# Patient Record
Sex: Female | Born: 1940 | Race: White | Hispanic: No | Smoking: Never smoker
Health system: Southern US, Community
[De-identification: ages and names within clinical notes are randomized; demographics above are authoritative.]

## PROBLEM LIST (undated history)

## (undated) DIAGNOSIS — M6289 Other specified disorders of muscle: Secondary | ICD-10-CM

## (undated) DIAGNOSIS — Z831 Family history of other infectious and parasitic diseases: Secondary | ICD-10-CM

## (undated) DIAGNOSIS — N183 Chronic kidney disease, stage 3 (moderate): Secondary | ICD-10-CM

## (undated) DIAGNOSIS — C50919 Malignant neoplasm of unspecified site of unspecified female breast: Secondary | ICD-10-CM

## (undated) DIAGNOSIS — F419 Anxiety disorder, unspecified: Secondary | ICD-10-CM

## (undated) DIAGNOSIS — R809 Proteinuria, unspecified: Secondary | ICD-10-CM

## (undated) DIAGNOSIS — I779 Disorder of arteries and arterioles, unspecified: Secondary | ICD-10-CM

## (undated) DIAGNOSIS — Z8611 Personal history of tuberculosis: Secondary | ICD-10-CM

## (undated) DIAGNOSIS — W108XXA Fall (on) (from) other stairs and steps, initial encounter: Secondary | ICD-10-CM

## (undated) DIAGNOSIS — R002 Palpitations: Secondary | ICD-10-CM

## (undated) DIAGNOSIS — M549 Dorsalgia, unspecified: Secondary | ICD-10-CM

## (undated) DIAGNOSIS — E119 Type 2 diabetes mellitus without complications: Secondary | ICD-10-CM

## (undated) DIAGNOSIS — F32A Depression, unspecified: Secondary | ICD-10-CM

## (undated) DIAGNOSIS — J984 Other disorders of lung: Secondary | ICD-10-CM

## (undated) DIAGNOSIS — R252 Cramp and spasm: Secondary | ICD-10-CM

## (undated) DIAGNOSIS — Z8619 Personal history of other infectious and parasitic diseases: Secondary | ICD-10-CM

## (undated) DIAGNOSIS — J40 Bronchitis, not specified as acute or chronic: Secondary | ICD-10-CM

## (undated) DIAGNOSIS — S82899A Other fracture of unspecified lower leg, initial encounter for closed fracture: Secondary | ICD-10-CM

## (undated) DIAGNOSIS — Z90710 Acquired absence of both cervix and uterus: Secondary | ICD-10-CM

## (undated) DIAGNOSIS — F329 Major depressive disorder, single episode, unspecified: Secondary | ICD-10-CM

## (undated) DIAGNOSIS — E785 Hyperlipidemia, unspecified: Secondary | ICD-10-CM

## (undated) DIAGNOSIS — N200 Calculus of kidney: Secondary | ICD-10-CM

## (undated) DIAGNOSIS — M25561 Pain in right knee: Secondary | ICD-10-CM

## (undated) DIAGNOSIS — I639 Cerebral infarction, unspecified: Secondary | ICD-10-CM

## (undated) DIAGNOSIS — N289 Disorder of kidney and ureter, unspecified: Secondary | ICD-10-CM

## (undated) DIAGNOSIS — Z836 Family history of other diseases of the respiratory system: Secondary | ICD-10-CM

## (undated) DIAGNOSIS — C189 Malignant neoplasm of colon, unspecified: Secondary | ICD-10-CM

## (undated) DIAGNOSIS — R0609 Other forms of dyspnea: Secondary | ICD-10-CM

## (undated) DIAGNOSIS — Z923 Personal history of irradiation: Secondary | ICD-10-CM

## (undated) DIAGNOSIS — I10 Essential (primary) hypertension: Secondary | ICD-10-CM

## (undated) DIAGNOSIS — R0600 Dyspnea, unspecified: Secondary | ICD-10-CM

## (undated) HISTORY — DX: Family history of other diseases of the respiratory system: Z83.6

## (undated) HISTORY — DX: Other fracture of unspecified lower leg, initial encounter for closed fracture: S82.899A

## (undated) HISTORY — DX: Other forms of dyspnea: R06.09

## (undated) HISTORY — DX: Acquired absence of both cervix and uterus: Z90.710

## (undated) HISTORY — DX: Disorder of kidney and ureter, unspecified: N28.9

## (undated) HISTORY — DX: Fall (on) (from) other stairs and steps, initial encounter: W10.8XXA

## (undated) HISTORY — PX: BREAST LUMPECTOMY: SHX2

## (undated) HISTORY — DX: Dyspnea, unspecified: R06.00

## (undated) HISTORY — DX: Chronic kidney disease, stage 3 (moderate): N18.3

## (undated) HISTORY — PX: TONSILLECTOMY AND ADENOIDECTOMY: SUR1326

## (undated) HISTORY — DX: Type 2 diabetes mellitus without complications: E11.9

## (undated) HISTORY — DX: Major depressive disorder, single episode, unspecified: F32.9

## (undated) HISTORY — DX: Proteinuria, unspecified: R80.9

## (undated) HISTORY — DX: Other specified disorders of muscle: M62.89

## (undated) HISTORY — DX: Malignant neoplasm of colon, unspecified: C18.9

## (undated) HISTORY — DX: Palpitations: R00.2

## (undated) HISTORY — DX: Hyperlipidemia, unspecified: E78.5

## (undated) HISTORY — DX: Personal history of tuberculosis: Z86.11

## (undated) HISTORY — DX: Cerebral infarction, unspecified: I63.9

## (undated) HISTORY — DX: Malignant neoplasm of unspecified site of unspecified female breast: C50.919

## (undated) HISTORY — DX: Other disorders of lung: J98.4

## (undated) HISTORY — DX: Essential (primary) hypertension: I10

## (undated) HISTORY — DX: Cramp and spasm: R25.2

## (undated) HISTORY — DX: Dorsalgia, unspecified: M54.9

## (undated) HISTORY — DX: Personal history of other infectious and parasitic diseases: Z86.19

## (undated) HISTORY — PX: HYSTEROTOMY: SHX1776

## (undated) HISTORY — DX: Depression, unspecified: F32.A

## (undated) HISTORY — DX: Anxiety disorder, unspecified: F41.9

## (undated) HISTORY — DX: Calculus of kidney: N20.0

## (undated) HISTORY — DX: Pain in right knee: M25.561

## (undated) HISTORY — DX: Family history of other infectious and parasitic diseases: Z83.1

## (undated) HISTORY — DX: Bronchitis, not specified as acute or chronic: J40

## (undated) NOTE — *Deleted (*Deleted)
PMR Admission Coordinator Pre-Admission Assessment  Patient: [name] is an 47 y.o., female MRN: [id number] DOB: 08-29-1940 Height:   Weight: 88.7 kg  Insurance Information HMO: ***    PPO: ***     PCP: ***     IPA: ***     80/20: ***     OTHER: *** PRIMARY: U[employer] Medicare      Policy#: [id number]      Subscriber: pt CM Name: ***      Phone#: [phone]   Fax#: [phone] Pre-Cert#: ***      Employer:  Benefits:  Phone #: [phone]     Name:  Eff. Date: ***     Deduct: ***      Out of Pocket Max: ***      Life Max: n/a CIR: ***      SNF: *** Outpatient: ***     Co-Pay: *** Home Health: ***      Co-Pay: *** DME: ***     Co-Pay: *** Providers:  SECONDARY       Policy#:      Phone#:   Development worker, community:       Phone#:   The "Data Collection Information Summary" for patients in Inpatient Rehabilitation Facilities with attached "Privacy Act [place] Records" was provided and verbally reviewed with: Patient  Emergency Contact Information Contact Information    Name Relation Home Work Mobile   [name] Son [phone]        Current Medical History  Patient Admitting Diagnosis: encephalopathy   History of Present Illness: Pt is a 53 y/o female with PMH significant for DM II, HTN, CKD stage IV, CVA, depression/anxiety who was admitted to [facility] on 10/1 with acute encephalopathy, SIRS, AKI on CKD IV, and cholelithiasis.  With was started on broad-spectrum antibiotics while workup for infection underway.  On presentation, creatinine was 5.3 (baseline 1.97), CT head negative, CT chest revealed 4cm ascending aortic aneurysm and 2.4 cm L thyroid nodule.  RUQ ultrasound showed cholelithiasis with pericholecystic fluid, but no evidence on cholecystitis.  Nephrology consulted renal management, and cardiology consulted for PVCs versus non-sustained VT, felt related to her metabolic derangements.  Therapy evaluations were completed and pt was recommended for CIR.      Patient's medical record from [facility] has been reviewed by the rehabilitation admission coordinator and physician.  Past Medical History  Past Medical History:  Diagnosis Date  . Anxiety   . Back ache   . Breast cancer ([place])    right  . Bronchitis   . Cancer of colon ([place])   . Carotid arterial disease ([place])    a. 02/2018 Carotid U/S: 1-39% bilat ICA stenosis.  . Chronic Dyspnea on exertion    a. 08/2017 MV: EF 62%, mid ant/apical ant defect. No ischemia-->low risk; b. 02/2018 Echo: EF 60-65%, no rwma, Gr1 DD. Mild AI. Asc Ao 22mm. Triv MR/TR/PR. Nl RV fxn.  . CKD (chronic kidney disease), stage IV ([place]) 2017  . Depression   . DM (diabetes mellitus) ([place])   . Family history of primary TB   . Fracture of ankle, closed 2014   left  . History of chicken pox   . History of hysterectomy 04/1980  . History of primary TB   . Hyperlipidemia   . Hypertension   . Knee pain, right   . Lung abnormality    age 32 patient had pna spot on lungs   . Microalbuminuria   . Muscle cramps    both  legs  . Muscular degeneration    of right eye  . Nephrolithiasis   . Palpitations    a. 2007 s/p catheter ablation for tachycardia, though pt is not aware of details.  . Personal history of radiation therapy   . Stroke [facility])    a. 02/2018 non hemorrhagic 30mm R paramedian pontine infarct. Chronic microvascular ischemia.    Family History   family history includes Asthma (age of onset: 16) in her brother; Cancer in her father; Kidney Stones in her brother; Peripheral Artery Disease (age of onset: 57) in her mother.  Prior Rehab/Hospitalizations Has the patient had prior rehab or hospitalizations prior to admission? No  Has the patient had major surgery during 100 days prior to admission? No   Current Medications  Current Facility-Administered Medications:  .  0.9 %  sodium chloride infusion (Manually program via Guardrails IV Fluids), , Intravenous, Once, Chotiner, Yevonne Aline, MD .   acetaminophen (TYLENOL) tablet 650 mg, 650 mg, Oral, Q6H PRN, 650 mg at 04/30/20 1304 **OR** acetaminophen (TYLENOL) suppository 650 mg, 650 mg, Rectal, Q6H PRN, Zada Finders R, MD .  acetaminophen (TYLENOL) tablet 650 mg, 650 mg, Oral, Once, Chotiner, Yevonne Aline, MD .  ARIPiprazole (ABILIFY) tablet 5 mg, 5 mg, Oral, Daily, Zada Finders R, MD, 5 mg at 04/30/20 0930 .  atorvastatin (LIPITOR) tablet 80 mg, 80 mg, Oral, q1800, Lenore Cordia, MD, 80 mg at 04/29/20 1657 .  calcium carbonate (OS-CAL - dosed in mg of elemental calcium) tablet 1,000 mg of elemental calcium, 1,000 mg of elemental calcium, Oral, QHS, Elmarie Shiley, MD, 1,000 mg of elemental calcium at 04/29/20 2201 .  clopidogrel (PLAVIX) tablet 75 mg, 75 mg, Oral, Daily, Zada Finders R, MD, 75 mg at 04/30/20 0930 .  heparin injection 5,000 Units, 5,000 Units, Subcutaneous, Q8H, Lenore Cordia, MD, 5,000 Units at 04/30/20 0501 .  insulin aspart (novoLOG) injection 0-9 Units, 0-9 Units, Subcutaneous, TID WC, Lenore Cordia, MD, 2 Units at 04/30/20 1231 .  LORazepam (ATIVAN) tablet 0.5 mg, 0.5 mg, Oral, Q6H PRN, Zada Finders R, MD, 0.5 mg at 04/30/20 1132 .  mirtazapine (REMERON) tablet 15 mg, 15 mg, Oral, QHS, Zada Finders R, MD, 15 mg at 04/29/20 2201 .  ondansetron (ZOFRAN) tablet 4 mg, 4 mg, Oral, Q6H PRN **OR** ondansetron (ZOFRAN) injection 4 mg, 4 mg, Intravenous, Q6H PRN, Posey Pronto, Vishal R, MD .  sodium bicarbonate tablet 1,300 mg, 1,300 mg, Oral, TID, Justin Mend, MD, 1,300 mg at 04/30/20 0929 .  venlafaxine XR (EFFEXOR-XR) 24 hr capsule 150 mg, 150 mg, Oral, Daily, Alekh, Kshitiz, MD, 150 mg at 04/30/20 1133  Patients Current Diet:  Diet Order            Diet renal/carb modified with fluid restriction Diet-HS Snack? Nothing; Fluid restriction: 1200 mL Fluid; Room service appropriate? Yes; Fluid consistency: Thin  Diet effective now                 Precautions / Restrictions Precautions Precautions: Fall Precaution  Comments: monitor O2 and pulses Restrictions Weight Bearing Restrictions: No   Has the patient had 2 or more falls or a fall with injury in the past year? Yes  Prior Activity Level Limited Community (1-2x/wk): was still driving, doing her own grocery shopping, using RW for mobility  Prior Functional Level Self Care: Did the patient need help bathing, dressing, using the toilet or eating? Independent  Indoor Mobility: Did the patient need assistance with walking from room  to room (with or without device)? Independent  Stairs: Did the patient need assistance with internal or external stairs (with or without device)? Independent  Functional Cognition: Did the patient need help planning regular tasks such as shopping or remembering to take medications? Independent  Home Assistive Devices / Equipment Home Equipment: Cane - single point, Grab bars - tub/shower, Hand held shower head, Shower seat, Grab bars - toilet  Prior Device Use: Indicate devices/aids used by the patient prior to current illness, exacerbation or injury? Walker  Current Functional Level Cognition  Overall Cognitive Status: Within Functional Limits for tasks assessed Orientation Level: Oriented to person, Oriented to place, Oriented to time, Disoriented to situation General Comments: pt aware she is confused but continues to demonstrate poor safety awareness and awareness of deficits    Extremity Assessment (includes Sensation/Coordination)  Upper Extremity Assessment: Overall WFL for tasks assessed  Lower Extremity Assessment: Generalized weakness    ADLs  Overall ADL's : Needs assistance/impaired Eating/Feeding: Independent, Sitting Grooming: Set up, Sitting Upper Body Bathing: Set up, Sitting Lower Body Bathing: Min guard, Sit to/from stand Upper Body Dressing : Set up, Sitting Lower Body Dressing: Min guard, Sit to/from stand Toilet Transfer: Minimal assistance, Ambulation Toilet Transfer Details (indicate  cue type and reason): No AD Toileting- Clothing Manipulation and Hygiene: Maximal assistance Toileting - Clothing Manipulation Details (indicate cue type and reason): due to incontience, Min guard A sit<>stand    Mobility  Overal bed mobility: Needs Assistance Bed Mobility: Supine to Sit, Sit to Supine Rolling: Mod assist Supine to sit: Min assist, HOB elevated Sit to supine: Min assist, HOB elevated General bed mobility comments: used bed rail to support OOB    Transfers  Overall transfer level: Needs assistance Equipment used: Rolling walker (2 wheeled) Transfers: Sit to/from Stand Sit to Stand: Min assist, Mod assist Stand pivot transfers: Min assist General transfer comment: pt performed sit<>stand x 3 trials; 2 trials min A and 1 trial mod A    Ambulation / Gait / Stairs / Wheelchair Mobility  Ambulation/Gait Ambulation/Gait assistance: Counsellor (Feet): 25 Feet Assistive device: Rolling walker (2 wheeled) Gait Pattern/deviations: Step-through pattern, Decreased stride length General Gait Details: Pt was able to walk with min guard on RW for three trips of 12' Gait velocity: reduced Gait velocity interpretation: <1.31 ft/sec, indicative of household ambulator    Posture / Balance Balance Overall balance assessment: Needs assistance Sitting-balance support: Feet supported Sitting balance-Leahy Scale: Good Standing balance support: Bilateral upper extremity supported Standing balance-Leahy Scale: Fair Standing balance comment: increased weight bearing through B UEs on RW during all standing activities    Special needs/care consideration Diabetic management yes   Previous Home Environment (from acute therapy documentation) Living Arrangements: Alone Available Help at Discharge: Family, Available PRN/intermittently Bathroom Shower/Tub: Tub/shower unit, Architectural technologist: Standard Additional Comments: no family are present to help determine   Discharge Living Setting Plans for Discharge Living Setting: Alone Type of Home at Discharge: Apartment Discharge Home Layout: One level Discharge Home Access: Stairs to enter Entrance Stairs-Rails: Right, Left (per pt, son installed rails) Technical brewer of Steps: 1+2+1 Discharge Bathroom Shower/Tub: Tub/shower unit Discharge Bathroom Toilet: Standard Discharge Bathroom Accessibility: Yes How Accessible: Accessible via walker Does the patient have any problems obtaining your medications?: No  Social/Family/Support Systems Anticipated Caregiver: Dr. Phylliss Bob is contact (son) Anticipated Caregiver's Contact Information: [phone] Ability/Limitations of Caregiver: n/a Caregiver Availability: Intermittent Discharge Plan Discussed with Primary Caregiver: Yes Is Caregiver In Agreement with Plan?:  Yes Does Caregiver/Family have Issues with Lodging/Transportation while Pt is in Rehab?: No  Goals Patient/Family Goal for Rehab: PT/OT supervision to mod I Expected length of stay: 10-12 days Additional Information: potential for ALF down the road, family looking into getting more help at home.  Pt/Family Agrees to Admission and willing to participate: Yes Program Orientation Provided & Reviewed with Pt/Caregiver Including Roles  & Responsibilities: Yes  Barriers to Discharge: Insurance for SNF coverage, Decreased caregiver support  Decrease burden of Care through IP rehab admission: n/a  Possible need for SNF placement upon discharge: Not anticipated  Patient Condition: I have reviewed medical records from [facility], spoken with CM, and patient and son. I met with patient at the bedside and discussed via phone for inpatient rehabilitation assessment.  Patient will benefit from ongoing PT and OT, can actively participate in 3 hours of therapy a day 5 days of the week, and can make measurable gains during the admission.  Patient will also benefit from the coordinated  team approach during an Inpatient Acute Rehabilitation admission.  The patient will receive intensive therapy as well as Rehabilitation physician, nursing, social worker, and care management interventions.  Due to safety, skin/wound care, disease management, medication administration, pain management and patient education the patient requires 24 hour a day rehabilitation nursing.  The patient is currently *** with mobility and basic ADLs.  Discharge setting and therapy post discharge at home with home health is anticipated.  Patient has agreed to participate in the Acute Inpatient Rehabilitation Program and will admit {Time; today/tomorrow:10263}.  Preadmission Screen Completed By: Shann Medal, PT, DPT with updates by Michel Santee, 04/30/2020 2:13 PM ______________________________________________________________________   Discussed status with Dr. Marland Kitchen on *** at *** and received approval for admission today.  Admission Coordinator: Shann Medal, PT, DPT; Michel Santee, PT, time Marland KitchenSudie Grumbling ***   Assessment/Plan: Diagnosis: 1. Does the need for close, 24 hr/day Medical supervision in concert with the patient's rehab needs make it unreasonable for this patient to be served in a less intensive setting? {yes_no_potentially:3041433} 2. Co-Morbidities requiring supervision/potential complications: *** 3. Due to {due RE:257123, does the patient require 24 hr/day rehab nursing? {yes_no_potentially:3041433} 4. Does the patient require coordinated care of a physician, rehab nurse, PT, OT, and SLP to address physical and functional deficits in the context of the above medical diagnosis(es)? {yes_no_potentially:3041433} Addressing deficits in the following areas: {deficits:3041436} 5. Can the patient actively participate in an intensive therapy program of at least 3 hrs of therapy 5 days a week? {yes_no_potentially:3041433} 6. The potential for patient to make measurable gains while on inpatient rehab  is {potential:3041437} 7. Anticipated functional outcomes upon discharge from inpatient rehab: {functional outcomes:304600100} PT, {functional outcomes:304600100} OT, {functional outcomes:304600100} SLP 8. Estimated rehab length of stay to reach the above functional goals is: *** 9. Anticipated discharge destination: {anticipated dc setting:21604} 10. Overall Rehab/Functional Prognosis: {potential:3041437}   MD Signature: ***

## (undated) NOTE — *Deleted (*Deleted)
Transition of Care [facility]) - Progression Note    Patient Details  Name: [name] MRN: [id number] Date of Birth: [birth year]  Transition of Care [facility]) CM/SW [place], [place] Phone Number: [phone] 05/04/2020, 11:27 AM  Clinical Narrative:     CSW provided patient with her current 4 bed offers. CSW highlighted the bed offers on the medicare.gov rating list for patient.Patient informed CSW that she wanted to discuss the offers with her son. CSW informed her that the weekday CSW would follow up for her choice tomorrow.  CSW uploaded the 30 day note to [place].   Expected Discharge Plan: Skilled Nursing Facility Barriers to Discharge: Continued Medical Work up  Expected Discharge Plan and Services Expected Discharge Plan: [place]                                               Social Determinants of Health (SDOH) Interventions    Readmission Risk Interventions No flowsheet data found.

## (undated) NOTE — *Deleted (*Deleted)
Physical Medicine and Rehabilitation Admission H&P    Chief Complaint  Patient presents with  . Altered Mental Status  : HPI: [name] is a 59 year old right-handed female with history of anxiety, diabetes mellitus with peripheral neuropathy, CKD stage III with baseline creatinine 2.0-2.4, history of breast cancer with lumpectomy, hyperlipidemia hypertension.  Patient well-known to rehab services 03/28/2018 to 04/12/2018 for right paramedian pontine infarction and discharged to home ambulating rolling walker and supervision.  Per chart review patient lives alone and was driving.  Son and daughter-in-law in the area with good support.  Presented 04/25/2020 with altered mental status and generalized weakness with intermittent nausea.  Cranial CT scan negative for acute changes.  CT of the chest no evidence of pneumonia.  There was fusiform dilatation of the ascending aorta maximal dimension 4 cm recommending annual imaging with CTA.  Ultrasound of the abdomen showed cholelithiasis and a mild amount of pericholecystic fluid without evidence of acute cholecystitis.  Incidental findings of 4.0 cm x 3.4 cm x 3.8 complex hypoechoic and anechoic right renal mass that had been noted on prior scans with recommendations of yearly imaging.  Admission chemistries SARS coronavirus negative, creatinine 5.52 BUN 93 glucose 126 AST 65 ALT 49, WBC 16,500, hemoglobin 9.5, blood cultures no growth to date, lactic acid 1.2, urine culture no growth.  Nephrology services consulted for AKI on CKD felt to be related to volume depletion as well as ATN.  Placed on gentle IV fluids and creatinine much improved 2.58 and renal services have signed off.  Maintained on subcutaneous heparin for DVT prophylaxis.  She continues on Plavix for history of CVA.  Tolerating a regular consistency diet.  Therapy evaluations completed and patient was admitted for a comprehensive rehab program.  Review of Systems  Constitutional: Negative  for chills and fever.  HENT: Negative for hearing loss.   Eyes: Negative for blurred vision and double vision.  Respiratory: Negative for cough.        Chronic dyspnea on exertion  Cardiovascular: Positive for leg swelling. Negative for chest pain and palpitations.  Gastrointestinal: Positive for nausea and vomiting. Negative for abdominal pain and blood in stool.  Genitourinary: Negative for dysuria, flank pain and hematuria.  Musculoskeletal: Positive for back pain and myalgias.  Skin: Negative for rash.  Psychiatric/Behavioral: Positive for depression. The patient has insomnia.        Anxiety  All other systems reviewed and are negative.  Past Medical History:  Diagnosis Date  . Anxiety   . Back ache   . Breast cancer ([place])    right  . Bronchitis   . Cancer of colon ([place])   . Carotid arterial disease ([place])    a. 02/2018 Carotid U/S: 1-39% bilat ICA stenosis.  . Chronic Dyspnea on exertion    a. 08/2017 MV: EF 62%, mid ant/apical ant defect. No ischemia-->low risk; b. 02/2018 Echo: EF 60-65%, no rwma, Gr1 DD. Mild AI. Asc Ao 18mm. Triv MR/TR/PR. Nl RV fxn.  . CKD (chronic kidney disease), stage IV ([place]) 2017  . Depression   . DM (diabetes mellitus) ([place])   . Family history of primary TB   . Fracture of ankle, closed 2014   left  . History of chicken pox   . History of hysterectomy 04/1980  . History of primary TB   . Hyperlipidemia   . Hypertension   . Knee pain, right   . Lung abnormality    age 35 patient had pna spot on lungs   .  Microalbuminuria   . Muscle cramps    both legs  . Muscular degeneration    of right eye  . Nephrolithiasis   . Palpitations    a. 2007 s/p catheter ablation for tachycardia, though pt is not aware of details.  . Personal history of radiation therapy   . Stroke [facility])    a. 02/2018 non hemorrhagic 78mm R paramedian pontine infarct. Chronic microvascular ischemia.   Past Surgical History:  Procedure Laterality Date  . ABLATION  2007    fast heart rate  . ANKLE FRACTURE SURGERY Left 2014  . BREAST LUMPECTOMY Right   . CATARACT EXTRACTION Right 2017  . colonscopy  2015  . HYSTEROTOMY     45 years ago  . TONSILLECTOMY AND ADENOIDECTOMY     age 13   Family History  Problem Relation Age of Onset  . Peripheral Artery Disease Mother 74       HX OF POOR CIRCULATION, SMOKING, LEG AMPUTATION  . Cancer Father        cancer of spine  . Asthma Brother 58  . Kidney Stones Brother   . Breast cancer Neg Hx   . Colon cancer Neg Hx   . Colon polyps Neg Hx    Social History:  reports that she has never smoked. She has never used smokeless tobacco. She reports that she does not drink alcohol and does not use drugs. Allergies: No Known Allergies Medications Prior to Admission  Medication Sig Dispense Refill  . acetaminophen (TYLENOL) 325 MG tablet Take 2 tablets (650 mg total) by mouth every 4 (four) hours as needed for mild pain (or temp > 37.5 C (99.5 F)).    Marland Kitchen ARIPiprazole (ABILIFY) 5 MG tablet Take 1 tablet (5 mg total) by mouth daily. 30 tablet 3  . atorvastatin (LIPITOR) 80 MG tablet Take 1 tablet (80 mg total) by mouth daily at 6 PM. 30 tablet 0  . BD PEN NEEDLE NANO 2ND GEN 32G X 4 MM MISC 1 each by Other route See admin instructions. Use with [place] daily.    . clopidogrel (PLAVIX) 75 MG tablet Take 1 tablet (75 mg total) by mouth daily. 30 tablet 0  . docusate sodium (COLACE) 100 MG capsule Take 100 mg by mouth 2 (two) times daily.    . insulin lispro protamine-lispro (HUMALOG 75/25 MIX) (75-25) 100 UNIT/ML SUSP injection Inject 44 Units into the skin 2 (two) times daily with a meal.     . lisinopril (PRINIVIL,ZESTRIL) 10 MG tablet Take 10 mg by mouth daily.    Marland Kitchen LORazepam (ATIVAN) 0.5 MG tablet Take 1 tablet (0.5 mg total) by mouth 4 (four) times daily. 60 tablet 0  . mirtazapine (REMERON) 15 MG tablet Take 1 tablet (15 mg total) by mouth at bedtime. 30 tablet 1  . Multiple Vitamin (MULTIVITAMIN) capsule Take 1 capsule by  mouth daily.    . multivitamin-lutein (OCUVITE-LUTEIN) CAPS capsule Take 2 capsules by mouth daily.    Marland Kitchen omega-3 acid ethyl esters (LOVAZA) 1 g capsule Take 1 capsule (1 g total) by mouth 2 (two) times daily. (Patient taking differently: Take 2 g by mouth 2 (two) times daily. ) 60 capsule 0  . ONETOUCH VERIO test strip 1 each by Other route See admin instructions. Use 1 strip to test blood glucose twice daily.    . sennosides-docusate sodium (SENOKOT-S) 8.6-50 MG tablet Take 1 tablet by mouth daily.    Marland Kitchen venlafaxine XR (EFFEXOR-XR) 150 MG 24 hr capsule Take  1 capsule by mouth daily.  3  . furosemide (LASIX) 20 MG tablet Take 20 mg by mouth every morning.       Drug Regimen Review Drug regimen was reviewed and remains appropriate with no significant issues identified  Home: Home Living Family/patient expects to be discharged to:: Skilled nursing facility Living Arrangements: Alone Available Help at Discharge: Family, Available PRN/intermittently Bathroom Shower/Tub: Tub/shower unit, Architectural technologist: Standard Home Equipment: [employer] - single point, Grab bars - tub/shower, Hand held shower head, Shower seat, Grab bars - toilet Additional Comments: no family are present to help determine   Functional History: Prior Function Level of Independence: Independent with assistive device(s) Gait / Transfers Assistance Needed: Independent in house, used [facility] outside of house ADL's / [place] Needed: Independent/Mod I Comments: driving, doing her own basic ADLs and some IADLs, 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  Functional Status:  Mobility: Bed Mobility Overal bed mobility: Needs Assistance Bed Mobility: Supine to Sit, Sit to Supine Rolling: Mod assist Supine to sit: Min assist, HOB elevated Sit to supine: Min assist, HOB elevated General bed mobility comments: used bed rail to support OOB Transfers Overall transfer level: Needs assistance Equipment used: Rolling  walker (2 wheeled) Transfers: Sit to/from Stand Sit to Stand: Min assist, Mod assist Stand pivot transfers: Min assist General transfer comment: pt performed sit<>stand x 3 trials; 2 trials min A and 1 trial mod A Ambulation/Gait Ambulation/Gait assistance: Min guard Gait Distance (Feet): 25 Feet Assistive device: Rolling walker (2 wheeled) Gait Pattern/deviations: Step-through pattern, Decreased stride length General Gait Details: Pt was able to walk with min guard on RW for three trips of 12' Gait velocity: reduced Gait velocity interpretation: <1.31 ft/sec, indicative of household ambulator    ADL: ADL Overall ADL's : Needs assistance/impaired Eating/Feeding: Independent, Sitting Grooming: Set up, Sitting Upper Body Bathing: Set up, Sitting Lower Body Bathing: Min guard, Sit to/from stand Upper Body Dressing : Set up, Sitting Lower Body Dressing: Min guard, Sit to/from stand Toilet Transfer: Minimal assistance, Ambulation Toilet Transfer Details (indicate cue type and reason): No AD Toileting- Clothing Manipulation and Hygiene: Maximal assistance Toileting - Clothing Manipulation Details (indicate cue type and reason): due to incontience, Min guard A sit<>stand  Cognition: Cognition Overall Cognitive Status: Within Functional Limits for tasks assessed Orientation Level: Oriented to person, Oriented to place, Oriented to time, Disoriented to situation Cognition Arousal/Alertness: Awake/alert Behavior During Therapy: WFL for tasks assessed/performed Overall Cognitive Status: Within Functional Limits for tasks assessed General Comments: pt aware she is confused but continues to demonstrate poor safety awareness and awareness of deficits  Physical Exam: Blood pressure 136/83, pulse 93, temperature 98.2 F (36.8 C), temperature source Oral, resp. rate 18, weight 99.3 kg, SpO2 95 %. Physical Exam Neurological:     Comments: Patient is alert in no acute distress.  Makes eye  contact with examiner.  Follows simple commands.  She provides her name age and date of birth.  She was able provide the name of the hospital.  She is a limited medical historian.  She cannot recall full events of her latest rehab admission after CVA.     Results for orders placed or performed during the hospital encounter of 04/25/20 (from the past 48 hour(s))  Glucose, capillary     Status: Abnormal   Collection Time: 04/29/20 11:15 AM  Result Value Ref Range   Glucose-Capillary 224 (H) 70 - 99 mg/dL    Comment: Glucose reference range applies only to  samples taken after fasting for at least 8 hours.  Glucose, capillary     Status: Abnormal   Collection Time: 04/29/20  4:38 PM  Result Value Ref Range   Glucose-Capillary 145 (H) 70 - 99 mg/dL    Comment: Glucose reference range applies only to samples taken after fasting for at least 8 hours.  Glucose, capillary     Status: Abnormal   Collection Time: 04/29/20  9:55 PM  Result Value Ref Range   Glucose-Capillary 150 (H) 70 - 99 mg/dL    Comment: Glucose reference range applies only to samples taken after fasting for at least 8 hours.   Comment 1 Notify RN   CBC with Differential/Platelet     Status: Abnormal   Collection Time: 04/30/20  3:38 AM  Result Value Ref Range   WBC 8.7 4.0 - 10.5 K/uL   RBC 2.51 (L) 3.87 - 5.11 MIL/uL   Hemoglobin 8.2 (L) 12.0 - 15.0 g/dL   HCT 24.8 (L) 36 - 46 %   MCV 98.8 80.0 - 100.0 fL   MCH 32.7 26.0 - 34.0 pg   MCHC 33.1 30.0 - 36.0 g/dL   RDW 14.0 11.5 - 15.5 %   Platelets 346 150 - 400 K/uL   nRBC 0.0 0.0 - 0.2 %   Neutrophils Relative % 64 %   Neutro Abs 5.8 1.7 - 7.7 K/uL   Lymphocytes Relative 15 %   Lymphs Abs 1.3 0.7 - 4.0 K/uL   Monocytes Relative 12 %   Monocytes Absolute 1.0 0 - 1 K/uL   Eosinophils Relative 3 %   Eosinophils Absolute 0.2 0 - 0 K/uL   Basophils Relative 1 %   Basophils Absolute 0.0 0 - 0 K/uL   Immature Granulocytes 5 %   Abs Immature Granulocytes 0.40 (H) 0.00 -  0.07 K/uL    Comment: Performed at [facility], [address], [place], [place] [id number]  Basic metabolic panel     Status: Abnormal   Collection Time: 04/30/20  3:38 AM  Result Value Ref Range   Sodium 143 135 - 145 mmol/L   Potassium 4.4 3.5 - 5.1 mmol/L    Comment: HEMOLYSIS AT THIS LEVEL MAY AFFECT RESULT   Chloride 109 98 - 111 mmol/L   CO2 20 (L) 22 - 32 mmol/L   Glucose, Bld 149 (H) 70 - 99 mg/dL    Comment: Glucose reference range applies only to samples taken after fasting for at least 8 hours.   BUN 62 (H) 8 - 23 mg/dL   Creatinine, Ser 2.83 (H) 0.44 - 1.00 mg/dL   Calcium 8.7 (L) 8.9 - 10.3 mg/dL   GFR calc non Af Amer 15 (L) >60 mL/min   Anion gap 14 5 - 15    Comment: Performed at [facility] [address] [place], [place] [address]  Magnesium     Status: None   Collection Time: 04/30/20  3:38 AM  Result Value Ref Range   Magnesium 1.7 1.7 - 2.4 mg/dL    Comment: Performed at [facility] [address] [place], [place] [address]  Glucose, capillary     Status: Abnormal   Collection Time: 04/30/20  8:04 AM  Result Value Ref Range   Glucose-Capillary 163 (H) 70 - 99 mg/dL    Comment: Glucose reference range applies only to samples taken after fasting for at least 8 hours.  Glucose, capillary     Status: Abnormal  Collection Time: 04/30/20 11:20 AM  Result Value Ref Range   Glucose-Capillary 184 (H) 70 - 99 mg/dL    Comment: Glucose reference range applies only to samples taken after fasting for at least 8 hours.  Glucose, capillary     Status: Abnormal   Collection Time: 04/30/20  4:48 PM  Result Value Ref Range   Glucose-Capillary 125 (H) 70 - 99 mg/dL    Comment: Glucose reference range applies only to samples taken after fasting for at least 8 hours.  Glucose, capillary     Status: Abnormal   Collection Time: 04/30/20  9:24 PM  Result Value Ref Range   Glucose-Capillary 128 (H) 70 - 99 mg/dL    Comment: Glucose reference range  applies only to samples taken after fasting for at least 8 hours.  CBC with Differential/Platelet     Status: Abnormal   Collection Time: 05/01/20  4:45 AM  Result Value Ref Range   WBC 8.5 4.0 - 10.5 K/uL   RBC 2.46 (L) 3.87 - 5.11 MIL/uL   Hemoglobin 7.8 (L) 12.0 - 15.0 g/dL   HCT 24.6 (L) 36 - 46 %   MCV 100.0 80.0 - 100.0 fL   MCH 31.7 26.0 - 34.0 pg   MCHC 31.7 30.0 - 36.0 g/dL   RDW 13.8 11.5 - 15.5 %   Platelets 334 150 - 400 K/uL   nRBC 0.0 0.0 - 0.2 %   Neutrophils Relative % 72 %   Neutro Abs 6.1 1.7 - 7.7 K/uL   Lymphocytes Relative 12 %   Lymphs Abs 1.0 0.7 - 4.0 K/uL   Monocytes Relative 9 %   Monocytes Absolute 0.8 0 - 1 K/uL   Eosinophils Relative 2 %   Eosinophils Absolute 0.2 0 - 0 K/uL   Basophils Relative 0 %   Basophils Absolute 0.0 0 - 0 K/uL   Immature Granulocytes 5 %   Abs Immature Granulocytes 0.41 (H) 0.00 - 0.07 K/uL    Comment: Performed at [facility], [address], [place], [place] [id number]  Basic metabolic panel     Status: Abnormal   Collection Time: 05/01/20  4:45 AM  Result Value Ref Range   Sodium 141 135 - 145 mmol/L   Potassium 3.8 3.5 - 5.1 mmol/L   Chloride 107 98 - 111 mmol/L   CO2 23 22 - 32 mmol/L   Glucose, Bld 130 (H) 70 - 99 mg/dL    Comment: Glucose reference range applies only to samples taken after fasting for at least 8 hours.   BUN 49 (H) 8 - 23 mg/dL   Creatinine, Ser 2.58 (H) 0.44 - 1.00 mg/dL   Calcium 8.7 (L) 8.9 - 10.3 mg/dL   GFR calc non Af Amer 17 (L) >60 mL/min   Anion gap 11 5 - 15    Comment: Performed at [facility] [address], [place], [place] [address]  Magnesium     Status: None   Collection Time: 05/01/20  4:45 AM  Result Value Ref Range   Magnesium 1.8 1.7 - 2.4 mg/dL    Comment: Performed at [facility] [address] [place],  [address]   No results found.     Medical Problem List and Plan: 1.  Altered mental status with decreased functional mobility  secondary to acute metabolic encephalopathy  -patient may *** shower  -ELOS/Goals: *** 2.  Antithrombotics: -DVT/anticoagulation: Subcutaneous heparin  -antiplatelet therapy: Plavix 75 mg daily 3.  Pain Management: Tylenol as needed 4. Mood: Effexor 150 mg daily, Remeron 15 mg nightly, Ativan as needed  -antipsychotic agents: Abilify 5 mg daily 5. Neuropsych: This patient is capable of making decisions on her own behalf. 6. Skin/Wound Care: Routine skin checks 7. Fluids/Electrolytes/Nutrition: Routine in and outs with follow-up chemistries 8.  AKI on CKD.  Baseline creatinine 2.0-2.4.  Renal function improving.  Continue sodium bicarbonate 1300 mg 3 times daily.  Follow-up renal services as needed 9.  Diabetes mellitus with peripheral neuropathy.  Hemoglobin A1c 7.8.  SSI.  Patient on Humalog 75/25 45 units twice daily prior to admission.  Resume as needed 10.  Hyperlipidemia.  Lipitor 11.  Chronic anemia.  Follow-up CBC 12.  Hypertension.  Currently on no antihypertensive medications.  Patient on lisinopril 10 mg daily prior to admission Lasix 20 mg daily. ***  Lavon Paganini Ellory Khurana, PA-C 05/01/2020

---

## 1980-04-25 DIAGNOSIS — Z90710 Acquired absence of both cervix and uterus: Secondary | ICD-10-CM

## 1980-04-25 HISTORY — DX: Acquired absence of both cervix and uterus: Z90.710

## 2005-07-26 HISTORY — PX: ABLATION: SHX5711

## 2012-07-26 DIAGNOSIS — S82899A Other fracture of unspecified lower leg, initial encounter for closed fracture: Secondary | ICD-10-CM

## 2012-07-26 HISTORY — PX: ANKLE FRACTURE SURGERY: SHX122

## 2012-07-26 HISTORY — DX: Other fracture of unspecified lower leg, initial encounter for closed fracture: S82.899A

## 2013-07-26 HISTORY — PX: OTHER SURGICAL HISTORY: SHX169

## 2014-08-19 DIAGNOSIS — F411 Generalized anxiety disorder: Secondary | ICD-10-CM | POA: Diagnosis not present

## 2014-08-19 DIAGNOSIS — Z23 Encounter for immunization: Secondary | ICD-10-CM | POA: Diagnosis not present

## 2014-08-19 DIAGNOSIS — E785 Hyperlipidemia, unspecified: Secondary | ICD-10-CM | POA: Diagnosis not present

## 2014-08-19 DIAGNOSIS — I10 Essential (primary) hypertension: Secondary | ICD-10-CM | POA: Diagnosis not present

## 2014-09-13 DIAGNOSIS — Z23 Encounter for immunization: Secondary | ICD-10-CM | POA: Diagnosis not present

## 2014-10-30 DIAGNOSIS — E785 Hyperlipidemia, unspecified: Secondary | ICD-10-CM | POA: Diagnosis not present

## 2014-10-30 DIAGNOSIS — Z23 Encounter for immunization: Secondary | ICD-10-CM | POA: Diagnosis not present

## 2014-10-30 DIAGNOSIS — F411 Generalized anxiety disorder: Secondary | ICD-10-CM | POA: Diagnosis not present

## 2014-10-30 DIAGNOSIS — I10 Essential (primary) hypertension: Secondary | ICD-10-CM | POA: Diagnosis not present

## 2014-11-06 DIAGNOSIS — N189 Chronic kidney disease, unspecified: Secondary | ICD-10-CM | POA: Diagnosis not present

## 2014-11-06 DIAGNOSIS — I10 Essential (primary) hypertension: Secondary | ICD-10-CM | POA: Diagnosis not present

## 2014-11-06 DIAGNOSIS — E118 Type 2 diabetes mellitus with unspecified complications: Secondary | ICD-10-CM | POA: Diagnosis not present

## 2014-11-06 DIAGNOSIS — F411 Generalized anxiety disorder: Secondary | ICD-10-CM | POA: Diagnosis not present

## 2014-11-06 DIAGNOSIS — E785 Hyperlipidemia, unspecified: Secondary | ICD-10-CM | POA: Diagnosis not present

## 2014-11-12 DIAGNOSIS — E041 Nontoxic single thyroid nodule: Secondary | ICD-10-CM | POA: Diagnosis not present

## 2014-11-12 DIAGNOSIS — E118 Type 2 diabetes mellitus with unspecified complications: Secondary | ICD-10-CM | POA: Diagnosis not present

## 2014-11-12 DIAGNOSIS — F411 Generalized anxiety disorder: Secondary | ICD-10-CM | POA: Diagnosis not present

## 2014-11-12 DIAGNOSIS — E785 Hyperlipidemia, unspecified: Secondary | ICD-10-CM | POA: Diagnosis not present

## 2014-11-20 DIAGNOSIS — E042 Nontoxic multinodular goiter: Secondary | ICD-10-CM | POA: Diagnosis not present

## 2014-11-20 DIAGNOSIS — E041 Nontoxic single thyroid nodule: Secondary | ICD-10-CM | POA: Diagnosis not present

## 2014-12-15 DIAGNOSIS — Z23 Encounter for immunization: Secondary | ICD-10-CM | POA: Diagnosis not present

## 2014-12-27 DIAGNOSIS — I10 Essential (primary) hypertension: Secondary | ICD-10-CM | POA: Diagnosis not present

## 2014-12-27 DIAGNOSIS — E785 Hyperlipidemia, unspecified: Secondary | ICD-10-CM | POA: Diagnosis not present

## 2014-12-27 DIAGNOSIS — F411 Generalized anxiety disorder: Secondary | ICD-10-CM | POA: Diagnosis not present

## 2014-12-27 DIAGNOSIS — I151 Hypertension secondary to other renal disorders: Secondary | ICD-10-CM | POA: Diagnosis not present

## 2014-12-27 DIAGNOSIS — N183 Chronic kidney disease, stage 3 (moderate): Secondary | ICD-10-CM | POA: Diagnosis not present

## 2014-12-27 DIAGNOSIS — E118 Type 2 diabetes mellitus with unspecified complications: Secondary | ICD-10-CM | POA: Diagnosis not present

## 2014-12-27 DIAGNOSIS — N189 Chronic kidney disease, unspecified: Secondary | ICD-10-CM | POA: Diagnosis not present

## 2015-01-08 DIAGNOSIS — E785 Hyperlipidemia, unspecified: Secondary | ICD-10-CM | POA: Diagnosis not present

## 2015-01-08 DIAGNOSIS — N189 Chronic kidney disease, unspecified: Secondary | ICD-10-CM | POA: Diagnosis not present

## 2015-01-08 DIAGNOSIS — I10 Essential (primary) hypertension: Secondary | ICD-10-CM | POA: Diagnosis not present

## 2015-01-08 DIAGNOSIS — E118 Type 2 diabetes mellitus with unspecified complications: Secondary | ICD-10-CM | POA: Diagnosis not present

## 2015-02-17 DIAGNOSIS — E1129 Type 2 diabetes mellitus with other diabetic kidney complication: Secondary | ICD-10-CM | POA: Diagnosis not present

## 2015-02-17 DIAGNOSIS — R801 Persistent proteinuria, unspecified: Secondary | ICD-10-CM | POA: Diagnosis not present

## 2015-02-17 DIAGNOSIS — N182 Chronic kidney disease, stage 2 (mild): Secondary | ICD-10-CM | POA: Diagnosis not present

## 2015-02-17 DIAGNOSIS — E878 Other disorders of electrolyte and fluid balance, not elsewhere classified: Secondary | ICD-10-CM | POA: Diagnosis not present

## 2015-02-24 DIAGNOSIS — N183 Chronic kidney disease, stage 3 (moderate): Secondary | ICD-10-CM | POA: Diagnosis not present

## 2015-02-24 DIAGNOSIS — I151 Hypertension secondary to other renal disorders: Secondary | ICD-10-CM | POA: Diagnosis not present

## 2015-02-24 DIAGNOSIS — F411 Generalized anxiety disorder: Secondary | ICD-10-CM | POA: Diagnosis not present

## 2015-02-24 DIAGNOSIS — E785 Hyperlipidemia, unspecified: Secondary | ICD-10-CM | POA: Diagnosis not present

## 2015-02-24 DIAGNOSIS — I10 Essential (primary) hypertension: Secondary | ICD-10-CM | POA: Diagnosis not present

## 2015-02-24 DIAGNOSIS — E118 Type 2 diabetes mellitus with unspecified complications: Secondary | ICD-10-CM | POA: Diagnosis not present

## 2015-03-03 DIAGNOSIS — N281 Cyst of kidney, acquired: Secondary | ICD-10-CM | POA: Diagnosis not present

## 2015-03-03 DIAGNOSIS — N183 Chronic kidney disease, stage 3 (moderate): Secondary | ICD-10-CM | POA: Diagnosis not present

## 2015-03-06 DIAGNOSIS — N189 Chronic kidney disease, unspecified: Secondary | ICD-10-CM | POA: Diagnosis not present

## 2015-03-06 DIAGNOSIS — F419 Anxiety disorder, unspecified: Secondary | ICD-10-CM | POA: Diagnosis not present

## 2015-03-06 DIAGNOSIS — I471 Supraventricular tachycardia: Secondary | ICD-10-CM | POA: Diagnosis not present

## 2015-03-06 DIAGNOSIS — I10 Essential (primary) hypertension: Secondary | ICD-10-CM | POA: Diagnosis not present

## 2015-03-06 DIAGNOSIS — E1129 Type 2 diabetes mellitus with other diabetic kidney complication: Secondary | ICD-10-CM | POA: Diagnosis not present

## 2015-03-06 DIAGNOSIS — E785 Hyperlipidemia, unspecified: Secondary | ICD-10-CM | POA: Diagnosis not present

## 2015-03-06 DIAGNOSIS — G4700 Insomnia, unspecified: Secondary | ICD-10-CM | POA: Diagnosis not present

## 2015-03-06 DIAGNOSIS — Z853 Personal history of malignant neoplasm of breast: Secondary | ICD-10-CM | POA: Diagnosis not present

## 2015-04-14 DIAGNOSIS — N183 Chronic kidney disease, stage 3 (moderate): Secondary | ICD-10-CM | POA: Diagnosis not present

## 2015-04-14 DIAGNOSIS — N281 Cyst of kidney, acquired: Secondary | ICD-10-CM | POA: Diagnosis not present

## 2015-04-14 DIAGNOSIS — I151 Hypertension secondary to other renal disorders: Secondary | ICD-10-CM | POA: Diagnosis not present

## 2015-04-14 DIAGNOSIS — F411 Generalized anxiety disorder: Secondary | ICD-10-CM | POA: Diagnosis not present

## 2015-04-14 DIAGNOSIS — E118 Type 2 diabetes mellitus with unspecified complications: Secondary | ICD-10-CM | POA: Diagnosis not present

## 2015-04-14 DIAGNOSIS — I10 Essential (primary) hypertension: Secondary | ICD-10-CM | POA: Diagnosis not present

## 2015-04-24 DIAGNOSIS — H3531 Nonexudative age-related macular degeneration: Secondary | ICD-10-CM | POA: Diagnosis not present

## 2015-04-24 DIAGNOSIS — H2511 Age-related nuclear cataract, right eye: Secondary | ICD-10-CM | POA: Diagnosis not present

## 2015-04-24 DIAGNOSIS — Z961 Presence of intraocular lens: Secondary | ICD-10-CM | POA: Diagnosis not present

## 2015-04-24 DIAGNOSIS — E119 Type 2 diabetes mellitus without complications: Secondary | ICD-10-CM | POA: Diagnosis not present

## 2015-06-03 DIAGNOSIS — Z23 Encounter for immunization: Secondary | ICD-10-CM | POA: Diagnosis not present

## 2015-06-05 DIAGNOSIS — Z961 Presence of intraocular lens: Secondary | ICD-10-CM | POA: Diagnosis not present

## 2015-06-05 DIAGNOSIS — E119 Type 2 diabetes mellitus without complications: Secondary | ICD-10-CM | POA: Diagnosis not present

## 2015-06-05 DIAGNOSIS — H2511 Age-related nuclear cataract, right eye: Secondary | ICD-10-CM | POA: Diagnosis not present

## 2015-06-05 DIAGNOSIS — H353111 Nonexudative age-related macular degeneration, right eye, early dry stage: Secondary | ICD-10-CM | POA: Diagnosis not present

## 2015-07-22 DIAGNOSIS — H2511 Age-related nuclear cataract, right eye: Secondary | ICD-10-CM | POA: Diagnosis not present

## 2015-07-27 DIAGNOSIS — N184 Chronic kidney disease, stage 4 (severe): Secondary | ICD-10-CM

## 2015-07-27 HISTORY — PX: CATARACT EXTRACTION: SUR2

## 2015-07-27 HISTORY — DX: Chronic kidney disease, stage 4 (severe): N18.4

## 2015-07-29 DIAGNOSIS — Z01818 Encounter for other preprocedural examination: Secondary | ICD-10-CM | POA: Diagnosis not present

## 2015-07-29 DIAGNOSIS — H25019 Cortical age-related cataract, unspecified eye: Secondary | ICD-10-CM | POA: Diagnosis not present

## 2015-07-29 DIAGNOSIS — H269 Unspecified cataract: Secondary | ICD-10-CM | POA: Diagnosis not present

## 2015-07-29 DIAGNOSIS — I10 Essential (primary) hypertension: Secondary | ICD-10-CM | POA: Diagnosis not present

## 2015-07-29 DIAGNOSIS — E1129 Type 2 diabetes mellitus with other diabetic kidney complication: Secondary | ICD-10-CM | POA: Diagnosis not present

## 2015-08-20 DIAGNOSIS — N289 Disorder of kidney and ureter, unspecified: Secondary | ICD-10-CM | POA: Diagnosis not present

## 2015-08-20 DIAGNOSIS — H2511 Age-related nuclear cataract, right eye: Secondary | ICD-10-CM | POA: Diagnosis not present

## 2015-08-20 DIAGNOSIS — E785 Hyperlipidemia, unspecified: Secondary | ICD-10-CM | POA: Diagnosis not present

## 2015-08-20 DIAGNOSIS — Z79899 Other long term (current) drug therapy: Secondary | ICD-10-CM | POA: Diagnosis not present

## 2015-08-20 DIAGNOSIS — H269 Unspecified cataract: Secondary | ICD-10-CM | POA: Diagnosis not present

## 2015-08-20 DIAGNOSIS — Z87442 Personal history of urinary calculi: Secondary | ICD-10-CM | POA: Diagnosis not present

## 2015-08-20 DIAGNOSIS — Z7984 Long term (current) use of oral hypoglycemic drugs: Secondary | ICD-10-CM | POA: Diagnosis not present

## 2015-08-20 DIAGNOSIS — Z90710 Acquired absence of both cervix and uterus: Secondary | ICD-10-CM | POA: Diagnosis not present

## 2015-08-20 DIAGNOSIS — E119 Type 2 diabetes mellitus without complications: Secondary | ICD-10-CM | POA: Diagnosis not present

## 2015-08-20 DIAGNOSIS — E7800 Pure hypercholesterolemia, unspecified: Secondary | ICD-10-CM | POA: Diagnosis not present

## 2015-08-20 DIAGNOSIS — F329 Major depressive disorder, single episode, unspecified: Secondary | ICD-10-CM | POA: Diagnosis not present

## 2015-08-20 DIAGNOSIS — Z7982 Long term (current) use of aspirin: Secondary | ICD-10-CM | POA: Diagnosis not present

## 2015-08-21 DIAGNOSIS — E119 Type 2 diabetes mellitus without complications: Secondary | ICD-10-CM | POA: Diagnosis not present

## 2015-08-21 DIAGNOSIS — Z961 Presence of intraocular lens: Secondary | ICD-10-CM | POA: Diagnosis not present

## 2015-08-27 DIAGNOSIS — E119 Type 2 diabetes mellitus without complications: Secondary | ICD-10-CM | POA: Diagnosis not present

## 2015-08-27 DIAGNOSIS — Z961 Presence of intraocular lens: Secondary | ICD-10-CM | POA: Diagnosis not present

## 2015-09-15 DIAGNOSIS — Z961 Presence of intraocular lens: Secondary | ICD-10-CM | POA: Diagnosis not present

## 2016-01-16 DIAGNOSIS — C189 Malignant neoplasm of colon, unspecified: Secondary | ICD-10-CM | POA: Insufficient documentation

## 2016-01-16 DIAGNOSIS — C50919 Malignant neoplasm of unspecified site of unspecified female breast: Secondary | ICD-10-CM | POA: Diagnosis not present

## 2016-01-16 DIAGNOSIS — Z6828 Body mass index (BMI) 28.0-28.9, adult: Secondary | ICD-10-CM | POA: Diagnosis not present

## 2016-01-16 DIAGNOSIS — Z0000 Encounter for general adult medical examination without abnormal findings: Secondary | ICD-10-CM | POA: Insufficient documentation

## 2016-01-16 DIAGNOSIS — I10 Essential (primary) hypertension: Secondary | ICD-10-CM | POA: Diagnosis not present

## 2016-01-16 DIAGNOSIS — E784 Other hyperlipidemia: Secondary | ICD-10-CM | POA: Diagnosis not present

## 2016-01-16 DIAGNOSIS — N200 Calculus of kidney: Secondary | ICD-10-CM | POA: Diagnosis not present

## 2016-01-16 DIAGNOSIS — Z1231 Encounter for screening mammogram for malignant neoplasm of breast: Secondary | ICD-10-CM | POA: Diagnosis not present

## 2016-01-16 DIAGNOSIS — E119 Type 2 diabetes mellitus without complications: Secondary | ICD-10-CM | POA: Diagnosis not present

## 2016-01-16 DIAGNOSIS — Z1389 Encounter for screening for other disorder: Secondary | ICD-10-CM | POA: Diagnosis not present

## 2016-01-16 DIAGNOSIS — H3530 Unspecified macular degeneration: Secondary | ICD-10-CM | POA: Insufficient documentation

## 2016-01-19 ENCOUNTER — Other Ambulatory Visit: Payer: Self-pay | Admitting: Internal Medicine

## 2016-01-19 DIAGNOSIS — Z1231 Encounter for screening mammogram for malignant neoplasm of breast: Secondary | ICD-10-CM

## 2016-02-20 DIAGNOSIS — E119 Type 2 diabetes mellitus without complications: Secondary | ICD-10-CM | POA: Diagnosis not present

## 2016-02-20 DIAGNOSIS — E784 Other hyperlipidemia: Secondary | ICD-10-CM | POA: Diagnosis not present

## 2016-02-20 DIAGNOSIS — I10 Essential (primary) hypertension: Secondary | ICD-10-CM | POA: Diagnosis not present

## 2016-03-08 DIAGNOSIS — Z961 Presence of intraocular lens: Secondary | ICD-10-CM | POA: Diagnosis not present

## 2016-03-08 DIAGNOSIS — Z0100 Encounter for examination of eyes and vision without abnormal findings: Secondary | ICD-10-CM | POA: Diagnosis not present

## 2016-03-08 DIAGNOSIS — H353112 Nonexudative age-related macular degeneration, right eye, intermediate dry stage: Secondary | ICD-10-CM | POA: Diagnosis not present

## 2016-03-08 DIAGNOSIS — E119 Type 2 diabetes mellitus without complications: Secondary | ICD-10-CM | POA: Diagnosis not present

## 2016-03-17 DIAGNOSIS — R059 Cough, unspecified: Secondary | ICD-10-CM | POA: Insufficient documentation

## 2016-03-17 DIAGNOSIS — R05 Cough: Secondary | ICD-10-CM | POA: Diagnosis not present

## 2016-04-08 DIAGNOSIS — Z780 Asymptomatic menopausal state: Secondary | ICD-10-CM | POA: Diagnosis not present

## 2016-04-12 DIAGNOSIS — N200 Calculus of kidney: Secondary | ICD-10-CM | POA: Diagnosis not present

## 2016-04-12 DIAGNOSIS — E784 Other hyperlipidemia: Secondary | ICD-10-CM | POA: Diagnosis not present

## 2016-05-06 DIAGNOSIS — Z23 Encounter for immunization: Secondary | ICD-10-CM | POA: Diagnosis not present

## 2016-05-21 DIAGNOSIS — F329 Major depressive disorder, single episode, unspecified: Secondary | ICD-10-CM | POA: Diagnosis not present

## 2016-05-21 DIAGNOSIS — E119 Type 2 diabetes mellitus without complications: Secondary | ICD-10-CM | POA: Diagnosis not present

## 2016-05-21 DIAGNOSIS — I10 Essential (primary) hypertension: Secondary | ICD-10-CM | POA: Diagnosis not present

## 2016-05-21 DIAGNOSIS — Z6830 Body mass index (BMI) 30.0-30.9, adult: Secondary | ICD-10-CM | POA: Diagnosis not present

## 2016-06-04 DIAGNOSIS — I10 Essential (primary) hypertension: Secondary | ICD-10-CM | POA: Diagnosis not present

## 2016-06-04 DIAGNOSIS — Z6829 Body mass index (BMI) 29.0-29.9, adult: Secondary | ICD-10-CM | POA: Diagnosis not present

## 2016-06-11 DIAGNOSIS — F329 Major depressive disorder, single episode, unspecified: Secondary | ICD-10-CM | POA: Diagnosis not present

## 2016-06-24 DIAGNOSIS — F329 Major depressive disorder, single episode, unspecified: Secondary | ICD-10-CM | POA: Diagnosis not present

## 2016-07-07 DIAGNOSIS — F329 Major depressive disorder, single episode, unspecified: Secondary | ICD-10-CM | POA: Diagnosis not present

## 2016-08-25 DIAGNOSIS — E784 Other hyperlipidemia: Secondary | ICD-10-CM | POA: Diagnosis not present

## 2016-08-25 DIAGNOSIS — E119 Type 2 diabetes mellitus without complications: Secondary | ICD-10-CM | POA: Diagnosis not present

## 2016-08-25 DIAGNOSIS — I10 Essential (primary) hypertension: Secondary | ICD-10-CM | POA: Diagnosis not present

## 2016-08-25 DIAGNOSIS — Z6830 Body mass index (BMI) 30.0-30.9, adult: Secondary | ICD-10-CM | POA: Diagnosis not present

## 2016-08-25 DIAGNOSIS — F329 Major depressive disorder, single episode, unspecified: Secondary | ICD-10-CM | POA: Diagnosis not present

## 2017-03-15 DIAGNOSIS — R809 Proteinuria, unspecified: Secondary | ICD-10-CM | POA: Insufficient documentation

## 2017-03-15 DIAGNOSIS — R252 Cramp and spasm: Secondary | ICD-10-CM | POA: Insufficient documentation

## 2017-03-25 ENCOUNTER — Encounter: Payer: Self-pay | Admitting: *Deleted

## 2017-04-01 ENCOUNTER — Other Ambulatory Visit: Payer: Self-pay | Admitting: Internal Medicine

## 2017-04-01 DIAGNOSIS — Z1231 Encounter for screening mammogram for malignant neoplasm of breast: Secondary | ICD-10-CM

## 2017-04-15 ENCOUNTER — Ambulatory Visit: Payer: Medicare Other | Admitting: Internal Medicine

## 2017-04-18 ENCOUNTER — Ambulatory Visit: Payer: Self-pay

## 2017-04-28 ENCOUNTER — Ambulatory Visit
Admission: RE | Admit: 2017-04-28 | Discharge: 2017-04-28 | Disposition: A | Discharge status: Home / Self Care | Payer: Medicare Other | Source: Ambulatory Visit | Attending: Internal Medicine | Admitting: Internal Medicine

## 2017-04-28 DIAGNOSIS — Z1231 Encounter for screening mammogram for malignant neoplasm of breast: Secondary | ICD-10-CM

## 2017-04-28 HISTORY — DX: Personal history of irradiation: Z92.3

## 2017-05-13 ENCOUNTER — Encounter: Payer: Self-pay | Admitting: Internal Medicine

## 2017-05-23 ENCOUNTER — Ambulatory Visit: Payer: Medicare Other | Admitting: Internal Medicine

## 2017-05-30 ENCOUNTER — Ambulatory Visit (INDEPENDENT_AMBULATORY_CARE_PROVIDER_SITE_OTHER): Payer: Medicare Other | Admitting: Internal Medicine

## 2017-05-30 ENCOUNTER — Encounter: Payer: Self-pay | Admitting: Internal Medicine

## 2017-05-30 VITALS — BP 128/92 | HR 104 | Ht 67.5 in | Wt 189.8 lb

## 2017-05-30 DIAGNOSIS — E782 Mixed hyperlipidemia: Secondary | ICD-10-CM | POA: Diagnosis not present

## 2017-05-30 DIAGNOSIS — R0609 Other forms of dyspnea: Secondary | ICD-10-CM

## 2017-05-30 DIAGNOSIS — I10 Essential (primary) hypertension: Secondary | ICD-10-CM

## 2017-05-30 DIAGNOSIS — E1159 Type 2 diabetes mellitus with other circulatory complications: Secondary | ICD-10-CM | POA: Insufficient documentation

## 2017-05-30 DIAGNOSIS — R000 Tachycardia, unspecified: Secondary | ICD-10-CM | POA: Insufficient documentation

## 2017-05-30 DIAGNOSIS — R0600 Dyspnea, unspecified: Secondary | ICD-10-CM

## 2017-05-30 DIAGNOSIS — I152 Hypertension secondary to endocrine disorders: Secondary | ICD-10-CM | POA: Insufficient documentation

## 2017-05-30 NOTE — Patient Instructions (Signed)
Medication Instructions:  Your physician recommends that you continue on your current medications as directed. Please refer to the Current Medication list given to you today.   Labwork: None   Testing/Procedures: Your physician has requested that you have an echocardiogram. Echocardiography is a painless test that uses sound waves to create images of your heart. It provides your doctor with information about the size and shape of your heart and how well your heart's chambers and valves are working. This procedure takes approximately one hour. There are no restrictions for this procedure.   Follow-Up: Your physician recommends that you schedule a follow-up appointment in: 3 months with Dr End.      Mediterranean Diet A Mediterranean diet refers to food and lifestyle choices that are based on the traditions of countries located on the [employer]. This way of eating has been shown to help prevent certain conditions and improve outcomes for people who have chronic diseases, like kidney disease and heart disease. What are tips for following this plan? Lifestyle  Cook and eat meals together with your family, when possible.  Drink enough fluid to keep your urine clear or pale yellow.  Be physically active every day. This includes: ? Aerobic exercise like running or swimming. ? Leisure activities like gardening, walking, or housework.  Get 7-8 hours of sleep each night.  If recommended by your health care provider, drink red wine in moderation. This means 1 glass a day for nonpregnant women and 2 glasses a day for men. A glass of wine equals 5 oz (150 mL). Reading food labels  Check the serving size of packaged foods. For foods such as rice and pasta, the serving size refers to the amount of cooked product, not dry.  Check the total fat in packaged foods. Avoid foods that have saturated fat or trans fats.  Check the ingredients list for added sugars, such as corn  syrup. Shopping  At the grocery store, buy most of your food from the areas near the walls of the store. This includes: ? Fresh fruits and vegetables (produce). ? Grains, beans, nuts, and seeds. Some of these may be available in unpackaged forms or large amounts (in bulk). ? Fresh seafood. ? Poultry and eggs. ? Low-fat dairy products.  Buy whole ingredients instead of prepackaged foods.  Buy fresh fruits and vegetables in-season from local farmers markets.  Buy frozen fruits and vegetables in resealable bags.  If you do not have access to quality fresh seafood, buy precooked frozen shrimp or canned fish, such as tuna, salmon, or sardines.  Buy small amounts of raw or cooked vegetables, salads, or olives from the deli or salad bar at your store.  Stock your pantry so you always have certain foods on hand, such as olive oil, canned tuna, canned tomatoes, rice, pasta, and beans. Cooking  Cook foods with extra-virgin olive oil instead of using butter or other vegetable oils.  Have meat as a side dish, and have vegetables or grains as your main dish. This means having meat in small portions or adding small amounts of meat to foods like pasta or stew.  Use beans or vegetables instead of meat in common dishes like chili or lasagna.  Experiment with different cooking methods. Try roasting or broiling vegetables instead of steaming or sauteing them.  Add frozen vegetables to soups, stews, pasta, or rice.  Add nuts or seeds for added healthy fat at each meal. You can add these to yogurt, salads, or vegetable dishes.  Marinate fish or vegetables using olive oil, lemon juice, garlic, and fresh herbs. Meal planning  Plan to eat 1 vegetarian meal one day each week. Try to work up to 2 vegetarian meals, if possible.  Eat seafood 2 or more times a week.  Have healthy snacks readily available, such as: ? Vegetable sticks with hummus. ? [place] yogurt. ? Fruit and nut trail mix.  Eat  balanced meals throughout the week. This includes: ? Fruit: 2-3 servings a day ? Vegetables: 4-5 servings a day ? Low-fat dairy: 2 servings a day ? Fish, poultry, or lean meat: 1 serving a day ? Beans and legumes: 2 or more servings a week ? Nuts and seeds: 1-2 servings a day ? Whole grains: 6-8 servings a day ? Extra-virgin olive oil: 3-4 servings a day  Limit red meat and sweets to only a few servings a month What are my food choices?  Mediterranean diet ? Recommended ? Grains: Whole-grain pasta. Brown rice. Bulgar wheat. Polenta. Couscous. Whole-wheat bread. [place]. ? Vegetables: Artichokes. Beets. Broccoli. Cabbage. Carrots. Eggplant. Green beans. Chard. Kale. Spinach. Onions. Leeks. Peas. Squash. Tomatoes. Peppers. Radishes. ? Fruits: Apples. Apricots. Avocado. Berries. Bananas. Cherries. Dates. Figs. Grapes. Lemons. Melon. Oranges. Peaches. Plums. Pomegranate. ? Meats and other protein foods: Beans. Almonds. Sunflower seeds. Pine nuts. Peanuts. [place]. Salmon. Scallops. Shrimp. [place]. Tilapia. Clams. Oysters. Eggs. ? Dairy: Low-fat milk. Cheese. Greek yogurt. ? Beverages: Water. Red wine. Herbal tea. ? Fats and oils: Extra virgin olive oil. Avocado oil. Grape seed oil. ? Sweets and desserts: [place] yogurt with honey. Baked apples. Poached pears. Trail mix. ? Seasoning and other foods: Basil. Cilantro. Coriander. Cumin. Mint. Parsley. Sage. Rosemary. Tarragon. Garlic. Oregano. Thyme. Pepper. Balsalmic vinegar. Tahini. Hummus. Tomato sauce. Olives. Mushrooms. ? Limit these ? Grains: Prepackaged pasta or rice dishes. Prepackaged cereal with added sugar. ? Vegetables: Deep fried potatoes (french fries). ? Fruits: Fruit canned in syrup. ? Meats and other protein foods: Beef. Pork. Lamb. Poultry with skin. Hot dogs. Bernie[place] Salines. ? Dairy: Ice cream. Sour cream. Whole milk. ? Beverages: Juice. Sugar-sweetened soft drinks. Beer. Liquor and spirits. ? Fats and oils: Butter. Canola oil.  Vegetable oil. Beef fat (tallow). Lard. ? Sweets and desserts: Cookies. Cakes. Pies. Candy. ? Seasoning and other foods: Mayonnaise. Premade sauces and marinades. ? The items listed may not be a complete list. Talk with your dietitian about what dietary choices are right for you. Summary  The Mediterranean diet includes both food and lifestyle choices.  Eat a variety of fresh fruits and vegetables, beans, nuts, seeds, and whole grains.  Limit the amount of red meat and sweets that you eat.  Talk with your health care provider about whether it is safe for you to drink red wine in moderation. This means 1 glass a day for nonpregnant women and 2 glasses a day for men. A glass of wine equals 5 oz (150 mL). This information is not intended to replace advice given to you by your health care provider. Make sure you discuss any questions you have with your health care provider. Document Released: 03/04/2016 Document Revised: 04/06/2016 Document Reviewed: 03/04/2016 Elsevier Interactive Patient Education  2018 [employer].    Any Other Special Instructions Will Be Listed Below (If Applicable).     If you need a refill on your cardiac medications before your next appointment, please call your pharmacy.

## 2017-05-30 NOTE — Progress Notes (Signed)
New Outpatient Visit Date: 05/30/2017  Referring Provider: Crist Infante, MD [address] [place],  [address]  Chief Complaint: Shortness of breath  HPI:  Ms. Mcnew is a 76 y.o. female who is being seen today for the evaluation of dyspnea on exertion at the request of Dr. Joylene Draft. She has a history of tachycardia status post ablation in 2007, hypertension, hyperlipidemia, type 2 diabetes mellitus, chronic kidney disease stage III,breast cancer status post right lumpectomy and radiation, and colon cancer. Mr. Treloar reports months to years of shortness of breath, predominantly when climbing stairs. She has attributed this to holding her breath while going up stairs and then needing to breathe more heavily when reaching the top. Recently, she has tried to breathe slowly while climbing the stairs and has noticed that she feels less dyspneic when she reaches top. She also notices shortness of breath when bending over and attributes this to progressive increase in her abdominal girth.  Mrs. Arkin denies chest pain, lightheadedness, orthopnea, PND, and edema. At times, she feels as though her heart rate is a little bit high when she awakens from a vivid dream. Otherwise, she denies palpitations. She tries to exercise, typically walking 1 mile in 20-25 minutes, 4-5 days a week. She has noted a nonproductive cough over the last 3 days, which she attributes to a cold.   Ms. Ishee previously followed with Dr. Nicanor Bake in [place] and reports having last seen him shortly before she moved to [facility] about 1.5 years go.  --------------------------------------------------------------------------------------------------  Cardiovascular History & Procedures: Cardiovascular Problems:  Dyspnea on exertion  Tachycardia status post ablation (2007)  Risk Factors:  Hypertension, hyperlipidemia, diabetes mellitus, obesity, and age greater than 36  Cath/PCI:  None  CV  Surgery:  None  EP Procedures and Devices:  Ablation (2017, [place]): Tachycardia ablation; patient does not recall further details.  Non-Invasive Evaluation(s):  None available  --------------------------------------------------------------------------------------------------  Past Medical History:  Diagnosis Date  . Anxiety   . Back ache   . Breast cancer ([place])    right  . Bronchitis   . Cancer of colon ([place])   . Depression   . DM (diabetes mellitus) ([place])   . Dyspnea on exertion   . Fall on steps    age 13 feel down iron stairs couldnot move leg for 6 mths  . Family history of primary TB   . Fracture of ankle, closed 2014   left  . History of chicken pox   . History of primary TB   . Hyperlipidemia   . Hypertension   . Kidney disease, chronic, stage III (moderate, EGFR 30-59 ml/min) ([place]) 2017  . Kidney disorder    reduction function  . Kidney stones   . Knee pain, right   . Lung abnormality    age 28 patient had pna spot on lungs   . Microalbuminuria   . Muscle cramps    both legs  . Muscular degeneration    of right eye  . Nephrolithiasis   . Palpitations   . Personal history of radiation therapy     Past Surgical History:  Procedure Laterality Date  . ABLATION  2007   fast heart rate  . ANKLE FRACTURE SURGERY Left 2014  . BREAST LUMPECTOMY Right   . CATARACT EXTRACTION Right 2017  . colonscopy  2015  . HYSTEROTOMY     45 years ago  . TONSILLECTOMY AND ADENOIDECTOMY     age 45    Current Meds  Medication  Sig  . amLODipine (NORVASC) 5 MG tablet Take 5 mg by mouth daily.  . ARIPiprazole (ABILIFY) 5 MG tablet Take 5 mg daily by mouth.  Marland Kitchen aspirin EC 81 MG tablet Take 81 mg by mouth daily.  Vanessa Kick Ethyl (VASCEPA) 1 g CAPS Take 1 g by mouth 2 (two) times daily.  Marland Kitchen linagliptin (TRADJENTA) 5 MG TABS tablet Take 5 mg by mouth daily.  Marland Kitchen lisinopril (PRINIVIL,ZESTRIL) 10 MG tablet Take 10 mg by mouth daily.  Marland Kitchen LORazepam (ATIVAN) 0.5 MG tablet  Take 0.5 mg by mouth 4 (four) times daily.  . metFORMIN (GLUCOPHAGE) 500 MG tablet Take 500 mg by mouth 2 (two) times daily with a meal.  . mirtazapine (REMERON) 15 MG tablet Take 15 mg by mouth at bedtime.  . multivitamin-lutein (OCUVITE-LUTEIN) CAPS capsule Take 2 capsules by mouth daily.  Marland Kitchen omega-3 acid ethyl esters (LOVAZA) 1 g capsule Take 1 g by mouth 2 (two) times daily.  . pravastatin (PRAVACHOL) 20 MG tablet Take 20 mg by mouth daily.  Marland Kitchen venlafaxine XR (EFFEXOR-XR) 150 MG 24 hr capsule Take 150 mg by mouth every other day.  . venlafaxine XR (EFFEXOR-XR) 75 MG 24 hr capsule Take 75 mg by mouth every other day.    Allergies: Patient has no known allergies.  Social History   Socioeconomic History  . Marital status: Divorced    Spouse name: Not on file  . Number of children: 2  . Years of education: Not on file  . Highest education level: Not on file  Social Needs  . Financial resource strain: Not on file  . Food insecurity - worry: Not on file  . Food insecurity - inability: Not on file  . Transportation needs - medical: Not on file  . Transportation needs - non-medical: Not on file  Occupational History  . Not on file  Tobacco Use  . Smoking status: Never Smoker  . Smokeless tobacco: Never Used  Substance and Sexual Activity  . Alcohol use: No  . Drug use: No  . Sexual activity: Not on file  Other Topics Concern  . Not on file  Social History Narrative  . Not on file    Family History  Problem Relation Age of Onset  . Peripheral Artery Disease Mother 1       HX OF POOR CIRCULATION, SMOKING, LEG AMPUTATION  . Cancer Father   . Asthma Brother 44  . Kidney Stones Brother   . Breast cancer Neg Hx     Review of Systems: A 12-system review of systems was performed and was negative except as noted in the HPI.  --------------------------------------------------------------------------------------------------  Physical Exam: BP (!) 128/92   Pulse (!) 104   Ht  5' 7.5" (1.715 m)   Wt 189 lb 12.8 oz (86.1 kg)   BMI 29.29 kg/m   General:  Overweight woman, seated comfortably in the exam room. HEENT: No conjunctival pallor or scleral icterus. Moist mucous membranes. OP clear. Neck: Supple without lymphadenopathy, thyromegaly, JVD, or HJR. No carotid bruit. Lungs: Normal work of breathing. Clear to auscultation bilaterally without wheezes or crackles. Heart: Tachycardic but regular without murmurs, rubs, or gallops. Non-displaced PMI. Abd: Bowel sounds present. Soft, NT/ND without hepatosplenomegaly Ext: No lower extremity edema. Radial, PT, and DP pulses are 2+ bilaterally Skin: Warm and dry without rash. Neuro: CNIII-XII intact. Strength and fine-touch sensation intact in upper and lower extremities bilaterally. Psych: Normal mood and affect.  EKG:  Sinus tachycardia (heart rate 104 bpm)  with inferior Q waves.  No results found for: WBC, HGB, HCT, MCV, PLT  No results found for: NA, K, CL, CO2, BUN, CREATININE, GLUCOSE, ALT  No results found for: CHOL, HDL, LDLCALC, LDLDIRECT, TRIG, CHOLHDL  Outside labs (03/15/17): CMP: Sodium 137, potassium 5.1, chloride 105, CO2 22, he went 32, creatinine 1.6, glucose 251, calcium 9.8, AST 46, ALT 55  CBC: WBC 6.0, HGB 12.8, HCT 39.5, platelet 205  Lipid panel: Total cholesterol 241, triglycerides 730, HDL 33, LDL 62  TSH 1.99  --------------------------------------------------------------------------------------------------  ASSESSMENT AND PLAN: Dyspnea on exertion and sinus tachycardia This could be due to a number factors. With changes in her breathing, Ms. Alper notes that her exertional dyspnea has actually improved. She appears euvolemic on exam today. However, she is tachycardic with EKG also demonstrating inferior Q waves. We have agreed to obtain a transthoracic echocardiogram. If this does not show a regional wall motion abnormality or significant reduced LVEF, we will then proceed with  pharmacologic myocardial perfusion stress test. If the echo suggests underlying cardiomyopathy, we would instead consider proceeding directly to catheterization. In the meantime, we will not make any medication changes. We will request records from her cardiologist (Dr. Nicanor Bake in [place], [place]) regarding prior cardiovascular testing and ablation.  Hyperlipidemia Lipid panel obtained this summer by Dr. Joylene Draft is notable for significant hypertriglyceridemia and slightly low HDL. LDL is well controlled. I have advised Ms. Raffo to continue with omega-3 treatment and dietary modifications. I have provided her with information about the Mediterranean diet. It may be worthwhile for Dr. Joylene Draft to repeat a lipid panel in a few months. If her triglycerides remain significantly elevated (>500), addition of a fibrate will need to be considered.  Hypertension Diastolic pressure is slightly elevated today. I will defer making any medication changes at this time  Follow-up: Return to clinic in 3 months.  Nelva Bush, MD 05/30/2017 1:18 PM

## 2017-05-31 DIAGNOSIS — Z794 Long term (current) use of insulin: Secondary | ICD-10-CM | POA: Insufficient documentation

## 2017-06-06 NOTE — Progress Notes (Signed)
Mailed release to pt to return to Medical Records when she comes for echo 06/14/17 Dr Nettie Elm [phone]

## 2017-06-07 ENCOUNTER — Other Ambulatory Visit (HOSPITAL_COMMUNITY): Payer: Medicare Other

## 2017-06-07 NOTE — Addendum Note (Signed)
Addended by: Sandrea Hammond D on: 06/07/2017 08:48 AM   Modules accepted: Orders

## 2017-06-13 DIAGNOSIS — H6590 Unspecified nonsuppurative otitis media, unspecified ear: Secondary | ICD-10-CM | POA: Insufficient documentation

## 2017-06-13 DIAGNOSIS — J069 Acute upper respiratory infection, unspecified: Secondary | ICD-10-CM | POA: Insufficient documentation

## 2017-06-13 DIAGNOSIS — H6120 Impacted cerumen, unspecified ear: Secondary | ICD-10-CM | POA: Insufficient documentation

## 2017-06-14 ENCOUNTER — Ambulatory Visit (HOSPITAL_COMMUNITY): Payer: Medicare Other | Attending: Cardiology

## 2017-06-14 ENCOUNTER — Other Ambulatory Visit: Payer: Self-pay

## 2017-06-14 DIAGNOSIS — Z85038 Personal history of other malignant neoplasm of large intestine: Secondary | ICD-10-CM | POA: Insufficient documentation

## 2017-06-14 DIAGNOSIS — R000 Tachycardia, unspecified: Secondary | ICD-10-CM | POA: Insufficient documentation

## 2017-06-14 DIAGNOSIS — I358 Other nonrheumatic aortic valve disorders: Secondary | ICD-10-CM | POA: Insufficient documentation

## 2017-06-14 DIAGNOSIS — Z923 Personal history of irradiation: Secondary | ICD-10-CM | POA: Diagnosis not present

## 2017-06-14 DIAGNOSIS — E1122 Type 2 diabetes mellitus with diabetic chronic kidney disease: Secondary | ICD-10-CM | POA: Insufficient documentation

## 2017-06-14 DIAGNOSIS — Z853 Personal history of malignant neoplasm of breast: Secondary | ICD-10-CM | POA: Insufficient documentation

## 2017-06-14 DIAGNOSIS — E785 Hyperlipidemia, unspecified: Secondary | ICD-10-CM | POA: Diagnosis not present

## 2017-06-14 DIAGNOSIS — R0609 Other forms of dyspnea: Secondary | ICD-10-CM

## 2017-06-14 DIAGNOSIS — I129 Hypertensive chronic kidney disease with stage 1 through stage 4 chronic kidney disease, or unspecified chronic kidney disease: Secondary | ICD-10-CM | POA: Diagnosis not present

## 2017-06-14 DIAGNOSIS — R0600 Dyspnea, unspecified: Secondary | ICD-10-CM

## 2017-06-14 DIAGNOSIS — N189 Chronic kidney disease, unspecified: Secondary | ICD-10-CM | POA: Insufficient documentation

## 2017-06-15 ENCOUNTER — Telehealth: Payer: Self-pay | Admitting: *Deleted

## 2017-06-15 DIAGNOSIS — R0609 Other forms of dyspnea: Principal | ICD-10-CM

## 2017-06-15 DIAGNOSIS — R0600 Dyspnea, unspecified: Secondary | ICD-10-CM

## 2017-06-15 NOTE — Telephone Encounter (Signed)
Notes recorded by Katrine Coho, RN on 06/15/2017 at 9:07 AM EST Discussed results with patient, she verbalized understanding, agreed with plan. ------  Notes recorded by Nelva Bush, MD on 06/14/2017 at 8:44 PM EST Please let Ms. Dipiero know that her echo does not show any significant abnormalities. Mild thickening of the aortic valve and diastolic dysfunction are noted but do not account for her symptoms. I suggest that we proceed with pharmacologic myocardial perfusion stress test for evaluation of her shortness of breath.   Instructions reviewed with patient.  Your physician has requested that you have a lexiscan myoview. For further information please visit [email or web address].   How to Prepare for Your Myoview Test:        A. Nothing to eat or drink 2 hours prior to arrival time, except you may drink water       B. No Caffeine/Decaffeinated products or chocolate 12 hours prior to arrival.       C. No Cologne or Lotion       D. Wear comfortable walking shoes       E. Total time is 3 to 4 hours; you may want to bring reading material for the waiting               time. If someone comes with you, they will need to remain in the lobby due to                  limited space in the testing area.        F. Please report to [address] for your test.        G. Medication Instructions:   After You Arrive:  Once you arrive in the Nuclear Cardiology lab, an IV will be started, then the Technologist will inject a small amount of radioactive tracer. There will be a 1 hour waiting period after this injection. A series of pictures will be taken of your heart following this waiting period. You will be prepped for the stress portion of the test. During the stress portion of your test a small amount of radioactive tracer will be injected in your IV. After the stress portion, there is a short rest period during which time your heart and blood pressure will be monitored. After  the short test period the Technologist will begin your second set of pictures. Your doctor will inform you of your test results within 7 days.  In preparation for your appointment, medication, and supplies will be purchased. Appointment availability is limited, so if you need to cancel or reschedule please call the Nuclear Department at [phone], 24 hours in advance to avoid a cancellation fee of $50.00

## 2017-06-15 NOTE — Telephone Encounter (Signed)
Copied from Dr Darnelle Bos 05/30/17 office note:   We will request records from her cardiologist (Dr. Nicanor Bake in [place], [place]) regarding prior cardiovascular testing and ablation.  06/15/17--pt has signed a medical record release, it will be faxed to Dr Nicanor Bake.

## 2017-06-27 ENCOUNTER — Telehealth: Payer: Self-pay | Admitting: Internal Medicine

## 2017-06-27 NOTE — Telephone Encounter (Signed)
Release of information Faxed to Dr.Mary Marengo Office at [phone]

## 2017-07-20 ENCOUNTER — Telehealth (HOSPITAL_COMMUNITY): Payer: Self-pay | Admitting: *Deleted

## 2017-07-20 NOTE — Telephone Encounter (Signed)
Left message on voicemail in reference to upcoming appointment scheduled for 07/25/17. Phone number given for a call back so details instructions can be given. Kirstie Peri

## 2017-07-25 ENCOUNTER — Encounter (HOSPITAL_COMMUNITY): Payer: Medicare Other

## 2017-07-28 ENCOUNTER — Encounter: Payer: Self-pay | Admitting: Internal Medicine

## 2017-08-04 ENCOUNTER — Telehealth (HOSPITAL_COMMUNITY): Payer: Self-pay | Admitting: *Deleted

## 2017-08-04 NOTE — Telephone Encounter (Signed)
Patient given detailed instructions per Myocardial Perfusion Study Information Sheet for the test on 08/08/17 Patient notified to arrive 15 minutes early and that it is imperative to arrive on time for appointment to keep from having the test rescheduled.  If you need to cancel or reschedule your appointment, please call the office within 24 hours of your appointment. . Patient verbalized understanding. Tennis Mckinnon Jacqueline    

## 2017-08-08 ENCOUNTER — Encounter (HOSPITAL_COMMUNITY): Payer: Medicare Other

## 2017-08-11 ENCOUNTER — Telehealth: Payer: Self-pay | Admitting: *Deleted

## 2017-08-11 NOTE — Telephone Encounter (Signed)
-----   Message from [place] sent at 08/08/2017 11:57 AM EST ----- Evern Core,  I faxed a release over on this patient to Dr.[facility]. Release received back stating they have no records on this patient.   Thanks, Maudie Mercury

## 2017-08-30 ENCOUNTER — Telehealth (HOSPITAL_COMMUNITY): Payer: Self-pay | Admitting: *Deleted

## 2017-08-30 NOTE — Telephone Encounter (Signed)
Left message on voicemail in reference to upcoming appointment scheduled for 09/02/17. Phone number given for a call back so details instructions can be given. [name]

## 2017-09-01 ENCOUNTER — Encounter: Payer: Self-pay | Admitting: Internal Medicine

## 2017-09-01 ENCOUNTER — Ambulatory Visit: Payer: Medicare Other | Admitting: Internal Medicine

## 2017-09-01 ENCOUNTER — Encounter (INDEPENDENT_AMBULATORY_CARE_PROVIDER_SITE_OTHER): Payer: Self-pay

## 2017-09-01 VITALS — BP 118/80 | HR 103 | Ht 67.5 in | Wt 190.0 lb

## 2017-09-01 DIAGNOSIS — I10 Essential (primary) hypertension: Secondary | ICD-10-CM | POA: Diagnosis not present

## 2017-09-01 DIAGNOSIS — R0609 Other forms of dyspnea: Secondary | ICD-10-CM | POA: Diagnosis not present

## 2017-09-01 DIAGNOSIS — R000 Tachycardia, unspecified: Secondary | ICD-10-CM

## 2017-09-01 DIAGNOSIS — I495 Sick sinus syndrome: Secondary | ICD-10-CM | POA: Insufficient documentation

## 2017-09-01 DIAGNOSIS — E782 Mixed hyperlipidemia: Secondary | ICD-10-CM | POA: Diagnosis not present

## 2017-09-01 DIAGNOSIS — R0600 Dyspnea, unspecified: Secondary | ICD-10-CM

## 2017-09-01 NOTE — Progress Notes (Signed)
Follow-up Outpatient Visit Date: 09/01/2017  Primary Care Provider: Crist Infante, [place] [address]  Chief Complaint: Dyspnea on exertion  HPI:  [name] is a 77 y.o. year-old female with history of tachycardia status post ablation in 2007, hypertension, hyperlipidemia, type 2 diabetes mellitus, chronic kidney disease stage III,breast cancer status post right lumpectomy and radiation, and colon cancer, who presents for follow-up of shortness of breath.  I met [name] in 05/2017, at which time she reported months to years of exertional dyspnea, predominantly when climbing stairs.  Subsequent echocardiogram showed vigorous LV contraction with grade 1 diastolic dysfunction as well as mild aortic valve thickening without stenosis.  A pharmacologic myocardial perfusion stress test was recommended, but this has yet to be performed.  Today, [name] reports that she has been feeling relatively well.  Her breathing has improved since she began taking regular breaths when walking up stairs.  However, she notes that her son recently told her that she seemed out of breath when walking and suggested that she was out of shape.  [name] has not had any chest pain, palpitations, lightheadedness, orthopnea, PND, or edema.  She has questions about the rationale behind stress test scheduled for tomorrow, given that echo was largely unrevealing.  She also is concerned about walking on a treadmill, due to some leg discomfort as well as a prior fall on the treadmill.  [name] notes that some of her diabetes medications were recently changed, though she does not recall the names of the medications.  --------------------------------------------------------------------------------------------------  Cardiovascular History & Procedures: Cardiovascular Problems:  Dyspnea on exertion  Tachycardia status post ablation (2007)  Risk Factors:  Hypertension,  hyperlipidemia, diabetes mellitus, obesity, and age greater than 25  Cath/PCI:  None  CV Surgery:  None  EP Procedures and Devices:  Ablation (2007, [place]): Tachycardia ablation; patient does not recall further details.  Non-Invasive Evaluation(s):  TTE (06/14/17): Normal LV size.  LVEF 65-70% with normal wall motion.  Grade 1 diastolic dysfunction.  Trileaflet aortic valve with mild thickening.  No stenosis or regurgitation.  Trivial MR, TR, and PR.  Normal RV size and function.  Normal PA pressure.  Past medical and surgical history were reviewed and updated in EPIC.  Current Meds  Medication Sig  . amLODipine (NORVASC) 5 MG tablet Take 5 mg by mouth daily.  . ARIPiprazole (ABILIFY) 5 MG tablet Take 5 mg daily by mouth.  Marland Kitchen aspirin EC 81 MG tablet Take 81 mg by mouth daily.  Vanessa Kick Ethyl (VASCEPA) 1 g CAPS Take 1 g by mouth 2 (two) times daily.  Marland Kitchen linagliptin (TRADJENTA) 5 MG TABS tablet Take 5 mg by mouth daily.  Marland Kitchen lisinopril (PRINIVIL,ZESTRIL) 10 MG tablet Take 10 mg by mouth daily.  Marland Kitchen LORazepam (ATIVAN) 0.5 MG tablet Take 0.5 mg by mouth 4 (four) times daily.  . metFORMIN (GLUCOPHAGE) 500 MG tablet Take 500 mg by mouth 2 (two) times daily with a meal.  . mirtazapine (REMERON) 15 MG tablet Take 15 mg by mouth at bedtime.  . multivitamin-lutein (OCUVITE-LUTEIN) CAPS capsule Take 2 capsules by mouth daily.  Marland Kitchen omega-3 acid ethyl esters (LOVAZA) 1 g capsule Take 1 g by mouth 2 (two) times daily.  . pravastatin (PRAVACHOL) 20 MG tablet Take 20 mg by mouth daily.  Marland Kitchen venlafaxine XR (EFFEXOR-XR) 150 MG 24 hr capsule Take 150 mg by mouth every other day.  . venlafaxine XR (EFFEXOR-XR) 75 MG 24 hr capsule Take 75  mg by mouth every other day.    Allergies: Patient has no known allergies.  Social History   Socioeconomic History  . Marital status: Divorced    Spouse name: Not on file  . Number of children: 2  . Years of education: Not on file  . Highest education  level: Not on file  Social Needs  . Financial resource strain: Not on file  . Food insecurity - worry: Not on file  . Food insecurity - inability: Not on file  . Transportation needs - medical: Not on file  . Transportation needs - non-medical: Not on file  Occupational History  . Not on file  Tobacco Use  . Smoking status: Never Smoker  . Smokeless tobacco: Never Used  Substance and Sexual Activity  . Alcohol use: No  . Drug use: No  . Sexual activity: Not on file  Other Topics Concern  . Not on file  Social History Narrative  . Not on file    Family History  Problem Relation Age of Onset  . Peripheral Artery Disease Mother 34       HX OF POOR CIRCULATION, SMOKING, LEG AMPUTATION  . Cancer Father   . Asthma Brother 66  . Kidney Stones Brother   . Breast cancer Neg Hx     Review of Systems: Review of Systems  Constitutional: Negative.   HENT: Negative.   Eyes: Negative.   Respiratory: Positive for shortness of breath.   Cardiovascular: Negative.   Gastrointestinal: Negative.   Genitourinary: Negative.   Musculoskeletal: Positive for back pain and joint pain.  Skin: Negative.   Neurological: Negative.   Endo/Heme/Allergies: Negative.   Psychiatric/Behavioral: Positive for depression. The patient is nervous/anxious.    --------------------------------------------------------------------------------------------------  Physical Exam: BP 118/80   Pulse (!) 103   Ht 5' 7.5" (1.715 m)   Wt 190 lb (86.2 kg)   SpO2 95%   BMI 29.32 kg/m    General: Overweight woman, seated comfortably in the exam room. HEENT: No conjunctival pallor or scleral icterus. Moist mucous membranes.  OP clear. Neck: Supple without lymphadenopathy, thyromegaly, JVD, or HJR. No carotid bruit. Lungs: Normal work of breathing. Clear to auscultation bilaterally without wheezes or crackles. Heart: Tachycardic but regular without murmurs or rubs.  Nondisplaced PMI. Abd: Bowel sounds present.  Soft, NT/ND without hepatosplenomegaly Ext: No lower extremity edema. Radial, PT, and DP pulses are 2+ bilaterally. Skin: Warm and dry without rash.  EKG: Sinus tachycardia versus atrial tachycardia with left axis deviation.  No change from prior tracing on 06/07/17, at which time heart rate was 104 bpm.  --------------------------------------------------------------------------------------------------  ASSESSMENT AND PLAN: Dyspnea on exertion Symptoms are stable to slightly improved since her last visit.  Echocardiogram was notable for grade 1 diastolic dysfunction but otherwise no significant findings to explain her dyspnea.  Given multiple cardiac risk factors, including hypertension, hyperlipidemia, diabetes mellitus, and age, I have again recommended that we proceed with a pharmacologic myocardial perfusion stress test.  [name] is in agreement with this.  Tachycardia Heart rate today is again noted to be slightly elevated at 103 bpm.  Heart rate and rhythm is almost identical to tracing from 06/07/17.  Though P wave morphology is suggestive of sinus rhythm, atrial tachycardia is also consideration.  Unfortunately, records from her prior ablation in 2007 were not available.  We will assess her heart rate response on tomorrow stress test.  If it does not fluctuate appropriately, we will need to consider referring her to EP to  investigate potential atrial tachycardia that may also be contributing to her exertional dyspnea.  Hypertension Diastolic BP is borderline elevated. We will defer making any changes at this time.  Hyperlipidemia Continue pravastatin per Dr. Joylene Draft. If there is evidence of CAD on the stress test, we may need to consider switching to a high-intensity statin in the future.  Follow-up: Return to clinic in 3 months.  Nelva Bush, MD 09/01/2017 1:08 PM

## 2017-09-01 NOTE — Patient Instructions (Addendum)
Medication Instructions:   Bring a list of your medications tomorrow at your Stress Test  -- If you need a refill on your cardiac medications before your next appointment, please call your pharmacy. --  Labwork: None ordered  Testing/Procedures: None ordered  Follow-Up: Your physician wants you to follow-up in: 3 months with Dr. Saunders Revel.     Thank you for choosing [facility]!!    Any Other Special Instructions Will Be Listed Below (If Applicable).

## 2017-09-02 ENCOUNTER — Ambulatory Visit (HOSPITAL_COMMUNITY): Payer: Medicare Other | Attending: Cardiovascular Disease

## 2017-09-02 DIAGNOSIS — R0609 Other forms of dyspnea: Secondary | ICD-10-CM | POA: Diagnosis not present

## 2017-09-02 DIAGNOSIS — R0600 Dyspnea, unspecified: Secondary | ICD-10-CM

## 2017-09-02 LAB — MYOCARDIAL PERFUSION IMAGING
LV dias vol: 49 mL (ref 46–106)
LV sys vol: 18 mL
Peak HR: 123 {beats}/min
RATE: 0.32
Rest HR: 96 {beats}/min
SDS: 8
SRS: 8
SSS: 16
TID: 0.81

## 2017-09-02 MED ORDER — TECHNETIUM TC 99M TETROFOSMIN IV KIT
10.1000 | PACK | Freq: Once | INTRAVENOUS | Status: AC | PRN
  Administered 2017-09-02: 10.1 via INTRAVENOUS
  Filled 2017-09-02: qty 11

## 2017-09-02 MED ORDER — REGADENOSON 0.4 MG/5ML IV SOLN
0.4000 mg | Freq: Once | INTRAVENOUS | Status: AC
  Administered 2017-09-02: 0.4 mg via INTRAVENOUS

## 2017-09-02 MED ORDER — TECHNETIUM TC 99M TETROFOSMIN IV KIT
30.7000 | PACK | Freq: Once | INTRAVENOUS | Status: AC | PRN
  Administered 2017-09-02: 30.7 via INTRAVENOUS
  Filled 2017-09-02: qty 31

## 2017-09-02 NOTE — Addendum Note (Signed)
Addended by: Mendel Ryder on: 09/02/2017 10:52 AM   Modules accepted: Orders

## 2017-11-28 ENCOUNTER — Ambulatory Visit: Payer: Medicare Other | Admitting: Internal Medicine

## 2018-03-24 ENCOUNTER — Inpatient Hospital Stay (HOSPITAL_COMMUNITY)
Admission: EM | Admit: 2018-03-24 | Discharge: 2018-03-28 | DRG: 065 | Disposition: A | Discharge status: Rehabilitation Facility | Payer: Medicare Other | Attending: Internal Medicine | Admitting: Internal Medicine

## 2018-03-24 ENCOUNTER — Observation Stay (HOSPITAL_BASED_OUTPATIENT_CLINIC_OR_DEPARTMENT_OTHER): Payer: Medicare Other

## 2018-03-24 ENCOUNTER — Encounter (HOSPITAL_COMMUNITY): Payer: Self-pay | Admitting: Emergency Medicine

## 2018-03-24 ENCOUNTER — Observation Stay (HOSPITAL_COMMUNITY): Payer: Medicare Other

## 2018-03-24 ENCOUNTER — Emergency Department (HOSPITAL_COMMUNITY): Payer: Medicare Other

## 2018-03-24 ENCOUNTER — Other Ambulatory Visit: Payer: Self-pay

## 2018-03-24 DIAGNOSIS — Z9841 Cataract extraction status, right eye: Secondary | ICD-10-CM

## 2018-03-24 DIAGNOSIS — N184 Chronic kidney disease, stage 4 (severe): Secondary | ICD-10-CM | POA: Diagnosis present

## 2018-03-24 DIAGNOSIS — Z923 Personal history of irradiation: Secondary | ICD-10-CM

## 2018-03-24 DIAGNOSIS — Z85038 Personal history of other malignant neoplasm of large intestine: Secondary | ICD-10-CM

## 2018-03-24 DIAGNOSIS — I639 Cerebral infarction, unspecified: Principal | ICD-10-CM | POA: Diagnosis present

## 2018-03-24 DIAGNOSIS — I351 Nonrheumatic aortic (valve) insufficiency: Secondary | ICD-10-CM

## 2018-03-24 DIAGNOSIS — F411 Generalized anxiety disorder: Secondary | ICD-10-CM

## 2018-03-24 DIAGNOSIS — Z9842 Cataract extraction status, left eye: Secondary | ICD-10-CM

## 2018-03-24 DIAGNOSIS — Z7982 Long term (current) use of aspirin: Secondary | ICD-10-CM

## 2018-03-24 DIAGNOSIS — G459 Transient cerebral ischemic attack, unspecified: Secondary | ICD-10-CM | POA: Diagnosis not present

## 2018-03-24 DIAGNOSIS — I5032 Chronic diastolic (congestive) heart failure: Secondary | ICD-10-CM | POA: Diagnosis present

## 2018-03-24 DIAGNOSIS — R29703 NIHSS score 3: Secondary | ICD-10-CM | POA: Diagnosis present

## 2018-03-24 DIAGNOSIS — Z6831 Body mass index (BMI) 31.0-31.9, adult: Secondary | ICD-10-CM

## 2018-03-24 DIAGNOSIS — R471 Dysarthria and anarthria: Secondary | ICD-10-CM | POA: Diagnosis present

## 2018-03-24 DIAGNOSIS — F419 Anxiety disorder, unspecified: Secondary | ICD-10-CM | POA: Diagnosis present

## 2018-03-24 DIAGNOSIS — F329 Major depressive disorder, single episode, unspecified: Secondary | ICD-10-CM | POA: Diagnosis present

## 2018-03-24 DIAGNOSIS — G8194 Hemiplegia, unspecified affecting left nondominant side: Secondary | ICD-10-CM | POA: Diagnosis present

## 2018-03-24 DIAGNOSIS — Z87442 Personal history of urinary calculi: Secondary | ICD-10-CM

## 2018-03-24 DIAGNOSIS — Z79899 Other long term (current) drug therapy: Secondary | ICD-10-CM

## 2018-03-24 DIAGNOSIS — R29810 Facial weakness: Secondary | ICD-10-CM | POA: Diagnosis present

## 2018-03-24 DIAGNOSIS — Z853 Personal history of malignant neoplasm of breast: Secondary | ICD-10-CM

## 2018-03-24 DIAGNOSIS — E119 Type 2 diabetes mellitus without complications: Secondary | ICD-10-CM

## 2018-03-24 DIAGNOSIS — Z794 Long term (current) use of insulin: Secondary | ICD-10-CM

## 2018-03-24 DIAGNOSIS — I130 Hypertensive heart and chronic kidney disease with heart failure and stage 1 through stage 4 chronic kidney disease, or unspecified chronic kidney disease: Secondary | ICD-10-CM | POA: Diagnosis present

## 2018-03-24 DIAGNOSIS — N183 Chronic kidney disease, stage 3 unspecified: Secondary | ICD-10-CM

## 2018-03-24 DIAGNOSIS — E1122 Type 2 diabetes mellitus with diabetic chronic kidney disease: Secondary | ICD-10-CM | POA: Diagnosis present

## 2018-03-24 DIAGNOSIS — E669 Obesity, unspecified: Secondary | ICD-10-CM | POA: Diagnosis present

## 2018-03-24 DIAGNOSIS — E1151 Type 2 diabetes mellitus with diabetic peripheral angiopathy without gangrene: Secondary | ICD-10-CM | POA: Diagnosis present

## 2018-03-24 DIAGNOSIS — E785 Hyperlipidemia, unspecified: Secondary | ICD-10-CM | POA: Diagnosis present

## 2018-03-24 LAB — I-STAT CHEM 8, ED
BUN: 32 mg/dL — ABNORMAL HIGH (ref 8–23)
Calcium, Ion: 1.33 mmol/L (ref 1.15–1.40)
Chloride: 104 mmol/L (ref 98–111)
Creatinine, Ser: 2 mg/dL — ABNORMAL HIGH (ref 0.44–1.00)
Glucose, Bld: 227 mg/dL — ABNORMAL HIGH (ref 70–99)
HCT: 32 % — ABNORMAL LOW (ref 36.0–46.0)
Hemoglobin: 10.9 g/dL — ABNORMAL LOW (ref 12.0–15.0)
Potassium: 4.8 mmol/L (ref 3.5–5.1)
Sodium: 137 mmol/L (ref 135–145)
TCO2: 24 mmol/L (ref 22–32)

## 2018-03-24 LAB — COMPREHENSIVE METABOLIC PANEL
ALT: 42 U/L (ref 0–44)
AST: 43 U/L — ABNORMAL HIGH (ref 15–41)
Albumin: 3.4 g/dL — ABNORMAL LOW (ref 3.5–5.0)
Alkaline Phosphatase: 84 U/L (ref 38–126)
Anion gap: 8 (ref 5–15)
BUN: 30 mg/dL — ABNORMAL HIGH (ref 8–23)
CO2: 24 mmol/L (ref 22–32)
Calcium: 9.9 mg/dL (ref 8.9–10.3)
Chloride: 105 mmol/L (ref 98–111)
Creatinine, Ser: 1.95 mg/dL — ABNORMAL HIGH (ref 0.44–1.00)
GFR calc Af Amer: 27 mL/min — ABNORMAL LOW (ref >60)
GFR calc non Af Amer: 24 mL/min — ABNORMAL LOW (ref >60)
Glucose, Bld: 232 mg/dL — ABNORMAL HIGH (ref 70–99)
Potassium: 4.8 mmol/L (ref 3.5–5.1)
Sodium: 137 mmol/L (ref 135–145)
Total Bilirubin: 0.5 mg/dL (ref 0.3–1.2)
Total Protein: 6.1 g/dL — ABNORMAL LOW (ref 6.5–8.1)

## 2018-03-24 LAB — CBC
HCT: 32.6 % — ABNORMAL LOW (ref 36.0–46.0)
Hemoglobin: 10.9 g/dL — ABNORMAL LOW (ref 12.0–15.0)
MCH: 32.2 pg (ref 26.0–34.0)
MCHC: 33.4 g/dL (ref 30.0–36.0)
MCV: 96.2 fL (ref 78.0–100.0)
Platelets: 197 10*3/uL (ref 150–400)
RBC: 3.39 MIL/uL — ABNORMAL LOW (ref 3.87–5.11)
RDW: 13 % (ref 11.5–15.5)
WBC: 5.5 10*3/uL (ref 4.0–10.5)

## 2018-03-24 LAB — DIFFERENTIAL
Abs Immature Granulocytes: 0 10*3/uL (ref 0.0–0.1)
Basophils Absolute: 0 10*3/uL (ref 0.0–0.1)
Basophils Relative: 0 %
Eosinophils Absolute: 0.2 10*3/uL (ref 0.0–0.7)
Eosinophils Relative: 3 %
Immature Granulocytes: 0 %
Lymphocytes Relative: 23 %
Lymphs Abs: 1.3 10*3/uL (ref 0.7–4.0)
Monocytes Absolute: 0.5 10*3/uL (ref 0.1–1.0)
Monocytes Relative: 8 %
Neutro Abs: 3.6 10*3/uL (ref 1.7–7.7)
Neutrophils Relative %: 66 %

## 2018-03-24 LAB — APTT: aPTT: 27 seconds (ref 24–36)

## 2018-03-24 LAB — GLUCOSE, CAPILLARY
Glucose-Capillary: 162 mg/dL — ABNORMAL HIGH (ref 70–99)
Glucose-Capillary: 175 mg/dL — ABNORMAL HIGH (ref 70–99)
Glucose-Capillary: 172 mg/dL — ABNORMAL HIGH (ref 70–99)

## 2018-03-24 LAB — TROPONIN I
Troponin I: <0.03 ng/mL (ref <0.03)
Troponin I: <0.03 ng/mL (ref <0.03)

## 2018-03-24 LAB — PROTIME-INR
INR: 1.02
Prothrombin Time: 13.3 seconds (ref 11.4–15.2)

## 2018-03-24 LAB — I-STAT TROPONIN, ED: Troponin i, poc: 0 ng/mL (ref 0.00–0.08)

## 2018-03-24 LAB — ETHANOL: Alcohol, Ethyl (B): <10 mg/dL (ref <10)

## 2018-03-24 LAB — CBG MONITORING, ED: Glucose-Capillary: 214 mg/dL — ABNORMAL HIGH (ref 70–99)

## 2018-03-24 LAB — ECHOCARDIOGRAM COMPLETE
Height: 67 in
Weight: 3195.79 oz

## 2018-03-24 LAB — HEMOGLOBIN A1C
Hgb A1c MFr Bld: 8.2 % — ABNORMAL HIGH (ref 4.8–5.6)
Mean Plasma Glucose: 188.64 mg/dL

## 2018-03-24 MED ORDER — ACETAMINOPHEN 325 MG PO TABS
650.0000 mg | ORAL_TABLET | ORAL | Status: DC | PRN
  Administered 2018-03-27: 650 mg via ORAL
  Filled 2018-03-24: qty 2

## 2018-03-24 MED ORDER — SODIUM CHLORIDE 0.9 % IV SOLN
INTRAVENOUS | Status: DC
  Administered 2018-03-24 – 2018-03-25 (×3): via INTRAVENOUS

## 2018-03-24 MED ORDER — INSULIN ASPART 100 UNIT/ML ~~LOC~~ SOLN
0.0000 [IU] | SUBCUTANEOUS | Status: DC
  Administered 2018-03-24 (×2): 2 [IU] via SUBCUTANEOUS

## 2018-03-24 MED ORDER — ASPIRIN 325 MG PO TABS
325.0000 mg | ORAL_TABLET | Freq: Every day | ORAL | Status: DC
  Administered 2018-03-24 – 2018-03-28 (×5): 325 mg via ORAL
  Filled 2018-03-24 (×5): qty 1

## 2018-03-24 MED ORDER — CLOPIDOGREL BISULFATE 75 MG PO TABS
75.0000 mg | ORAL_TABLET | Freq: Every day | ORAL | Status: DC
  Administered 2018-03-25 – 2018-03-28 (×4): 75 mg via ORAL
  Filled 2018-03-24 (×4): qty 1

## 2018-03-24 MED ORDER — ARIPIPRAZOLE 10 MG PO TABS
5.0000 mg | ORAL_TABLET | Freq: Every day | ORAL | Status: DC
  Administered 2018-03-24 – 2018-03-28 (×5): 5 mg via ORAL
  Filled 2018-03-24 (×5): qty 1

## 2018-03-24 MED ORDER — VENLAFAXINE HCL ER 75 MG PO CP24
75.0000 mg | ORAL_CAPSULE | ORAL | Status: DC
  Administered 2018-03-26 – 2018-03-28 (×2): 75 mg via ORAL
  Filled 2018-03-24 (×3): qty 1

## 2018-03-24 MED ORDER — MIRTAZAPINE 15 MG PO TABS
15.0000 mg | ORAL_TABLET | Freq: Every day | ORAL | Status: DC
  Administered 2018-03-24 – 2018-03-27 (×4): 15 mg via ORAL
  Filled 2018-03-24 (×4): qty 1

## 2018-03-24 MED ORDER — ACETAMINOPHEN 160 MG/5ML PO SOLN: 650.0000 mg | ORAL | Status: DC | PRN

## 2018-03-24 MED ORDER — ASPIRIN EC 325 MG PO TBEC
325.0000 mg | DELAYED_RELEASE_TABLET | Freq: Once | ORAL | Status: DC
  Filled 2018-03-24: qty 1

## 2018-03-24 MED ORDER — CLOPIDOGREL BISULFATE 75 MG PO TABS
300.0000 mg | ORAL_TABLET | Freq: Once | ORAL | Status: AC
  Administered 2018-03-24: 300 mg via ORAL
  Filled 2018-03-24: qty 4

## 2018-03-24 MED ORDER — ENOXAPARIN SODIUM 40 MG/0.4ML ~~LOC~~ SOLN
40.0000 mg | SUBCUTANEOUS | Status: DC
  Administered 2018-03-24 – 2018-03-25 (×2): 40 mg via SUBCUTANEOUS
  Filled 2018-03-24 (×2): qty 0.4

## 2018-03-24 MED ORDER — LORAZEPAM 0.5 MG PO TABS
0.5000 mg | ORAL_TABLET | Freq: Four times a day (QID) | ORAL | Status: DC
  Administered 2018-03-24 – 2018-03-28 (×17): 0.5 mg via ORAL
  Filled 2018-03-24 (×18): qty 1

## 2018-03-24 MED ORDER — SENNOSIDES-DOCUSATE SODIUM 8.6-50 MG PO TABS
1.0000 | ORAL_TABLET | Freq: Every evening | ORAL | Status: DC | PRN
  Administered 2018-03-27: 1 via ORAL
  Filled 2018-03-24 (×2): qty 1

## 2018-03-24 MED ORDER — ATORVASTATIN CALCIUM 40 MG PO TABS
40.0000 mg | ORAL_TABLET | Freq: Every day | ORAL | Status: DC
  Administered 2018-03-24 – 2018-03-25 (×2): 40 mg via ORAL
  Filled 2018-03-24 (×2): qty 1

## 2018-03-24 MED ORDER — INSULIN ASPART PROT & ASPART (70-30 MIX) 100 UNIT/ML ~~LOC~~ SUSP
10.0000 [IU] | Freq: Two times a day (BID) | SUBCUTANEOUS | Status: DC
  Administered 2018-03-24 – 2018-03-28 (×8): 10 [IU] via SUBCUTANEOUS
  Filled 2018-03-24: qty 10

## 2018-03-24 MED ORDER — ACETAMINOPHEN 650 MG RE SUPP: 650.0000 mg | RECTAL | Status: DC | PRN

## 2018-03-24 MED ORDER — STROKE: EARLY STAGES OF RECOVERY BOOK
Freq: Once | Status: AC
  Administered 2018-03-24: 18:00:00

## 2018-03-24 MED ORDER — SODIUM CHLORIDE 0.9 % IV SOLN: Freq: Once | INTRAVENOUS | Status: DC

## 2018-03-24 NOTE — Progress Notes (Signed)
2D Echocardiogram has been performed.  [name] 03/24/2018, 2:40 PM

## 2018-03-24 NOTE — ED Triage Notes (Signed)
Pt to ED via [facility] from home-- CODE STROKE called prior to arrival.  LSN= [id number] Woke up at 0230 with unsteady gait per pt.  0730 woke up with speech slurred, left sided weakness-  Met at bridge by stroke team.

## 2018-03-24 NOTE — ED Notes (Signed)
Pt returned from MRI-- report called to 3W

## 2018-03-24 NOTE — H&P (Addendum)
[facility] History and Physical  [name] NWG:[id number] DOB: 1941/05/04 DOA: 03/24/2018  Referring physician: *  PCP: Crist Infante, MD   Chief Complaint:  Left-sided weakness  HPI:  77 year old female with a history of tachycardia status post ablation in 2007, hypertension, dyslipidemia, type 2 diabetes insulin-dependent, chronic kidney disease stage III, history of breast cancer, history of colon cancer, who presents to the ED this morning with left-sided weakness, left facial droop,  a code stroke was activated. Patient last felt  normal at bedtime last night. She woke up at 2:30 in the morning feeling unsteady on her feet. At first she thought that it was related to her lower back pain. Then she started experiencing left-sided weakness and left facial droop. She presented to the ED via EMS.EMS vital signs were 140/84, CBG 217, 100 HR.Initial NIHSS 3 due to left facial droop, left arm drift, and dysarthria. Initial CT was negative. Patient thought not to be a TPA candidate. She denied any chest pain shortness of breath palpitations , irregular heartbeat, history of recent DVT or PE.  Seen by neurology in the ED. CT Head: no acute intracranial abnormality. BP: 140/84 per EMS BG: 217 per EMS, Creatinine: 2.00 NIHSS:3 Patient now admitted for observation for stroke workup and rule out   Review of Systems: negative for the following  Constitutional: Denies fever, chills, diaphoresis, appetite change and fatigue.  HEENT: Denies photophobia, eye pain, redness, hearing loss, ear pain, congestion, sore throat, rhinorrhea, sneezing, mouth sores, trouble swallowing, neck pain, neck stiffness and tinnitus.  Respiratory: Denies SOB, DOE, cough, chest tightness, and wheezing.  Cardiovascular: Denies chest pain, palpitations and leg swelling.  Gastrointestinal: Denies nausea, vomiting, abdominal pain, diarrhea, constipation, blood in stool and abdominal distention.  Genitourinary: Denies  dysuria, urgency, frequency, hematuria, flank pain and difficulty urinating.  Musculoskeletal: Denies myalgias, back pain, joint swelling, arthralgias and gait problem.  Skin: Denies pallor, rash and wound.  Neurological:  Left-sided weakness, facial droop Hematological: Denies adenopathy. Easy bruising, personal or family bleeding history  Psychiatric/Behavioral: Denies suicidal ideation, mood changes, confusion, nervousness, sleep disturbance and agitation       Past Medical History:  Diagnosis Date  . Anxiety   . Back ache   . Breast cancer ([place])    right  . Bronchitis   . Cancer of colon ([place])   . Depression   . DM (diabetes mellitus) ([place])   . Dyspnea on exertion   . Fall on steps    age 71 feel down iron stairs couldnot move leg for 6 mths  . Family history of primary TB   . Fracture of ankle, closed 2014   left  . History of chicken pox   . History of primary TB   . Hyperlipidemia   . Hypertension   . Kidney disease, chronic, stage III (moderate, EGFR 30-59 ml/min) ([place]) 2017  . Kidney disorder    reduction function  . Kidney stones   . Knee pain, right   . Lung abnormality    age 69 patient had pna spot on lungs   . Microalbuminuria   . Muscle cramps    both legs  . Muscular degeneration    of right eye  . Nephrolithiasis   . Palpitations   . Personal history of radiation therapy      Past Surgical History:  Procedure Laterality Date  . ABLATION  2007   fast heart rate  . ANKLE FRACTURE SURGERY Left 2014  . BREAST LUMPECTOMY Right   .  CATARACT EXTRACTION Right 2017  . colonscopy  2015  . HYSTEROTOMY     45 years ago  . TONSILLECTOMY AND ADENOIDECTOMY     age 69      Social History:  reports that she has never smoked. She has never used smokeless tobacco. She reports that she does not drink alcohol or use drugs.    No Known Allergies  Family History  Problem Relation Age of Onset  . Peripheral Artery Disease Mother 91       HX OF POOR  CIRCULATION, SMOKING, LEG AMPUTATION  . Cancer Father   . Asthma Brother 76  . Kidney Stones Brother   . Breast cancer Neg Hx        ,   Prior to Admission medications   Medication Sig Start Date End Date Taking? Authorizing Provider  amLODipine (NORVASC) 5 MG tablet Take 5 mg by mouth daily.   Yes [provider]  ARIPiprazole (ABILIFY) 5 MG tablet Take 5 mg daily by mouth. 03/30/17  Yes [provider]  aspirin EC 81 MG tablet Take 81 mg by mouth daily.   Yes [provider]  calcium carbonate (OSCAL) 1500 (600 Ca) MG TABS tablet Take 600 mg of elemental calcium by mouth 2 (two) times daily with a meal.   Yes [provider]  HUMALOG MIX 75/25 KWIKPEN (75-25) 100 UNIT/ML Kwikpen Take 38-44 Units by mouth See admin instructions. Take 44 units in the morning and 38 units in the evening 03/19/18  Yes [provider]  Icosapent Ethyl (VASCEPA) 1 g CAPS Take 1 g by mouth 2 (two) times daily.   Yes [provider]  linagliptin (TRADJENTA) 5 MG TABS tablet Take 5 mg by mouth daily.   Yes [provider]  lisinopril (PRINIVIL,ZESTRIL) 10 MG tablet Take 10 mg by mouth daily.   Yes [provider]  LORazepam (ATIVAN) 0.5 MG tablet Take 0.5 mg by mouth 4 (four) times daily.   Yes [provider]  metFORMIN (GLUCOPHAGE) 500 MG tablet Take 500 mg by mouth 2 (two) times daily with a meal.   Yes [provider]  mirtazapine (REMERON) 15 MG tablet Take 15 mg by mouth at bedtime.   Yes [provider]  multivitamin-lutein (OCUVITE-LUTEIN) CAPS capsule Take 2 capsules by mouth daily.   Yes [provider]  omega-3 acid ethyl esters (LOVAZA) 1 g capsule Take 1 g by mouth 2 (two) times daily.   Yes [provider]  pravastatin (PRAVACHOL) 20 MG tablet Take 20 mg by mouth at bedtime.    Yes [provider]  venlafaxine XR (EFFEXOR-XR) 75 MG 24 hr capsule Take 75 mg by mouth every other  day.   Yes [provider]     Physical Exam: Vitals:   03/24/18 0813 03/24/18 0818 03/24/18 0831  BP:   123/71  Pulse:   88  Resp:   16  Temp:   97.9 F (36.6 C)  TempSrc:   Oral  SpO2: 97%  100%  Weight:  90.6 kg   Height:  5' 7"  (1.702 m)         Vitals:   03/24/18 0813 03/24/18 0818 03/24/18 0831  BP:   123/71  Pulse:   88  Resp:   16  Temp:   97.9 F (36.6 C)  TempSrc:   Oral  SpO2: 97%  100%  Weight:  90.6 kg   Height:  5' 7"  (1.702 m)    Constitutional:  NAD, calm, comfortable Eyes: PERRL, lids and conjunctivae normal ENMT: Mucous membranes are moist. Posterior pharynx clear of any exudate or lesions.Normal dentition.  Neck: normal, supple, no masses, no thyromegaly Respiratory: clear to auscultation bilaterally, no wheezing, no crackles. Normal respiratory effort. No accessory muscle use.  Cardiovascular: Regular rate and rhythm, no murmurs / rubs / gallops. No extremity edema. 2+ pedal pulses. No carotid bruits.  Abdomen: no tenderness, no masses palpated. No hepatosplenomegaly. Bowel sounds positive.  Musculoskeletal: no clubbing / cyanosis. No joint deformity upper and lower extremities. Good ROM, no contractures. Normal muscle tone.  Skin: no rashes, lesions, ulcers. No induration Neurologic:  4  /5 strength left upper extremity, left facial droop noted, tongue in the midline, Psychiatric: Normal judgment and insight. Alert and oriented x 3. Normal mood.     Labs on Admission: I have personally reviewed following labs and imaging studies  CBC: Recent Labs  Lab 03/24/18 0801 03/24/18 0814  WBC 5.5  --   NEUTROABS 3.6  --   HGB 10.9* 10.9*  HCT 32.6* 32.0*  MCV 96.2  --   PLT 197  --     Basic Metabolic Panel: Recent Labs  Lab 03/24/18 0814  NA 137  K 4.8  CL 104  GLUCOSE 227*  BUN 32*  CREATININE 2.00*    GFR: Estimated Creatinine Clearance: 27.2 mL/min (A) (by C-G formula based on SCr of 2 mg/dL (H)).  Liver Function  Tests: No results for input(s): AST, ALT, ALKPHOS, BILITOT, PROT, ALBUMIN in the last 168 hours. No results for input(s): LIPASE, AMYLASE in the last 168 hours. No results for input(s): AMMONIA in the last 168 hours.  Coagulation Profile: Recent Labs  Lab 03/24/18 0801  INR 1.02   No results for input(s): DDIMER in the last 72 hours.  Cardiac Enzymes: No results for input(s): CKTOTAL, CKMB, CKMBINDEX, TROPONINI in the last 168 hours.  BNP (last 3 results) No results for input(s): PROBNP in the last 8760 hours.  HbA1C: No results for input(s): HGBA1C in the last 72 hours. No results found for: HGBA1C   CBG: Recent Labs  Lab 03/24/18 0801  GLUCAP 214*    Lipid Profile: No results for input(s): CHOL, HDL, LDLCALC, TRIG, CHOLHDL, LDLDIRECT in the last 72 hours.  Thyroid Function Tests: No results for input(s): TSH, T4TOTAL, FREET4, T3FREE, THYROIDAB in the last 72 hours.  Anemia Panel: No results for input(s): VITAMINB12, FOLATE, FERRITIN, TIBC, IRON, RETICCTPCT in the last 72 hours.  Urine analysis: No results found for: COLORURINE, APPEARANCEUR, LABSPEC, PHURINE, GLUCOSEU, HGBUR, BILIRUBINUR, KETONESUR, PROTEINUR, UROBILINOGEN, NITRITE, LEUKOCYTESUR  Sepsis Labs: '@LABRCNTIP'$ (procalcitonin:4,lacticidven:4) )No results found for this or any previous visit (from the past 240 hour(s)).       Radiological Exams on Admission: Ct Head Code Stroke Wo Contrast  Result Date: 03/24/2018 CLINICAL DATA:  Code stroke.  Left-sided weakness. EXAM: CT HEAD WITHOUT CONTRAST TECHNIQUE: Contiguous axial images were obtained from the base of the skull through the vertex without intravenous contrast. COMPARISON:  None. FINDINGS: Brain: There is no evidence of acute infarct, intracranial hemorrhage, mass, midline shift, or extra-axial fluid collection. The ventricles and sulci are within normal limits for age. Scattered cerebral white matter hypodensities are nonspecific but compatible  with mild chronic small vessel ischemic disease. Vascular: Calcified atherosclerosis at the skull base. No hyperdense vessel. Skull: No fracture or focal osseous lesion. Sinuses/Orbits: Visualized paranasal sinuses and mastoid air cells are clear. Bilateral cataract extraction is noted. Other: None. ASPECTS [facility] Stroke Program  Early CT Score) - Ganglionic level infarction (caudate, lentiform nuclei, internal capsule, insula, M1-M3 cortex): 7 - Supraganglionic infarction (M4-M6 cortex): 3 Total score (0-10 with 10 being normal): 10 IMPRESSION: 1. No evidence of acute intracranial abnormality. 2. ASPECTS is 10. 3. Mild chronic small vessel ischemic disease. These results were communicated to Dr. Lorraine Lax at 8:19 am on 03/24/2018 by text page via the [facility] messaging system. Electronically Signed   By: Logan Bores M.D.   On: 03/24/2018 08:20   Ct Head Code Stroke Wo Contrast  Result Date: 03/24/2018 CLINICAL DATA:  Code stroke.  Left-sided weakness. EXAM: CT HEAD WITHOUT CONTRAST TECHNIQUE: Contiguous axial images were obtained from the base of the skull through the vertex without intravenous contrast. COMPARISON:  None. FINDINGS: Brain: There is no evidence of acute infarct, intracranial hemorrhage, mass, midline shift, or extra-axial fluid collection. The ventricles and sulci are within normal limits for age. Scattered cerebral white matter hypodensities are nonspecific but compatible with mild chronic small vessel ischemic disease. Vascular: Calcified atherosclerosis at the skull base. No hyperdense vessel. Skull: No fracture or focal osseous lesion. Sinuses/Orbits: Visualized paranasal sinuses and mastoid air cells are clear. Bilateral cataract extraction is noted. Other: None. ASPECTS [facility] Stroke Program Early CT Score) - Ganglionic level infarction (caudate, lentiform nuclei, internal capsule, insula, M1-M3 cortex): 7 - Supraganglionic infarction (M4-M6 cortex): 3 Total score (0-10 with 10 being normal):  10 IMPRESSION: 1. No evidence of acute intracranial abnormality. 2. ASPECTS is 10. 3. Mild chronic small vessel ischemic disease. These results were communicated to Dr. Lorraine Lax at 8:19 am on 03/24/2018 by text page via the [facility] messaging system. Electronically Signed   By: Logan Bores M.D.   On: 03/24/2018 08:20      EKG: Independently reviewed.  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 MRA of the brain pending 2-D echo Aspirin 325 mg daily plus Plavix 75 mg daily Hemoglobin A1c, lipid panel PT/OT/H Telemetry monitoring Neurochecks Stroke swallow screen Speech therapy evaluation  Diabetes type 2 insulin-dependent She is on Humalog 75/25, tradjenta, Glucophage We'll start her on insulin 70/30, sliding scale insulin  Dyslipidemia Will start patient on Lipitor at 40 mg per day if she tolerates increased 80 mg a day  Chronic  kidney disease , stage III to 4, baseline creatinine unknown, presents with a creatinine of 2.0 Hold ACE inhibitor ,trend creatinine  Hypertension Hold antihypertensives Permissive  Hypertension in the setting of stroke workupnext  Anxiety Continue Abilify, Remeron     DVT prophylaxis:  Lovenox     consults called:neurology  Family Communication: Admission, patients condition and plan of care including tests being ordered have been discussed with the patient  who indicates understanding and agree with the plan and Code Status   Admission status: observation      Disposition plan: Further plan will depend as patient's clinical course evolves and further radiologic and laboratory data become available. Likely home when stable       Reyne Dumas MD [facility] Pager [phone]  If 7PM-7AM, please contact  night-coverage [email or web address] Password [email or web address]  03/24/2018, 9:23 AM

## 2018-03-24 NOTE — Progress Notes (Signed)
Occupational Therapy Evaluation Patient Details Name: [name] MRN: [id number] DOB: September 03, 1940 Today's Date: 03/24/2018    History of Present Illness 77 year old female with a history of tachycardia status post ablation in 2007, hypertension, dyslipidemia, type 2 diabetes insulin-dependent, chronic kidney disease stage III, history of breast cancer, history of colon cancer, who presents to the ED this morning with left-sided weakness, left facial droopNIHSS 3 due to left facial droop, left arm drift, and dysarthria. 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    Clinical Impression   PTA, pt lived alone and was independent with ADL and mobility, including IADL tasks and driving. Pt was responsible for her medications but daughter in law assists with finances. Pt demonstrates significant functional status change, requiring mod A with mobility and ADL and would benefit from CIR to facilitate return to PLOF. Pt very motivated to return to independence and spend time with her grandchildren. Will follow acutely to address established goals.     Follow Up Recommendations  CIR;Supervision/Assistance - 24 hour    Equipment Recommendations  3 in 1 bedside commode;Tub/shower bench    Recommendations for Other Services Rehab consult     Precautions / Restrictions Precautions Precautions: Fall      Mobility Bed Mobility Overal bed mobility: Needs Assistance Bed Mobility: Supine to Sit     Supine to sit: Mod assist     General bed mobility comments: vc to problem solve how to move toward L affected side. Assit to elevate trunk from sidelying.   Transfers Overall transfer level: Needs assistance   Transfers: Sit to/from Stand;Stand Pivot Transfers Sit to Stand: Mod assist Stand pivot transfers: Mod assist(appaent coordination deficits)       General transfer comment: posterior lean able to correct with vc adn tactile cues    Balance Overall balance  assessment: Needs assistance   Sitting balance-Leahy Scale: Fair Sitting balance - Comments: L bias initially     Standing balance-Leahy Scale: Poor                             ADL either performed or assessed with clinical judgement   ADL Overall ADL's : Needs assistance/impaired Eating/Feeding: Set up;Sitting   Grooming: Sitting   Upper Body Bathing: Minimal assistance   Lower Body Bathing: Moderate assistance;Sit to/from stand   Upper Body Dressing : Moderate assistance;Sitting   Lower Body Dressing: Moderate assistance;Sit to/from stand   Toilet Transfer: Moderate assistance;Stand-pivot   Toileting- Clothing Manipulation and Hygiene: Moderate assistance       Functional mobility during ADLs: Moderate assistance(A to lift and control descent)       Vision Baseline Vision/History: Wears glasses;Macular Degeneration Wears Glasses: Reading only Vision Assessment?: Yes Eye Alignment: Within Functional Limits Ocular Range of Motion: Within Functional Limits Alignment/Gaze Preference: Within Defined Limits Tracking/Visual Pursuits: Decreased smoothness of horizontal tracking;Decreased smoothness of vertical tracking Saccades: Additional head turns occurred during testing Convergence: Within functional limits Visual Fields: No apparent deficits Additional Comments: will further assess     Perception Perception Comments: will further assess   Praxis Praxis Praxis tested?: Within functional limits    Pertinent Vitals/Pain Pain Assessment: No/denies pain     Hand Dominance Right   Extremity/Trunk Assessment Upper Extremity Assessment Upper Extremity Assessment: LUE deficits/detail LUE Deficits / Details: isolated movement out of synergy pattern. able to touch hand to mouth; able to raise RUE to @ 60 FF due to proximal weakness; full  gross grasp/release with extensor lag; poor in-hand manipulation skills; attempting to use functionally; @ [place]  stage V LUE Coordination: decreased fine motor;decreased gross motor   Lower Extremity Assessment Lower Extremity Assessment: Defer to PT evaluation   Cervical / Trunk Assessment Cervical / Trunk Assessment: Other exceptions Cervical / Trunk Exceptions: R bias   Communication Communication Communication: Expressive difficulties   Cognition Arousal/Alertness: Awake/alert Behavior During Therapy: Anxious Overall Cognitive Status: Impaired/Different from baseline Area of Impairment: Attention;Memory;Awareness                   Current Attention Level: Selective Memory: Decreased short-term memory     Awareness: Emergent       General Comments       Exercises Exercises: Other exercises Other Exercises Other Exercises: BUE integrated activities - folding towels Other Exercises: screwing top/unscrewing top on lotion bottle   Shoulder Instructions      Home Living Family/patient expects to be discharged to:: Private residence Living Arrangements: Alone Available Help at Discharge: Family Type of Home: Other(Comment) Home Access: (town home)     Home Layout: One level     Bathroom Shower/Tub: Corporate investment banker: Standard Bathroom Accessibility: Yes How Accessible: Accessible via walker Home Equipment: None          Prior Functioning/Environment Level of Independence: Independent        Comments: drove; medication management; family does financial mangement        OT Problem List: Decreased strength;Decreased range of motion;Impaired balance (sitting and/or standing);Decreased coordination;Decreased cognition;Decreased safety awareness;Decreased knowledge of use of DME or AE;Obesity;Impaired UE functional use      OT Treatment/Interventions: Self-care/ADL training;Therapeutic exercise;Neuromuscular education;DME and/or AE instruction;Therapeutic activities;Cognitive remediation/compensation;Patient/family education;Balance  training    OT Goals(Current goals can be found in the care plan section) Acute Rehab OT Goals Patient Stated Goal: to return to being independent OT Goal Formulation: With patient/family Time For Goal Achievement: 04/07/18 Potential to Achieve Goals: Good  OT Frequency: Min 2X/week   Barriers to D/C:            Co-evaluation              AM-PAC PT "6 Clicks" Daily Activity     Outcome Measure Help from another person eating meals?: A Little Help from another person taking care of personal grooming?: A Little Help from another person toileting, which includes using toliet, bedpan, or urinal?: A Lot Help from another person bathing (including washing, rinsing, drying)?: A Lot Help from another person to put on and taking off regular upper body clothing?: A Lot Help from another person to put on and taking off regular lower body clothing?: A Lot 6 Click Score: 14   End of Session Equipment Utilized During Treatment: Gait belt Nurse Communication: Mobility status  Activity Tolerance: Patient tolerated treatment well Patient left: in chair;with call bell/phone within reach  OT Visit Diagnosis: Unsteadiness on feet (R26.81);Other abnormalities of gait and mobility (R26.89);Muscle weakness (generalized) (M62.81);Ataxia, unspecified (R27.0);Other symptoms and signs involving cognitive function                Time: 1510-1535 OT Time Calculation (min): 25 min Charges:  OT General Charges $OT Visit: 1 Visit OT Evaluation $OT Eval Moderate Complexity: 1 Mod OT Treatments $Self Care/Home Management : 8-22 mins  Maurie Boettcher, OT/L  OT Clinical Specialist [phone]   [facility] 03/24/2018, 5:13 PM

## 2018-03-24 NOTE — Progress Notes (Signed)
OT Treatment Note  Pt seen for additional session with PT to further assess mobility. Pt required +2 Mod A with hand held ambulation. Recommend CIR for rehab.     03/24/18 1718  OT Visit Information  Last OT Received On 03/24/18  Assistance Needed +1 (+2 for safety)  PT/OT/SLP Co-Evaluation/Treatment Yes (partial session)  Reason for Co-Treatment To address functional/ADL transfers;For patient/therapist safety  OT goals addressed during session ADL's and self-care  History of Present Illness 77 year old female with a history of tachycardia status post ablation in 2007, hypertension, dyslipidemia, type 2 diabetes insulin-dependent, chronic kidney disease stage III, history of breast cancer, history of colon cancer, who presents to the ED this morning with left-sided weakness, left facial droopNIHSS 3 due to left facial droop, left arm drift, and dysarthria. 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   Precautions  Precautions Fall  Pain Assessment  Pain Assessment No/denies pain  Cognition  Arousal/Alertness Awake/alert  Behavior During Therapy Anxious  Overall Cognitive Status Impaired/Different from baseline  Area of Impairment Attention;Memory;Awareness  Current Attention Level Selective  Memory Decreased short-term memory  Awareness Emergent  Bed Mobility  Overal bed mobility Needs Assistance  Bed Mobility Sit to Supine  Sit to supine Mod assist  Balance  Standing balance-Leahy Scale Poor  Transfers  Overall transfer level Needs assistance  Sit to Stand Mod assist  General Comments  General comments (skin integrity, edema, etc.) Pt seen an additional session with  PT to further assess ambulation. Pt able to ambulate with +2 Mod A with notable coordination deficits.   OT - End of Session  Equipment Utilized During Treatment Gait belt  Activity Tolerance Patient tolerated treatment well  Patient left in bed;with call bell/phone within reach;with bed  alarm set;with family/visitor present  Nurse Communication Mobility status  OT Assessment/Plan  OT Plan Discharge plan remains appropriate  OT Visit Diagnosis Unsteadiness on feet (R26.81);Other abnormalities of gait and mobility (R26.89);Muscle weakness (generalized) (M62.81);Ataxia, unspecified (R27.0);Other symptoms and signs involving cognitive function  OT Frequency (ACUTE ONLY) Min 2X/week  Recommendations for Other Services Rehab consult  Follow Up Recommendations CIR;Supervision/Assistance - 24 hour  OT Equipment 3 in 1 bedside commode;Tub/shower bench  AM-PAC OT "6 Clicks" Daily Activity Outcome Measure  Help from another person eating meals? 3  Help from another person taking care of personal grooming? 3  Help from another person toileting, which includes using toliet, bedpan, or urinal? 2  Help from another person bathing (including washing, rinsing, drying)? 2  Help from another person to put on and taking off regular upper body clothing? 2  Help from another person to put on and taking off regular lower body clothing? 2  6 Click Score 14  ADL G Code Conversion CK  OT Goal Progression  Progress towards OT goals Progressing toward goals  Acute Rehab OT Goals  Patient Stated Goal to return to being independent  OT Goal Formulation With patient/family  Time For Goal Achievement 04/07/18  Potential to Achieve Goals Good  ADL Goals  Pt Will Perform Grooming with modified independence;standing  Pt Will Perform Upper Body Bathing with modified independence;sitting  Pt Will Perform Lower Body Bathing with modified independence;sit to/from stand  Pt Will Perform Upper Body Dressing with modified independence;sitting  Pt Will Perform Lower Body Dressing with modified independence;sit to/from stand  Pt Will Transfer to Toilet with modified independence;ambulating;bedside commode  Pt Will Perform Toileting - Clothing Manipulation and hygiene with modified independence;sit to/from  stand  OT Time Calculation  OT Start Time (ACUTE ONLY) 1646  OT Stop Time (ACUTE ONLY) 1700  OT Time Calculation (min) 14 min  OT General Charges  $OT Visit 1 Visit  OT Treatments  $Self Care/Home Management  8-22 mins  Maurie Boettcher, OT/L  OT Clinical Specialist [phone]

## 2018-03-24 NOTE — ED Notes (Signed)
Pt passed swallow screen

## 2018-03-24 NOTE — ED Notes (Signed)
Pt CBG 214

## 2018-03-24 NOTE — Progress Notes (Signed)
*  PRELIMINARY RESULTS* Vascular Ultrasound Carotid Duplex (Doppler) has been completed.   Findings suggest 1-39% internal carotid artery stenosis bilaterally. Vertebral arteries are patent with antegrade flow.  03/24/2018 2:59 PM Maudry Mayhew, MHA, RVT, RDCS, RDMS

## 2018-03-24 NOTE — Consult Note (Addendum)
NEURO HOSPITALIST      Requesting Physician: Dr. Johnney Killian    Chief Complaint:  Left side weakness, left facial droop  History obtained from:  Patient   HPI:                                                                                                                                         [name] is an 77 y.o. female PMH of HTN, HLD, DM, and right breast cancer who presented to [facility] ED as a code stroke for left side weakness and left facial droop.   Patient states that she was in her usual state of health last night before bed 2030 and when she talked to her son at 58. She woke up at 0200 and noticed some unsteady gait. She returned to bed and at 0700 this morning she noticed the slurred speech, left facial droop and weakness. Denies any HA, SOB, CP, smoking, ETOH, vision changes. Patient wears glasses for " small words" at baseline. She does live alone and does not use a cane or walker to ambulate. Denies a fib or any prior stroke history.  ED course:  CT Head: no acute intracranial abnormality NIHSS 3,CTA not done as not clinically LVO and patient has impaired renal function. Stroke alert cancelled as not a tPA candidate and negative for LVO.  BP: 140/84 per EMS BG: 217 per EMS, Creatinine: 2.00  Date last known well: Date: 03/23/2018 Time last known well: Time: 20:30 tPA Given: No: contraindicated, outside of window Modified Rankin: Rankin Score=1  NIHSS:3 1a Level of Conscious:0 1b LOC Questions:0  1c LOC Commands: 0 2 Best Gaze: 0 3 Visual: 0 4 Facial Palsy: 1 5a Motor Arm - left: 1 5b Motor Arm - Right: 0 6a Motor Leg - Left: 0 6b Motor Leg - Right:0  7 Limb Ataxia: 0 8 Sensory:0  9 Best Language: 0 10 Dysarthria:1 11 Extinct. and Inattention:0 TOTAL: 3  Past Medical History:  Diagnosis Date  . Anxiety   . Back ache   . Breast cancer ([place])    right  . Bronchitis   . Cancer of colon ([place])   . Depression    . DM (diabetes mellitus) ([place])   . Dyspnea on exertion   . Fall on steps    age 39 feel down iron stairs couldnot move leg for 6 mths  . Family history of primary TB   . Fracture of ankle, closed 2014   left  . History of chicken pox   . History of primary TB   . Hyperlipidemia   . Hypertension   . Kidney disease,  chronic, stage III (moderate, EGFR 30-59 ml/min) ([place]) 2017  . Kidney disorder    reduction function  . Kidney stones   . Knee pain, right   . Lung abnormality    age 60 patient had pna spot on lungs   . Microalbuminuria   . Muscle cramps    both legs  . Muscular degeneration    of right eye  . Nephrolithiasis   . Palpitations   . Personal history of radiation therapy     Past Surgical History:  Procedure Laterality Date  . ABLATION  2007   fast heart rate  . ANKLE FRACTURE SURGERY Left 2014  . BREAST LUMPECTOMY Right   . CATARACT EXTRACTION Right 2017  . colonscopy  2015  . HYSTEROTOMY     45 years ago  . TONSILLECTOMY AND ADENOIDECTOMY     age 56    Family History  Problem Relation Age of Onset  . Peripheral Artery Disease Mother 35       HX OF POOR CIRCULATION, SMOKING, LEG AMPUTATION  . Cancer Father   . Asthma Brother 54  . Kidney Stones Brother   . Breast cancer Neg Hx     Social History:  reports that she has never smoked. She has never used smokeless tobacco. She reports that she does not drink alcohol or use drugs.  Allergies: No Known Allergies  Medications:                                                                                                                          No current facility-administered medications for this encounter.    Current Outpatient Medications  Medication Sig Dispense Refill  . amLODipine (NORVASC) 5 MG tablet Take 5 mg by mouth daily.    . ARIPiprazole (ABILIFY) 5 MG tablet Take 5 mg daily by mouth.  3  . aspirin EC 81 MG tablet Take 81 mg by mouth daily.    Vanessa Kick Ethyl (VASCEPA) 1 g CAPS Take 1 g  by mouth 2 (two) times daily.    Marland Kitchen linagliptin (TRADJENTA) 5 MG TABS tablet Take 5 mg by mouth daily.    Marland Kitchen lisinopril (PRINIVIL,ZESTRIL) 10 MG tablet Take 10 mg by mouth daily.    Marland Kitchen LORazepam (ATIVAN) 0.5 MG tablet Take 0.5 mg by mouth 4 (four) times daily.    . metFORMIN (GLUCOPHAGE) 500 MG tablet Take 500 mg by mouth 2 (two) times daily with a meal.    . mirtazapine (REMERON) 15 MG tablet Take 15 mg by mouth at bedtime.    . multivitamin-lutein (OCUVITE-LUTEIN) CAPS capsule Take 2 capsules by mouth daily.    Marland Kitchen omega-3 acid ethyl esters (LOVAZA) 1 g capsule Take 1 g by mouth 2 (two) times daily.    . pravastatin (PRAVACHOL) 20 MG tablet Take 20 mg by mouth daily.    Marland Kitchen venlafaxine XR (EFFEXOR-XR) 150 MG 24 hr capsule Take 150 mg by mouth every other day.    Marland Kitchen  venlafaxine XR (EFFEXOR-XR) 75 MG 24 hr capsule Take 75 mg by mouth every other day.       ROS:                                                                                                                                       History obtained from the patient  General ROS: negative for - chills, fatigue, fever, night sweats, weight gain or weight loss Psychological ROS: negative for - , hallucinations, memory difficulties, mood swings or  Ophthalmic ROS: negative for - blurry vision, double vision, eye pain or loss of vision Respiratory ROS: negative for - cough,  shortness of breath or wheezing Cardiovascular ROS: negative for - chest pain, dyspnea on exertion,  Musculoskeletal ROS: negative for - joint swelling or muscular weakness Neurological ROS: as noted in HPI   General Examination:                                                                                                      Height _0  (1.702 m), weight 90.6 kg, SpO2 97 %.  HEENT-  Normocephalic, no lesions, without obvious abnormality.  Normal external eye and conjunctiva. Cardiovascular- S1-S2 audible, pulses palpable throughout   Lungs-no rhonchi or wheezing  noted, no excessive working breathing.  Saturations within normal limits on RA Extremities- Warm, dry and intact Musculoskeletal-no joint tenderness, deformity or swelling Skin-warm and dry, no hyperpigmentation, vitiligo, or suspicious lesions, many skin moles present  Neurological Examination Mental Status: Alert, oriented,age/ month/ place/ situation.  Speech fluent without evidence of aphasia. mild dysarthria. Able to follow commands without difficulty. Cranial Nerves: II:  Visual fields grossly normal,  III,IV, VI: ptosis not present, extra-ocular motions intact bilaterally, pupils equal, round, reactive to light and accommodation V,VII: smile asymmetric, light left facial droop noted, facial light touch sensation normal bilaterally VIII: hearing normal bilaterally IX,X: uvula rises symmetrically XI: bilateral shoulder shrug XII: midline tongue extension Motor: Right : Upper extremity   5/5    Left:     Upper extremity   5/5  Lower extremity   5/5     Lower extremity   5/5 Tone and bulk:normal tone throughout; no atrophy noted Sensory:light touch intact throughout, bilaterally Deep Tendon Reflexes: 2+ and symmetric biceps and patella Plantars: Right: downgoing   Left: downgoing Cerebellar: normal finger-to-nose,  normal heel-to-shin test Gait: deferred   Lab Results: Basic Metabolic Panel: Recent Labs  Lab 03/24/18 0814  NA 137  K 4.8  CL 104  GLUCOSE 227*  BUN 32*  CREATININE 2.00*    CBC: Recent Labs  Lab 03/24/18 0814  HGB 10.9*  HCT 32.0*    Lipid Panel: No results for input(s): CHOL, TRIG, HDL, CHOLHDL, VLDL, LDLCALC in the last 168 hours.  CBG: No results for input(s): GLUCAP in the last 168 hours.  Imaging: Ct Head Code Stroke Wo Contrast  Result Date: 03/24/2018  IMPRESSION: 1. No evidence of acute intracranial abnormality. 2. ASPECTS is 10. 3. Mild chronic small vessel ischemic disease.    Laurey Morale, MSN, NP-C [facility] [phone]  03/24/2018, 8:21 AM   Attending physician note to follow with Assessment and plan .   Assessment: 77 y.o. female PMH of HTN, HLD, DM, and right breast cancer who presented to [facility] ED as a code stroke for left side weakness and left facial droop. Code stroke was canceled after CT Head and examination. CT: showed no acute intracranial abnormality. MRI  Pending. Further stroke work- up needed.    Impression: Stroke Risk Factors - diabetes mellitus, hyperlipidemia and hypertension Etiology : further sdtroke work- up needed   Recommend -- BP goal : Permissive HTN upto 220/110 mmHg  --MRI Brain  --MRA of the head w/o and neck without contrast   --Echocardiogram --ASA 325 mg and plavix 75 mg ( after 355m load) -- High intensity Statin for LDL > 70 -- HgbA1c, fasting lipid panel -- PT consult, OT consult, Speech consult --Telemetry monitoring --Frequent neuro checks --Stroke swallow screen    --please page stroke NP  Or  PA  Or MD from 8am -4 pm  as this patient from this time will be  followed by the stroke.   You can look them up on [email or web address]  Password [email or web address]    NEUROHOSPITALIST ADDENDUM Performed a face to face diagnostic evaluation.  I have reviewed the contents of history and physical exam as documented by PA/ARNP/Resident and agree with above documentation.  I have discussed and formulated the above plan as documented. Edits to the note have been made as needed.   Impression: Acute stroke, likely right side lacunar infarct. Outside window for tPA as LKN yesterday night. Clinically not LVO. MRI brain shows small pontine perforator infarct.  MRA does not show any large vessel occlusion. Key exam findings: Mild left hemiparesis and sensory symptoms Plan: Dual antiplatelets and to 3 weeks, after that Plavix.  PT OT.  Echo and carotid duplex. Detailed plan as above.     SKarena AddisonAroor MD [facility] [phone]  If 7pm to 7am,  please call on call as listed on AMION.

## 2018-03-24 NOTE — Code Documentation (Signed)
77 yo Female coming from home with complaints of left side facial droop, left sided weakness that started this morning. Pt went to bed last night at 2030 normal, but woke up at 0230 to go to the bathroom and noticed abnormal gait. Pt went back to bed and when she got up this morning, she noticed a left facial droop and left sided weakness. Called EMS and EMS activated a LVO + Code Stroke. BP 140/84, CBG 217, 100 HR. Stroke Team met patient upon arrival to the ED. Initial NIHSS 3 due to left facial droop, left arm drift, and dysarthria. Pt taken to CT. CT Head negative. NO tPA due to being outside of window. NO IR due to not being consistent with LVO. Handoff given to Santiago Glad, Therapist, sports.

## 2018-03-24 NOTE — Progress Notes (Signed)
SLP Cancellation Note  Patient Details Name: Mikaela Hilgeman MRN: [id number] DOB: 01-02-1941   Cancelled treatment:       Reason Eval/Treat Not Completed: Patient at procedure or test/unavailable, pt currently off the floor, will attempt for cognitive-linguistic evaluation as pt is available.     Auriah Hollings, Selinda Orion 03/24/2018, 2:39 PM

## 2018-03-24 NOTE — Evaluation (Signed)
Physical Therapy Evaluation Patient Details Name: [name] MRN: [id number] DOB: July 22, 1941 Today's Date: 03/24/2018   History of Present Illness  77 year old female with a history of tachycardia status post ablation in 2007, hypertension, dyslipidemia, type 2 diabetes insulin-dependent, chronic kidney disease stage III, history of breast cancer, history of colon cancer, who presents to the ED this morning with left-sided weakness, left facial droopNIHSS 3 due to left facial droop, left arm drift, and dysarthria. 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   Clinical Impression  Pt admitted with above diagnosis. Pt currently with functional limitations due to the deficits listed below (see PT Problem List).Prior to admission, patient living alone and independent with ADL's and mobility. Currently, patient presenting with decreased functional mobility secondary to poor balance, decreased coordination, and decreased right sided strength. On PT evaluation, patient ambulating 10 feet with two person moderate assistance. Highly recommending CIR to maximize functional independence. Suspect patient will progress well based on PLOF, motivation and family support. Pt will benefit from skilled PT to increase their independence and safety with mobility to allow discharge to the venue listed below.       Follow Up Recommendations CIR    Equipment Recommendations  Other (comment)(defer)    Recommendations for Other Services       Precautions / Restrictions Precautions Precautions: Fall Restrictions Weight Bearing Restrictions: No      Mobility  Bed Mobility Overal bed mobility: Needs Assistance Bed Mobility: Supine to Sit;Sit to Supine     Supine to sit: Mod assist Sit to supine: Mod assist   General bed mobility comments: Assist to elevate trunk from sidelying and subsequently assist to bring BLE's onto bed once returned  Transfers Overall transfer level: Needs  assistance   Transfers: Sit to/from Stand;Stand Pivot Transfers Sit to Stand: Mod assist Stand pivot transfers: Mod assist(appaent coordination deficits)       General transfer comment: posterior lean able to correct with vc   Ambulation/Gait Ambulation/Gait assistance: Mod assist;+2 physical assistance Gait Distance (Feet): 10 Feet Assistive device: 2 person hand held assist Gait Pattern/deviations: Step-through pattern;Decreased stride length;Decreased dorsiflexion - right;Decreased dorsiflexion - left;Shuffle;Narrow base of support;Leaning posteriorly Gait velocity: decreased Gait velocity interpretation: <1.31 ft/sec, indicative of household ambulator General Gait Details: Patient with posterior bias and difficulty with anterior weight shift. Cues for weight shifting and movement initiation  Stairs            Wheelchair Mobility    Modified Rankin (Stroke Patients Only) Modified Rankin (Stroke Patients Only) Pre-Morbid Rankin Score: No significant disability Modified Rankin: Moderately severe disability     Balance Overall balance assessment: Needs assistance   Sitting balance-Leahy Scale: Fair Sitting balance - Comments: L bias initially, and posterior lean with no foot support during muscle testing     Standing balance-Leahy Scale: Poor Standing balance comment: reliant on external support                             Pertinent Vitals/Pain Pain Assessment: No/denies pain    Home Living Family/patient expects to be discharged to:: Private residence Living Arrangements: Alone Available Help at Discharge: Family Type of Home: Other(Comment) Home Access: (town home)     Home Layout: One level Home Equipment: None      Prior Function Level of Independence: Independent         Comments: drove; medication management; 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 able to touch hand to mouth; able to raise RUE to @ 60 FF due to proximal weakness; full gross grasp/release with extensor lag; poor in-hand manipulation skills; attempting to use functionally; @ Brunstrom stage V LUE Coordination: decreased fine motor;decreased gross motor    Lower Extremity Assessment Lower Extremity Assessment: RLE deficits/detail;LLE deficits/detail RLE Deficits / Details: 5/5 LLE Deficits / Details: grossly 5/5 except hip flexion 4/5 LLE Coordination: WNL    Cervical / Trunk Assessment Cervical / Trunk Assessment: Other exceptions Cervical / Trunk Exceptions: R bias  Communication   Communication: Expressive difficulties  Cognition Arousal/Alertness: Awake/alert Behavior During Therapy: Anxious Overall Cognitive Status: Impaired/Different from baseline Area of Impairment: Attention;Memory;Awareness                   Current Attention Level: Selective Memory: Decreased short-term memory     Awareness: Emergent          General Comments      Exercises Other Exercises Other Exercises: BUE integrated activities - folding towels Other Exercises: screwing top/unscrewing top on lotion bottle   Assessment/Plan    PT Assessment Patient needs continued PT services  PT Problem List Decreased strength;Decreased activity tolerance;Decreased balance;Decreased mobility;Decreased coordination       PT Treatment Interventions DME instruction;Gait training;Functional mobility training;Therapeutic activities;Therapeutic exercise;Balance training;Patient/family education    PT Goals (Current goals can be found in the Care Plan section)  Acute Rehab PT Goals Patient Stated Goal: to return to being independent PT Goal Formulation: With patient/family Time For Goal Achievement: 04/07/18 Potential to Achieve Goals: Good     Frequency Min 4X/week   Barriers to discharge        Co-evaluation               AM-PAC PT "6 Clicks" Daily Activity  Outcome Measure Difficulty turning over in bed (including adjusting bedclothes, sheets and blankets)?: A Little Difficulty moving from lying on back to sitting on the side of the bed? : Unable Difficulty sitting down on and standing up from a chair with arms (e.g., wheelchair, bedside commode, etc,.)?: Unable Help needed moving to and from a bed to chair (including a wheelchair)?: A Lot Help needed walking in hospital room?: A Lot Help needed climbing 3-5 steps with a railing? : Total 6 Click Score: 10    End of Session Equipment Utilized During Treatment: Gait belt Activity Tolerance: Patient tolerated treatment well Patient left: in bed;with call bell/phone within reach;with bed alarm set;with nursing/sitter in room;with family/visitor present Nurse Communication: Mobility status PT Visit Diagnosis: Unsteadiness on feet (R26.81);Other abnormalities of gait and mobility (R26.89);Difficulty in walking, not elsewhere classified (R26.2);Other symptoms and signs involving the nervous system (R29.898)    Time: 4854-6270 PT Time Calculation (min) (ACUTE ONLY): 23 min   Charges:   PT Evaluation $PT Eval Moderate Complexity: 1 Mod PT Treatments $Gait Training: 8-22 mins       Ellamae Sia, PT, DPT Acute Rehabilitation Services  Pager: [phone]   Willy Eddy 03/24/2018, 5:24 PM

## 2018-03-24 NOTE — ED Provider Notes (Signed)
[facility] EMERGENCY DEPARTMENT Provider Note   CSN: [id number] Arrival date & time: 03/24/18  0759     History   Chief Complaint Chief Complaint  Patient presents with  . Code Stroke    HPI [name] is a 77 y.o. female.  HPI Patient reports that she awakened this morning at 730.  She noticed she had weakness on the left side of her arm and her leg.  She went into the bathroom and saw that her left mouth was drooping and called her son.  Patient arrives as code stroke.  On arrival she is alert and answering questions.  No respiratory distress.  Airway patent.  Stable for direct transport to imaging.  Patient went to bed well yesterday evening.  She has not recently been ill with fever, chills, vomiting, diarrhea.  She has not had any pain.  She has never had similar symptoms. Past Medical History:  Diagnosis Date  . Anxiety   . Back ache   . Breast cancer ([place])    right  . Bronchitis   . Cancer of colon ([place])   . Depression   . DM (diabetes mellitus) ([place])   . Dyspnea on exertion   . Fall on steps    age 78 feel down iron stairs couldnot move leg for 6 mths  . Family history of primary TB   . Fracture of ankle, closed 2014   left  . History of chicken pox   . History of primary TB   . Hyperlipidemia   . Hypertension   . Kidney disease, chronic, stage III (moderate, EGFR 30-59 ml/min) ([place]) 2017  . Kidney disorder    reduction function  . Kidney stones   . Knee pain, right   . Lung abnormality    age 61 patient had pna spot on lungs   . Microalbuminuria   . Muscle cramps    both legs  . Muscular degeneration    of right eye  . Nephrolithiasis   . Palpitations   . Personal history of radiation therapy     Patient Active Problem List   Diagnosis Date Noted  . TIA (transient ischemic attack) 03/24/2018  . Dyspnea on exertion 05/30/2017  . Sinus tachycardia 05/30/2017  . Mixed hyperlipidemia 05/30/2017  . Essential hypertension  05/30/2017    Past Surgical History:  Procedure Laterality Date  . ABLATION  2007   fast heart rate  . ANKLE FRACTURE SURGERY Left 2014  . BREAST LUMPECTOMY Right   . CATARACT EXTRACTION Right 2017  . colonscopy  2015  . HYSTEROTOMY     45 years ago  . TONSILLECTOMY AND ADENOIDECTOMY     age 32     OB History   None      Home Medications    Prior to Admission medications   Medication Sig Start Date End Date Taking? Authorizing Provider  amLODipine (NORVASC) 5 MG tablet Take 5 mg by mouth daily.   Yes [provider]  ARIPiprazole (ABILIFY) 5 MG tablet Take 5 mg daily by mouth. 03/30/17  Yes [provider]  aspirin EC 81 MG tablet Take 81 mg by mouth daily.   Yes [provider]  calcium carbonate (OSCAL) 1500 (600 Ca) MG TABS tablet Take 600 mg of elemental calcium by mouth 2 (two) times daily with a meal.   Yes [provider]  HUMALOG MIX 75/25 KWIKPEN (75-25) 100 UNIT/ML Kwikpen Take 38-44 Units by mouth See admin instructions. Take  44 units in the morning and 38 units in the evening 03/19/18  Yes [provider]  Icosapent Ethyl (VASCEPA) 1 g CAPS Take 1 g by mouth 2 (two) times daily.   Yes [provider]  linagliptin (TRADJENTA) 5 MG TABS tablet Take 5 mg by mouth daily.   Yes [provider]  lisinopril (PRINIVIL,ZESTRIL) 10 MG tablet Take 10 mg by mouth daily.   Yes [provider]  LORazepam (ATIVAN) 0.5 MG tablet Take 0.5 mg by mouth 4 (four) times daily.   Yes [provider]  metFORMIN (GLUCOPHAGE) 500 MG tablet Take 500 mg by mouth 2 (two) times daily with a meal.   Yes [provider]  mirtazapine (REMERON) 15 MG tablet Take 15 mg by mouth at bedtime.   Yes [provider]  multivitamin-lutein (OCUVITE-LUTEIN) CAPS capsule Take 2 capsules by mouth daily.   Yes [provider]  omega-3 acid ethyl esters (LOVAZA) 1 g capsule Take 1 g by mouth 2 (two) times  daily.   Yes [provider]  pravastatin (PRAVACHOL) 20 MG tablet Take 20 mg by mouth at bedtime.    Yes [provider]  venlafaxine XR (EFFEXOR-XR) 75 MG 24 hr capsule Take 75 mg by mouth every other day.   Yes [provider]    Family History Family History  Problem Relation Age of Onset  . Peripheral Artery Disease Mother 32       HX OF POOR CIRCULATION, SMOKING, LEG AMPUTATION  . Cancer Father   . Asthma Brother 87  . Kidney Stones Brother   . Breast cancer Neg Hx     Social History Social History   Tobacco Use  . Smoking status: Never Smoker  . Smokeless tobacco: Never Used  Substance Use Topics  . Alcohol use: No  . Drug use: No     Allergies   Patient has no known allergies.   Review of Systems Review of Systems 10 Systems reviewed and are negative for acute change except as noted in the HPI.  Physical Exam Updated Vital Signs BP (!) 148/81 (BP Location: Left Arm)   Pulse 80   Temp 97.8 F (36.6 C) (Oral)   Resp 18   Ht _0  (1.702 m)   Wt 90.6 kg   SpO2 100%   BMI 31.28 kg/m   Physical Exam  Constitutional: She appears well-developed and well-nourished. No distress.  HENT:  Head: Normocephalic and atraumatic.  Mouth/Throat: Oropharynx is clear and moist.  Eyes: EOM are normal.  Cardiovascular: Regular rhythm and intact distal pulses.  Pulmonary/Chest: Effort normal and breath sounds normal. No respiratory distress.  Abdominal: Soft. She exhibits no distension. There is no tenderness. There is no guarding.  Musculoskeletal: She exhibits no edema or deformity.  Neurological: She is alert.  Patient is alert.  She is oriented.  He is assisting and following commands.  This time labs being drawn on the left.  Positive grip on the right.  Left mouth droop with smile.  Skin: Skin is warm and dry.  Psychiatric: She has a normal mood and affect.   Repeat exam after CT 08: 44 Patient is alert and oriented x3. Patient  continues to have mild left mouth droop.  Speech is very mildly slurred.  Completely intelligible.  Content normal. No pronator drift.  Very subtle grip differential.  Right 5\5, left 4\5.  Patient does not identify sensory differential left to right to light touch. Lower extremity strength differential 5\5  on the right, 4\5 on the left.  Does not identify sensory differential to left to right.   ED Treatments / Results  Labs (all labs ordered are listed, but only abnormal results are displayed) Labs Reviewed  CBC - Abnormal; Notable for the following components:      Result Value   RBC 3.39 (*)    Hemoglobin 10.9 (*)    HCT 32.6 (*)    All other components within normal limits  COMPREHENSIVE METABOLIC PANEL - Abnormal; Notable for the following components:   Glucose, Bld 232 (*)    BUN 30 (*)    Creatinine, Ser 1.95 (*)    Total Protein 6.1 (*)    Albumin 3.4 (*)    AST 43 (*)    GFR calc non Af Amer 24 (*)    GFR calc Af Amer 27 (*)    All other components within normal limits  HEMOGLOBIN A1C - Abnormal; Notable for the following components:   Hgb A1c MFr Bld 8.2 (*)    All other components within normal limits  GLUCOSE, CAPILLARY - Abnormal; Notable for the following components:   Glucose-Capillary 162 (*)    All other components within normal limits  I-STAT CHEM 8, ED - Abnormal; Notable for the following components:   BUN 32 (*)    Creatinine, Ser 2.00 (*)    Glucose, Bld 227 (*)    Hemoglobin 10.9 (*)    HCT 32.0 (*)    All other components within normal limits  CBG MONITORING, ED - Abnormal; Notable for the following components:   Glucose-Capillary 214 (*)    All other components within normal limits  ETHANOL  PROTIME-INR  APTT  DIFFERENTIAL  TROPONIN I  RAPID URINE DRUG SCREEN, HOSP PERFORMED  URINALYSIS, ROUTINE W REFLEX MICROSCOPIC  URINALYSIS, ROUTINE W REFLEX MICROSCOPIC  TROPONIN I  TROPONIN I  RAPID URINE DRUG SCREEN, HOSP PERFORMED  I-STAT TROPONIN,  ED    EKG EKG Interpretation  Date/Time:  Friday March 24 2018 08:29:18 EDT Ventricular Rate:  87 PR Interval:    QRS Duration: 91 QT Interval:  368 QTC Calculation: 443 R Axis:   -28 Text Interpretation:  Sinus rhythm Borderline left axis deviation Borderline low voltage, extremity leads no STEMI, no acute ischemic appearance. no old comparison Confirmed by Charlesetta Shanks [phone]) on 03/24/2018 8:32:12 AM Also confirmed by Charlesetta Shanks [phone]), editor Hattie Perch [phone])  on 03/24/2018 11:18:05 AM   Radiology Mr Brain Wo Contrast  Result Date: 03/24/2018 CLINICAL DATA:  Acute onset of left-sided weakness and facial droop. Last known well yesterday evening. EXAM: MRI HEAD WITHOUT CONTRAST MRA HEAD WITHOUT CONTRAST TECHNIQUE: Multiplanar, multiecho pulse sequences of the brain and surrounding structures were obtained without intravenous contrast. Angiographic images of the head were obtained using MRA technique without contrast. COMPARISON:  None. FINDINGS: MRI HEAD FINDINGS Brain: The diffusion-weighted images demonstrate an acute nonhemorrhagic 12 mm linear right paramedian pontine infarct. No other acute infarct is present. Subtle T2 signal changes are associated with the acute/subacute infarct. Scattered subcortical T2 hyperintensities bilaterally are mildly advanced for age. Ventricles are of normal size. No other cortical lesions are present. No significant extra-axial fluid collection is present. The cerebellum is within normal limits. The internal auditory canals are normal. Vascular: Flow is present in the major intracranial arteries. The left vertebral artery is dominant. Skull and upper cervical spine: The skull base is within normal limits. Craniocervical junction is within normal limits. Is present at C4-5. Marrow signal  is normal. Sinuses/Orbits: The paranasal sinuses and mastoid air cells are clear. Bilateral lens replacements are present. Globes and orbits are within  normal limits. MRA HEAD FINDINGS Internal carotid arteries demonstrate normal flow signal the high cervical segments through the ICA termini bilaterally. The A1 and M1 segments are normal. The anterior communicating artery is patent. MCA bifurcations are within normal limits. ACA and MCA branch vessels are normal. The left vertebral artery is the dominant vessel. The right vertebral artery is hypoplastic with segmental narrowing through the V4 segment. The right vertebral artery essentially terminates at the PICA. The left PICA origin is visualized and normal. The basilar artery is normal. The right posterior cerebral artery originates from basilar tip. The left posterior cerebral artery is of fetal type. There is some attenuation of distal PCA branch vessels bilaterally IMPRESSION: 1. 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 2. Scattered subcortical T2 hyperintensities bilaterally are mildly advanced for age. This likely reflects the sequela of chronic microvascular ischemia. 3. MRA circle-of-Willis demonstrates mild distal small vessel disease without significant proximal stenosis, aneurysm, or branch vessel occlusion. 4. Degenerative changes of the cervical spine at C4-5. These results were called by telephone at the time of interpretation on 03/24/2018 at 11:37 am to Dr. Charlesetta Shanks , who verbally acknowledged these results. Electronically Signed   By: San Morelle M.D.   On: 03/24/2018 11:41   Mr Jodene Nam Head (cerebral Arteries)  Result Date: 03/24/2018 CLINICAL DATA:  Acute onset of left-sided weakness and facial droop. Last known well yesterday evening. EXAM: MRI HEAD WITHOUT CONTRAST MRA HEAD WITHOUT CONTRAST TECHNIQUE: Multiplanar, multiecho pulse sequences of the brain and surrounding structures were obtained without intravenous contrast. Angiographic images of the head were obtained using MRA technique without contrast. COMPARISON:  None. FINDINGS: MRI HEAD FINDINGS Brain:  The diffusion-weighted images demonstrate an acute nonhemorrhagic 12 mm linear right paramedian pontine infarct. No other acute infarct is present. Subtle T2 signal changes are associated with the acute/subacute infarct. Scattered subcortical T2 hyperintensities bilaterally are mildly advanced for age. Ventricles are of normal size. No other cortical lesions are present. No significant extra-axial fluid collection is present. The cerebellum is within normal limits. The internal auditory canals are normal. Vascular: Flow is present in the major intracranial arteries. The left vertebral artery is dominant. Skull and upper cervical spine: The skull base is within normal limits. Craniocervical junction is within normal limits. Is present at C4-5. Marrow signal is normal. Sinuses/Orbits: The paranasal sinuses and mastoid air cells are clear. Bilateral lens replacements are present. Globes and orbits are within normal limits. MRA HEAD FINDINGS Internal carotid arteries demonstrate normal flow signal the high cervical segments through the ICA termini bilaterally. The A1 and M1 segments are normal. The anterior communicating artery is patent. MCA bifurcations are within normal limits. ACA and MCA branch vessels are normal. The left vertebral artery is the dominant vessel. The right vertebral artery is hypoplastic with segmental narrowing through the V4 segment. The right vertebral artery essentially terminates at the PICA. The left PICA origin is visualized and normal. The basilar artery is normal. The right posterior cerebral artery originates from basilar tip. The left posterior cerebral artery is of fetal type. There is some attenuation of distal PCA branch vessels bilaterally IMPRESSION: 1. 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 2. Scattered subcortical T2 hyperintensities bilaterally are mildly advanced for age. This likely reflects the sequela of chronic microvascular ischemia. 3. MRA  circle-of-Willis demonstrates mild distal small  vessel disease without significant proximal stenosis, aneurysm, or branch vessel occlusion. 4. Degenerative changes of the cervical spine at C4-5. These results were called by telephone at the time of interpretation on 03/24/2018 at 11:37 am to Dr. Charlesetta Shanks , who verbally acknowledged these results. Electronically Signed   By: San Morelle M.D.   On: 03/24/2018 11:41   Ct Head Code Stroke Wo Contrast  Result Date: 03/24/2018 CLINICAL DATA:  Code stroke.  Left-sided weakness. EXAM: CT HEAD WITHOUT CONTRAST TECHNIQUE: Contiguous axial images were obtained from the base of the skull through the vertex without intravenous contrast. COMPARISON:  None. FINDINGS: Brain: There is no evidence of acute infarct, intracranial hemorrhage, mass, midline shift, or extra-axial fluid collection. The ventricles and sulci are within normal limits for age. Scattered cerebral white matter hypodensities are nonspecific but compatible with mild chronic small vessel ischemic disease. Vascular: Calcified atherosclerosis at the skull base. No hyperdense vessel. Skull: No fracture or focal osseous lesion. Sinuses/Orbits: Visualized paranasal sinuses and mastoid air cells are clear. Bilateral cataract extraction is noted. Other: None. ASPECTS [facility] Stroke Program Early CT Score) - Ganglionic level infarction (caudate, lentiform nuclei, internal capsule, insula, M1-M3 cortex): 7 - Supraganglionic infarction (M4-M6 cortex): 3 Total score (0-10 with 10 being normal): 10 IMPRESSION: 1. No evidence of acute intracranial abnormality. 2. ASPECTS is 10. 3. Mild chronic small vessel ischemic disease. These results were communicated to Dr. Lorraine Lax at 8:19 am on 03/24/2018 by text page via the [facility] messaging system. Electronically Signed   By: Logan Bores M.D.   On: 03/24/2018 08:20    Procedures Procedures (including critical care time) CRITICAL CARE Performed by: Charlesetta Shanks   Total critical care time: 30 minutes  Critical care time was exclusive of separately billable procedures and treating other patients.  Critical care was necessary to treat or prevent imminent or life-threatening deterioration.  Critical care was time spent personally by me on the following activities: development of treatment plan with patient and/or surrogate as well as nursing, discussions with consultants, evaluation of patient's response to treatment, examination of patient, obtaining history from patient or surrogate, ordering and performing treatments and interventions, ordering and review of laboratory studies, ordering and review of radiographic studies, pulse oximetry and re-evaluation of patient's condition. Medications Ordered in ED Medications  aspirin EC tablet 325 mg (has no administration in time range)  clopidogrel (PLAVIX) tablet 300 mg (has no administration in time range)  0.9 %  sodium chloride infusion ( Intravenous Not Given 03/24/18 1231)  atorvastatin (LIPITOR) tablet 40 mg (has no administration in time range)  ARIPiprazole (ABILIFY) tablet 5 mg (has no administration in time range)  LORazepam (ATIVAN) tablet 0.5 mg (0.5 mg Oral Given 03/24/18 1238)  mirtazapine (REMERON) tablet 15 mg (has no administration in time range)  venlafaxine XR (EFFEXOR-XR) 24 hr capsule 75 mg (has no administration in time range)   stroke: mapping our early stages of recovery book (has no administration in time range)  0.9 %  sodium chloride infusion ( Intravenous New Bag/Given 03/24/18 1249)  acetaminophen (TYLENOL) tablet 650 mg (has no administration in time range)    Or  acetaminophen (TYLENOL) solution 650 mg (has no administration in time range)    Or  acetaminophen (TYLENOL) suppository 650 mg (has no administration in time range)  senna-docusate (Senokot-S) tablet 1 tablet (has no administration in time range)  enoxaparin (LOVENOX) injection 40 mg (has no administration  in time range)  insulin aspart (novoLOG) injection 0-9 Units (  has no administration in time range)  insulin aspart protamine- aspart (NOVOLOG MIX 70/30) injection 10 Units (has no administration in time range)  aspirin tablet 325 mg (has no administration in time range)  clopidogrel (PLAVIX) tablet 75 mg (has no administration in time range)     Initial Impression / Assessment and Plan / ED Course  I have reviewed the triage vital signs and the nursing notes.  Pertinent labs & imaging results that were available during my care of the patient were reviewed by me and considered in my medical decision making (see chart for details).    Consult: Reviewed with Dr. Malen Gauze who is seen the patient as code stroke.  Patient is out of window for thrombolytics.  The patient does not have LVO criteria.  Administer aspirin and Plavix 300 mg load.  Proceed with MRI MRA and admission.  Final Clinical Impressions(s) / ED Diagnoses   Final diagnoses:  Acute ischemic stroke [facility])   Patient presents as outlined above.  He does have objective left facial droop and mild left-sided motor weakness.  Patient's airway and mental status are stable.  Vital signs are stable.  Aspirin and Plavix administered as per consultation with Dr. Malen Gauze.  Will be admitted for ongoing diagnostic evaluation of stroke including MRI\MRA. ED Discharge Orders    None       Charlesetta Shanks, MD 03/24/18 1550

## 2018-03-25 DIAGNOSIS — E1151 Type 2 diabetes mellitus with diabetic peripheral angiopathy without gangrene: Secondary | ICD-10-CM | POA: Diagnosis present

## 2018-03-25 DIAGNOSIS — E1122 Type 2 diabetes mellitus with diabetic chronic kidney disease: Secondary | ICD-10-CM | POA: Diagnosis present

## 2018-03-25 DIAGNOSIS — F419 Anxiety disorder, unspecified: Secondary | ICD-10-CM | POA: Diagnosis present

## 2018-03-25 DIAGNOSIS — E785 Hyperlipidemia, unspecified: Secondary | ICD-10-CM | POA: Diagnosis present

## 2018-03-25 DIAGNOSIS — R29810 Facial weakness: Secondary | ICD-10-CM | POA: Diagnosis present

## 2018-03-25 DIAGNOSIS — I5032 Chronic diastolic (congestive) heart failure: Secondary | ICD-10-CM | POA: Diagnosis present

## 2018-03-25 DIAGNOSIS — I6350 Cerebral infarction due to unspecified occlusion or stenosis of unspecified cerebral artery: Secondary | ICD-10-CM

## 2018-03-25 DIAGNOSIS — I639 Cerebral infarction, unspecified: Secondary | ICD-10-CM | POA: Diagnosis present

## 2018-03-25 DIAGNOSIS — N183 Chronic kidney disease, stage 3 (moderate): Secondary | ICD-10-CM | POA: Diagnosis not present

## 2018-03-25 DIAGNOSIS — E669 Obesity, unspecified: Secondary | ICD-10-CM | POA: Diagnosis present

## 2018-03-25 DIAGNOSIS — Z87442 Personal history of urinary calculi: Secondary | ICD-10-CM | POA: Diagnosis not present

## 2018-03-25 DIAGNOSIS — G459 Transient cerebral ischemic attack, unspecified: Secondary | ICD-10-CM | POA: Diagnosis not present

## 2018-03-25 DIAGNOSIS — E119 Type 2 diabetes mellitus without complications: Secondary | ICD-10-CM | POA: Diagnosis not present

## 2018-03-25 DIAGNOSIS — Z79899 Other long term (current) drug therapy: Secondary | ICD-10-CM | POA: Diagnosis not present

## 2018-03-25 DIAGNOSIS — Z7982 Long term (current) use of aspirin: Secondary | ICD-10-CM | POA: Diagnosis not present

## 2018-03-25 DIAGNOSIS — F329 Major depressive disorder, single episode, unspecified: Secondary | ICD-10-CM | POA: Diagnosis present

## 2018-03-25 DIAGNOSIS — Z794 Long term (current) use of insulin: Secondary | ICD-10-CM | POA: Diagnosis not present

## 2018-03-25 DIAGNOSIS — Z85038 Personal history of other malignant neoplasm of large intestine: Secondary | ICD-10-CM | POA: Diagnosis not present

## 2018-03-25 DIAGNOSIS — Z923 Personal history of irradiation: Secondary | ICD-10-CM | POA: Diagnosis not present

## 2018-03-25 DIAGNOSIS — F411 Generalized anxiety disorder: Secondary | ICD-10-CM | POA: Diagnosis not present

## 2018-03-25 DIAGNOSIS — Z9842 Cataract extraction status, left eye: Secondary | ICD-10-CM | POA: Diagnosis not present

## 2018-03-25 DIAGNOSIS — N184 Chronic kidney disease, stage 4 (severe): Secondary | ICD-10-CM | POA: Diagnosis present

## 2018-03-25 DIAGNOSIS — Z9841 Cataract extraction status, right eye: Secondary | ICD-10-CM | POA: Diagnosis not present

## 2018-03-25 DIAGNOSIS — Z6831 Body mass index (BMI) 31.0-31.9, adult: Secondary | ICD-10-CM | POA: Diagnosis not present

## 2018-03-25 DIAGNOSIS — I130 Hypertensive heart and chronic kidney disease with heart failure and stage 1 through stage 4 chronic kidney disease, or unspecified chronic kidney disease: Secondary | ICD-10-CM | POA: Diagnosis present

## 2018-03-25 DIAGNOSIS — G8194 Hemiplegia, unspecified affecting left nondominant side: Secondary | ICD-10-CM | POA: Diagnosis present

## 2018-03-25 DIAGNOSIS — R29703 NIHSS score 3: Secondary | ICD-10-CM | POA: Diagnosis present

## 2018-03-25 DIAGNOSIS — R471 Dysarthria and anarthria: Secondary | ICD-10-CM | POA: Diagnosis present

## 2018-03-25 LAB — CBC
HCT: 35.6 % — ABNORMAL LOW (ref 36.0–46.0)
Hemoglobin: 11.8 g/dL — ABNORMAL LOW (ref 12.0–15.0)
MCH: 32.2 pg (ref 26.0–34.0)
MCHC: 33.1 g/dL (ref 30.0–36.0)
MCV: 97 fL (ref 78.0–100.0)
Platelets: 191 10*3/uL (ref 150–400)
RBC: 3.67 MIL/uL — ABNORMAL LOW (ref 3.87–5.11)
RDW: 13.1 % (ref 11.5–15.5)
WBC: 5.8 10*3/uL (ref 4.0–10.5)

## 2018-03-25 LAB — GLUCOSE, CAPILLARY
Glucose-Capillary: 161 mg/dL — ABNORMAL HIGH (ref 70–99)
Glucose-Capillary: 170 mg/dL — ABNORMAL HIGH (ref 70–99)
Glucose-Capillary: 191 mg/dL — ABNORMAL HIGH (ref 70–99)
Glucose-Capillary: 192 mg/dL — ABNORMAL HIGH (ref 70–99)
Glucose-Capillary: 213 mg/dL — ABNORMAL HIGH (ref 70–99)

## 2018-03-25 LAB — LIPID PANEL
Cholesterol: 189 mg/dL (ref 0–200)
HDL: 25 mg/dL — ABNORMAL LOW (ref >40)
LDL Cholesterol: UNDETERMINED mg/dL (ref 0–99)
Total CHOL/HDL Ratio: 7.6 RATIO
Triglycerides: 578 mg/dL — ABNORMAL HIGH (ref <150)
VLDL: UNDETERMINED mg/dL (ref 0–40)

## 2018-03-25 LAB — HEMOGLOBIN A1C
Hgb A1c MFr Bld: 8.6 % — ABNORMAL HIGH (ref 4.8–5.6)
Mean Plasma Glucose: 200.12 mg/dL

## 2018-03-25 LAB — COMPREHENSIVE METABOLIC PANEL
ALT: 39 U/L (ref 0–44)
AST: 36 U/L (ref 15–41)
Albumin: 3.1 g/dL — ABNORMAL LOW (ref 3.5–5.0)
Alkaline Phosphatase: 72 U/L (ref 38–126)
Anion gap: 10 (ref 5–15)
BUN: 31 mg/dL — ABNORMAL HIGH (ref 8–23)
CO2: 23 mmol/L (ref 22–32)
Calcium: 9.8 mg/dL (ref 8.9–10.3)
Chloride: 107 mmol/L (ref 98–111)
Creatinine, Ser: 1.7 mg/dL — ABNORMAL HIGH (ref 0.44–1.00)
GFR calc Af Amer: 32 mL/min — ABNORMAL LOW (ref >60)
GFR calc non Af Amer: 28 mL/min — ABNORMAL LOW (ref >60)
Glucose, Bld: 137 mg/dL — ABNORMAL HIGH (ref 70–99)
Potassium: 4.7 mmol/L (ref 3.5–5.1)
Sodium: 140 mmol/L (ref 135–145)
Total Bilirubin: 0.8 mg/dL (ref 0.3–1.2)
Total Protein: 5.9 g/dL — ABNORMAL LOW (ref 6.5–8.1)

## 2018-03-25 LAB — TROPONIN I: Troponin I: <0.03 ng/mL

## 2018-03-25 MED ORDER — INSULIN ASPART 100 UNIT/ML ~~LOC~~ SOLN
0.0000 [IU] | Freq: Three times a day (TID) | SUBCUTANEOUS | Status: DC
  Administered 2018-03-25 (×3): 2 [IU] via SUBCUTANEOUS
  Administered 2018-03-26 (×2): 3 [IU] via SUBCUTANEOUS
  Administered 2018-03-26 – 2018-03-28 (×6): 2 [IU] via SUBCUTANEOUS

## 2018-03-25 MED ORDER — INSULIN ASPART 100 UNIT/ML ~~LOC~~ SOLN
4.0000 [IU] | Freq: Once | SUBCUTANEOUS | Status: AC
  Administered 2018-03-26: 4 [IU] via SUBCUTANEOUS

## 2018-03-25 MED ORDER — ATORVASTATIN CALCIUM 80 MG PO TABS
80.0000 mg | ORAL_TABLET | Freq: Every day | ORAL | Status: DC
  Administered 2018-03-26 – 2018-03-27 (×2): 80 mg via ORAL
  Filled 2018-03-25 (×2): qty 1

## 2018-03-25 NOTE — Progress Notes (Signed)
STROKE TEAM PROGRESS NOTE   HISTORY OF PRESENT ILLNESS (per record) [name] is an 77 y.o. female PMH of HTN, HLD, DM, and right breast cancer who presented to [facility] ED as a code stroke for left side weakness and left facial droop.   Patient states that she was in her usual state of health last night before bed 2030 and when she talked to her son at 64. She woke up at 0200 and noticed some unsteady gait. She returned to bed and at 0700 this morning she noticed the slurred speech, left facial droop and weakness. Denies any HA, SOB, CP, smoking, ETOH, vision changes. Patient wears glasses for " small words" at baseline. She does live alone and does not use a cane or walker to ambulate. Denies a fib or any prior stroke history.  ED course:  CT Head: no acute intracranial abnormality NIHSS 3,CTA not done as not clinically LVO and patient has impaired renal function. Stroke alert cancelled as not a tPA candidate and negative for LVO.  BP: 140/84 per EMS BG: 217 per EMS, Creatinine: 2.00  Date last known well: Date: 03/23/2018 Time last known well: Time: 20:30 tPA Given: No: contraindicated, outside of window Modified Rankin: Rankin Score=1  NIHSS:3   SUBJECTIVE (INTERVAL HISTORY) No family members present.  The patient was alert and oriented.  She asked Dr. Leonie Man to update her son, who is an orthopedic surgeon in [place], regarding her condition. Dr. Leonie Man spoke by phone to the patient's son and answered questions regarding the patient's condition and prognosis.    OBJECTIVE Vitals:   03/25/18 0436 03/25/18 0740 03/25/18 1121 03/25/18 1549  BP:  126/62 (!) 161/90 140/77  Pulse: 73  77 89  Resp: 18 16  14   Temp:  98.5 F (36.9 C) 98.4 F (36.9 C)   TempSrc:  Oral Oral   SpO2: 97%  99% 95%  Weight:      Height:        CBC:  Recent Labs  Lab 03/24/18 0801 03/24/18 0814 03/25/18 0247  WBC 5.5  --  5.8  NEUTROABS 3.6  --   --   HGB 10.9* 10.9* 11.8*  HCT 32.6* 32.0*  35.6*  MCV 96.2  --  97.0  PLT 197  --  295    Basic Metabolic Panel:  Recent Labs  Lab 03/24/18 0801 03/24/18 0814 03/25/18 0247  NA 137 137 140  K 4.8 4.8 4.7  CL 105 104 107  CO2 24  --  23  GLUCOSE 232* 227* 137*  BUN 30* 32* 31*  CREATININE 1.95* 2.00* 1.70*  CALCIUM 9.9  --  9.8    Lipid Panel:     Component Value Date/Time   CHOL 189 03/25/2018 0247   TRIG 578 (H) 03/25/2018 0247   HDL 25 (L) 03/25/2018 0247   CHOLHDL 7.6 03/25/2018 0247   VLDL UNABLE TO CALCULATE IF TRIGLYCERIDE OVER 400 mg/dL 03/25/2018 0247   LDLCALC UNABLE TO CALCULATE IF TRIGLYCERIDE OVER 400 mg/dL 03/25/2018 0247   HgbA1c:  Lab Results  Component Value Date   HGBA1C 8.6 (H) 03/25/2018   Urine Drug Screen: No results found for: LABOPIA, COCAINSCRNUR, LABBENZ, AMPHETMU, THCU, LABBARB  Alcohol Level     Component Value Date/Time   ETH <10 03/24/2018 0801    IMAGING  Mr Brain Wo Contrast 03/24/2018 IMPRESSION:  1. 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  2. Scattered subcortical T2 hyperintensities bilaterally are mildly advanced for age.This likely  reflects the sequela of chronic microvascular ischemia.  3. MRA circle-of-Willis demonstrates mild distal small vessel disease without significant proximal stenosis, aneurysm, or branch vessel occlusion.  4. Degenerative changes of the cervical spine at C4-5.    Mr Virgel Paling (cerebral Arteries) 03/24/2018 IMPRESSION:  1. 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  2. Scattered subcortical T2 hyperintensities bilaterally are mildly advanced for age. This likely reflects the sequela of chronic microvascular ischemia.  3. MRA circle-of-Willis demonstrates mild distal small vessel disease without significant proximal stenosis, aneurysm, or branch vessel occlusion.  4. Degenerative changes of the cervical spine at C4-5.    Ct Head Code Stroke Wo Contrast 03/24/2018 IMPRESSION:  1. No evidence of  acute intracranial abnormality.  2. ASPECTS is 10.      Transthoracic Echocardiogram 03/24/2018 Study Conclusions  - Left ventricle: The cavity size was normal. Systolic function was   normal. The estimated ejection fraction was in the range of 60%   to 65%. Wall motion was normal; there were no regional wall   motion abnormalities. Doppler parameters are consistent with   abnormal left ventricular relaxation (grade 1 diastolic   dysfunction). - Aortic valve: There was mild regurgitation. Regurgitation   pressure half-time: 534 ms. - Aorta: Ascending aortic AP diameter: 39 mm. - Ascending aorta: The ascending aorta was mildly dilated. - Mitral valve: There was trivial regurgitation. - Right ventricle: Systolic function was normal. - Atrial septum: No defect or patent foramen ovale was identified. - Tricuspid valve: There was trivial regurgitation. - Pulmonic valve: There was trivial regurgitation. Impressions: - No cardiac source of emboli was indentified.    Bilateral Carotid Dopplers 03/24/2018 Final Interpretation: Right Carotid: Velocities in the right ICA are consistent with a 1-39% stenosis. Left Carotid: Velocities in the left ICA are consistent with a 1-39% stenosis. Vertebrals: Bilateral vertebral arteries demonstrate antegrade flow. Subclavians: Normal flow hemodynamics were seen in bilateral subclavian       arteries.    PHYSICAL EXAM Blood pressure 140/77, pulse 89, temperature 98.4 F (36.9 C), temperature source Oral, resp. rate 14, height 5\' 7"  (1.702 m), weight 90.6 kg, SpO2 95 %. Pleasant elderly Caucasian lady not in distress. . Afebrile. Head is nontraumatic. Neck is supple without bruit.    Cardiac exam no murmur or gallop. Lungs are clear to auscultation. Distal pulses are well felt.  Neurological Exam :  Awake alert oriented x 3 normal speech and language.  Extraocular movements are full range without nystagmus.  Mild secondary dysmetria on  left gaze.  Mild left lower face asymmetry. Tongue midline.  Mild left upper and lower extremity drift. Mild diminished fine finger movements on left. Orbits right over left upper extremity. Mild left grip weak.. Left lower extremity 4/5 strength.  Normal sensation . Normal coordination.  Gait deferred.        ASSESSMENT/PLAN [name] is a 77 y.o. female with history of chronic kidney disease, hypertension, hyperlipidemia, diabetes mellitus, depression, status post cardiac ablation for tachycardia, breast cancer, and anxiety presenting with left-sided weakness, left facial droop, and an unsteady gait. She did not receive IV t-PA due to late presentation.  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   MRA head - not performed  Carotid Doppler - unremarkable  2D Echo - EF 60 - 65%.  No cardiac source of emboli identified.   LDL - unable to calculate secondary to high triglycerides (578)  HgbA1c - 8.6  VTE prophylaxis - Lovenox  Diet  - Carb modified with thin liquids  aspirin 81 mg daily prior to admission, now on aspirin 325 mg daily and clopidogrel 75 mg daily  Patient counseled to be compliant with her antithrombotic medications  Ongoing aggressive stroke risk factor management  Therapy recommendations:  CIR  Disposition:  Pending  Hypertension  Stable . Permissive hypertension (OK if < 220/120) but gradually normalize in 5-7 days . Long-term BP goal normotensive  Hyperlipidemia  Lipid lowering medication PTA:  Pravachol 20 mg daily  LDL unable to calculate, goal < 70  Current lipid lowering medication: Lipitor 40 mg daily  Continue statin at discharge  Diabetes  HgbA1c 8.6, goal < 7.0  Uncontrolled  Other Stroke Risk Factors  Advanced age  Obesity, Body mass  index is 31.28 kg/m., recommend weight loss, diet and exercise as appropriate    Other Active Problems  Chronic anemia  CKD     Hospital day # 0  I have personally examined this patient, reviewed notes, independently viewed imaging studies, participated in medical decision making and plan of care.ROS completed by me personally and pertinent positives fully documented  I have made any additions or clarifications directly to the above note.  She presented with slurred speech and left-sided weakness secondary to pontine lacunar infarct due to small vessel disease.  Recommend continue ongoing stroke work-up and aggressive risk factor modification.  Dual antiplatelet therapy for 3 weeks followed by aspirin alone.  Physical occupational therapy and rehab consults.  Long discussion with the patient as well as with her son Dr. Lynann Bologna over the phone.  Discussed with Dr.Ugah  Greater than 50% time during this 35-minute visit was spent on counseling and coordination of care about her lacunar infarct and answering questions. Antony Contras, MD Medical Director [place] Pager: [phone] 03/25/2018 5:32 PM   To contact Stroke Continuity provider, please refer to [email or web address] After hours, contact General Neurology

## 2018-03-25 NOTE — Progress Notes (Signed)
Patient having a good night. Was just in checking and she was cold.  Got her a couple of warmed blankets. Bed alarm on. Safety maintained.

## 2018-03-25 NOTE — Progress Notes (Signed)
PROGRESS NOTE  [name] JME:[id number] DOB: 07/01/1941 DOA: 03/24/2018 PCP: Crist Infante, MD  HPI/Recap of past 24 hours:  HPI: [name] is a 77 y.o. female with hx of DM, right breast cancer who presented to [facility] on 8/30 with left sided weakness and facial droop. CT of the brain showed no acute abnormality. 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 MRA of the brain pending 2-D echo Thanks for neurology consult and recommendation: Aspirin 325 mg daily plus Plavix 75 mg daily for 3 weeks thereafter Plavix 75 mg daily Inpatient rehab consult obtained  Diabetes type 2 insulin-dependent She is on Humalog 75/25, tradjenta, Glucophage We'll start her on insulin 70/30, sliding scale insulin Current hemoglobin A1c is 8.5.  Adjustment will need to be made on her basal insulin based on the sliding scale requirement for better and tighter diabetic control  Dyslipidemia  patient was started on Lipitor at 40 mg per day yesterday.  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, baseline creatinine unknown, presents with a creatinine of 2.0 Hold ACE inhibitor ,trend creatinine  Hypertension Hold antihypertensives Permissive  Hypertension in the setting of stroke workup  Anxiety Continue Abilify, Remeron  Code Status: FULL  Family Communication: SON AT BEDSIDE  Disposition Plan: Inpatient rehab   Consultants:  Physical medicine and rehab  Neurology  Procedures:  NONE  Antimicrobials: NONE  DVT prophylaxis: Lovenox   Objective: Vitals:   03/25/18 0740 03/25/18 1121  BP: 126/62 (!) 161/90  Pulse:  77  Resp: 16   Temp: 98.5 F (36.9 C) 98.4 F (36.9 C)  SpO2:  99%    Intake/Output Summary (Last 24 hours) at 03/25/2018 1501 Last data filed at 03/25/2018 1232 Gross per 24 hour  Intake 877.14 ml  Output -  Net 877.14 ml   Filed Weights   03/24/18 0818  Weight: 90.6 kg   Body mass index is 31.28 kg/m.  Exam:  Constitutional: NAD, calm, comfortable Eyes: PERRL, lids and conjunctivae normal ENMT: Mucous membranes are moist. Posterior pharynx clear of any exudate or lesions.Normal dentition.  Neck: normal, supple, no masses, no thyromegaly Respiratory: clear to auscultation bilaterally, no wheezing, no crackles. Normal respiratory effort. No accessory muscle use.  Cardiovascular: Regular rate and rhythm, no murmurs / rubs / gallops. No extremity edema. 2+ pedal pulses. No carotid bruits.  Abdomen: no tenderness, no masses palpated. No hepatosplenomegaly. Bowel sounds positive.  Musculoskeletal: no clubbing / cyanosis. No joint deformity upper and lower extremities. Good ROM, no contractures. Normal muscle tone.  Skin: no rashes, lesions, ulcers. No induration Neurologic:  4  /5 strength left upper extremity, 3/5 strength in the left lower extremity worse on plantarflexion left facial droop noted, tongue in the midline, speech is slightly slurred Psychiatric: Normal judgment and insight. Alert and oriented x 3. Normal mood.      Data Reviewed: CBC: Recent Labs  Lab 03/24/18 0801 03/24/18 0814 03/25/18 0247  WBC 5.5  --  5.8  NEUTROABS 3.6  --   --   HGB 10.9* 10.9* 11.8*  HCT 32.6* 32.0* 35.6*  MCV 96.2  --  97.0  PLT 197  --  725   Basic Metabolic Panel: Recent Labs  Lab 03/24/18 0801 03/24/18 0814 03/25/18 0247  NA 137 137 140  K 4.8 4.8 4.7  CL 105 104 107  CO2 24  --  23  GLUCOSE 232* 227* 137*  BUN 30* 32* 31*  CREATININE 1.95* 2.00* 1.70*  CALCIUM 9.9  --  9.8   GFR: Estimated  Creatinine Clearance: 32 mL/min (A) (by C-G formula based on SCr of 1.7 mg/dL (H)). Liver Function Tests: Recent Labs  Lab 03/24/18 0801 03/25/18 0247  AST 43* 36  ALT 42 39  ALKPHOS 84 72  BILITOT 0.5 0.8  PROT 6.1* 5.9*  ALBUMIN 3.4* 3.1*   No results for input(s): LIPASE, AMYLASE in the last 168 hours. No results for input(s): AMMONIA in the last 168 hours. Coagulation Profile: Recent Labs  Lab 03/24/18 0801  INR 1.02   Cardiac Enzymes: Recent Labs  Lab 03/24/18 1154 03/24/18 1935 03/25/18 0247  TROPONINI <0.03 <0.03 <0.03   BNP (last 3 results) No results for input(s): PROBNP in the last 8760 hours. HbA1C: Recent Labs    03/24/18 0801 03/25/18 0247  HGBA1C 8.2* 8.6*   CBG: Recent Labs  Lab 03/24/18 1326 03/24/18 1704 03/24/18 1950 03/25/18 0605 03/25/18 1133  GLUCAP 162* 175* 172* 161* 192*   Lipid Profile: Recent Labs    03/25/18 0247  CHOL 189  HDL 25*  LDLCALC UNABLE TO CALCULATE IF TRIGLYCERIDE OVER 400 mg/dL  TRIG 578*  CHOLHDL 7.6   Thyroid Function Tests: No results for input(s): TSH, T4TOTAL, FREET4, T3FREE, THYROIDAB in the last 72 hours. Anemia Panel: No results for input(s): VITAMINB12, FOLATE, FERRITIN, TIBC, IRON, RETICCTPCT in the last 72 hours. Urine analysis: No results found for: COLORURINE, APPEARANCEUR, LABSPEC, PHURINE, GLUCOSEU, HGBUR, BILIRUBINUR, KETONESUR, PROTEINUR, UROBILINOGEN, NITRITE, LEUKOCYTESUR Sepsis Labs: @LABRCNTIP (procalcitonin:4,lacticidven:4)  )No results found for this or any previous visit (from the past 240 hour(s)).    Studies: No results found.  Scheduled Meds: . ARIPiprazole  5 mg Oral Daily  . aspirin EC  325 mg Oral Once  . aspirin  325 mg Oral Daily  . atorvastatin  40 mg Oral q1800  . clopidogrel  75 mg Oral Daily  . enoxaparin (LOVENOX) injection  40 mg Subcutaneous Q24H  . insulin aspart  0-9 Units Subcutaneous TID WC  . insulin aspart protamine- aspart  10 Units Subcutaneous BID WC    . LORazepam  0.5 mg Oral QID  . mirtazapine  15 mg Oral QHS  . venlafaxine XR  75 mg Oral QODAY    Continuous Infusions: . sodium chloride    . sodium chloride 75 mL/hr at 03/25/18 0454     LOS: 0 days     Cristal Deer, MD [facility]  To reach me or the doctor on call, go to: [email or web address] Password [email or web address]  03/25/2018, 3:01 PM

## 2018-03-25 NOTE — Progress Notes (Signed)
Rehab Admissions Coordinator Note:  Per PT and OT recommendation, Patient was screened by Jhonnie Garner for appropriateness for an Inpatient Acute Rehab Consult.  At this time, we are recommending Inpatient Rehab consult. AC will contact MD regarding IP Rehab Consult Order.   Jhonnie Garner 03/25/2018, 11:36 AM  I can be reached at [phone].

## 2018-03-25 NOTE — Consult Note (Signed)
Physical Medicine and Rehabilitation Consult Reason for Consult:left sided weaknes Referring Physician: Leonie Man   HPI: [name] is a 77 y.o. female with hx of DM, right breast cancer who presented to [facility] on 8/30 with left sided weakness and facial droop. CT of the brain showed no acute abnormality. 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 Pt has demonstrated ongoing left sided motor deficits and PM&R was consulted to assess for potential rehab needs.    Review of Systems  Constitutional: Negative for fever.  HENT: Negative for hearing loss.   Eyes: Negative for blurred vision.  Respiratory: Positive for cough.   Cardiovascular: Negative for chest pain.  Gastrointestinal: Negative for nausea.  Genitourinary: Positive for urgency.  Musculoskeletal: Negative for myalgias.  Skin: Negative for rash.  Neurological: Positive for focal weakness.  Psychiatric/Behavioral: Negative for depression.   Past Medical History:  Diagnosis Date  . Anxiety   . Back ache   . Breast cancer ([place])    right  . Bronchitis   . Cancer of colon ([place])   . Depression   . DM (diabetes mellitus) ([place])   . Dyspnea on exertion   . Fall on steps    age 77 feel down iron stairs couldnot move leg for 6 mths  . Family history of primary TB   . Fracture of ankle, closed 2014   left  . History of chicken pox   . History of primary TB   . Hyperlipidemia   . Hypertension   . Kidney disease, chronic, stage III (moderate, EGFR 30-59 ml/min) ([place]) 2017  . Kidney disorder    reduction function  . Kidney stones   . Knee pain, right   . Lung abnormality    age 39 patient had pna spot on lungs   . Microalbuminuria   . Muscle cramps    both legs  . Muscular degeneration    of right eye  . Nephrolithiasis   . Palpitations   . Personal history of radiation  therapy    Past Surgical History:  Procedure Laterality Date  . ABLATION  2007   fast heart rate  . ANKLE FRACTURE SURGERY Left 2014  . BREAST LUMPECTOMY Right   . CATARACT EXTRACTION Right 2017  . colonscopy  2015  . HYSTEROTOMY     77 years ago  . TONSILLECTOMY AND ADENOIDECTOMY     age 39   Family History  Problem Relation Age of Onset  . Peripheral Artery Disease Mother 19       HX OF POOR CIRCULATION, SMOKING, LEG AMPUTATION  . Cancer Father   . Asthma Brother 15  . Kidney Stones Brother   . Breast cancer Neg Hx    Social History:  reports that she has never smoked. She has never used smokeless tobacco. She reports that she does not drink alcohol or use drugs. Allergies: No Known Allergies Medications Prior to Admission  Medication Sig Dispense Refill  . amLODipine (NORVASC) 5 MG tablet Take 5 mg by mouth daily.    . ARIPiprazole (ABILIFY) 5 MG tablet Take 5 mg daily by mouth.  3  . aspirin EC 81 MG tablet Take 81 mg by mouth daily.    . calcium carbonate (OSCAL) 1500 (600 Ca) MG TABS tablet Take 600 mg of elemental calcium by mouth 2 (two) times daily with a meal.    . HUMALOG  MIX 75/25 KWIKPEN (75-25) 100 UNIT/ML Kwikpen Take 38-44 Units by mouth See admin instructions. Take 44 units in the morning and 38 units in the evening  3  . Icosapent Ethyl (VASCEPA) 1 g CAPS Take 1 g by mouth 2 (two) times daily.    Marland Kitchen linagliptin (TRADJENTA) 5 MG TABS tablet Take 5 mg by mouth daily.    Marland Kitchen lisinopril (PRINIVIL,ZESTRIL) 10 MG tablet Take 10 mg by mouth daily.    Marland Kitchen LORazepam (ATIVAN) 0.5 MG tablet Take 0.5 mg by mouth 4 (four) times daily.    . metFORMIN (GLUCOPHAGE) 500 MG tablet Take 500 mg by mouth 2 (two) times daily with a meal.    . mirtazapine (REMERON) 15 MG tablet Take 15 mg by mouth at bedtime.    . multivitamin-lutein (OCUVITE-LUTEIN) CAPS capsule Take 2 capsules by mouth daily.    Marland Kitchen omega-3 acid ethyl esters (LOVAZA) 1 g capsule Take 1 g by mouth 2 (two) times daily.      . pravastatin (PRAVACHOL) 20 MG tablet Take 20 mg by mouth at bedtime.     Marland Kitchen venlafaxine XR (EFFEXOR-XR) 75 MG 24 hr capsule Take 75 mg by mouth every other day.      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 Functional Status:  Mobility: Bed Mobility Overal bed mobility: Needs Assistance Bed Mobility: Sit to Supine Supine to sit: Mod assist Sit to supine: Mod assist General bed mobility comments: Assist to elevate trunk from sidelying and subsequently assist to bring BLE's onto bed once returned Transfers Overall transfer level: Needs assistance Transfers: Sit to/from Stand, Stand Pivot Transfers Sit to Stand: Mod assist Stand pivot transfers: Mod assist(appaent coordination deficits) General transfer comment: posterior lean able to correct with vc  Ambulation/Gait Ambulation/Gait assistance: Mod assist, +2 physical assistance Gait Distance (Feet): 10 Feet Assistive device: 2 person hand held assist Gait Pattern/deviations: Step-through pattern, Decreased stride length, Decreased dorsiflexion - right, Decreased dorsiflexion - left, Shuffle, Narrow base of support, Leaning posteriorly General Gait Details: Patient with posterior bias and difficulty with anterior weight shift. Cues for weight shifting and movement initiation Gait velocity: decreased Gait velocity interpretation: <1.31 ft/sec, indicative of household ambulator    ADL: ADL Overall ADL's : Needs assistance/impaired Eating/Feeding: Set up, Sitting Grooming:  Sitting Upper Body Bathing: Minimal assistance Lower Body Bathing: Moderate assistance, Sit to/from stand Upper Body Dressing : Moderate assistance, Sitting Lower Body Dressing: Moderate assistance, Sit to/from stand Toilet Transfer: Moderate assistance, Stand-pivot Toileting- Clothing Manipulation and Hygiene: Moderate assistance Functional mobility during ADLs: Moderate assistance(A to lift and control descent)  Cognition: Cognition Overall Cognitive Status: Impaired/Different from baseline Orientation Level: Oriented X4 Cognition Arousal/Alertness: Awake/alert Behavior During Therapy: Anxious Overall Cognitive Status: Impaired/Different from baseline Area of Impairment: Attention, Memory, Awareness Current Attention Level: Selective Memory: Decreased short-term memory Awareness: Emergent  Blood pressure 140/77, pulse 89, temperature 98.4 F (36.9 C), temperature source Oral, resp. rate 14, height '5\' 7"'$  (1.702 m), weight 90.6 kg, SpO2 95 %. Physical Exam  Constitutional: She is oriented to person, place, and time. She appears well-developed.  obese  HENT:  Head: Normocephalic and atraumatic.  Eyes: Pupils are equal, round, and reactive to  light.  Neck: Normal range of motion.  Cardiovascular: Normal rate.  Respiratory: Effort normal.  GI: Soft.  Musculoskeletal: She exhibits no edema.  Neurological: She is alert and oriented to person, place, and time.  Left central 7, voice strong but slightly dysarthric. Cognitively diplays good insight and awareness. Functional memory, normal language. RUE and RLE 5/5. LUE 3+ to 4-/5. LLE 3 to 3+/5. Senses pain and light touch on all 4. No resting tone  Skin: Skin is warm.  Psychiatric: She has a normal mood and affect. Her behavior is normal.    Results for orders placed or performed during the hospital encounter of 03/24/18 (from the past 24 hour(s))  Troponin I     Status: None   Collection Time: 03/24/18  7:35 PM  Result Value Ref  Range   Troponin I <0.03 <0.03 ng/mL  Glucose, capillary     Status: Abnormal   Collection Time: 03/24/18  7:50 PM  Result Value Ref Range   Glucose-Capillary 172 (H) 70 - 99 mg/dL   Comment 1 Notify RN    Comment 2 Document in Chart   Troponin I     Status: None   Collection Time: 03/25/18  2:47 AM  Result Value Ref Range   Troponin I <0.03 <0.03 ng/mL  Hemoglobin A1c     Status: Abnormal   Collection Time: 03/25/18  2:47 AM  Result Value Ref Range   Hgb A1c MFr Bld 8.6 (H) 4.8 - 5.6 %   Mean Plasma Glucose 200.12 mg/dL  Lipid panel     Status: Abnormal   Collection Time: 03/25/18  2:47 AM  Result Value Ref Range   Cholesterol 189 0 - 200 mg/dL   Triglycerides 578 (H) <150 mg/dL   HDL 25 (L) >40 mg/dL   Total CHOL/HDL Ratio 7.6 RATIO   VLDL UNABLE TO CALCULATE IF TRIGLYCERIDE OVER 400 mg/dL 0 - 40 mg/dL   LDL Cholesterol UNABLE TO CALCULATE IF TRIGLYCERIDE OVER 400 mg/dL 0 - 99 mg/dL  CBC     Status: Abnormal   Collection Time: 03/25/18  2:47 AM  Result Value Ref Range   WBC 5.8 4.0 - 10.5 K/uL   RBC 3.67 (L) 3.87 - 5.11 MIL/uL   Hemoglobin 11.8 (L) 12.0 - 15.0 g/dL   HCT 35.6 (L) 36.0 - 46.0 %   MCV 97.0 78.0 - 100.0 fL   MCH 32.2 26.0 - 34.0 pg   MCHC 33.1 30.0 - 36.0 g/dL   RDW 13.1 11.5 - 15.5 %   Platelets 191 150 - 400 K/uL  Comprehensive metabolic panel     Status: Abnormal   Collection Time: 03/25/18  2:47 AM  Result Value Ref Range   Sodium 140 135 - 145 mmol/L   Potassium 4.7 3.5 - 5.1 mmol/L   Chloride 107 98 - 111 mmol/L   CO2 23 22 - 32 mmol/L   Glucose, Bld 137 (H) 70 - 99 mg/dL   BUN 31 (H) 8 - 23 mg/dL   Creatinine, Ser 1.70 (H) 0.44 - 1.00 mg/dL   Calcium 9.8 8.9 - 10.3 mg/dL   Total Protein 5.9 (L) 6.5 - 8.1 g/dL   Albumin 3.1 (L) 3.5 - 5.0 g/dL   AST 36 15 - 41 U/L   ALT 39 0 - 44 U/L   Alkaline Phosphatase 72 38 - 126 U/L   Total Bilirubin 0.8 0.3 - 1.2 mg/dL   GFR calc non Af Amer 28 (L) >60 mL/min   GFR calc  Af Amer 32 (L) >60 mL/min    Anion gap 10 5 - 15  Glucose, capillary     Status: Abnormal   Collection Time: 03/25/18  6:05 AM  Result Value Ref Range   Glucose-Capillary 161 (H) 70 - 99 mg/dL   Comment 1 Notify RN    Comment 2 Document in Chart   Glucose, capillary     Status: Abnormal   Collection Time: 03/25/18 11:33 AM  Result Value Ref Range   Glucose-Capillary 192 (H) 70 - 99 mg/dL   Comment 1 Notify RN    Comment 2 Document in Chart   Glucose, capillary     Status: Abnormal   Collection Time: 03/25/18  4:22 PM  Result Value Ref Range   Glucose-Capillary 170 (H) 70 - 99 mg/dL   Comment 1 Notify RN    Comment 2 Document in Chart    Mr Brain Wo Contrast  Result Date: 03/24/2018 CLINICAL DATA:  Acute onset of left-sided weakness and facial droop. Last known well yesterday evening. EXAM: MRI HEAD WITHOUT CONTRAST MRA HEAD WITHOUT CONTRAST TECHNIQUE: Multiplanar, multiecho pulse sequences of the brain and surrounding structures were obtained without intravenous contrast. Angiographic images of the head were obtained using MRA technique without contrast. COMPARISON:  None. FINDINGS: MRI HEAD FINDINGS Brain: The diffusion-weighted images demonstrate an acute nonhemorrhagic 12 mm linear right paramedian pontine infarct. No other acute infarct is present. Subtle T2 signal changes are associated with the acute/subacute infarct. Scattered subcortical T2 hyperintensities bilaterally are mildly advanced for age. Ventricles are of normal size. No other cortical lesions are present. No significant extra-axial fluid collection is present. The cerebellum is within normal limits. The internal auditory canals are normal. Vascular: Flow is present in the major intracranial arteries. The left vertebral artery is dominant. Skull and upper cervical spine: The skull base is within normal limits. Craniocervical junction is within normal limits. Is present at C4-5. Marrow signal is normal. Sinuses/Orbits: The paranasal sinuses and mastoid air  cells are clear. Bilateral lens replacements are present. Globes and orbits are within normal limits. MRA HEAD FINDINGS Internal carotid arteries demonstrate normal flow signal the high cervical segments through the ICA termini bilaterally. The A1 and M1 segments are normal. The anterior communicating artery is patent. MCA bifurcations are within normal limits. ACA and MCA branch vessels are normal. The left vertebral artery is the dominant vessel. The right vertebral artery is hypoplastic with segmental narrowing through the V4 segment. The right vertebral artery essentially terminates at the PICA. The left PICA origin is visualized and normal. The basilar artery is normal. The right posterior cerebral artery originates from basilar tip. The left posterior cerebral artery is of fetal type. There is some attenuation of distal PCA branch vessels bilaterally IMPRESSION: 1. 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 2. Scattered subcortical T2 hyperintensities bilaterally are mildly advanced for age. This likely reflects the sequela of chronic microvascular ischemia. 3. MRA circle-of-Willis demonstrates mild distal small vessel disease without significant proximal stenosis, aneurysm, or branch vessel occlusion. 4. Degenerative changes of the cervical spine at C4-5. These results were called by telephone at the time of interpretation on 03/24/2018 at 11:37 am to Dr. Charlesetta Shanks , who verbally acknowledged these results. Electronically Signed   By: San Morelle M.D.   On: 03/24/2018 11:41   Mr Jodene Nam Head (cerebral Arteries)  Result Date: 03/24/2018 CLINICAL DATA:  Acute onset of left-sided weakness and facial droop. Last known well yesterday evening. EXAM: MRI  HEAD WITHOUT CONTRAST MRA HEAD WITHOUT CONTRAST TECHNIQUE: Multiplanar, multiecho pulse sequences of the brain and surrounding structures were obtained without intravenous contrast. Angiographic images of the head were obtained using  MRA technique without contrast. COMPARISON:  None. FINDINGS: MRI HEAD FINDINGS Brain: The diffusion-weighted images demonstrate an acute nonhemorrhagic 12 mm linear right paramedian pontine infarct. No other acute infarct is present. Subtle T2 signal changes are associated with the acute/subacute infarct. Scattered subcortical T2 hyperintensities bilaterally are mildly advanced for age. Ventricles are of normal size. No other cortical lesions are present. No significant extra-axial fluid collection is present. The cerebellum is within normal limits. The internal auditory canals are normal. Vascular: Flow is present in the major intracranial arteries. The left vertebral artery is dominant. Skull and upper cervical spine: The skull base is within normal limits. Craniocervical junction is within normal limits. Is present at C4-5. Marrow signal is normal. Sinuses/Orbits: The paranasal sinuses and mastoid air cells are clear. Bilateral lens replacements are present. Globes and orbits are within normal limits. MRA HEAD FINDINGS Internal carotid arteries demonstrate normal flow signal the high cervical segments through the ICA termini bilaterally. The A1 and M1 segments are normal. The anterior communicating artery is patent. MCA bifurcations are within normal limits. ACA and MCA branch vessels are normal. The left vertebral artery is the dominant vessel. The right vertebral artery is hypoplastic with segmental narrowing through the V4 segment. The right vertebral artery essentially terminates at the PICA. The left PICA origin is visualized and normal. The basilar artery is normal. The right posterior cerebral artery originates from basilar tip. The left posterior cerebral artery is of fetal type. There is some attenuation of distal PCA branch vessels bilaterally IMPRESSION: 1. 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 2. Scattered subcortical T2 hyperintensities bilaterally are mildly advanced for age.  This likely reflects the sequela of chronic microvascular ischemia. 3. MRA circle-of-Willis demonstrates mild distal small vessel disease without significant proximal stenosis, aneurysm, or branch vessel occlusion. 4. Degenerative changes of the cervical spine at C4-5. These results were called by telephone at the time of interpretation on 03/24/2018 at 11:37 am to Dr. Charlesetta Shanks , who verbally acknowledged these results. Electronically Signed   By: San Morelle M.D.   On: 03/24/2018 11:41   Ct Head Code Stroke Wo Contrast  Result Date: 03/24/2018 CLINICAL DATA:  Code stroke.  Left-sided weakness. EXAM: CT HEAD WITHOUT CONTRAST TECHNIQUE: Contiguous axial images were obtained from the base of the skull through the vertex without intravenous contrast. COMPARISON:  None. FINDINGS: Brain: There is no evidence of acute infarct, intracranial hemorrhage, mass, midline shift, or extra-axial fluid collection. The ventricles and sulci are within normal limits for age. Scattered cerebral white matter hypodensities are nonspecific but compatible with mild chronic small vessel ischemic disease. Vascular: Calcified atherosclerosis at the skull base. No hyperdense vessel. Skull: No fracture or focal osseous lesion. Sinuses/Orbits: Visualized paranasal sinuses and mastoid air cells are clear. Bilateral cataract extraction is noted. Other: None. ASPECTS [facility] Stroke Program Early CT Score) - Ganglionic level infarction (caudate, lentiform nuclei, internal capsule, insula, M1-M3 cortex): 7 - Supraganglionic infarction (M4-M6 cortex): 3 Total score (0-10 with 10 being normal): 10 IMPRESSION: 1. No evidence of acute intracranial abnormality. 2. ASPECTS is 10. 3. Mild chronic small vessel ischemic disease. These results were communicated to Dr. Lorraine Lax at 8:19 am on 03/24/2018 by text page via the [facility] messaging system. Electronically Signed   By: Seymour Bars.D.  On: 03/24/2018 08:20     Assessment/Plan: Diagnosis: right pontine infarct 1. Does the need for close, 24 hr/day medical supervision in concert with the patient's rehab needs make it unreasonable for this patient to be served in a less intensive setting? Yes 2. Co-Morbidities requiring supervision/potential complications: HTN, post-stroke sequelae, neurogenic bowel and bladder 3. Due to bladder management, bowel management, safety, skin/wound care, disease management, medication administration and patient education, does the patient require 24 hr/day rehab nursing? Yes 4. Does the patient require coordinated care of a physician, rehab nurse, PT (1-2 hrs/day, 5 days/week), OT (1-2 hrs/day, 5 days/week) and SLP (1-2 hrs/day, 5 days/week) to address physical and functional deficits in the context of the above medical diagnosis(es)? Yes Addressing deficits in the following areas: balance, endurance, locomotion, strength, transferring, bowel/bladder control, bathing, dressing, feeding, grooming, toileting, speech, swallowing and psychosocial support 5. Can the patient actively participate in an intensive therapy program of at least 3 hrs of therapy per day at least 5 days per week? Yes 6. The potential for patient to make measurable gains while on inpatient rehab is excellent 7. Anticipated functional outcomes upon discharge from inpatient rehab are modified independent  with PT, modified independent with OT, modified independent with SLP. 8. Estimated rehab length of stay to reach the above functional goals is: 13-19 days 9. Anticipated D/C setting: Home 10. Anticipated post D/C treatments: [place] therapy 11. Overall Rehab/Functional Prognosis: excellent  RECOMMENDATIONS: This patient's condition is appropriate for continued rehabilitative care in the following setting: CIR Patient has agreed to participate in recommended program. Yes Note that insurance prior authorization may be required for reimbursement for recommended  care.  Comment: Rehab Admissions Coordinator to follow up.  Thanks,  Meredith Staggers, MD, Mellody Drown  I have personally performed a face to face diagnostic evaluation of this patient. Additionally, I have reviewed and concur with the physician assistant's documentation above.    Meredith Staggers, MD 03/25/2018

## 2018-03-26 DIAGNOSIS — I639 Cerebral infarction, unspecified: Secondary | ICD-10-CM

## 2018-03-26 LAB — CBC WITH DIFFERENTIAL/PLATELET
Abs Immature Granulocytes: 0 10*3/uL (ref 0.0–0.1)
Basophils Absolute: 0 10*3/uL (ref 0.0–0.1)
Basophils Relative: 0 %
Eosinophils Absolute: 0.1 10*3/uL (ref 0.0–0.7)
Eosinophils Relative: 3 %
HCT: 33.1 % — ABNORMAL LOW (ref 36.0–46.0)
Hemoglobin: 11 g/dL — ABNORMAL LOW (ref 12.0–15.0)
Immature Granulocytes: 0 %
Lymphocytes Relative: 18 %
Lymphs Abs: 1 10*3/uL (ref 0.7–4.0)
MCH: 31.6 pg (ref 26.0–34.0)
MCHC: 33.2 g/dL (ref 30.0–36.0)
MCV: 95.1 fL (ref 78.0–100.0)
Monocytes Absolute: 0.4 10*3/uL (ref 0.1–1.0)
Monocytes Relative: 7 %
Neutro Abs: 3.7 10*3/uL (ref 1.7–7.7)
Neutrophils Relative %: 72 %
Platelets: 187 10*3/uL (ref 150–400)
RBC: 3.48 MIL/uL — ABNORMAL LOW (ref 3.87–5.11)
RDW: 12.8 % (ref 11.5–15.5)
WBC: 5.2 10*3/uL (ref 4.0–10.5)

## 2018-03-26 LAB — GLUCOSE, CAPILLARY
Glucose-Capillary: 206 mg/dL — ABNORMAL HIGH (ref 70–99)
Glucose-Capillary: 223 mg/dL — ABNORMAL HIGH (ref 70–99)
Glucose-Capillary: 166 mg/dL — ABNORMAL HIGH (ref 70–99)
Glucose-Capillary: 124 mg/dL — ABNORMAL HIGH (ref 70–99)

## 2018-03-26 LAB — BASIC METABOLIC PANEL
Anion gap: 7 (ref 5–15)
BUN: 31 mg/dL — ABNORMAL HIGH (ref 8–23)
CO2: 23 mmol/L (ref 22–32)
Calcium: 9.9 mg/dL (ref 8.9–10.3)
Chloride: 110 mmol/L (ref 98–111)
Creatinine, Ser: 1.8 mg/dL — ABNORMAL HIGH (ref 0.44–1.00)
GFR calc Af Amer: 30 mL/min — ABNORMAL LOW (ref >60)
GFR calc non Af Amer: 26 mL/min — ABNORMAL LOW (ref >60)
Glucose, Bld: 228 mg/dL — ABNORMAL HIGH (ref 70–99)
Potassium: 4.4 mmol/L (ref 3.5–5.1)
Sodium: 140 mmol/L (ref 135–145)

## 2018-03-26 MED ORDER — BISACODYL 10 MG RE SUPP
10.0000 mg | Freq: Every day | RECTAL | Status: DC | PRN
  Filled 2018-03-26: qty 1

## 2018-03-26 MED ORDER — LORAZEPAM 0.5 MG PO TABS
0.5000 mg | ORAL_TABLET | Freq: Once | ORAL | Status: AC
  Administered 2018-03-26: 0.5 mg via ORAL

## 2018-03-26 NOTE — Progress Notes (Signed)
PROGRESS NOTE    [name]  GUR:[id number] DOB: Oct 08, 1940 DOA: 03/24/2018 PCP: Crist Infante, MD    Brief Narrative: 77 y.o.femalewith hx of DM, right breast cancer who [facility] with left sided weakness and facial droop. CT of the brain showed no acute abnormality. 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 Pt has demonstrated ongoing left sided motor deficits   Assessment & Plan:   Active Problems:   TIA (transient ischemic attack)  Acute brainstem infarct CT head negative MRIAcute non hemorrhagic 12 mm right paramedian pontine infarct. MRA of the brain MRA of the circle of Willis shows mild distal small vessel disease without significant proximal stenosis aneurysm or branch occlusion. 2-D echo ejection fraction 60 to 65% no cardiac source of emboli Thanks for neurology consult and recommendation: Aspirin 325 mg daily plus Plavix 75 mg daily for 3 weeks thereafter Plavix 75 mg daily Inpatient rehab consult noted insurance authorization pending.  Diabetes type 2 insulin-dependent She is on Humalog 75/25, tradjenta, Glucophage We'll start her on insulin 70/30, sliding scale insulin Current hemoglobin A1c is 8.5.  Adjustment will need to be made on her basal insulin based on the sliding scale requirement for better and tighter diabetic control  Dyslipidemia  patient was started on Lipitor at 40 mg per day yesterday.  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  She appears to be eating and drinking well.   Objective: Vitals:   03/26/18 0000 03/26/18 0325 03/26/18 0843 03/26/18 1148  BP: (!) 147/63 (!) 143/78 140/72 (!) 155/88  Pulse: 97 80 100 91  Resp:   20 20  Temp: 97.9 F (36.6 C) 97.7 F (36.5 C) 97.7 F (36.5 C) 97.9 F (36.6 C)  TempSrc: Axillary Axillary Oral Oral  SpO2: 96% 99% 100% 95%  Weight:      Height:        Intake/Output Summary (Last 24 hours) at 03/26/2018 1208 Last data filed at 03/26/2018 0844 Gross per 24 hour  Intake 720 ml  Output -  Net 720 ml   Filed Weights   03/24/18 0818  Weight: 90.6 kg    Examination:  General exam: Appears calm and comfortable  Respiratory system: Clear to auscultation. Respiratory effort normal. Cardiovascular system: S1 & S2 heard, RRR. No JVD, murmurs, rubs, gallops or clicks. No pedal edema. Gastrointestinal system: Abdomen is nondistended, soft and nontender. No organomegaly or masses felt. Normal bowel sounds heard. Central nervous system: Alert and oriented. lue weakness Extremities: Symmetric 5 x 5 power. Skin: No rashes, lesions or ulcers Psychiatry: Judgement and insight appear normal. Mood & affect appropriate.     Data Reviewed: I have personally reviewed following labs and imaging studies  CBC: Recent Labs  Lab 03/24/18 0801 03/24/18 0814 03/25/18 0247 03/26/18 0644  WBC 5.5  --  5.8 5.2  NEUTROABS 3.6  --   --  3.7  HGB 10.9* 10.9* 11.8* 11.0*  HCT 32.6* 32.0* 35.6* 33.1*  MCV 96.2  --  97.0 95.1  PLT 197  --  191 010   Basic Metabolic Panel: Recent Labs  Lab 03/24/18 0801 03/24/18 0814 03/25/18 0247 03/26/18 0644  NA 137 137 140 140  K 4.8 4.8 4.7 4.4  CL 105 104 107 110  CO2 24  --  23 23  GLUCOSE 232* 227* 137* 228*  BUN 30* 32* 31* 31*   CREATININE 1.95* 2.00* 1.70* 1.80*  CALCIUM 9.9  --  9.8 9.9   GFR: Estimated Creatinine Clearance: 30.2 mL/min (A) (by C-G formula based on SCr of 1.8 mg/dL (H)). Liver Function Tests: Recent Labs  Lab 03/24/18 0801 03/25/18 0247  AST 43* 36  ALT 42 39  ALKPHOS 84 72  BILITOT 0.5 0.8  PROT 6.1* 5.9*  ALBUMIN 3.4* 3.1*   No results for input(s): LIPASE, AMYLASE in the last 168 hours. No results for input(s): AMMONIA in the last 168 hours. Coagulation Profile: Recent Labs  Lab 03/24/18 0801  INR 1.02   Cardiac Enzymes: Recent Labs  Lab 03/24/18 1154 03/24/18 1935 03/25/18 0247  TROPONINI <0.03 <0.03 <0.03   BNP (last 3 results) No results for input(s): PROBNP in the last 8760 hours. HbA1C: Recent Labs    03/24/18 0801 03/25/18 0247  HGBA1C 8.2* 8.6*   CBG: Recent Labs  Lab 03/25/18 1622 03/25/18 2200 03/25/18 2338 03/26/18 0642 03/26/18 1142  GLUCAP 170* 213* 191* 206* 223*   Lipid Profile: Recent Labs    03/25/18 0247  CHOL 189  HDL 25*  LDLCALC UNABLE TO CALCULATE IF TRIGLYCERIDE OVER 400 mg/dL  TRIG 578*  CHOLHDL 7.6   Thyroid Function Tests: No results for input(s): TSH, T4TOTAL, FREET4, T3FREE, THYROIDAB in the last 72 hours. Anemia Panel: No results for input(s): VITAMINB12, FOLATE, FERRITIN, TIBC, IRON, RETICCTPCT in the last 72 hours. Sepsis Labs: No results for input(s): PROCALCITON, LATICACIDVEN in the last 168 hours.  No results found for this or any previous visit (from the past 240 hour(s)).       Radiology Studies: No results found.      Scheduled Meds: . ARIPiprazole  5 mg Oral Daily  . aspirin EC  325 mg Oral Once  . aspirin  325 mg Oral Daily  . atorvastatin  80 mg Oral q1800  . clopidogrel  75 mg Oral Daily  . enoxaparin (LOVENOX) injection  40 mg Subcutaneous Q24H  . insulin aspart  0-9 Units Subcutaneous TID WC  . insulin aspart protamine- aspart  10 Units Subcutaneous BID WC  . LORazepam  0.5 mg Oral QID   . mirtazapine  15 mg Oral QHS  . venlafaxine XR  75 mg Oral QODAY   Continuous Infusions: . sodium chloride    . sodium chloride 75 mL/hr at 03/25/18 2130     LOS: 1 day     Georgette Shell, MD   If 7PM-7AM, please contact night-coverage [email or web address] Password [email or web address] 03/26/2018, 12:08 PM

## 2018-03-26 NOTE — Evaluation (Signed)
Speech Language Pathology Evaluation Patient Details Name: [name] MRN: [id number] DOB: 1941/04/29 Today's Date: 03/26/2018 Time: 1340-1406 SLP Time Calculation (min) (ACUTE ONLY): 26 min  Problem List:  Patient Active Problem List   Diagnosis Date Noted  . Acute ischemic stroke ([place])   . TIA (transient ischemic attack) 03/24/2018  . Dyspnea on exertion 05/30/2017  . Sinus tachycardia 05/30/2017  . Mixed hyperlipidemia 05/30/2017  . Essential hypertension 05/30/2017   Past Medical History:  Past Medical History:  Diagnosis Date  . Anxiety   . Back ache   . Breast cancer ([place])    right  . Bronchitis   . Cancer of colon ([place])   . Depression   . DM (diabetes mellitus) ([place])   . Dyspnea on exertion   . Fall on steps    age 27 feel down iron stairs couldnot move leg for 6 mths  . Family history of primary TB   . Fracture of ankle, closed 2014   left  . History of chicken pox   . History of primary TB   . Hyperlipidemia   . Hypertension   . Kidney disease, chronic, stage III (moderate, EGFR 30-59 ml/min) ([place]) 2017  . Kidney disorder    reduction function  . Kidney stones   . Knee pain, right   . Lung abnormality    age 73 patient had pna spot on lungs   . Microalbuminuria   . Muscle cramps    both legs  . Muscular degeneration    of right eye  . Nephrolithiasis   . Palpitations   . Personal history of radiation therapy    Past Surgical History:  Past Surgical History:  Procedure Laterality Date  . ABLATION  2007   fast heart rate  . ANKLE FRACTURE SURGERY Left 2014  . BREAST LUMPECTOMY Right   . CATARACT EXTRACTION Right 2017  . colonscopy  2015  . HYSTEROTOMY     45 years ago  . TONSILLECTOMY AND ADENOIDECTOMY     age 60   HPI:  [name] is a 77 y.o. female with history of chronic kidney disease, hypertension, hyperlipidemia, diabetes mellitus, depression, status post cardiac ablation for tachycardia, breast cancer, and anxiety presenting  with left-sided weakness, left facial droop, and an unsteady gait. She did not receive IV t-PA due to late presentation. 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    Assessment / Plan / Recommendation Clinical Impression   Patient presents with mild cognitive communication impairment and mild dysarthria, with deficits seen today in higher level attention, working memory, recall, problem solving, organization and emergent awareness. She scored 22/30 on MOCA-Basic (26 and above considered normal), indicating mild impairment. In supplemental problem solving and organization tasks, mod cues required and slow processing noted. Pt with some awareness of errors, though occasional question cues required. Initially pt denied cognitive changes but after testing she agreed she is having trouble focusing, and feels she would have been able to solve simple money problems more easily, as she used to be a Scientist, water quality. Mild dysarthria due to left facial weakness with no significant impacts on intelligibility. I recommend skilled ST to address cognitive deficits in order to improve independence, safety awareness, communication and quality of life, as pt lived independently prior to CVA. Will follow acutely.    SLP Assessment  SLP Recommendation/Assessment: Patient needs continued Speech Lanaguage Pathology Services SLP Visit Diagnosis: Cognitive communication deficit (R41.841);Dysarthria and anarthria (R47.1)    Follow  Up Recommendations  Inpatient Rehab    Frequency and Duration min 2x/week  2 weeks      SLP Evaluation Cognition  Overall Cognitive Status: Impaired/Different from baseline Arousal/Alertness: Awake/alert Orientation Level: Oriented X4 Attention: Selective;Alternating Selective Attention: Appears intact Alternating Attention: Impaired Memory: Impaired Memory Impairment: Storage deficit;Decreased recall of new information(delayed recall 3/5) Awareness:  Impaired Awareness Impairment: Emergent impairment("that's hard for me" "I guess my thinking is different.") Problem Solving: Impaired Problem Solving Impairment: Verbal basic(simple time 3/3, money 1/3 extended time, slow processing) Executive Function: Reasoning Reasoning: Impaired Reasoning Impairment: Verbal basic(abstraction, category naming impaired ) Safety/Judgment: Impaired       Comprehension  Auditory Comprehension Overall Auditory Comprehension: Appears within functional limits for tasks assessed Yes/No Questions: Within Functional Limits Commands: Within Functional Limits Conversation: Simple Interfering Components: Attention;Processing speed;Working Field seismologist: Conservation officer, nature: Within [employer] Reading Comprehension Reading Status: Within funtional limits    Expression Expression Primary Mode of Expression: Verbal Verbal Expression Overall Verbal Expression: Appears within functional limits for tasks assessed Written Expression Dominant Hand: Right Written Expression: Within Functional Limits(sentence level)   Oral / Motor  Oral Motor/Sensory Function Overall Oral Motor/Sensory Function: Mild impairment Facial ROM: Reduced left;Suspected CN VII (facial) dysfunction Facial Symmetry: Abnormal symmetry left;Suspected CN VII (facial) dysfunction Facial Strength: Reduced left;Suspected CN VII (facial) dysfunction Lingual ROM: Within Functional Limits Lingual Symmetry: Within Functional Limits Lingual Strength: Within Functional Limits Velum: Within Functional Limits Mandible: Within Functional Limits Motor Speech Overall Motor Speech: Impaired Respiration: Within functional limits Phonation: Normal Resonance: Within functional limits Articulation: Impaired Level of Impairment: Sentence Intelligibility: Intelligible Motor Planning: Witnin functional limits Motor Speech  Errors: Not applicable   [place], [place], [place] Pager: [phone] Office: [phone]  Aliene Altes 03/26/2018, 2:14 PM

## 2018-03-26 NOTE — Progress Notes (Signed)
STROKE TEAM PROGRESS NOTE      SUBJECTIVE (INTERVAL HISTORY) No family members present.  The patient was alert and oriented.  .she feels she is strategies, in the left side today. She has been seen by rehabilitation team and felt to be a good patient for inpatient rehabilitation.   OBJECTIVE Vitals:   03/26/18 0000 03/26/18 0325 03/26/18 0843 03/26/18 1148  BP: (!) 147/63 (!) 143/78 140/72 (!) 155/88  Pulse: 97 80 100 91  Resp:   20 20  Temp: 97.9 F (36.6 C) 97.7 F (36.5 C) 97.7 F (36.5 C) 97.9 F (36.6 C)  TempSrc: Axillary Axillary Oral Oral  SpO2: 96% 99% 100% 95%  Weight:      Height:        CBC:  Recent Labs  Lab 03/24/18 0801  03/25/18 0247 03/26/18 0644  WBC 5.5  --  5.8 5.2  NEUTROABS 3.6  --   --  3.7  HGB 10.9*   < > 11.8* 11.0*  HCT 32.6*   < > 35.6* 33.1*  MCV 96.2  --  97.0 95.1  PLT 197  --  191 187   < > = values in this interval not displayed.    Basic Metabolic Panel:  Recent Labs  Lab 03/25/18 0247 03/26/18 0644  NA 140 140  K 4.7 4.4  CL 107 110  CO2 23 23  GLUCOSE 137* 228*  BUN 31* 31*  CREATININE 1.70* 1.80*  CALCIUM 9.8 9.9    Lipid Panel:     Component Value Date/Time   CHOL 189 03/25/2018 0247   TRIG 578 (H) 03/25/2018 0247   HDL 25 (L) 03/25/2018 0247   CHOLHDL 7.6 03/25/2018 0247   VLDL UNABLE TO CALCULATE IF TRIGLYCERIDE OVER 400 mg/dL 03/25/2018 0247   LDLCALC UNABLE TO CALCULATE IF TRIGLYCERIDE OVER 400 mg/dL 03/25/2018 0247   HgbA1c:  Lab Results  Component Value Date   HGBA1C 8.6 (H) 03/25/2018   Urine Drug Screen: No results found for: LABOPIA, COCAINSCRNUR, LABBENZ, AMPHETMU, THCU, LABBARB  Alcohol Level     Component Value Date/Time   ETH <10 03/24/2018 0801    IMAGING  Mr Brain Wo Contrast 03/24/2018 IMPRESSION:  1. 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  2. Scattered subcortical T2 hyperintensities bilaterally are mildly advanced for age.This likely reflects the sequela of  chronic microvascular ischemia.  3. MRA circle-of-Willis demonstrates mild distal small vessel disease without significant proximal stenosis, aneurysm, or branch vessel occlusion.  4. Degenerative changes of the cervical spine at C4-5.    Mr Virgel Paling (cerebral Arteries) 03/24/2018 IMPRESSION:  1. 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  2. Scattered subcortical T2 hyperintensities bilaterally are mildly advanced for age. This likely reflects the sequela of chronic microvascular ischemia.  3. MRA circle-of-Willis demonstrates mild distal small vessel disease without significant proximal stenosis, aneurysm, or branch vessel occlusion.  4. Degenerative changes of the cervical spine at C4-5.    Ct Head Code Stroke Wo Contrast 03/24/2018 IMPRESSION:  1. No evidence of acute intracranial abnormality.  2. ASPECTS is 10.      Transthoracic Echocardiogram 03/24/2018 Study Conclusions  - Left ventricle: The cavity size was normal. Systolic function was   normal. The estimated ejection fraction was in the range of 60%   to 65%. Wall motion was normal; there were no regional wall   motion abnormalities. Doppler parameters are consistent with   abnormal left ventricular relaxation (grade 1 diastolic  dysfunction). - Aortic valve: There was mild regurgitation. Regurgitation   pressure half-time: 534 ms. - Aorta: Ascending aortic AP diameter: 39 mm. - Ascending aorta: The ascending aorta was mildly dilated. - Mitral valve: There was trivial regurgitation. - Right ventricle: Systolic function was normal. - Atrial septum: No defect or patent foramen ovale was identified. - Tricuspid valve: There was trivial regurgitation. - Pulmonic valve: There was trivial regurgitation. Impressions: - No cardiac source of emboli was indentified.    Bilateral Carotid Dopplers 03/24/2018 Final Interpretation: Right Carotid: Velocities in the right ICA are consistent with a 1-39%  stenosis. Left Carotid: Velocities in the left ICA are consistent with a 1-39% stenosis. Vertebrals: Bilateral vertebral arteries demonstrate antegrade flow. Subclavians: Normal flow hemodynamics were seen in bilateral subclavian       arteries.    PHYSICAL EXAM Blood pressure (!) 155/88, pulse 91, temperature 97.9 F (36.6 C), temperature source Oral, resp. rate 20, height 5\' 7"  (1.702 m), weight 90.6 kg, SpO2 95 %. Pleasant elderly Caucasian lady not in distress. . Afebrile. Head is nontraumatic. Neck is supple without bruit.    Cardiac exam no murmur or gallop. Lungs are clear to auscultation. Distal pulses are well felt.  Neurological Exam :  Awake alert oriented x 3 normal speech and language.  Extraocular movements are full range without nystagmus.  Mild secondary dysmetria on left gaze.  Mild left lower face asymmetry. Tongue midline.  Mild left upper and lower extremity drift. Mild diminished fine finger movements on left. Orbits right over left upper extremity. Mild left grip weak.. Left lower extremity 4/5 strength.  Normal sensation . Normal coordination.  Gait deferred.        ASSESSMENT/PLAN [name] is a 77 y.o. female with history of chronic kidney disease, hypertension, hyperlipidemia, diabetes mellitus, depression, status post cardiac ablation for tachycardia, breast cancer, and anxiety presenting with left-sided weakness, left facial droop, and an unsteady gait. She did not receive IV t-PA due to late presentation.  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   MRA head - not performed  Carotid Doppler - unremarkable  2D Echo - EF 60 - 65%. No cardiac source of emboli identified.   LDL - unable to calculate secondary to high triglycerides (578)  HgbA1c -  8.6  VTE prophylaxis - Lovenox  Diet  - Carb modified with thin liquids  aspirin 81 mg daily prior to admission, now on aspirin 325 mg daily and clopidogrel 75 mg daily  Patient counseled to be compliant with her antithrombotic medications  Ongoing aggressive stroke risk factor management  Therapy recommendations:  CIR  Disposition:  Pending  Hypertension  Stable . Permissive hypertension (OK if < 220/120) but gradually normalize in 5-7 days . Long-term BP goal normotensive  Hyperlipidemia  Lipid lowering medication PTA:  Pravachol 20 mg daily  LDL unable to calculate, goal < 70  Current lipid lowering medication: Lipitor 40 mg daily  Continue statin at discharge  Diabetes  HgbA1c 8.6, goal < 7.0  Uncontrolled  Other Stroke Risk Factors  Advanced age  Obesity, Body mass index is 31.28 kg/m., recommend weight loss, diet and exercise as appropriate    Other Active Problems  Chronic anemia  CKD     Hospital day # 1     She presented with slurred  speech and left-sided weakness secondary to pontine lacunar infarct due to small vessel disease.  Recommend continue ongoing stroke work-up and aggressive risk factor modification.  Dual antiplatelet therapy for 3 weeks followed by plavix alone.  Physical occupational therapy and rehab consults.  Long discussion with the patient  Follow-up as an outpatient in the stroke clinic in 6 weeks. Stroke team will sign off. Kindly call for questions if any. Antony Contras, MD Medical Director [facility] Pager: [phone] 03/26/2018 12:35 PM   To contact Stroke Continuity provider, please refer to [email or web address] After hours, contact General Neurology

## 2018-03-27 DIAGNOSIS — F329 Major depressive disorder, single episode, unspecified: Secondary | ICD-10-CM

## 2018-03-27 LAB — BASIC METABOLIC PANEL
Anion gap: 7 (ref 5–15)
BUN: 28 mg/dL — ABNORMAL HIGH (ref 8–23)
CO2: 24 mmol/L (ref 22–32)
Calcium: 9.8 mg/dL (ref 8.9–10.3)
Chloride: 110 mmol/L (ref 98–111)
Creatinine, Ser: 1.79 mg/dL — ABNORMAL HIGH (ref 0.44–1.00)
GFR calc Af Amer: 30 mL/min — ABNORMAL LOW (ref >60)
GFR calc non Af Amer: 26 mL/min — ABNORMAL LOW (ref >60)
Glucose, Bld: 162 mg/dL — ABNORMAL HIGH (ref 70–99)
Potassium: 4.2 mmol/L (ref 3.5–5.1)
Sodium: 141 mmol/L (ref 135–145)

## 2018-03-27 LAB — GLUCOSE, CAPILLARY
Glucose-Capillary: 175 mg/dL — ABNORMAL HIGH (ref 70–99)
Glucose-Capillary: 172 mg/dL — ABNORMAL HIGH (ref 70–99)
Glucose-Capillary: 189 mg/dL — ABNORMAL HIGH (ref 70–99)
Glucose-Capillary: 201 mg/dL — ABNORMAL HIGH (ref 70–99)

## 2018-03-27 MED ORDER — HEPARIN SODIUM (PORCINE) 5000 UNIT/ML IJ SOLN
5000.0000 [IU] | Freq: Three times a day (TID) | INTRAMUSCULAR | Status: DC
  Administered 2018-03-27 – 2018-03-28 (×4): 5000 [IU] via SUBCUTANEOUS
  Filled 2018-03-27 (×4): qty 1

## 2018-03-27 NOTE — Progress Notes (Signed)
PROGRESS NOTE  Sher Shampine XBJ:[id number] DOB: August 12, 1940 DOA: 03/24/2018 PCP: Crist Infante, MD  HPI/Recap of past 24 hours: 77 y.o.femalewith hx of DM, right breast cancer who presented to [facility] on 8/30 with left sided weakness and facial droop. CT of the brain showed no acute abnormality. 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 Pt has demonstrated ongoing left sided motor deficits.  03/27/2018: Patient seen and examined her bedside.  Left arm weakness and difficulty word finding on physical exam.  She denies any palpitations, dizziness or headaches.  She has no new complaints.  PT recommended CIR.  Assessment/Plan: Active Problems:   TIA (transient ischemic attack)   Acute ischemic stroke (HCC)  Acute nonhemorrhagic right paramedian pontine infarct Twelve-lead EKG done on 03/25/2018 independently reviewed revealed sinus rhythm with rate of 87 2D echo done on 03/24/2018 no PFO identified Continue aspirin 325 and Plavix 75 daily for 3 weeks then Plavix 75 mg daily Follow-up with neurology clinic after discharge in 6 weeks Inpatient rehab consult-pending insurance authorization Continue PT Fall precautions  Hyperlipidemia LDL unable to calculate Goal LDL less than 70 Continue Lipitor 80 mg daily  Uncontrolled type 2 diabetes with hyperglycemia and acute ischemic stroke Last A1c 8.6 on 03/25/2018 Continue insulin Goal A1c less than 7.0  Obesity BMI 31 Weight loss with healthy dieting and regular exercise outpatient  CKD 3 Baseline creatinine 1.7 with GFR of 28 Avoid nephrotoxic agents/dehydration/hypotension Repeat BMP in the morning  Chronic diastolic CHF Last 2D echo done on 03/24/2018 revealed preserved EF with no wall motion abnormalities Daily weight Strict I's and O's  Chronic depression/anxiety Continue Effexor XR and  Remeron     Code Status: Full code  Family Communication: None at bedside  Disposition Plan: Possibly inpatient rehab in 1 to 2 days if insurance approves   Consultants:  Neurology  Procedures:  None  Antimicrobials:  None  DVT prophylaxis: Subcu heparin 3 times daily   Objective: Vitals:   03/27/18 0110 03/27/18 0345 03/27/18 0957 03/27/18 1246  BP: (!) 143/103 (!) 141/81 126/86 133/78  Pulse: 90 80 (!) 101 93  Resp:    16  Temp: (!) 97.5 F (36.4 C) 98 F (36.7 C) 98.4 F (36.9 C) 98.1 F (36.7 C)  TempSrc: Oral Oral Oral Oral  SpO2: 96% 99% 98% 97%  Weight:      Height:        Intake/Output Summary (Last 24 hours) at 03/27/2018 1315 Last data filed at 03/27/2018 1246 Gross per 24 hour  Intake 238 ml  Output -  Net 238 ml   Filed Weights   03/24/18 0818  Weight: 90.6 kg    Exam:  . General: 77 y.o. year-old female well developed well nourished in no acute distress.  Alert and oriented x3.  Mild difficulty with word finding. . Cardiovascular: Regular rate and rhythm with no rubs or gallops.  No thyromegaly or JVD noted.   Marland Kitchen Respiratory: Clear to auscultation with no wheezes or rales. Good inspiratory effort. . Abdomen: Soft nontender nondistended with normal bowel sounds x4 quadrants. . Musculoskeletal: No lower extremity edema. 2/4 pulses in all 4 extremities.  Left upper extremity weakness 3/5 strength. . Skin: No ulcerative lesions noted or rashes, . Psychiatry: Mood is appropriate for condition and setting   Data Reviewed: CBC: Recent Labs  Lab 03/24/18 0801 03/24/18 0814 03/25/18 0247 03/26/18 0644  WBC 5.5  --  5.8 5.2  NEUTROABS 3.6  --   --  3.7  HGB 10.9* 10.9* 11.8* 11.0*  HCT 32.6* 32.0* 35.6* 33.1*  MCV 96.2  --  97.0 95.1  PLT 197  --  191 361   Basic Metabolic Panel: Recent Labs  Lab 03/24/18 0801 03/24/18 0814 03/25/18 0247 03/26/18 0644 03/27/18 0238  NA 137 137 140 140 141  K 4.8 4.8 4.7 4.4 4.2  CL 105 104 107  110 110  CO2 24  --  23 23 24   GLUCOSE 232* 227* 137* 228* 162*  BUN 30* 32* 31* 31* 28*  CREATININE 1.95* 2.00* 1.70* 1.80* 1.79*  CALCIUM 9.9  --  9.8 9.9 9.8   GFR: Estimated Creatinine Clearance: 30.4 mL/min (A) (by C-G formula based on SCr of 1.79 mg/dL (H)). Liver Function Tests: Recent Labs  Lab 03/24/18 0801 03/25/18 0247  AST 43* 36  ALT 42 39  ALKPHOS 84 72  BILITOT 0.5 0.8  PROT 6.1* 5.9*  ALBUMIN 3.4* 3.1*   No results for input(s): LIPASE, AMYLASE in the last 168 hours. No results for input(s): AMMONIA in the last 168 hours. Coagulation Profile: Recent Labs  Lab 03/24/18 0801  INR 1.02   Cardiac Enzymes: Recent Labs  Lab 03/24/18 1154 03/24/18 1935 03/25/18 0247  TROPONINI <0.03 <0.03 <0.03   BNP (last 3 results) No results for input(s): PROBNP in the last 8760 hours. HbA1C: Recent Labs    03/25/18 0247  HGBA1C 8.6*   CBG: Recent Labs  Lab 03/26/18 1142 03/26/18 1635 03/26/18 2206 03/27/18 0623 03/27/18 1103  GLUCAP 223* 166* 124* 175* 172*   Lipid Profile: Recent Labs    03/25/18 0247  CHOL 189  HDL 25*  LDLCALC UNABLE TO CALCULATE IF TRIGLYCERIDE OVER 400 mg/dL  TRIG 578*  CHOLHDL 7.6   Thyroid Function Tests: No results for input(s): TSH, T4TOTAL, FREET4, T3FREE, THYROIDAB in the last 72 hours. Anemia Panel: No results for input(s): VITAMINB12, FOLATE, FERRITIN, TIBC, IRON, RETICCTPCT in the last 72 hours. Urine analysis: No results found for: COLORURINE, APPEARANCEUR, LABSPEC, PHURINE, GLUCOSEU, HGBUR, BILIRUBINUR, KETONESUR, PROTEINUR, UROBILINOGEN, NITRITE, LEUKOCYTESUR Sepsis Labs: @LABRCNTIP (procalcitonin:4,lacticidven:4)  )No results found for this or any previous visit (from the past 240 hour(s)).    Studies: No results found.  Scheduled Meds: . ARIPiprazole  5 mg Oral Daily  . aspirin EC  325 mg Oral Once  . aspirin  325 mg Oral Daily  . atorvastatin  80 mg Oral q1800  . clopidogrel  75 mg Oral Daily  .  insulin aspart  0-9 Units Subcutaneous TID WC  . insulin aspart protamine- aspart  10 Units Subcutaneous BID WC  . LORazepam  0.5 mg Oral QID  . mirtazapine  15 mg Oral QHS  . venlafaxine XR  75 mg Oral QODAY    Continuous Infusions:   LOS: 2 days     Kayleen Memos, MD [facility] Pager [phone]  If 7PM-7AM, please contact night-coverage [email or web address] Password [email or web address] 03/27/2018, 1:15 PM

## 2018-03-27 NOTE — Progress Notes (Signed)
Physical Therapy Treatment Patient Details Name: [name] MRN: [id number] DOB: 07-01-1941 Today's Date: 03/27/2018    History of Present Illness 77 year old female with a history of tachycardia status post ablation in 2007, hypertension, dyslipidemia, type 2 diabetes insulin-dependent, chronic kidney disease stage III, history of breast cancer, history of colon cancer, who presents to the ED this morning with left-sided weakness, left facial droopNIHSS 3 due to left facial droop, left arm drift, and dysarthria. 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     PT Comments    Patient very pleasant - motivated to work with PT today to progress safe mobility. Patient today with difficulty navigating objects on L side with poor attention to L side. Patient continues to be a good candidate for CIR level therapies.    Follow Up Recommendations  CIR     Equipment Recommendations  Other (comment)(defer)    Recommendations for Other Services       Precautions / Restrictions Precautions Precautions: Fall Precaution Comments: ? L inattention Restrictions Weight Bearing Restrictions: No    Mobility  Bed Mobility Overal bed mobility: Needs Assistance Bed Mobility: Supine to Sit     Supine to sit: Mod assist     General bed mobility comments: on BSC upon PT arrival  Transfers Overall transfer level: Needs assistance Equipment used: Rolling walker (2 wheeled) Transfers: Sit to/from Stand Sit to Stand: Min assist         General transfer comment: Min A to power up and for general safety/balance  Ambulation/Gait Ambulation/Gait assistance: Min assist;+2 safety/equipment Gait Distance (Feet): 75 Feet Assistive device: Rolling walker (2 wheeled) Gait Pattern/deviations: Step-to pattern;Step-through pattern;Decreased stride length;Drifts right/left;Staggering right Gait velocity: decreased   General Gait Details: heavy preference to drift R with gait -  seemingly unaware of L side with gait as well as difficulty navigating obstacles on L side   Stairs             Wheelchair Mobility    Modified Rankin (Stroke Patients Only) Modified Rankin (Stroke Patients Only) Pre-Morbid Rankin Score: No significant disability Modified Rankin: Moderately severe disability     Balance Overall balance assessment: Needs assistance Sitting-balance support: No upper extremity supported;Feet supported Sitting balance-Leahy Scale: Fair     Standing balance support: Bilateral upper extremity supported;During functional activity Standing balance-Leahy Scale: Poor Standing balance comment: reliant on external support                            Cognition Arousal/Alertness: Awake/alert Behavior During Therapy: WFL for tasks assessed/performed;Anxious Overall Cognitive Status: Impaired/Different from baseline Area of Impairment: Following commands;Safety/judgement;Problem solving                   Current Attention Level: Selective   Following Commands: Follows one step commands consistently;Follows one step commands with increased time Safety/Judgement: Decreased awareness of safety Awareness: Emergent Problem Solving: Slow processing;Difficulty sequencing;Requires verbal cues;Requires tactile cues        Exercises Other Exercises Other Exercises: squeeze ball Other Exercises: attentional tasks    General Comments        Pertinent Vitals/Pain Pain Assessment: No/denies pain Pain Score: 0-No pain Faces Pain Scale: Hurts a little bit Pain Location: back Pain Descriptors / Indicators: Discomfort Pain Intervention(s): Monitored during session    Home Living                      Prior Function  PT Goals (current goals can now be found in the care plan section) Acute Rehab PT Goals Patient Stated Goal: to return to being independent PT Goal Formulation: With patient/family Time For Goal  Achievement: 04/07/18 Potential to Achieve Goals: Good Progress towards PT goals: Progressing toward goals    Frequency    Min 4X/week      PT Plan Current plan remains appropriate    Co-evaluation              AM-PAC PT "6 Clicks" Daily Activity  Outcome Measure  Difficulty turning over in bed (including adjusting bedclothes, sheets and blankets)?: A Little Difficulty moving from lying on back to sitting on the side of the bed? : A Lot Difficulty sitting down on and standing up from a chair with arms (e.g., wheelchair, bedside commode, etc,.)?: Unable Help needed moving to and from a bed to chair (including a wheelchair)?: A Little Help needed walking in hospital room?: A Little Help needed climbing 3-5 steps with a railing? : A Lot 6 Click Score: 14    End of Session Equipment Utilized During Treatment: Gait belt Activity Tolerance: Patient tolerated treatment well Patient left: in chair;with call bell/phone within reach;with chair alarm set Nurse Communication: Mobility status PT Visit Diagnosis: Unsteadiness on feet (R26.81);Other abnormalities of gait and mobility (R26.89);Difficulty in walking, not elsewhere classified (R26.2);Other symptoms and signs involving the nervous system (R29.898)     Time: 1005-1019 PT Time Calculation (min) (ACUTE ONLY): 14 min  Charges:  $Gait Training: 8-22 mins                     Lanney Gins, PT, DPT 03/27/18 1:35 PM Pager: [phone]

## 2018-03-27 NOTE — PMR Pre-admission (Signed)
PMR Admission Coordinator Pre-Admission Assessment  Patient: [name] is an 77 y.o., female MRN: [id number] DOB: Sep 22, 1940 Height: _0  (170.2 cm) Weight: 90.6 kg              Insurance Information HMO:     PPO: Yes     PCP:      IPA:      80/20:      OTHER:  PRIMARY: U[employer] Medicare      Policy#: [id number]      Subscriber: Patient CM Name: [name]      Phone#: [phone]     Fax#: [phone] Approval received from [place] at [facility] for admit date 03/28/18. Clinical updates are due on day 7 (04/04/18) to [name].  Pre-Cert#: [id number]      Employer:  Benefits:  Phone #: NA     Name: [email or web address] Eff. Date: 07/26/17     Deduct: $0       Out of Pocket Max: $2,000 (met $90.00)      Life Max: NA CIR: 100%      SNF: $0/day for unlimited days, 100% Outpatient: 100% per necessity     Co-Pay:  Home Health: 100% per necessity      Co-Pay:  DME: 100%     Co-Pay:  Providers:  SECONDARY:       Policy#:       Subscriber:  CM Name:       Phone#:      Fax#:  Pre-Cert#:       Employer:  Benefits:  Phone #:      Name:  Eff. Date:      Deduct:      Out of Pocket Max:      Life Max:  CIR:       SNF:  Outpatient:     Co-Pay:  Home Health:       Co-Pay:  DME:      Co-Pay:   Medicaid Application Date:       Case Manager: Disability Application Date:       Case Worker:   Emergency Contact Information Contact Information    Name Relation Home Work Mobile   [place] Son [phone]       Current Medical History  Patient Admitting Diagnosis: right pontine infarct History of Present Illness: [name] is a 77 year old right-handed female with history of tachycardia status post ablation 2007, diabetes mellitus, right breast cancer status post lumpectomy as well as colon cancer, CKD stage III.  Per chart review patient lives alone was independent prior to admission and driving.  Family does provide Doctor, hospital.  One level town home.  Son is a Field seismologist.  Presented  03/24/2018 with left-sided weakness and facial droop as well as slurred speech.  Cranial CT scan negative for acute changes.  Patient did receive TPA.  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  No significant proximal stenosis aneurysm or branch vessel occlusion.  Echocardiogram with ejection fraction of 00% grade 1 diastolic dysfunction.  Carotid Dopplers with no ICA stenosis.  Neurology consulted presently on aspirin and Plavix for CVA prophylaxis.  Subcutaneous heparin for DVT prophylaxis.  Patient is tolerating a regular diet.  Physical and occupational therapy evaluations completed with recommendations of physical medicine rehab consult.  Patient was admitted for a comprehensive rehab program  Complete NIHSS TOTAL: 4    Past Medical History  Past Medical History:  Diagnosis Date  . Anxiety   .  Back ache   . Breast cancer ([place])    right  . Bronchitis   . Cancer of colon ([place])   . Depression   . DM (diabetes mellitus) ([place])   . Dyspnea on exertion   . Fall on steps    age 67 feel down iron stairs couldnot move leg for 6 mths  . Family history of primary TB   . Fracture of ankle, closed 2014   left  . History of chicken pox   . History of primary TB   . Hyperlipidemia   . Hypertension   . Kidney disease, chronic, stage III (moderate, EGFR 30-59 ml/min) ([place]) 2017  . Kidney disorder    reduction function  . Kidney stones   . Knee pain, right   . Lung abnormality    age 83 patient had pna spot on lungs   . Microalbuminuria   . Muscle cramps    both legs  . Muscular degeneration    of right eye  . Nephrolithiasis   . Palpitations   . Personal history of radiation therapy     Family History  family history includes Asthma (age of onset: 42) in her brother; Cancer in her father; Kidney Stones in her brother; Peripheral Artery Disease (age of onset: 2) in her mother.  Prior Rehab/Hospitalizations:  Has the patient had major  surgery during 100 days prior to admission? No  Current Medications   Current Facility-Administered Medications:  .  acetaminophen (TYLENOL) tablet 650 mg, 650 mg, Oral, Q4H PRN, 650 mg at 03/27/18 2145 **OR** acetaminophen (TYLENOL) solution 650 mg, 650 mg, Per Tube, Q4H PRN **OR** acetaminophen (TYLENOL) suppository 650 mg, 650 mg, Rectal, Q4H PRN, Abrol, Nayana, MD .  ARIPiprazole (ABILIFY) tablet 5 mg, 5 mg, Oral, Daily, Abrol, Nayana, MD, 5 mg at 03/28/18 0859 .  aspirin EC tablet 325 mg, 325 mg, Oral, Once, Pfeiffer, Marcy, MD .  aspirin tablet 325 mg, 325 mg, Oral, Daily, Abrol, Nayana, MD, 325 mg at 03/28/18 0858 .  atorvastatin (LIPITOR) tablet 80 mg, 80 mg, Oral, q1800, Cristal Deer, MD, 80 mg at 03/27/18 1728 .  bisacodyl (DULCOLAX) suppository 10 mg, 10 mg, Rectal, Daily PRN, Georgette Shell, MD .  clopidogrel (PLAVIX) tablet 75 mg, 75 mg, Oral, Daily, Abrol, Nayana, MD, 75 mg at 03/28/18 0858 .  heparin injection 5,000 Units, 5,000 Units, Subcutaneous, Q8H, Hall, Carole N, DO, 5,000 Units at 03/28/18 1354 .  insulin aspart (novoLOG) injection 0-9 Units, 0-9 Units, Subcutaneous, TID WC, Bodenheimer, Charles A, NP, 2 Units at 03/28/18 1219 .  insulin aspart protamine- aspart (NOVOLOG MIX 70/30) injection 10 Units, 10 Units, Subcutaneous, BID WC, Abrol, Nayana, MD, 10 Units at 03/28/18 0900 .  LORazepam (ATIVAN) tablet 0.5 mg, 0.5 mg, Oral, QID, Abrol, Nayana, MD, 0.5 mg at 03/28/18 1218 .  mirtazapine (REMERON) tablet 15 mg, 15 mg, Oral, QHS, Abrol, Nayana, MD, 15 mg at 03/27/18 2145 .  senna-docusate (Senokot-S) tablet 1 tablet, 1 tablet, Oral, QHS PRN, Reyne Dumas, MD, 1 tablet at 03/27/18 2146 .  venlafaxine XR (EFFEXOR-XR) 24 hr capsule 75 mg, 75 mg, Oral, QODAY, Abrol, Nayana, MD, 75 mg at 03/28/18 0859  Patients Current Diet:  Diet Order            Diet Carb Modified Fluid consistency: Thin; Room service appropriate? Yes  Diet effective now               Precautions / Restrictions Precautions Precautions: Fall Precaution Comments: ?  L inattention Restrictions Weight Bearing Restrictions: No   Has the patient had 2 or more falls or a fall with injury in the past year?No  Prior Activity Level Limited Community (1-2x/wk): approv 4x/week  Home Assistive Devices / Equipment Home Assistive Devices/Equipment: None Home Equipment: None  Prior Device Use: Indicate devices/aids used by the patient prior to current illness, exacerbation or injury? None of the above  Prior Functional Level Prior Function Level of Independence: Independent Comments: drove; medication management; family does financial mangement  Self Care: Did the patient need help bathing, dressing, using the toilet or eating?  Independent  Indoor Mobility: Did the patient need assistance with walking from room to room (with or without device)? Independent  Stairs: Did the patient need assistance with internal or external stairs (with or without device)? Independent  Functional Cognition: Did the patient need help planning regular tasks such as shopping or remembering to take medications? Independent  Current Functional Level Cognition  Arousal/Alertness: Awake/alert Overall Cognitive Status: Impaired/Different from baseline Current Attention Level: Selective Orientation Level: Oriented X4 Following Commands: Follows one step commands consistently, Follows one step commands with increased time Safety/Judgement: Decreased awareness of safety, Decreased awareness of deficits Attention: Selective, Alternating Selective Attention: Appears intact Alternating Attention: Impaired Memory: Impaired Memory Impairment: Storage deficit, Decreased recall of new information(delayed recall 3/5) Awareness: Impaired Awareness Impairment: Emergent impairment("that's hard for me" "I guess my thinking is different.") Problem Solving: Impaired Problem Solving Impairment: Verbal  basic(simple time 3/3, money 1/3 extended time, slow processing) Executive Function: Reasoning Reasoning: Impaired Reasoning Impairment: Verbal basic(abstraction, category naming impaired ) Safety/Judgment: Impaired    Extremity Assessment (includes Sensation/Coordination)  Upper Extremity Assessment: LUE deficits/detail LUE Deficits / Details: continues to dmeonstratea apparent LUE sensorimotor deficits; able to use LUE to turn on water and assist with ADL; requires cues to use at times; poor in hand manipulation skills and general weakness throught LUE LUE Sensation: decreased light touch LUE Coordination: decreased fine motor, decreased gross motor  Lower Extremity Assessment: Defer to PT evaluation RLE Deficits / Details: 5/5 LLE Deficits / Details: grossly 5/5 except hip flexion 4/5 LLE Coordination: WNL    ADLs  Overall ADL's : Needs assistance/impaired Eating/Feeding: Set up Grooming: Set up, Supervision/safety, Sitting Upper Body Bathing: Supervision/ safety, Set up, Sitting Lower Body Bathing: Moderate assistance, Sit to/from stand Upper Body Dressing : Minimal assistance, Sitting Lower Body Dressing: Moderate assistance, Sit to/from stand Toilet Transfer: Moderate assistance, RW, BSC Toileting- Clothing Manipulation and Hygiene: Moderate assistance Functional mobility during ADLs: Moderate assistance, Rolling walker, Cueing for safety, Cueing for sequencing General ADL Comments: Poor orientation in space during gfunctional tasks; R bias; appears to dmeonstrate L inatteniton; Head prefernece toward L; left water running and required cues to turn off; difficulty with problem solving how to turn to sit in recliner; difficulty with maintaining spatial awareness to allow therapist room on L    Mobility  Overal bed mobility: Needs Assistance Bed Mobility: Supine to Sit Supine to sit: Mod assist Sit to supine: Mod assist General bed mobility comments: seated EOB upon PT arrival     Transfers  Overall transfer level: Needs assistance Equipment used: Rolling walker (2 wheeled) Transfers: Sit to/from Stand Sit to Stand: Min assist Stand pivot transfers: Mod assist(appaent coordination deficits) General transfer comment: Min A to power up from bedside - verbal cueing to push from bed rather than pull at RW. unsteradiness requiring Min A upon initial standing    Ambulation / Gait / Stairs / Wheelchair  Mobility  Ambulation/Gait Ambulation/Gait assistance: Min assist Gait Distance (Feet): 75 Feet Assistive device: Rolling walker (2 wheeled) Gait Pattern/deviations: Step-through pattern, Decreased stride length, Staggering right, Drifts right/left, Trunk flexed General Gait Details: downward gaze preference with verbal cueing to improve posturing with limited carryover. Continues to drift to R side with patient unaware of this requiring cueing. verbal cueing to navigate obstacles Gait velocity: decreased Gait velocity interpretation: <1.31 ft/sec, indicative of household ambulator    Posture / Balance Dynamic Sitting Balance Sitting balance - Comments: L bias initially, and posterior lean with no foot support during muscle testing Balance Overall balance assessment: Needs assistance Sitting-balance support: No upper extremity supported, Feet supported Sitting balance-Leahy Scale: Fair Sitting balance - Comments: L bias initially, and posterior lean with no foot support during muscle testing Standing balance support: Bilateral upper extremity supported, During functional activity Standing balance-Leahy Scale: Poor Standing balance comment: reliant on external support    Special needs/care consideration BiPAP/CPAP: no  CPM: no Continuous Drip IV Dialysis: no        Days: no Life Vest: no Oxygen: no Special Bed: no Trach Size: no Wound Vac (area): no      Location: no Skin: no areas of concern                     Bowel mgmt:Last BM 03/24/18 (reports feeling  stopped up) Bladder mgmt: continent; BSC Diabetic mgmt yes     Previous Home Environment Living Arrangements: Alone  Lives With: Alone Available Help at Discharge: Family Type of Home: (townhome) Home Layout: One level Home Access: (town home) Bathroom Shower/Tub: Public librarian, Architectural technologist: Standard Bathroom Accessibility: Yes How Accessible: Accessible via walker Home Care Services: No  Discharge Living Setting Plans for Discharge Living Setting: Patient's home, Alone Type of Home at Discharge: House Discharge Home Layout: One level Discharge Home Access: Stairs to enter Entrance Stairs-Rails: None Entrance Stairs-Number of Steps: 3 Discharge Bathroom Shower/Tub: Tub/shower unit Discharge Bathroom Toilet: Standard Discharge Bathroom Accessibility: No Does the patient have any problems obtaining your medications?: No  Social/Family/Support Systems Patient Roles: Other (Comment)(is mother to son; grandmother) Sport and exercise psychologist Information: son Elta [place]) [phone] Anticipated Caregiver: family and friends Anticipated Caregiver's Contact Information: Anticipate pt to be Mod I at DC.  Ability/Limitations of Caregiver: friends could provide intermittant supervision as needed Caregiver Availability: Intermittent Discharge Plan Discussed with Primary Caregiver: No(in front of son (per son and pt, her friends live nearby)) Is Caregiver In Agreement with Plan?: Yes Does Caregiver/Family have Issues with Lodging/Transportation while Pt is in Rehab?: No   Goals/Additional Needs Patient/Family Goal for Rehab: PT/OT/SLP: Mod I Expected length of stay: 13-19 days Cultural Considerations: NA Dietary Needs: carb modified; thin liquids; calorie level  Equipment Needs: TBD Pt/Family Agrees to Admission and willing to participate: Yes Program Orientation Provided & Reviewed with Pt/Caregiver Including Roles  & Responsibilities: Yes(reviewed with pt and son Elta [place]))  Barriers to  Discharge: Home environment access/layout, Decreased caregiver support  Barriers to Discharge Comments: 3 steps to enter; no handrails; tub shower   Decrease burden of Care through IP rehab admission: NA   Possible need for SNF placement upon discharge:not anticipated; pt has Mod I goals with good prognosis for further progress.    Patient Condition: This patient's medical and functional status has changed since the consult dated: 03/25/18 in which the Rehabilitation Physician determined and documented that the patient's condition is appropriate for intensive rehabilitative care in an inpatient rehabilitation facility. See "History of  Present Illness" (above) for medical update. Functional changes are: progression from Mod A for sit to stand and Mod A x2 for 10 feet to Min A sit to stand transfers and Min A 75 feet for ambulation . Patient's medical and functional status update has been discussed with the Rehabilitation physician and patient remains appropriate for inpatient rehabilitation. Will admit to inpatient rehab today.  Preadmission Screen Completed By:  Jhonnie Garner, 03/28/2018 2:55 PM ______________________________________________________________________   Discussed status with Dr. Posey Pronto on 03/28/18 at 2:54PM and received telephone approval for admission today.  Admission Coordinator:  Jhonnie Garner, time 2:54PM/Date 03/28/18

## 2018-03-27 NOTE — Progress Notes (Signed)
  Speech Language Pathology Treatment: Cognitive-Linquistic  Patient Details Name: [name] MRN: [id number] DOB: Sep 26, 1940 Today's Date: 03/27/2018 Time: 7829-5621 SLP Time Calculation (min) (ACUTE ONLY): 18 min  Assessment / Plan / Recommendation Clinical Impression  Skilled treatment session focused on cognition goals. SLP received pt upright in recliner self-feeding regular lunch tray with thin liquids. Pt immediately stated that she is having difficulty chewing the food. Pt observed with regular lunch tray. Pt states that she has upper and lower dental partials (present at bedside) but she doesn't wear them as they feel loose. Upon observation, pt is managing the regular meat textures well with no overt s/s of dysphagia. After further conversation, pt states that she doesn't want diet changed etc its just feels alittle "different" when she chews. SLP further facilitated session by providing Min A for intellectual awareness of physical deficits and Max A for cognitive deficits. Pt provided timely response to all questions and tasks. She is able to recall recommend of CIR and states that she isn't "safe independently at home right now." Pt's speech was > 95% intelligible at the simple conversation level in noisy environment.     HPI HPI: [name] is a 77 y.o. female with history of chronic kidney disease, hypertension, hyperlipidemia, diabetes mellitus, depression, status post cardiac ablation for tachycardia, breast cancer, and anxiety presenting with left-sided weakness, left facial droop, and an unsteady gait. She did not receive IV t-PA due to late presentation. 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       SLP Plan  Continue with current plan of care       Recommendations  Diet recommendations: Regular;Thin liquid Liquids provided via: Cup                General recommendations: Rehab consult Oral Care Recommendations: Oral  care BID Follow up Recommendations: Inpatient Rehab SLP Visit Diagnosis: Cognitive communication deficit (R41.841);Dysarthria and anarthria (R47.1) Plan: Continue with current plan of care       [place] 03/27/2018, 12:31 PM

## 2018-03-27 NOTE — Progress Notes (Signed)
Occupational Therapy Treatment Patient Details Name: [name] MRN: [id number] DOB: [birth year] Today's Date: 03/27/2018    History of present illness 77 year old female with a history of tachycardia status post ablation in 2007, hypertension, dyslipidemia, type 2 diabetes insulin-dependent, chronic kidney disease stage III, history of breast cancer, history of colon cancer, who presents to the ED this morning with left-sided weakness, left facial droopNIHSS 3 due to left facial droop, left arm drift, and dysarthria. 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    OT comments  Pt demonstrates apparent sensorimotor deficits in addition to demonstrating L inattention during functional tasks. Difficulty with spatial orientation and motor planning during functional mobility, especially when attention in challenged. Note motor impersistance LLE during standing ADL. Continue to feel pt is an excellent CIR candidate. Pt is very motivate to return to being more independent. Will continue to follow acutely.   Follow Up Recommendations  CIR;Supervision/Assistance - 24 hour    Equipment Recommendations  3 in 1 bedside commode;Tub/shower bench    Recommendations for Other Services Rehab consult    Precautions / Restrictions Precautions Precautions: Fall Precaution Comments: ? L inattention       Mobility Bed Mobility Overal bed mobility: Needs Assistance Bed Mobility: Supine to Sit     Supine to sit: Mod assist     General bed mobility comments: facilitation behind shoulder and hips to roll; improved tranisition to sitting  Transfers Overall transfer level: Needs assistance Equipment used: Rolling walker (2 wheeled) Transfers: Sit to/from [facility] Sit to Stand: Mod assist;Min assist(tactile facilitation to maintain anterior weight shift )         General transfer comment: facilitation to maintain anterior weight shift during transitional  movements    Balance Overall balance assessment: Needs assistance   Sitting balance-Leahy Scale: Fair       Standing balance-Leahy Scale: Poor Standing balance comment: reliant on external support                           ADL either performed or assessed with clinical judgement   ADL Overall ADL's : Needs assistance/impaired Eating/Feeding: Set up   Grooming: Set up;Supervision/safety;Sitting   Upper Body Bathing: Supervision/ safety;Set up;Sitting   Lower Body Bathing: Moderate assistance;Sit to/from stand   Upper Body Dressing : Minimal assistance;Sitting   Lower Body Dressing: Moderate assistance;Sit to/from stand   Toilet Transfer: Moderate assistance;RW;BSC   Toileting- Clothing Manipulation and Hygiene: Moderate assistance       Functional mobility during ADLs: Moderate assistance;Rolling walker;Cueing for safety;Cueing for sequencing General ADL Comments: Poor orientation in space during gfunctional tasks; R bias; appears to dmeonstrate L inatteniton; Head prefernece toward L; left water running and required cues to turn off; difficulty with problem solving how to turn to sit in recliner; difficulty with maintaining spatial awareness to allow therapist room on L     Vision   Additional Comments: Pt wearing glasses for reading; will further assess; decreased visual attention to L   Perception     Praxis      Cognition Arousal/Alertness: Awake/alert Behavior During Therapy: Anxious Overall Cognitive Status: Impaired/Different from baseline Area of Impairment: Attention;Safety/judgement;Awareness;Problem solving                   Current Attention Level: Selective     Safety/Judgement: Decreased awareness of safety Awareness: Emergent Problem Solving: Slow processing;Difficulty sequencing;Requires verbal cues;Requires tactile cues  Exercises Other Exercises Other Exercises: squeeze ball Other Exercises: attentional  tasks   Shoulder Instructions       General Comments      Pertinent Vitals/ Pain       Pain Assessment: Faces Faces Pain Scale: Hurts a little bit Pain Location: back Pain Descriptors / Indicators: Discomfort Pain Intervention(s): Limited activity within patient's tolerance  Home Living                                          Prior Functioning/Environment              Frequency  Min 3X/week        Progress Toward Goals  OT Goals(current goals can now be found in the care plan section)  Progress towards OT goals: Progressing toward goals  Acute Rehab OT Goals Patient Stated Goal: to return to being independent OT Goal Formulation: With patient/family Time For Goal Achievement: 04/07/18 Potential to Achieve Goals: Good ADL Goals Pt Will Perform Grooming: with modified independence;standing Pt Will Perform Upper Body Bathing: with modified independence;sitting Pt Will Perform Lower Body Bathing: with modified independence;sit to/from stand Pt Will Perform Upper Body Dressing: with modified independence;sitting Pt Will Perform Lower Body Dressing: with modified independence;sit to/from stand Pt Will Transfer to Toilet: with modified independence;ambulating;bedside commode Pt Will Perform Toileting - Clothing Manipulation and hygiene: with modified independence;sit to/from stand  Plan Discharge plan remains appropriate;Frequency needs to be updated    Co-evaluation                 AM-PAC PT "6 Clicks" Daily Activity     Outcome Measure   Help from another person eating meals?: A Little Help from another person taking care of personal grooming?: A Little Help from another person toileting, which includes using toliet, bedpan, or urinal?: A Little Help from another person bathing (including washing, rinsing, drying)?: A Lot Help from another person to put on and taking off regular upper body clothing?: A Little Help from another person  to put on and taking off regular lower body clothing?: A Lot 6 Click Score: 16    End of Session Equipment Utilized During Treatment: Gait belt;Rolling walker  OT Visit Diagnosis: Unsteadiness on feet (R26.81);Other abnormalities of gait and mobility (R26.89);Muscle weakness (generalized) (M62.81);Ataxia, unspecified (R27.0);Other symptoms and signs involving cognitive function   Activity Tolerance Patient tolerated treatment well   Patient Left in chair;with call bell/phone within reach   Nurse Communication Mobility status        Time: 1607-3710 OT Time Calculation (min): 43 min  Charges: OT General Charges $OT Visit: 1 Visit OT Treatments $Self Care/Home Management : 38-52 mins  Maurie Boettcher, OT/L  OT Clinical Specialist [phone]    [facility] 03/27/2018, 10:29 AM

## 2018-03-27 NOTE — Progress Notes (Signed)
Inpatient Rehabilitation-Admissions Coordinator    Met with patient and her son at the bedside to discuss team's recommendation for inpatient rehabilitation. Shared booklets, expectations while in CIR, expected length of stay, and anticipated functional level at DC.   Pt would like to pursue CIR at this time. AC will begin insurance authorization process for possible admit to CIR.   Please call if questions.   Jhonnie Garner, OTR/L  Rehab Admissions Coordinator  [phone] 03/27/2018 5:36 PM

## 2018-03-28 ENCOUNTER — Inpatient Hospital Stay (HOSPITAL_COMMUNITY)
Admission: RE | Admit: 2018-03-28 | Discharge: 2018-04-12 | DRG: 057 | Disposition: A | Discharge status: Home Health | Payer: Medicare Other | Source: Intra-hospital | Attending: Physical Medicine & Rehabilitation | Admitting: Physical Medicine & Rehabilitation

## 2018-03-28 DIAGNOSIS — I739 Peripheral vascular disease, unspecified: Secondary | ICD-10-CM | POA: Diagnosis present

## 2018-03-28 DIAGNOSIS — I69328 Other speech and language deficits following cerebral infarction: Secondary | ICD-10-CM | POA: Diagnosis not present

## 2018-03-28 DIAGNOSIS — I69392 Facial weakness following cerebral infarction: Secondary | ICD-10-CM | POA: Diagnosis not present

## 2018-03-28 DIAGNOSIS — E1169 Type 2 diabetes mellitus with other specified complication: Secondary | ICD-10-CM

## 2018-03-28 DIAGNOSIS — Z853 Personal history of malignant neoplasm of breast: Secondary | ICD-10-CM | POA: Diagnosis not present

## 2018-03-28 DIAGNOSIS — F4321 Adjustment disorder with depressed mood: Secondary | ICD-10-CM | POA: Diagnosis not present

## 2018-03-28 DIAGNOSIS — E785 Hyperlipidemia, unspecified: Secondary | ICD-10-CM | POA: Diagnosis present

## 2018-03-28 DIAGNOSIS — E1151 Type 2 diabetes mellitus with diabetic peripheral angiopathy without gangrene: Secondary | ICD-10-CM | POA: Diagnosis present

## 2018-03-28 DIAGNOSIS — Z9841 Cataract extraction status, right eye: Secondary | ICD-10-CM | POA: Diagnosis not present

## 2018-03-28 DIAGNOSIS — Z85038 Personal history of other malignant neoplasm of large intestine: Secondary | ICD-10-CM | POA: Diagnosis not present

## 2018-03-28 DIAGNOSIS — N183 Chronic kidney disease, stage 3 unspecified: Secondary | ICD-10-CM

## 2018-03-28 DIAGNOSIS — Z8249 Family history of ischemic heart disease and other diseases of the circulatory system: Secondary | ICD-10-CM

## 2018-03-28 DIAGNOSIS — K5900 Constipation, unspecified: Secondary | ICD-10-CM | POA: Diagnosis present

## 2018-03-28 DIAGNOSIS — Z79899 Other long term (current) drug therapy: Secondary | ICD-10-CM | POA: Diagnosis not present

## 2018-03-28 DIAGNOSIS — R030 Elevated blood-pressure reading, without diagnosis of hypertension: Secondary | ICD-10-CM | POA: Diagnosis not present

## 2018-03-28 DIAGNOSIS — I639 Cerebral infarction, unspecified: Secondary | ICD-10-CM | POA: Diagnosis not present

## 2018-03-28 DIAGNOSIS — Z794 Long term (current) use of insulin: Secondary | ICD-10-CM

## 2018-03-28 DIAGNOSIS — I69354 Hemiplegia and hemiparesis following cerebral infarction affecting left non-dominant side: Principal | ICD-10-CM

## 2018-03-28 DIAGNOSIS — Z923 Personal history of irradiation: Secondary | ICD-10-CM | POA: Diagnosis not present

## 2018-03-28 DIAGNOSIS — F329 Major depressive disorder, single episode, unspecified: Secondary | ICD-10-CM | POA: Diagnosis present

## 2018-03-28 DIAGNOSIS — E1122 Type 2 diabetes mellitus with diabetic chronic kidney disease: Secondary | ICD-10-CM | POA: Diagnosis present

## 2018-03-28 DIAGNOSIS — R0989 Other specified symptoms and signs involving the circulatory and respiratory systems: Secondary | ICD-10-CM

## 2018-03-28 DIAGNOSIS — I63211 Cerebral infarction due to unspecified occlusion or stenosis of right vertebral arteries: Secondary | ICD-10-CM | POA: Diagnosis not present

## 2018-03-28 DIAGNOSIS — E669 Obesity, unspecified: Secondary | ICD-10-CM | POA: Diagnosis not present

## 2018-03-28 DIAGNOSIS — I129 Hypertensive chronic kidney disease with stage 1 through stage 4 chronic kidney disease, or unspecified chronic kidney disease: Secondary | ICD-10-CM | POA: Diagnosis present

## 2018-03-28 DIAGNOSIS — F411 Generalized anxiety disorder: Secondary | ICD-10-CM

## 2018-03-28 DIAGNOSIS — F419 Anxiety disorder, unspecified: Secondary | ICD-10-CM | POA: Diagnosis present

## 2018-03-28 DIAGNOSIS — Z7982 Long term (current) use of aspirin: Secondary | ICD-10-CM

## 2018-03-28 DIAGNOSIS — E119 Type 2 diabetes mellitus without complications: Secondary | ICD-10-CM

## 2018-03-28 LAB — CBC
HCT: 36.1 % (ref 36.0–46.0)
Hemoglobin: 11.7 g/dL — ABNORMAL LOW (ref 12.0–15.0)
MCH: 31.5 pg (ref 26.0–34.0)
MCHC: 32.4 g/dL (ref 30.0–36.0)
MCV: 97 fL (ref 78.0–100.0)
Platelets: 178 10*3/uL (ref 150–400)
RBC: 3.72 MIL/uL — ABNORMAL LOW (ref 3.87–5.11)
RDW: 13.1 % (ref 11.5–15.5)
WBC: 6 10*3/uL (ref 4.0–10.5)

## 2018-03-28 LAB — GLUCOSE, CAPILLARY
Glucose-Capillary: 193 mg/dL — ABNORMAL HIGH (ref 70–99)
Glucose-Capillary: 186 mg/dL — ABNORMAL HIGH (ref 70–99)
Glucose-Capillary: 213 mg/dL — ABNORMAL HIGH (ref 70–99)
Glucose-Capillary: 188 mg/dL — ABNORMAL HIGH (ref 70–99)
Glucose-Capillary: 223 mg/dL — ABNORMAL HIGH (ref 70–99)

## 2018-03-28 LAB — BASIC METABOLIC PANEL
Anion gap: 8 (ref 5–15)
BUN: 34 mg/dL — ABNORMAL HIGH (ref 8–23)
CO2: 25 mmol/L (ref 22–32)
Calcium: 10 mg/dL (ref 8.9–10.3)
Chloride: 107 mmol/L (ref 98–111)
Creatinine, Ser: 1.99 mg/dL — ABNORMAL HIGH (ref 0.44–1.00)
GFR calc Af Amer: 27 mL/min — ABNORMAL LOW (ref >60)
GFR calc non Af Amer: 23 mL/min — ABNORMAL LOW (ref >60)
Glucose, Bld: 186 mg/dL — ABNORMAL HIGH (ref 70–99)
Potassium: 4.4 mmol/L (ref 3.5–5.1)
Sodium: 140 mmol/L (ref 135–145)

## 2018-03-28 MED ORDER — INSULIN ASPART PROT & ASPART (70-30 MIX) 100 UNIT/ML ~~LOC~~ SUSP
10.0000 [IU] | Freq: Two times a day (BID) | SUBCUTANEOUS | Status: DC
  Administered 2018-03-28 – 2018-04-02 (×11): 10 [IU] via SUBCUTANEOUS
  Filled 2018-03-28: qty 10

## 2018-03-28 MED ORDER — ATORVASTATIN CALCIUM 80 MG PO TABS
80.0000 mg | ORAL_TABLET | Freq: Every day | ORAL | Status: DC
  Administered 2018-03-28 – 2018-04-11 (×15): 80 mg via ORAL
  Filled 2018-03-28 (×16): qty 1

## 2018-03-28 MED ORDER — INSULIN ASPART 100 UNIT/ML ~~LOC~~ SOLN
0.0000 [IU] | Freq: Three times a day (TID) | SUBCUTANEOUS | Status: DC
  Administered 2018-03-28 – 2018-03-29 (×2): 2 [IU] via SUBCUTANEOUS
  Administered 2018-03-29: 3 [IU] via SUBCUTANEOUS
  Administered 2018-03-29 – 2018-03-31 (×7): 2 [IU] via SUBCUTANEOUS
  Administered 2018-04-01: 3 [IU] via SUBCUTANEOUS
  Administered 2018-04-01: 2 [IU] via SUBCUTANEOUS
  Administered 2018-04-01 – 2018-04-02 (×2): 3 [IU] via SUBCUTANEOUS
  Administered 2018-04-02: 2 [IU] via SUBCUTANEOUS
  Administered 2018-04-02 – 2018-04-03 (×2): 3 [IU] via SUBCUTANEOUS
  Administered 2018-04-03 (×2): 2 [IU] via SUBCUTANEOUS
  Administered 2018-04-04: 3 [IU] via SUBCUTANEOUS
  Administered 2018-04-04 – 2018-04-05 (×3): 2 [IU] via SUBCUTANEOUS
  Administered 2018-04-05: 3 [IU] via SUBCUTANEOUS
  Administered 2018-04-05 – 2018-04-06 (×3): 2 [IU] via SUBCUTANEOUS
  Administered 2018-04-06: 3 [IU] via SUBCUTANEOUS
  Administered 2018-04-07: 2 [IU] via SUBCUTANEOUS
  Administered 2018-04-07: 1 [IU] via SUBCUTANEOUS
  Administered 2018-04-07 – 2018-04-08 (×3): 2 [IU] via SUBCUTANEOUS
  Administered 2018-04-08: 1 [IU] via SUBCUTANEOUS
  Administered 2018-04-09: 3 [IU] via SUBCUTANEOUS
  Administered 2018-04-09 – 2018-04-10 (×3): 1 [IU] via SUBCUTANEOUS
  Administered 2018-04-10 – 2018-04-12 (×6): 2 [IU] via SUBCUTANEOUS

## 2018-03-28 MED ORDER — ASPIRIN 325 MG PO TBEC: 325.0000 mg | DELAYED_RELEASE_TABLET | Freq: Once | ORAL | 0 refills | Status: DC

## 2018-03-28 MED ORDER — HEPARIN SODIUM (PORCINE) 5000 UNIT/ML IJ SOLN
5000.0000 [IU] | Freq: Three times a day (TID) | INTRAMUSCULAR | Status: DC
  Administered 2018-03-28 – 2018-04-11 (×41): 5000 [IU] via SUBCUTANEOUS
  Filled 2018-03-28 (×41): qty 1

## 2018-03-28 MED ORDER — HEPARIN SODIUM (PORCINE) 5000 UNIT/ML IJ SOLN: 5000.0000 [IU] | Freq: Three times a day (TID) | INTRAMUSCULAR | Status: DC

## 2018-03-28 MED ORDER — ACETAMINOPHEN 325 MG PO TABS
650.0000 mg | ORAL_TABLET | ORAL | Status: DC | PRN
  Administered 2018-03-31 – 2018-04-08 (×6): 650 mg via ORAL
  Filled 2018-03-28 (×6): qty 2

## 2018-03-28 MED ORDER — VENLAFAXINE HCL ER 75 MG PO CP24
75.0000 mg | ORAL_CAPSULE | ORAL | Status: DC
  Administered 2018-03-30 – 2018-04-11 (×7): 75 mg via ORAL
  Filled 2018-03-28 (×7): qty 1

## 2018-03-28 MED ORDER — ATORVASTATIN CALCIUM 80 MG PO TABS: 80.0000 mg | ORAL_TABLET | Freq: Every day | ORAL | 0 refills | Status: DC

## 2018-03-28 MED ORDER — ACETAMINOPHEN 160 MG/5ML PO SOLN: 650.0000 mg | ORAL | Status: DC | PRN

## 2018-03-28 MED ORDER — LORAZEPAM 0.5 MG PO TABS
0.5000 mg | ORAL_TABLET | Freq: Four times a day (QID) | ORAL | Status: DC
  Administered 2018-03-28 – 2018-04-12 (×58): 0.5 mg via ORAL
  Filled 2018-03-28 (×58): qty 1

## 2018-03-28 MED ORDER — ARIPIPRAZOLE 5 MG PO TABS
5.0000 mg | ORAL_TABLET | Freq: Every day | ORAL | Status: DC
  Administered 2018-03-29 – 2018-04-12 (×15): 5 mg via ORAL
  Filled 2018-03-28 (×15): qty 1

## 2018-03-28 MED ORDER — MIRTAZAPINE 15 MG PO TABS
15.0000 mg | ORAL_TABLET | Freq: Every day | ORAL | Status: DC
  Administered 2018-03-28 – 2018-04-11 (×15): 15 mg via ORAL
  Filled 2018-03-28 (×15): qty 1

## 2018-03-28 MED ORDER — BISACODYL 10 MG RE SUPP
10.0000 mg | Freq: Every day | RECTAL | Status: DC | PRN
  Administered 2018-04-04: 10 mg via RECTAL
  Filled 2018-03-28 (×2): qty 1

## 2018-03-28 MED ORDER — CLOPIDOGREL BISULFATE 75 MG PO TABS: 75.0000 mg | ORAL_TABLET | Freq: Every day | ORAL | 0 refills | Status: DC

## 2018-03-28 MED ORDER — SENNOSIDES-DOCUSATE SODIUM 8.6-50 MG PO TABS: 1.0000 | ORAL_TABLET | Freq: Every evening | ORAL | Status: DC | PRN

## 2018-03-28 MED ORDER — SORBITOL 70 % SOLN
30.0000 mL | Freq: Every day | Status: DC | PRN
  Administered 2018-03-29 – 2018-04-03 (×3): 30 mL via ORAL
  Filled 2018-03-28 (×3): qty 30

## 2018-03-28 MED ORDER — ACETAMINOPHEN 650 MG RE SUPP: 650.0000 mg | RECTAL | Status: DC | PRN

## 2018-03-28 MED ORDER — ONDANSETRON HCL 4 MG PO TABS: 4.0000 mg | ORAL_TABLET | Freq: Four times a day (QID) | ORAL | Status: DC | PRN

## 2018-03-28 MED ORDER — ONDANSETRON HCL 4 MG/2ML IJ SOLN: 4.0000 mg | Freq: Four times a day (QID) | INTRAMUSCULAR | Status: DC | PRN

## 2018-03-28 MED ORDER — CLOPIDOGREL BISULFATE 75 MG PO TABS
75.0000 mg | ORAL_TABLET | Freq: Every day | ORAL | Status: DC
  Administered 2018-03-29 – 2018-04-12 (×15): 75 mg via ORAL
  Filled 2018-03-28 (×15): qty 1

## 2018-03-28 MED ORDER — ASPIRIN 325 MG PO TABS
325.0000 mg | ORAL_TABLET | Freq: Every day | ORAL | Status: DC
  Administered 2018-03-29 – 2018-04-12 (×15): 325 mg via ORAL
  Filled 2018-03-28 (×15): qty 1

## 2018-03-28 NOTE — Progress Notes (Signed)
Patient admitted about 1635. Patient denied any pain. Vitals stable. Patient oriented to rehab unit and process. Patient educated on fall safety. All questions answered. Call bell and phone left within patient's reach.

## 2018-03-28 NOTE — Plan of Care (Signed)
  Problem: Consults Goal: RH STROKE PATIENT EDUCATION Description See Patient Education module for education specifics  Outcome: Progressing Goal: Diabetes Guidelines if Diabetic/Glucose > 140 Description If diabetic or lab glucose is > 140 mg/dl - Initiate Diabetes/Hyperglycemia Guidelines & Document Interventions  Outcome: Progressing   Problem: RH BOWEL ELIMINATION Goal: RH STG MANAGE BOWEL WITH ASSISTANCE Description STG Manage Bowel with mod I Assistance.  Outcome: Progressing Goal: RH STG MANAGE BOWEL W/MEDICATION W/ASSISTANCE Description STG Manage Bowel with Medication with mod I Assistance.  Outcome: Progressing   Problem: RH SKIN INTEGRITY Goal: RH STG SKIN FREE OF INFECTION/BREAKDOWN Description Maintain skin integrity with mod I assist   Outcome: Progressing   Problem: RH KNOWLEDGE DEFICIT Goal: RH STG INCREASE KNOWLEDGE OF DIABETES Description Patient able to state 2 S/S of hypoglycemia/hyperglycemia  Outcome: Progressing Goal: RH STG INCREASE KNOWLEGDE OF HYPERLIPIDEMIA Description Patient able to state 2 direct ways to manage high cholesterol.   Outcome: Progressing Goal: RH STG INCREASE KNOWLEDGE OF STROKE PROPHYLAXIS Outcome: Progressing   Problem: RH BLADDER ELIMINATION Goal: RH STG MANAGE BLADDER WITH ASSISTANCE Description STG Manage Bladder With mod I Assistance  Outcome: Not Progressing

## 2018-03-28 NOTE — Discharge Instructions (Signed)
Ischemic Stroke °An ischemic stroke is the sudden death of brain tissue. Blood carries oxygen to all areas of the body. This type of stroke happens when your blood does not flow to your brain like normal. Your brain cannot get the oxygen it needs. This is an emergency. It must be treated right away. °Symptoms of a stroke usually happen all of a sudden. You may notice them when you wake up. They can include: °· Weakness or loss of feeling in your face, arm, or leg. This often happens on one side of the body. °· Trouble walking. °· Trouble moving your arms or legs. °· Loss of balance or coordination. °· Feeling confused. °· Trouble talking or understanding what people are saying. °· Slurred speech. °· Trouble seeing. °· Seeing two of one object (double vision). °· Feeling dizzy. °· Feeling sick to your stomach (nauseous) and throwing up (vomiting). °· A very bad headache for no reason. ° °Get help as soon as any of these problems start. This is important. Some treatments work better if they are given right away. These include: °· Aspirin. °· Medicines to control blood pressure. °· A shot (injection) of medicine to break up the blood clot. °· Treatments given in the blood vessel (artery) to take out the clot or break it up. ° °Other treatments may include: °· Oxygen. °· Fluids given through an IV tube. °· Medicines to thin out your blood. °· Procedures to help your blood flow better. ° °What increases the risk? °Certain things may make you more likely to have a stroke. Some of these are things that you can change, such as: °· Being very overweight (obesity). °· Smoking. °· Taking birth control pills. °· Not being active. °· Drinking too much alcohol. °· Using drugs. ° °Other risk factors include: °· High blood pressure. °· High cholesterol. °· Diabetes. °· Heart disease. °· Being African American, Native American, Hispanic, or [place] Native. °· Being over age 60. °· Family history of stroke. °· Having had blood clots,  stroke, or warning stroke (transient ischemic attack, TIA) in the past. °· Sickle cell disease. °· Being a woman with a history of high blood pressure in pregnancy (preeclampsia). °· Migraine headache. °· Sleep apnea. °· Having an irregular heartbeat (atrial fibrillation). °· Long-term (chronic) diseases that cause soreness and swelling (inflammation). °· Disorders that affect how your blood clots. ° °Follow these instructions at home: °Medicines °· Take over-the-counter and prescription medicines only as told by your doctor. °· If you were told to take aspirin or another medicine to thin your blood, take it exactly as told by your doctor. °? Taking too much of the medicine can cause bleeding. °? If you do not take enough, it may not work as well. °· Know the side effects of your medicines. If you are taking a blood thinner, make sure you: °? Hold pressure over any cuts for longer than usual. °? Tell your dentist and other doctors that you take this medicine. °? Avoid activities that may cause damage or injury to your body. °Eating and drinking °· Follow instructions from your doctor about what you cannot eat or drink. °· Eat healthy foods. °· If you have trouble with swallowing, do these things to avoid choking: °? Take small bites when eating. °? Eat foods that are soft or pureed. °Safety °· Follow instructions from your health care team about physical activity. °· Use a walker or cane as told by your doctor. °· Keep your home safe so   you do not fall. This may include: ? Having experts look at your home to make sure it is safe. ? Putting grab bars in the bedroom and bathroom. ? Using raised toilets. ? Putting a seat in the shower. General instructions  Do not use any tobacco products. ? Examples of these are cigarettes, chewing tobacco, and e-cigarettes. ? If you need help quitting, ask your doctor.  Limit how much alcohol you drink. This means no more than 1 drink a day for nonpregnant women and 2  drinks a day for men. One drink equals 12 oz of beer, 5 oz of wine, or 1 oz of hard liquor.  If you need help to stop using drugs or alcohol, ask your doctor to refer you to a program or specialist.  Stay active. Exercise as told by your doctor.  Keep all follow-up visits as told by your doctor. This is important. Get help right away if:  You suddenly: ? Have weakness or loss of feeling in your face, arm, or leg. ? Feel confused. ? Have trouble talking or understanding what people are saying. ? Have trouble seeing. ? Have trouble walking. ? Have trouble moving your arms or legs. ? Feel dizzy. ? Lose your balance or coordination. ? Have a very bad headache and you do not know why.  You pass out (lose consciousness) or almost pass out.  You have jerky movements that you cannot control (seizure). These symptoms may be an emergency. Do not wait to see if the symptoms will go away. Get medical help right away. Call your local emergency services (911 in the [place] Do not drive yourself to the hospital. This information is not intended to replace advice given to you by your health care provider. Make sure you discuss any questions you have with your health care provider. Document Released: 07/01/2011 Document Revised: 12/23/2015 Document Reviewed: 10/08/2015 Elsevier Interactive Patient Education  2018 [employer].   Stroke Prevention Some health problems and behaviors may make it more likely for you to have a stroke. Below are ways to lessen your risk of having a stroke.  Be active for at least 30 minutes on most or all days.  Do not smoke. Try not to be around others who smoke.  Do not drink too much alcohol. ? Do not have more than 2 drinks a day if you are a man. ? Do not have more than 1 drink a day if you are a woman and are not pregnant.  Eat healthy foods, such as fruits and vegetables. If you were put on a specific diet, follow the diet as told.  Keep your cholesterol  levels under control through diet and medicines. Look for foods that are low in saturated fat, trans fat, cholesterol, and are high in fiber.  If you have diabetes, follow all diet plans and take your medicine as told.  Ask your doctor if you need treatment to lower your blood pressure. If you have high blood pressure (hypertension), follow all diet plans and take your medicine as told by your doctor.  If you are 62-55 years old, have your blood pressure checked every 3-5 years. If you are age 81 or older, have your blood pressure checked every year.  Keep a healthy weight. Eat foods that are low in calories, salt, saturated fat, trans fat, and cholesterol.  Do not take drugs.  Avoid birth control pills, if this applies. Talk to your doctor about the risks of taking birth control pills.  Talk to your doctor if you have sleep problems (sleep apnea).  Take all medicine as told by your doctor. ? You may be told to take aspirin or blood thinner medicine. Take this medicine as told by your doctor. ? Understand your medicine instructions.  Make sure any other conditions you have are being taken care of.  Get help right away if:  You suddenly lose feeling (you feel numb) or have weakness in your face, arm, or leg.  Your face or eyelid hangs down to one side.  You suddenly feel confused.  You have trouble talking (aphasia) or understanding what people are saying.  You suddenly have trouble seeing in one or both eyes.  You suddenly have trouble walking.  You are dizzy.  You lose your balance or your movements are clumsy (uncoordinated).  You suddenly have a very bad headache and you do not know the cause.  You have new chest pain.  Your heart feels like it is fluttering or skipping a beat (irregular heartbeat). Do not wait to see if the symptoms above go away. Get help right away. Call your local emergency services (911 in [place] Do not drive yourself to the hospital. This  information is not intended to replace advice given to you by your health care provider. Make sure you discuss any questions you have with your health care provider. Document Released: 01/11/2012 Document Revised: 12/18/2015 Document Reviewed: 01/12/2013 Elsevier Interactive Patient Education  [employer].

## 2018-03-28 NOTE — Progress Notes (Signed)
Inpatient Rehabilitation-Admissions Coordinator   [facility] has received insurance approval and medical approval for admit to CIR today. Pt, family, floor RN, CM, and SW updated on plan.   Please call if questions.   Jhonnie Garner, OTR/L  Rehab Admissions Coordinator  [phone] 03/28/2018 2:45 PM

## 2018-03-28 NOTE — Care Management Note (Signed)
Case Management Note  Patient Details  Name: [name] MRN: [id number] Date of Birth: 08-25-1940  Subjective/Objective:    Pt admitted with stroke. She is from home alone.           Action/Plan: Pt is discharging to CIR today. CM signing off.   Expected Discharge Date:  03/28/18               Expected Discharge Plan:  [place]  In-House Referral:     Discharge planning Services  CM Consult  Post Acute Care Choice:    Choice offered to:     DME Arranged:    DME Agency:     HH Arranged:    HH Agency:     Status of Service:  Completed, signed off  If discussed at [employer], dates discussed:    Additional Comments:  Pollie Friar, RN 03/28/2018, 2:59 PM

## 2018-03-28 NOTE — Discharge Summary (Signed)
Discharge Summary  [name] JQB:[id number] DOB: 03/29/1941  PCP: Crist Infante, MD  Admit date: 03/24/2018 Discharge date: 03/28/2018  Time spent: 25 minutes  Recommendations for Outpatient Follow-up:  1. Continue physical therapy 2. Take your medications as prescribed 3. Fall precautions  NEUROLOGY RECOMMENDATIONS: Recommend continue ongoing stroke work-up and aggressive risk factor modification.  Dual antiplatelet therapy for 3 weeks followed by plavix alone.  Physical occupational therapy and rehab consults.  Long discussion with the patient  Follow-up as an outpatient in the stroke clinic in 6 weeks.   Discharge Diagnoses:  Active Hospital Problems   Diagnosis Date Noted  . Acute ischemic stroke ([place])   . TIA (transient ischemic attack) 03/24/2018    Resolved Hospital Problems  No resolved problems to display.    Discharge Condition: Stable  Diet recommendation: Resume previous diet  Vitals:   03/28/18 0345 03/28/18 0812  BP: 124/63 (!) 155/82  Pulse: 81 (!) 101  Resp: 15 20  Temp: 98.1 F (36.7 C) 98 F (36.7 C)  SpO2: 97% 99%    History of present illness:   77 y.o.femalewith hx of DM, right breast cancer who presented to [facility] on 8/30 with left sided weakness and facial droop. CT of the brain showed no acute abnormality. 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 Pt has demonstrated ongoing left sided motor deficits and difficulty word finding.  03/28/18: Patient seen and examined at her bedside.  Left arm weakness and difficulty word finding persistence.  Continues to work with physical therapy with recommendation for inpatient rehab.    Hospital Course:  Active Problems:   TIA (transient ischemic attack)   Acute ischemic stroke ([place])  Acute nonhemorrhagic right paramedian pontine infarct Twelve-lead EKG done on  03/25/2018 independently reviewed revealed sinus rhythm with rate of 87 2D echo done on 03/24/2018 no PFO identified Continue aspirin 325 and Plavix 75 daily for 3 weeks then Plavix 75 mg daily Follow-up with neurology clinic after discharge in 6 weeks Inpatient rehab consult-pending insurance authorization Continue PT Fall precautions  Hyperlipidemia LDL unable to calculate Goal LDL less than 70 Continue high intensity statin Lipitor 80 mg daily  Uncontrolled type 2 diabetes with hyperglycemia and acute ischemic stroke Last A1c 8.6 on 03/25/2018 Continue insulin Goal A1c less than 7.0  Obesity BMI 31 Weight loss with healthy dieting and regular exercise outpatient  CKD 4 Baseline creatinine 1.7 with GFR of 28 Avoid nephrotoxic agents/dehydration/hypotension Avoid dehydration Gentle IV fluid hydration Monitor urine output Repeat BMP in the morning  Chronic diastolic CHF Last 2D echo done on 03/24/2018 revealed preserved EF with no wall motion abnormalities Daily weight Strict I's and O's  Chronic depression/anxiety Continue Effexor XR and Remeron    Procedures:  None  Consultations:  Neurology  Discharge Exam: BP (!) 155/82 (BP Location: Left Arm)   Pulse (!) 101   Temp 98 F (36.7 C) (Oral)   Resp 20   Ht 5\' 7"  (1.702 m)   Wt 90.6 kg   SpO2 99%   BMI 31.28 kg/m  . General: 77 y.o. year-old female well developed well nourished in no acute distress.  Alert with mild difficulty with word finding. . Cardiovascular: Regular rate and rhythm with no rubs or gallops.  No thyromegaly or JVD noted.   Marland Kitchen Respiratory: Clear to auscultation with no wheezes or rales. Good inspiratory effort. . Abdomen: Soft nontender nondistended with normal  bowel sounds x4 quadrants. . Musculoskeletal: No lower extremity edema. 2/4 pulses in all 4 extremities.  Left arm weakness 3 out of 5 strength. . Psychiatry: Mood is appropriate for condition and setting  Discharge  Instructions You were cared for by a hospitalist during your hospital stay. If you have any questions about your discharge medications or the care you received while you were in the hospital after you are discharged, you can call the unit and asked to speak with the hospitalist on call if the hospitalist that took care of you is not available. Once you are discharged, your primary care physician will handle any further medical issues. Please note that NO REFILLS for any discharge medications will be authorized once you are discharged, as it is imperative that you return to your primary care physician (or establish a relationship with a primary care physician if you do not have one) for your aftercare needs so that they can reassess your need for medications and monitor your lab values.   Allergies as of 03/28/2018   No Known Allergies     Medication List    STOP taking these medications   amLODipine 5 MG tablet Commonly known as:  NORVASC   metFORMIN 500 MG tablet Commonly known as:  GLUCOPHAGE   pravastatin 20 MG tablet Commonly known as:  PRAVACHOL   VASCEPA 1 g Caps Generic drug:  Icosapent Ethyl     TAKE these medications   ARIPiprazole 5 MG tablet Commonly known as:  ABILIFY Take 5 mg daily by mouth.   aspirin 325 MG EC tablet Take 1 tablet (325 mg total) by mouth once for 1 dose. What changed:    medication strength  how much to take  when to take this   atorvastatin 80 MG tablet Commonly known as:  LIPITOR Take 1 tablet (80 mg total) by mouth daily at 6 PM.   calcium carbonate 1500 (600 Ca) MG Tabs tablet Commonly known as:  OSCAL Take 600 mg of elemental calcium by mouth 2 (two) times daily with a meal.   clopidogrel 75 MG tablet Commonly known as:  PLAVIX Take 1 tablet (75 mg total) by mouth daily. Start taking on:  03/29/2018   HUMALOG MIX 75/25 KWIKPEN (75-25) 100 UNIT/ML Kwikpen Generic drug:  Insulin Lispro Prot & Lispro Take 38-44 Units by mouth See  admin instructions. Take 44 units in the morning and 38 units in the evening   linagliptin 5 MG Tabs tablet Commonly known as:  TRADJENTA Take 5 mg by mouth daily.   lisinopril 10 MG tablet Commonly known as:  PRINIVIL,ZESTRIL Take 10 mg by mouth daily.   LORazepam 0.5 MG tablet Commonly known as:  ATIVAN Take 0.5 mg by mouth 4 (four) times daily.   mirtazapine 15 MG tablet Commonly known as:  REMERON Take 15 mg by mouth at bedtime.   multivitamin-lutein Caps capsule Take 2 capsules by mouth daily.   omega-3 acid ethyl esters 1 g capsule Commonly known as:  LOVAZA Take 1 g by mouth 2 (two) times daily.   venlafaxine XR 75 MG 24 hr capsule Commonly known as:  EFFEXOR-XR Take 75 mg by mouth every other day.      No Known Allergies Follow-up Information    Crist Infante, MD. Call in 1 day(s).   Specialty:  Internal Medicine Contact information: [place] [address] [phone]        Nelva Bush, MD .   Specialty:  Cardiology Contact information:  [address] [place] [address] [phone]        Garvin Fila, MD. Call in 1 day(s).   Specialties:  Neurology, Radiology Why:  Please call for an appointment. Contact information: [address] [place] [address] [phone]            The results of significant diagnostics from this hospitalization (including imaging, microbiology, ancillary and laboratory) are listed below for reference.    Significant Diagnostic Studies: Mr Brain 4 Contrast  Result Date: 03/24/2018 CLINICAL DATA:  Acute onset of left-sided weakness and facial droop. Last known well yesterday evening. EXAM: MRI HEAD WITHOUT CONTRAST MRA HEAD WITHOUT CONTRAST TECHNIQUE: Multiplanar, multiecho pulse sequences of the brain and surrounding structures were obtained without intravenous contrast. Angiographic images of the head were obtained using MRA technique without contrast.  COMPARISON:  None. FINDINGS: MRI HEAD FINDINGS Brain: The diffusion-weighted images demonstrate an acute nonhemorrhagic 12 mm linear right paramedian pontine infarct. No other acute infarct is present. Subtle T2 signal changes are associated with the acute/subacute infarct. Scattered subcortical T2 hyperintensities bilaterally are mildly advanced for age. Ventricles are of normal size. No other cortical lesions are present. No significant extra-axial fluid collection is present. The cerebellum is within normal limits. The internal auditory canals are normal. Vascular: Flow is present in the major intracranial arteries. The left vertebral artery is dominant. Skull and upper cervical spine: The skull base is within normal limits. Craniocervical junction is within normal limits. Is present at C4-5. Marrow signal is normal. Sinuses/Orbits: The paranasal sinuses and mastoid air cells are clear. Bilateral lens replacements are present. Globes and orbits are within normal limits. MRA HEAD FINDINGS Internal carotid arteries demonstrate normal flow signal the high cervical segments through the ICA termini bilaterally. The A1 and M1 segments are normal. The anterior communicating artery is patent. MCA bifurcations are within normal limits. ACA and MCA branch vessels are normal. The left vertebral artery is the dominant vessel. The right vertebral artery is hypoplastic with segmental narrowing through the V4 segment. The right vertebral artery essentially terminates at the PICA. The left PICA origin is visualized and normal. The basilar artery is normal. The right posterior cerebral artery originates from basilar tip. The left posterior cerebral artery is of fetal type. There is some attenuation of distal PCA branch vessels bilaterally IMPRESSION: 1. 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 2. Scattered subcortical T2 hyperintensities bilaterally are mildly advanced for age. This likely reflects the  sequela of chronic microvascular ischemia. 3. MRA circle-of-Willis demonstrates mild distal small vessel disease without significant proximal stenosis, aneurysm, or branch vessel occlusion. 4. Degenerative changes of the cervical spine at C4-5. These results were called by telephone at the time of interpretation on 03/24/2018 at 11:37 am to Dr. Charlesetta Shanks , who verbally acknowledged these results. Electronically Signed   By: San Morelle M.D.   On: 03/24/2018 11:41   Mr Jodene Nam Head (cerebral Arteries)  Result Date: 03/24/2018 CLINICAL DATA:  Acute onset of left-sided weakness and facial droop. Last known well yesterday evening. EXAM: MRI HEAD WITHOUT CONTRAST MRA HEAD WITHOUT CONTRAST TECHNIQUE: Multiplanar, multiecho pulse sequences of the brain and surrounding structures were obtained without intravenous contrast. Angiographic images of the head were obtained using MRA technique without contrast. COMPARISON:  None. FINDINGS: MRI HEAD FINDINGS Brain: The diffusion-weighted images demonstrate an acute nonhemorrhagic 12 mm linear right paramedian pontine infarct. No other acute infarct is present. Subtle T2 signal changes are  associated with the acute/subacute infarct. Scattered subcortical T2 hyperintensities bilaterally are mildly advanced for age. Ventricles are of normal size. No other cortical lesions are present. No significant extra-axial fluid collection is present. The cerebellum is within normal limits. The internal auditory canals are normal. Vascular: Flow is present in the major intracranial arteries. The left vertebral artery is dominant. Skull and upper cervical spine: The skull base is within normal limits. Craniocervical junction is within normal limits. Is present at C4-5. Marrow signal is normal. Sinuses/Orbits: The paranasal sinuses and mastoid air cells are clear. Bilateral lens replacements are present. Globes and orbits are within normal limits. MRA HEAD FINDINGS Internal carotid  arteries demonstrate normal flow signal the high cervical segments through the ICA termini bilaterally. The A1 and M1 segments are normal. The anterior communicating artery is patent. MCA bifurcations are within normal limits. ACA and MCA branch vessels are normal. The left vertebral artery is the dominant vessel. The right vertebral artery is hypoplastic with segmental narrowing through the V4 segment. The right vertebral artery essentially terminates at the PICA. The left PICA origin is visualized and normal. The basilar artery is normal. The right posterior cerebral artery originates from basilar tip. The left posterior cerebral artery is of fetal type. There is some attenuation of distal PCA branch vessels bilaterally IMPRESSION: 1. 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 2. Scattered subcortical T2 hyperintensities bilaterally are mildly advanced for age. This likely reflects the sequela of chronic microvascular ischemia. 3. MRA circle-of-Willis demonstrates mild distal small vessel disease without significant proximal stenosis, aneurysm, or branch vessel occlusion. 4. Degenerative changes of the cervical spine at C4-5. These results were called by telephone at the time of interpretation on 03/24/2018 at 11:37 am to Dr. Charlesetta Shanks , who verbally acknowledged these results. Electronically Signed   By: San Morelle M.D.   On: 03/24/2018 11:41   Ct Head Code Stroke Wo Contrast  Result Date: 03/24/2018 CLINICAL DATA:  Code stroke.  Left-sided weakness. EXAM: CT HEAD WITHOUT CONTRAST TECHNIQUE: Contiguous axial images were obtained from the base of the skull through the vertex without intravenous contrast. COMPARISON:  None. FINDINGS: Brain: There is no evidence of acute infarct, intracranial hemorrhage, mass, midline shift, or extra-axial fluid collection. The ventricles and sulci are within normal limits for age. Scattered cerebral white matter hypodensities are nonspecific but  compatible with mild chronic small vessel ischemic disease. Vascular: Calcified atherosclerosis at the skull base. No hyperdense vessel. Skull: No fracture or focal osseous lesion. Sinuses/Orbits: Visualized paranasal sinuses and mastoid air cells are clear. Bilateral cataract extraction is noted. Other: None. ASPECTS [facility] Stroke Program Early CT Score) - Ganglionic level infarction (caudate, lentiform nuclei, internal capsule, insula, M1-M3 cortex): 7 - Supraganglionic infarction (M4-M6 cortex): 3 Total score (0-10 with 10 being normal): 10 IMPRESSION: 1. No evidence of acute intracranial abnormality. 2. ASPECTS is 10. 3. Mild chronic small vessel ischemic disease. These results were communicated to Dr. Lorraine Lax at 8:19 am on 03/24/2018 by text page via the [facility] messaging system. Electronically Signed   By: Logan Bores M.D.   On: 03/24/2018 08:20    Microbiology: No results found for this or any previous visit (from the past 240 hour(s)).   Labs: Basic Metabolic Panel: Recent Labs  Lab 03/24/18 0801 03/24/18 0814 03/25/18 0247 03/26/18 0644 03/27/18 0238 03/28/18 0540  NA 137 137 140 140 141 140  K 4.8 4.8 4.7 4.4 4.2 4.4  CL 105 104 107 110 110 107  CO2 24  --  23 23 24 25   GLUCOSE 232* 227* 137* 228* 162* 186*  BUN 30* 32* 31* 31* 28* 34*  CREATININE 1.95* 2.00* 1.70* 1.80* 1.79* 1.99*  CALCIUM 9.9  --  9.8 9.9 9.8 10.0   Liver Function Tests: Recent Labs  Lab 03/24/18 0801 03/25/18 0247  AST 43* 36  ALT 42 39  ALKPHOS 84 72  BILITOT 0.5 0.8  PROT 6.1* 5.9*  ALBUMIN 3.4* 3.1*   No results for input(s): LIPASE, AMYLASE in the last 168 hours. No results for input(s): AMMONIA in the last 168 hours. CBC: Recent Labs  Lab 03/24/18 0801 03/24/18 0814 03/25/18 0247 03/26/18 0644  WBC 5.5  --  5.8 5.2  NEUTROABS 3.6  --   --  3.7  HGB 10.9* 10.9* 11.8* 11.0*  HCT 32.6* 32.0* 35.6* 33.1*  MCV 96.2  --  97.0 95.1  PLT 197  --  191 187   Cardiac Enzymes: Recent Labs    Lab 03/24/18 1154 03/24/18 1935 03/25/18 0247  TROPONINI <0.03 <0.03 <0.03   BNP: BNP (last 3 results) No results for input(s): BNP in the last 8760 hours.  ProBNP (last 3 results) No results for input(s): PROBNP in the last 8760 hours.  CBG: Recent Labs  Lab 03/27/18 1103 03/27/18 1616 03/27/18 2205 03/28/18 0639 03/28/18 1100  GLUCAP 172* 189* 201* 193* 186*       Signed:  Kayleen Memos, MD [facility] 03/28/2018, 11:43 AM

## 2018-03-28 NOTE — H&P (Signed)
Physical Medicine and Rehabilitation Admission H&P    Chief Complaint  Patient presents with  . Code Stroke  : HPI: [name] is a 77 year old right-handed female with history of tachycardia status post ablation 2007, diabetes mellitus, right breast cancer status post lumpectomy as well as colon cancer, CKD stage III.  Per chart review patient lives alone was independent prior to admission and driving.  Family does provide Doctor, hospital.  One level town home.  Son is a Field seismologist.  Presented 03/24/2018 with left-sided weakness and facial droop as well as slurred speech.  Cranial CT scan negative for acute changes.  Patient did receive TPA.  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  No significant proximal stenosis aneurysm or branch vessel occlusion.  Echocardiogram with ejection fraction of 37% grade 1 diastolic dysfunction.  Carotid Dopplers with no ICA stenosis.  Neurology consulted presently on aspirin and Plavix for CVA prophylaxis.  Subcutaneous heparin for DVT prophylaxis.  Patient is tolerating a regular diet.  Physical and occupational therapy evaluations completed with recommendations of physical medicine rehab consult.  Patient was admitted for a comprehensive rehab program  Review of Systems  Constitutional: Negative for chills and fever.  HENT: Negative for hearing loss.   Eyes: Negative for blurred vision and double vision.  Respiratory: Negative for shortness of breath.   Cardiovascular: Positive for leg swelling. Negative for chest pain and palpitations.  Gastrointestinal: Positive for constipation. Negative for nausea and vomiting.  Genitourinary: Positive for urgency.  Musculoskeletal: Positive for myalgias.  Skin: Negative for rash.  Neurological: Positive for speech change and focal weakness. Negative for sensory change.  All other systems reviewed and are negative.  Past Medical History:  Diagnosis Date    . Anxiety   . Back ache   . Breast cancer ([place])    right  . Bronchitis   . Cancer of colon ([place])   . Depression   . DM (diabetes mellitus) ([place])   . Dyspnea on exertion   . Fall on steps    age 33 feel down iron stairs couldnot move leg for 6 mths  . Family history of primary TB   . Fracture of ankle, closed 2014   left  . History of chicken pox   . History of primary TB   . Hyperlipidemia   . Hypertension   . Kidney disease, chronic, stage III (moderate, EGFR 30-59 ml/min) ([place]) 2017  . Kidney disorder    reduction function  . Kidney stones   . Knee pain, right   . Lung abnormality    age 28 patient had pna spot on lungs   . Microalbuminuria   . Muscle cramps    both legs  . Muscular degeneration    of right eye  . Nephrolithiasis   . Palpitations   . Personal history of radiation therapy    Past Surgical History:  Procedure Laterality Date  . ABLATION  2007   fast heart rate  . ANKLE FRACTURE SURGERY Left 2014  . BREAST LUMPECTOMY Right   . CATARACT EXTRACTION Right 2017  . colonscopy  2015  . HYSTEROTOMY     45 years ago  . TONSILLECTOMY AND ADENOIDECTOMY     age 68   Family History  Problem Relation Age of Onset  . Peripheral Artery Disease Mother 27       HX OF POOR CIRCULATION, SMOKING, LEG AMPUTATION  . Cancer Father   . Asthma  Brother 45  . Kidney Stones Brother   . Breast cancer Neg Hx    Social History:  reports that she has never smoked. She has never used smokeless tobacco. She reports that she does not drink alcohol or use drugs. Allergies: No Known Allergies Medications Prior to Admission  Medication Sig Dispense Refill  . amLODipine (NORVASC) 5 MG tablet Take 5 mg by mouth daily.    . ARIPiprazole (ABILIFY) 5 MG tablet Take 5 mg daily by mouth.  3  . aspirin EC 81 MG tablet Take 81 mg by mouth daily.    . calcium carbonate (OSCAL) 1500 (600 Ca) MG TABS tablet Take 600 mg of elemental calcium by mouth 2 (two) times daily with a meal.    .  HUMALOG MIX 75/25 KWIKPEN (75-25) 100 UNIT/ML Kwikpen Take 38-44 Units by mouth See admin instructions. Take 44 units in the morning and 38 units in the evening  3  . Icosapent Ethyl (VASCEPA) 1 g CAPS Take 1 g by mouth 2 (two) times daily.    Marland Kitchen linagliptin (TRADJENTA) 5 MG TABS tablet Take 5 mg by mouth daily.    Marland Kitchen lisinopril (PRINIVIL,ZESTRIL) 10 MG tablet Take 10 mg by mouth daily.    Marland Kitchen LORazepam (ATIVAN) 0.5 MG tablet Take 0.5 mg by mouth 4 (four) times daily.    . metFORMIN (GLUCOPHAGE) 500 MG tablet Take 500 mg by mouth 2 (two) times daily with a meal.    . mirtazapine (REMERON) 15 MG tablet Take 15 mg by mouth at bedtime.    . multivitamin-lutein (OCUVITE-LUTEIN) CAPS capsule Take 2 capsules by mouth daily.    Marland Kitchen omega-3 acid ethyl esters (LOVAZA) 1 g capsule Take 1 g by mouth 2 (two) times daily.    . pravastatin (PRAVACHOL) 20 MG tablet Take 20 mg by mouth at bedtime.     Marland Kitchen venlafaxine XR (EFFEXOR-XR) 75 MG 24 hr capsule Take 75 mg by mouth every other day.      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  Functional Status:  Mobility: Bed Mobility Overal bed mobility: Needs Assistance Bed Mobility: Supine to Sit Supine to sit: Mod assist Sit to supine: Mod assist General bed mobility comments: on BSC upon PT arrival Transfers Overall transfer level: Needs assistance Equipment used: Rolling walker (2  wheeled) Transfers: Sit to/from Stand Sit to Stand: Min assist Stand pivot transfers: Mod assist(appaent coordination deficits) General transfer comment: Min A to power up and for general safety/balance Ambulation/Gait Ambulation/Gait assistance: Min assist, +2 safety/equipment Gait Distance (Feet): 75 Feet Assistive device: Rolling walker (2 wheeled) Gait Pattern/deviations: Step-to pattern, Step-through pattern, Decreased stride length, Drifts right/left, Staggering right General Gait Details: heavy preference to drift R with gait - seemingly unaware of L side with gait as well as difficulty navigating obstacles on L side Gait velocity: decreased Gait velocity interpretation: <1.31 ft/sec, indicative of household ambulator    ADL: ADL Overall ADL's : Needs assistance/impaired Eating/Feeding: Set up Grooming: Set up, Supervision/safety, Sitting Upper Body Bathing: Supervision/ safety, Set up, Sitting Lower Body  Bathing: Moderate assistance, Sit to/from stand Upper Body Dressing : Minimal assistance, Sitting Lower Body Dressing: Moderate assistance, Sit to/from stand Toilet Transfer: Moderate assistance, RW, BSC Toileting- Clothing Manipulation and Hygiene: Moderate assistance Functional mobility during ADLs: Moderate assistance, Rolling walker, Cueing for safety, Cueing for sequencing General ADL Comments: Poor orientation in space during gfunctional tasks; R bias; appears to dmeonstrate L inatteniton; Head prefernece toward L; left water running and required cues to turn off; difficulty with problem solving how to turn to sit in recliner; difficulty with maintaining spatial awareness to allow therapist room on L  Cognition: Cognition Overall Cognitive Status: Impaired/Different from baseline Arousal/Alertness: Awake/alert Orientation Level: Oriented X4 Attention: Selective, Alternating Selective Attention: Appears intact Alternating Attention: Impaired Memory: Impaired Memory  Impairment: Storage deficit, Decreased recall of new information(delayed recall 3/5) Awareness: Impaired Awareness Impairment: Emergent impairment("that's hard for me" "I guess my thinking is different.") Problem Solving: Impaired Problem Solving Impairment: Verbal basic(simple time 3/3, money 1/3 extended time, slow processing) Executive Function: Reasoning Reasoning: Impaired Reasoning Impairment: Verbal basic(abstraction, category naming impaired ) Safety/Judgment: Impaired Cognition Arousal/Alertness: Awake/alert Behavior During Therapy: WFL for tasks assessed/performed, Anxious Overall Cognitive Status: Impaired/Different from baseline Area of Impairment: Following commands, Safety/judgement, Problem solving Current Attention Level: Selective Memory: Decreased short-term memory Following Commands: Follows one step commands consistently, Follows one step commands with increased time Safety/Judgement: Decreased awareness of safety Awareness: Emergent Problem Solving: Slow processing, Difficulty sequencing, Requires verbal cues, Requires tactile cues  Physical Exam: Blood pressure 124/63, pulse 81, temperature 98.1 F (36.7 C), temperature source Oral, resp. rate 15, height _0  (1.702 m), weight 90.6 kg, SpO2 97 %. Physical Exam  Vitals reviewed. Constitutional: She is oriented to person, place, and time. She appears well-developed and well-nourished.  HENT:  Head: Normocephalic and atraumatic.  Eyes: EOM are normal. Right eye exhibits no discharge. Left eye exhibits no discharge.  Pupils reactive to light  Neck: Normal range of motion. Neck supple. No thyromegaly present.  Cardiovascular: Normal rate and regular rhythm.  Respiratory: Effort normal and breath sounds normal. No respiratory distress.  GI: Soft. Bowel sounds are normal. She exhibits no distension.  Musculoskeletal:  LE edema  Neurological: She is alert and oriented to person, place, and time.  Speech is  dysarthric but intelligible.   Follows simple commands.   Fair awareness of deficits.   Motor: LUE/LLE: 4/5 proximal to distal RUE/RLE: 5/5 proximal to distal  Skin: Skin is warm and dry.  Psychiatric: She has a normal mood and affect. Her behavior is normal. Thought content normal.    Results for orders placed or performed during the hospital encounter of 03/24/18 (from the past 48 hour(s))  Glucose, capillary     Status: Abnormal   Collection Time: 03/26/18 11:42 AM  Result Value Ref Range   Glucose-Capillary 223 (H) 70 - 99 mg/dL   Comment 1 Notify RN    Comment 2 Document in Chart   Glucose, capillary     Status: Abnormal   Collection Time: 03/26/18  4:35 PM  Result Value Ref Range   Glucose-Capillary 166 (H) 70 - 99 mg/dL   Comment 1 Notify RN    Comment 2 Document in Chart   Glucose, capillary     Status: Abnormal   Collection Time: 03/26/18 10:06 PM  Result Value Ref Range   Glucose-Capillary 124 (H) 70 - 99 mg/dL   Comment 1 Notify RN    Comment 2 Document in Chart   Basic metabolic panel  Status: Abnormal   Collection Time: 03/27/18  2:38 AM  Result Value Ref Range   Sodium 141 135 - 145 mmol/L   Potassium 4.2 3.5 - 5.1 mmol/L   Chloride 110 98 - 111 mmol/L   CO2 24 22 - 32 mmol/L   Glucose, Bld 162 (H) 70 - 99 mg/dL   BUN 28 (H) 8 - 23 mg/dL   Creatinine, Ser 1.79 (H) 0.44 - 1.00 mg/dL   Calcium 9.8 8.9 - 10.3 mg/dL   GFR calc non Af Amer 26 (L) >60 mL/min   GFR calc Af Amer 30 (L) >60 mL/min    Comment: (NOTE) The eGFR has been calculated using the CKD EPI equation. This calculation has not been validated in all clinical situations. eGFR's persistently <60 mL/min signify possible Chronic Kidney Disease.    Anion gap 7 5 - 15    Comment: Performed at [facility] [address] [place], [place] [address]  Glucose, capillary     Status: Abnormal   Collection Time: 03/27/18  6:23 AM  Result Value Ref Range   Glucose-Capillary 175 (H) 70 - 99  mg/dL   Comment 1 Notify RN    Comment 2 Document in Chart   Glucose, capillary     Status: Abnormal   Collection Time: 03/27/18 11:03 AM  Result Value Ref Range   Glucose-Capillary 172 (H) 70 - 99 mg/dL   Comment 1 Notify RN    Comment 2 Document in Chart   Glucose, capillary     Status: Abnormal   Collection Time: 03/27/18  4:16 PM  Result Value Ref Range   Glucose-Capillary 189 (H) 70 - 99 mg/dL   Comment 1 Notify RN    Comment 2 Document in Chart   Glucose, capillary     Status: Abnormal   Collection Time: 03/27/18 10:05 PM  Result Value Ref Range   Glucose-Capillary 201 (H) 70 - 99 mg/dL   Comment 1 Notify RN    Comment 2 Document in Chart   Basic metabolic panel     Status: Abnormal   Collection Time: 03/28/18  5:40 AM  Result Value Ref Range   Sodium 140 135 - 145 mmol/L   Potassium 4.4 3.5 - 5.1 mmol/L   Chloride 107 98 - 111 mmol/L   CO2 25 22 - 32 mmol/L   Glucose, Bld 186 (H) 70 - 99 mg/dL   BUN 34 (H) 8 - 23 mg/dL   Creatinine, Ser 1.99 (H) 0.44 - 1.00 mg/dL   Calcium 10.0 8.9 - 10.3 mg/dL   GFR calc non Af Amer 23 (L) >60 mL/min   GFR calc Af Amer 27 (L) >60 mL/min    Comment: (NOTE) The eGFR has been calculated using the CKD EPI equation. This calculation has not been validated in all clinical situations. eGFR's persistently <60 mL/min signify possible Chronic Kidney Disease.    Anion gap 8 5 - 15    Comment: Performed at [facility] [address] [place], [place] [address]  Glucose, capillary     Status: Abnormal   Collection Time: 03/28/18  6:39 AM  Result Value Ref Range   Glucose-Capillary 193 (H) 70 - 99 mg/dL   Comment 1 Notify RN    Comment 2 Document in Chart    No results found.     Medical Problem List and Plan: 1.  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  DVT Prophylaxis/Anticoagulation: Subcutaneous heparin 3. Pain Management:  Tylenol as needed 4. Mood: Remeron 15 mg nightly, Effexor or 75 mg every other day, Ativan 0.4 mg 4 times daily, Abilify 5 mg daily 5. Neuropsych: This patient is capable of making decisions on her own behalf. 6. Skin/Wound Care: Routine skin checks 7. Fluids/Electrolytes/Nutrition: Routine in and outs with follow-up chemistries 8.  Diabetes mellitus.  Hemoglobin A1c 8.6.  NovoLog 70/30 10 units twice daily.  Check blood sugars before meals and at bedtime 9.  History of tachycardia with ablation 2007.  No chest pain or shortness of breath. 10.  CKD stage III.  Baseline creatinine 1.95.  Follow-up chemistries 11.  History of breast cancer with lumpectomy.  Follow-up outpatient 12.  Hyperlipidemia.  Lipitor   Post Admission Physician Evaluation: 1. Preadmission assessment reviewed and changes made below. 2. Functional deficits secondary  to right paramedian pontine infarct. 3. Patient is admitted to receive collaborative, interdisciplinary care between the physiatrist, rehab nursing staff, and therapy team. 4. Patient's level of medical complexity and substantial therapy needs in context of that medical necessity cannot be provided at a lesser intensity of care such as a SNF. 5. Patient has experienced substantial functional loss from his/her baseline which was documented above under the "Functional History" and "Functional Status" headings.  Judging by the patient's diagnosis, physical exam, and functional history, the patient has potential for functional progress which will result in measurable gains while on inpatient rehab.  These gains will be of substantial and practical use upon discharge  in facilitating mobility and self-care at the household level. 28. Physiatrist will provide 24 hour management of medical needs as well as oversight of the therapy plan/treatment and provide guidance as appropriate regarding the interaction of the two. 7. 24 hour rehab nursing will assist with bladder  management, bowel management, safety, pain management and patient education  and help integrate therapy concepts, techniques,education, etc. 8. PT will assess and treat for/with: Lower extremity strength, range of motion, stamina, balance, functional mobility, safety, adaptive techniques and equipment, coping skills, pain control, stroke education. Goals are: Supervision. 9. OT will assess and treat for/with: ADL's, functional mobility, safety, upper extremity strength, adaptive techniques and equipment, ego support, and community reintegration.   Goals are: Supervision. Therapy may proceed with showering this patient. 10. Case Management and Social Worker will assess and treat for psychological issues and discharge planning. 11. Team conference will be held weekly to assess progress toward goals and to determine barriers to discharge. 12. Patient will receive at least 3 hours of therapy per day at least 5 days per week. 12-16 days 13. Prognosis:  good  I have personally performed a face to face diagnostic evaluation, including, but not limited to relevant history and physical exam findings, of this patient and developed relevant assessment and plan.  Additionally, I have reviewed and concur with the physician assistant's documentation above.   The patient's status has not changed. The original post admission physician evaluation remains appropriate, and any changes from the pre-admission screening or documentation from the acute chart are noted above.  Delice Lesch, MD, ABPMR [facility] Paganini Angiulli, PA-C 03/28/2018

## 2018-03-28 NOTE — Progress Notes (Signed)
PMR Admission Coordinator Pre-Admission Assessment  Patient: [name] is an 77 y.o., female MRN: [id number] DOB: 06/03/1941 Height: 5' 7" (170.2 cm) Weight: 90.6 kg                                                                                                                                                  Insurance Information HMO:     PPO: Yes     PCP:      IPA:      80/20:      OTHER:  PRIMARY: [employer] Medicare      Policy#: [id number]      Subscriber: Patient CM Name: [name]      Phone#: [phone]     Fax#: [phone] Approval received from [place] at [facility] for admit date 03/28/18. Clinical updates are due on day 7 (04/04/18) to [name].  Pre-Cert#: [id number]      Employer:  Benefits:  Phone #: NA     Name: [name] Eff. Date: 07/26/17     Deduct: $0       Out of Pocket Max: $2,000 (met $90.00)      Life Max: NA CIR: 100%      SNF: $0/day for unlimited days, 100% Outpatient: 100% per necessity     Co-Pay:  Home Health: 100% per necessity      Co-Pay:  DME: 100%     Co-Pay:  Providers:  SECONDARY:       Policy#:       Subscriber:  CM Name:       Phone#:      Fax#:  Pre-Cert#:       Employer:  Benefits:  Phone #:      Name:  Eff. Date:      Deduct:      Out of Pocket Max:      Life Max:  CIR:       SNF:  Outpatient:     Co-Pay:  Home Health:       Co-Pay:  DME:      Co-Pay:   Medicaid Application Date:       Case Manager: Disability Application Date:       Case Worker:   Emergency Contact Information         Contact Information    Name Relation Home Work Mobile   [place] Son [phone]       Current Medical History  Patient Admitting Diagnosis: right pontine infarct History of Present Illness: [name] a 77 year old right-handed female with history of tachycardia status post ablation 2007, diabetes mellitus, right breast cancer status post lumpectomy as well as colon cancer, CKD stage III. Per chart review patient lives alone  was independent prior to admission and driving. Family does provide Doctor, hospital. One level town home. Son is a Field seismologist. Presented 03/24/2018 with left-sided  weakness and facial droop as well as slurred speech. Cranial CT scan negative for acute changes. Patient did receive TPA. 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 No significant proximal stenosis aneurysm or branch vessel occlusion. Echocardiogram with ejection fraction of 79% grade 1 diastolic dysfunction. Carotid Dopplers with no ICA stenosis. Neurology consulted presently on aspirin and Plavix for CVA prophylaxis. Subcutaneous heparin for DVT prophylaxis. Patient is tolerating a regular diet. Physical and occupational therapy evaluations completed with recommendations of physical medicine rehab consult. Patient was admitted for a comprehensive rehab program  Complete NIHSS TOTAL: 4  Past Medical History      Past Medical History:  Diagnosis Date  . Anxiety   . Back ache   . Breast cancer ([place])    right  . Bronchitis   . Cancer of colon ([place])   . Depression   . DM (diabetes mellitus) ([place])   . Dyspnea on exertion   . Fall on steps    age 2 feel down iron stairs couldnot move leg for 6 mths  . Family history of primary TB   . Fracture of ankle, closed 2014   left  . History of chicken pox   . History of primary TB   . Hyperlipidemia   . Hypertension   . Kidney disease, chronic, stage III (moderate, EGFR 30-59 ml/min) ([place]) 2017  . Kidney disorder    reduction function  . Kidney stones   . Knee pain, right   . Lung abnormality    age 54 patient had pna spot on lungs   . Microalbuminuria   . Muscle cramps    both legs  . Muscular degeneration    of right eye  . Nephrolithiasis   . Palpitations   . Personal history of radiation therapy     Family History  family history includes Asthma (age of onset: 7) in  her brother; Cancer in her father; Kidney Stones in her brother; Peripheral Artery Disease (age of onset: 81) in her mother.  Prior Rehab/Hospitalizations:  Has the patient had major surgery during 100 days prior to admission? No  Current Medications   Current Facility-Administered Medications:  .  acetaminophen (TYLENOL) tablet 650 mg, 650 mg, Oral, Q4H PRN, 650 mg at 03/27/18 2145 **OR** acetaminophen (TYLENOL) solution 650 mg, 650 mg, Per Tube, Q4H PRN **OR** acetaminophen (TYLENOL) suppository 650 mg, 650 mg, Rectal, Q4H PRN, Abrol, Nayana, MD .  ARIPiprazole (ABILIFY) tablet 5 mg, 5 mg, Oral, Knapp, Abrol, Nayana, MD, 5 mg at 03/28/18 0859 .  aspirin EC tablet 325 mg, 325 mg, Oral, Once, Pfeiffer, Marcy, MD .  aspirin tablet 325 mg, 325 mg, Oral, Knapp, Abrol, Nayana, MD, 325 mg at 03/28/18 0858 .  atorvastatin (LIPITOR) tablet 80 mg, 80 mg, Oral, q1800, Cristal Deer, MD, 80 mg at 03/27/18 1728 .  bisacodyl (DULCOLAX) suppository 10 mg, 10 mg, Rectal, Knapp PRN, Georgette Shell, MD .  clopidogrel (PLAVIX) tablet 75 mg, 75 mg, Oral, Knapp, Abrol, Nayana, MD, 75 mg at 03/28/18 0858 .  heparin injection 5,000 Units, 5,000 Units, Subcutaneous, Q8H, Hall, Carole N, DO, 5,000 Units at 03/28/18 1354 .  insulin aspart (novoLOG) injection 0-9 Units, 0-9 Units, Subcutaneous, TID WC, Bodenheimer, Charles A, NP, 2 Units at 03/28/18 1219 .  insulin aspart protamine- aspart (NOVOLOG MIX 70/30) injection 10 Units, 10 Units, Subcutaneous, BID WC, Abrol, Nayana, MD, 10 Units at 03/28/18 0900 .  LORazepam (ATIVAN) tablet 0.5 mg, 0.5 mg,  Oral, QID, Reyne Dumas, MD, 0.5 mg at 03/28/18 1218 .  mirtazapine (REMERON) tablet 15 mg, 15 mg, Oral, QHS, Abrol, Nayana, MD, 15 mg at 03/27/18 2145 .  senna-docusate (Senokot-S) tablet 1 tablet, 1 tablet, Oral, QHS PRN, Reyne Dumas, MD, 1 tablet at 03/27/18 2146 .  venlafaxine XR (EFFEXOR-XR) 24 hr capsule 75 mg, 75 mg, Oral, QODAY, Abrol, Nayana, MD, 75 mg  at 03/28/18 0859  Patients Current Diet:     Diet Order                  Diet Carb Modified Fluid consistency: Thin; Room service appropriate? Yes  Diet effective now               Precautions / Restrictions Precautions Precautions: Fall Precaution Comments: ? L inattention Restrictions Weight Bearing Restrictions: No   Has the patient had 2 or more falls or a fall with injury in the past year?No  Prior Activity Level Limited Community (1-2x/wk): approv 4x/week  Home Assistive Devices / Equipment Home Assistive Devices/Equipment: None Home Equipment: None  Prior Device Use: Indicate devices/aids used by the patient prior to current illness, exacerbation or injury? None of the above  Prior Functional Level Prior Function Level of Independence: Independent Comments: drove; medication management; family does financial mangement  Self Care: Did the patient need help bathing, dressing, using the toilet or eating?  Independent  Indoor Mobility: Did the patient need assistance with walking from room to room (with or without device)? Independent  Stairs: Did the patient need assistance with internal or external stairs (with or without device)? Independent  Functional Cognition: Did the patient need help planning regular tasks such as shopping or remembering to take medications? Independent  Current Functional Level Cognition  Arousal/Alertness: Awake/alert Overall Cognitive Status: Impaired/Different from baseline Current Attention Level: Selective Orientation Level: Oriented X4 Following Commands: Follows one step commands consistently, Follows one step commands with increased time Safety/Judgement: Decreased awareness of safety, Decreased awareness of deficits Attention: Selective, Alternating Selective Attention: Appears intact Alternating Attention: Impaired Memory: Impaired Memory Impairment: Storage deficit, Decreased recall of new  information(delayed recall 3/5) Awareness: Impaired Awareness Impairment: Emergent impairment("that's hard for me" "I guess my thinking is different.") Problem Solving: Impaired Problem Solving Impairment: Verbal basic(simple time 3/3, money 1/3 extended time, slow processing) Executive Function: Reasoning Reasoning: Impaired Reasoning Impairment: Verbal basic(abstraction, category naming impaired ) Safety/Judgment: Impaired    Extremity Assessment (includes Sensation/Coordination)  Upper Extremity Assessment: LUE deficits/detail LUE Deficits / Details: continues to dmeonstratea apparent LUE sensorimotor deficits; able to use LUE to turn on water and assist with ADL; requires cues to use at times; poor in hand manipulation skills and general weakness throught LUE LUE Sensation: decreased light touch LUE Coordination: decreased fine motor, decreased gross motor  Lower Extremity Assessment: Defer to PT evaluation RLE Deficits / Details: 5/5 LLE Deficits / Details: grossly 5/5 except hip flexion 4/5 LLE Coordination: WNL    ADLs  Overall ADL's : Needs assistance/impaired Eating/Feeding: Set up Grooming: Set up, Supervision/safety, Sitting Upper Body Bathing: Supervision/ safety, Set up, Sitting Lower Body Bathing: Moderate assistance, Sit to/from stand Upper Body Dressing : Minimal assistance, Sitting Lower Body Dressing: Moderate assistance, Sit to/from stand Toilet Transfer: Moderate assistance, RW, BSC Toileting- Clothing Manipulation and Hygiene: Moderate assistance Functional mobility during ADLs: Moderate assistance, Rolling walker, Cueing for safety, Cueing for sequencing General ADL Comments: Poor orientation in space during gfunctional tasks; R bias; appears to dmeonstrate L inatteniton; Head prefernece toward L; left  water running and required cues to turn off; difficulty with problem solving how to turn to sit in recliner; difficulty with maintaining spatial awareness to  allow therapist room on L    Mobility  Overal bed mobility: Needs Assistance Bed Mobility: Supine to Sit Supine to sit: Mod assist Sit to supine: Mod assist General bed mobility comments: seated EOB upon PT arrival    Transfers  Overall transfer level: Needs assistance Equipment used: Rolling walker (2 wheeled) Transfers: Sit to/from Stand Sit to Stand: Min assist Stand pivot transfers: Mod assist(appaent coordination deficits) General transfer comment: Min A to power up from bedside - verbal cueing to push from bed rather than pull at RW. unsteradiness requiring Min A upon initial standing    Ambulation / Gait / Stairs / Wheelchair Mobility  Ambulation/Gait Ambulation/Gait assistance: Herbalist (Feet): 75 Feet Assistive device: Rolling walker (2 wheeled) Gait Pattern/deviations: Step-through pattern, Decreased stride length, Staggering right, Drifts right/left, Trunk flexed General Gait Details: downward gaze preference with verbal cueing to improve posturing with limited carryover. Continues to drift to R side with patient unaware of this requiring cueing. verbal cueing to navigate obstacles Gait velocity: decreased Gait velocity interpretation: <1.31 ft/sec, indicative of household ambulator    Posture / Balance Dynamic Sitting Balance Sitting balance - Comments: L bias initially, and posterior lean with no foot support during muscle testing Balance Overall balance assessment: Needs assistance Sitting-balance support: No upper extremity supported, Feet supported Sitting balance-Leahy Scale: Fair Sitting balance - Comments: L bias initially, and posterior lean with no foot support during muscle testing Standing balance support: Bilateral upper extremity supported, During functional activity Standing balance-Leahy Scale: Poor Standing balance comment: reliant on external support    Special needs/care consideration BiPAP/CPAP: no  CPM: no Continuous Drip  IV Dialysis: no        Days: no Life Vest: no Oxygen: no Special Bed: no Trach Size: no Wound Vac (area): no      Location: no Skin: no areas of concern                     Bowel mgmt:Last BM 03/24/18 (reports feeling stopped up) Bladder mgmt: continent; BSC Diabetic mgmt yes     Previous Home Environment Living Arrangements: Alone  Lives With: Alone Available Help at Discharge: Family Type of Home: (townhome) Home Layout: One level Home Access: (town home) Bathroom Shower/Tub: Public librarian, Architectural technologist: Standard Bathroom Accessibility: Yes How Accessible: Accessible via walker Home Care Services: No  Discharge Living Setting Plans for Discharge Living Setting: Patient's home, Alone Type of Home at Discharge: House Discharge Home Layout: One level Discharge Home Access: Stairs to enter Entrance Stairs-Rails: None Entrance Stairs-Number of Steps: 3 Discharge Bathroom Shower/Tub: Tub/shower unit Discharge Bathroom Toilet: Standard Discharge Bathroom Accessibility: No Does the patient have any problems obtaining your medications?: No  Social/Family/Support Systems Patient Roles: Other (Comment)(is mother to son; grandmother) Sport and exercise psychologist Information: son Elta [place]) [phone] Anticipated Caregiver: family and friends Anticipated Caregiver's Contact Information: Anticipate pt to be Mod I at DC.  Ability/Limitations of Caregiver: friends could provide intermittant supervision as needed Caregiver Availability: Intermittent Discharge Plan Discussed with Primary Caregiver: No(in front of son (per son and pt, her friends live nearby)) Is Caregiver In Agreement with Plan?: Yes Does Caregiver/Family have Issues with Lodging/Transportation while Pt is in Rehab?: No   Goals/Additional Needs Patient/Family Goal for Rehab: PT/OT/SLP: Mod I Expected length of stay: 13-19 days Cultural Considerations: NA Dietary Needs: carb  modified; thin liquids; calorie level    Equipment Needs: TBD Pt/Family Agrees to Admission and willing to participate: Yes Program Orientation Provided & Reviewed with Pt/Caregiver Including Roles  & Responsibilities: Yes(reviewed with pt and son Elta [place]))  Barriers to Discharge: Home environment access/layout, Decreased caregiver support  Barriers to Discharge Comments: 3 steps to enter; no handrails; tub shower   Decrease burden of Care through IP rehab admission: NA   Possible need for SNF placement upon discharge:not anticipated; pt has Mod I goals with good prognosis for further progress.    Patient Condition: This patient's medical and functional status has changed since the consult dated: 03/25/18 in which the Rehabilitation Physician determined and documented that the patient's condition is appropriate for intensive rehabilitative care in an inpatient rehabilitation facility. See "History of Present Illness" (above) for medical update. Functional changes are: progression from Mod A for sit to stand and Mod A x2 for 10 feet to Min A sit to stand transfers and Min A 75 feet for ambulation . Patient's medical and functional status update has been discussed with the Rehabilitation physician and patient remains appropriate for inpatient rehabilitation. Will admit to inpatient rehab today.  Preadmission Screen Completed By:  Jhonnie Garner, 03/28/2018 2:55 PM ______________________________________________________________________   Discussed status with Dr. Posey Pronto on 03/28/18 at 2:54PM and received telephone approval for admission today.  Admission Coordinator:  Jhonnie Garner, time 2:54PM/Date 03/28/18           Cosigned by: Jamse Arn, MD at 03/28/2018 3:01 PM  Revision History

## 2018-03-28 NOTE — Progress Notes (Signed)
Physical Medicine and Rehabilitation Consult Reason for Consult:left sided weaknes Referring Physician: Leonie Man     HPI: [name] is a 77 y.o. female with hx of DM, right breast cancer who presented to [facility] on 8/30 with left sided weakness and facial droop. CT of the brain showed no acute abnormality. 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 Pt has demonstrated ongoing left sided motor deficits and PM&R was consulted to assess for potential rehab needs.      Review of Systems  Constitutional: Negative for fever.  HENT: Negative for hearing loss.   Eyes: Negative for blurred vision.  Respiratory: Positive for cough.   Cardiovascular: Negative for chest pain.  Gastrointestinal: Negative for nausea.  Genitourinary: Positive for urgency.  Musculoskeletal: Negative for myalgias.  Skin: Negative for rash.  Neurological: Positive for focal weakness.  Psychiatric/Behavioral: Negative for depression.        Past Medical History:  Diagnosis Date  . Anxiety    . Back ache    . Breast cancer ([place])      right  . Bronchitis    . Cancer of colon ([place])    . Depression    . DM (diabetes mellitus) ([place])    . Dyspnea on exertion    . Fall on steps      age 66 feel down iron stairs couldnot move leg for 6 mths  . Family history of primary TB    . Fracture of ankle, closed 2014    left  . History of chicken pox    . History of primary TB    . Hyperlipidemia    . Hypertension    . Kidney disease, chronic, stage III (moderate, EGFR 30-59 ml/min) ([place]) 2017  . Kidney disorder      reduction function  . Kidney stones    . Knee pain, right    . Lung abnormality      age 42 patient had pna spot on lungs   . Microalbuminuria    . Muscle cramps      both legs  . Muscular degeneration      of right eye  . Nephrolithiasis    . Palpitations    .  Personal history of radiation therapy           Past Surgical History:  Procedure Laterality Date  . ABLATION   2007    fast heart rate  . ANKLE FRACTURE SURGERY Left 2014  . BREAST LUMPECTOMY Right    . CATARACT EXTRACTION Right 2017  . colonscopy   2015  . HYSTEROTOMY        45 years ago  . TONSILLECTOMY AND ADENOIDECTOMY        age 44         Family History  Problem Relation Age of Onset  . Peripheral Artery Disease Mother 82        HX OF POOR CIRCULATION, SMOKING, LEG AMPUTATION  . Cancer Father    . Asthma Brother 50  . Kidney Stones Brother    . Breast cancer Neg Hx      Social History:  reports that she has never smoked. She has never used smokeless tobacco. She reports that she does not drink alcohol or use drugs. Allergies: No Known Allergies       Medications Prior to Admission  Medication Sig Dispense Refill  . amLODipine (NORVASC) 5 MG tablet  Take 5 mg by mouth daily.      . ARIPiprazole (ABILIFY) 5 MG tablet Take 5 mg daily by mouth.   3  . aspirin EC 81 MG tablet Take 81 mg by mouth daily.      . calcium carbonate (OSCAL) 1500 (600 Ca) MG TABS tablet Take 600 mg of elemental calcium by mouth 2 (two) times daily with a meal.      . HUMALOG MIX 75/25 KWIKPEN (75-25) 100 UNIT/ML Kwikpen Take 38-44 Units by mouth See admin instructions. Take 44 units in the morning and 38 units in the evening   3  . Icosapent Ethyl (VASCEPA) 1 g CAPS Take 1 g by mouth 2 (two) times daily.      Marland Kitchen linagliptin (TRADJENTA) 5 MG TABS tablet Take 5 mg by mouth daily.      Marland Kitchen lisinopril (PRINIVIL,ZESTRIL) 10 MG tablet Take 10 mg by mouth daily.      Marland Kitchen LORazepam (ATIVAN) 0.5 MG tablet Take 0.5 mg by mouth 4 (four) times daily.      . metFORMIN (GLUCOPHAGE) 500 MG tablet Take 500 mg by mouth 2 (two) times daily with a meal.      . mirtazapine (REMERON) 15 MG tablet Take 15 mg by mouth at bedtime.      . multivitamin-lutein (OCUVITE-LUTEIN) CAPS capsule Take 2 capsules by mouth daily.      Marland Kitchen  omega-3 acid ethyl esters (LOVAZA) 1 g capsule Take 1 g by mouth 2 (two) times daily.      . pravastatin (PRAVACHOL) 20 MG tablet Take 20 mg by mouth at bedtime.       Marland Kitchen venlafaxine XR (EFFEXOR-XR) 75 MG 24 hr capsule Take 75 mg by mouth every other day.          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 Functional Status:  Mobility: Bed Mobility Overal bed mobility: Needs Assistance Bed Mobility: Sit to Supine Supine to sit: Mod assist Sit to supine: Mod assist General bed mobility comments: Assist to elevate trunk from sidelying and subsequently assist to bring BLE's onto bed once returned Transfers Overall transfer level: Needs assistance Transfers: Sit to/from Stand, Stand Pivot Transfers Sit to Stand: Mod assist Stand pivot transfers: Mod assist(appaent coordination deficits) General transfer comment: posterior lean able to correct with vc  Ambulation/Gait Ambulation/Gait assistance: Mod assist, +2 physical assistance Gait Distance (Feet): 10 Feet Assistive device: 2 person hand held assist Gait Pattern/deviations: Step-through pattern, Decreased stride length, Decreased dorsiflexion - right, Decreased dorsiflexion - left, Shuffle, Narrow base of support, Leaning posteriorly General Gait Details: Patient with posterior bias and difficulty with anterior weight shift. Cues for weight shifting and movement initiation Gait velocity: decreased Gait velocity interpretation: <1.31 ft/sec, indicative of household ambulator    ADL: ADL Overall ADL's : Needs assistance/impaired Eating/Feeding: Set up, Sitting Grooming: Sitting Upper Body Bathing: Minimal assistance Lower Body Bathing: Moderate assistance, Sit to/from stand Upper Body Dressing : Moderate assistance, Sitting Lower Body Dressing: Moderate assistance, Sit to/from stand Toilet Transfer: Moderate assistance, Stand-pivot Toileting- Clothing Manipulation and Hygiene: Moderate assistance Functional mobility during ADLs: Moderate assistance(A to lift and control descent)   Cognition: Cognition Overall Cognitive  Status: Impaired/Different from baseline Orientation Level: Oriented X4 Cognition Arousal/Alertness: Awake/alert Behavior During Therapy: Anxious Overall Cognitive Status: Impaired/Different from baseline Area of Impairment: Attention, Memory, Awareness Current Attention Level: Selective Memory: Decreased short-term memory Awareness: Emergent   Blood pressure 140/77, pulse 89, temperature 98.4 F (36.9 C), temperature source Oral, resp. rate 14, height 5' 7"  (1.702 m), weight 90.6 kg, SpO2 95 %. Physical Exam  Constitutional: She is oriented to person, place, and time. She appears well-developed.  obese  HENT:  Head: Normocephalic and atraumatic.  Eyes: Pupils are equal, round, and reactive to light.  Neck: Normal range of motion.  Cardiovascular: Normal rate.  Respiratory: Effort normal.  GI: Soft.  Musculoskeletal: She exhibits no edema.  Neurological: She is alert and oriented to person, place, and time.  Left central 7, voice strong but slightly dysarthric. Cognitively diplays good insight and awareness. Functional memory, normal language. RUE and RLE 5/5. LUE 3+ to 4-/5. LLE 3 to 3+/5. Senses pain and light touch on all 4. No resting tone  Skin: Skin is warm.  Psychiatric: She has a normal mood and affect. Her behavior is normal.      Lab Results Last 24 Hours       Results for orders placed or performed during the  hospital encounter of 03/24/18 (from the past 24 hour(s))  Troponin I     Status: None    Collection Time: 03/24/18  7:35 PM  Result Value Ref Range    Troponin I <0.03 <0.03 ng/mL  Glucose, capillary     Status: Abnormal    Collection Time: 03/24/18  7:50 PM  Result Value Ref Range    Glucose-Capillary 172 (H) 70 - 99 mg/dL    Comment 1 Notify RN      Comment 2 Document in Chart    Troponin I     Status: None    Collection Time: 03/25/18  2:47 AM  Result Value Ref Range    Troponin I <0.03 <0.03 ng/mL  Hemoglobin A1c     Status: Abnormal    Collection Time: 03/25/18  2:47 AM  Result Value Ref Range    Hgb A1c MFr Bld 8.6 (H) 4.8 - 5.6 %    Mean Plasma Glucose 200.12 mg/dL  Lipid panel     Status: Abnormal    Collection Time: 03/25/18  2:47 AM  Result Value Ref Range    Cholesterol 189 0 - 200 mg/dL    Triglycerides 578 (H) <150 mg/dL    HDL 25 (L) >40 mg/dL    Total CHOL/HDL Ratio 7.6 RATIO    VLDL UNABLE TO CALCULATE IF TRIGLYCERIDE OVER 400 mg/dL 0 - 40 mg/dL    LDL Cholesterol UNABLE TO CALCULATE IF TRIGLYCERIDE OVER 400 mg/dL 0 - 99 mg/dL  CBC     Status: Abnormal    Collection Time: 03/25/18  2:47 AM  Result Value Ref Range    WBC 5.8 4.0 - 10.5 K/uL    RBC 3.67 (L) 3.87 - 5.11 MIL/uL    Hemoglobin 11.8 (L) 12.0 - 15.0 g/dL    HCT 35.6 (L) 36.0 - 46.0 %    MCV 97.0 78.0 - 100.0 fL    MCH 32.2 26.0 - 34.0 pg    MCHC 33.1 30.0 - 36.0 g/dL    RDW 13.1 11.5 - 15.5 %    Platelets 191 150 - 400 K/uL  Comprehensive metabolic panel     Status: Abnormal    Collection Time: 03/25/18  2:47  AM  Result Value Ref Range    Sodium 140 135 - 145 mmol/L    Potassium 4.7 3.5 - 5.1 mmol/L    Chloride 107 98 - 111 mmol/L    CO2 23 22 - 32 mmol/L    Glucose, Bld 137 (H) 70 - 99 mg/dL    BUN 31 (H) 8 - 23 mg/dL    Creatinine, Ser 1.70 (H) 0.44 - 1.00 mg/dL    Calcium 9.8 8.9 - 10.3 mg/dL    Total Protein 5.9 (L) 6.5 - 8.1 g/dL    Albumin 3.1 (L) 3.5 - 5.0 g/dL    AST 36 15 - 41  U/L    ALT 39 0 - 44 U/L    Alkaline Phosphatase 72 38 - 126 U/L    Total Bilirubin 0.8 0.3 - 1.2 mg/dL    GFR calc non Af Amer 28 (L) >60 mL/min    GFR calc Af Amer 32 (L) >60 mL/min    Anion gap 10 5 - 15  Glucose, capillary     Status: Abnormal    Collection Time: 03/25/18  6:05 AM  Result Value Ref Range    Glucose-Capillary 161 (H) 70 - 99 mg/dL    Comment 1 Notify RN      Comment 2 Document in Chart    Glucose, capillary     Status: Abnormal    Collection Time: 03/25/18 11:33 AM  Result Value Ref Range    Glucose-Capillary 192 (H) 70 - 99 mg/dL    Comment 1 Notify RN      Comment 2 Document in Chart    Glucose, capillary     Status: Abnormal    Collection Time: 03/25/18  4:22 PM  Result Value Ref Range    Glucose-Capillary 170 (H) 70 - 99 mg/dL    Comment 1 Notify RN      Comment 2 Document in Chart         Imaging Results (Last 48 hours)  Mr Brain Wo Contrast   Result Date: 03/24/2018 CLINICAL DATA:  Acute onset of left-sided weakness and facial droop. Last known well yesterday evening. EXAM: MRI HEAD WITHOUT CONTRAST MRA HEAD WITHOUT CONTRAST TECHNIQUE: Multiplanar, multiecho pulse sequences of the brain and surrounding structures were obtained without intravenous contrast. Angiographic images of the head were obtained using MRA technique without contrast. COMPARISON:  None. FINDINGS: MRI HEAD FINDINGS Brain: The diffusion-weighted images demonstrate an acute nonhemorrhagic 12 mm linear right paramedian pontine infarct. No other acute infarct is present. Subtle T2 signal changes are associated with the acute/subacute infarct. Scattered subcortical T2 hyperintensities bilaterally are mildly advanced for age. Ventricles are of normal size. No other cortical lesions are present. No significant extra-axial fluid collection is present. The cerebellum is within normal limits. The internal auditory canals are normal. Vascular: Flow is present in the major intracranial arteries. The  left vertebral artery is dominant. Skull and upper cervical spine: The skull base is within normal limits. Craniocervical junction is within normal limits. Is present at C4-5. Marrow signal is normal. Sinuses/Orbits: The paranasal sinuses and mastoid air cells are clear. Bilateral lens replacements are present. Globes and orbits are within normal limits. MRA HEAD FINDINGS Internal carotid arteries demonstrate normal flow signal the high cervical segments through the ICA termini bilaterally. The A1 and M1 segments are normal. The anterior communicating artery is patent. MCA bifurcations are within normal limits. ACA and MCA branch vessels are normal. The left vertebral artery is the dominant vessel. The  right vertebral artery is hypoplastic with segmental narrowing through the V4 segment. The right vertebral artery essentially terminates at the PICA. The left PICA origin is visualized and normal. The basilar artery is normal. The right posterior cerebral artery originates from basilar tip. The left posterior cerebral artery is of fetal type. There is some attenuation of distal PCA branch vessels bilaterally IMPRESSION: 1. 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 2. Scattered subcortical T2 hyperintensities bilaterally are mildly advanced for age. This likely reflects the sequela of chronic microvascular ischemia. 3. MRA circle-of-Willis demonstrates mild distal small vessel disease without significant proximal stenosis, aneurysm, or branch vessel occlusion. 4. Degenerative changes of the cervical spine at C4-5. These results were called by telephone at the time of interpretation on 03/24/2018 at 11:37 am to Dr. Charlesetta Shanks , who verbally acknowledged these results. Electronically Signed   By: San Morelle M.D.   On: 03/24/2018 11:41    Mr Jodene Nam Head (cerebral Arteries)   Result Date: 03/24/2018 CLINICAL DATA:  Acute onset of left-sided weakness and facial droop. Last known well  yesterday evening. EXAM: MRI HEAD WITHOUT CONTRAST MRA HEAD WITHOUT CONTRAST TECHNIQUE: Multiplanar, multiecho pulse sequences of the brain and surrounding structures were obtained without intravenous contrast. Angiographic images of the head were obtained using MRA technique without contrast. COMPARISON:  None. FINDINGS: MRI HEAD FINDINGS Brain: The diffusion-weighted images demonstrate an acute nonhemorrhagic 12 mm linear right paramedian pontine infarct. No other acute infarct is present. Subtle T2 signal changes are associated with the acute/subacute infarct. Scattered subcortical T2 hyperintensities bilaterally are mildly advanced for age. Ventricles are of normal size. No other cortical lesions are present. No significant extra-axial fluid collection is present. The cerebellum is within normal limits. The internal auditory canals are normal. Vascular: Flow is present in the major intracranial arteries. The left vertebral artery is dominant. Skull and upper cervical spine: The skull base is within normal limits. Craniocervical junction is within normal limits. Is present at C4-5. Marrow signal is normal. Sinuses/Orbits: The paranasal sinuses and mastoid air cells are clear. Bilateral lens replacements are present. Globes and orbits are within normal limits. MRA HEAD FINDINGS Internal carotid arteries demonstrate normal flow signal the high cervical segments through the ICA termini bilaterally. The A1 and M1 segments are normal. The anterior communicating artery is patent. MCA bifurcations are within normal limits. ACA and MCA branch vessels are normal. The left vertebral artery is the dominant vessel. The right vertebral artery is hypoplastic with segmental narrowing through the V4 segment. The right vertebral artery essentially terminates at the PICA. The left PICA origin is visualized and normal. The basilar artery is normal. The right posterior cerebral artery originates from basilar tip. The left posterior  cerebral artery is of fetal type. There is some attenuation of distal PCA branch vessels bilaterally IMPRESSION: 1. 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 2. Scattered subcortical T2 hyperintensities bilaterally are mildly advanced for age. This likely reflects the sequela of chronic microvascular ischemia. 3. MRA circle-of-Willis demonstrates mild distal small vessel disease without significant proximal stenosis, aneurysm, or branch vessel occlusion. 4. Degenerative changes of the cervical spine at C4-5. These results were called by telephone at the time of interpretation on 03/24/2018 at 11:37 am to Dr. Charlesetta Shanks , who verbally acknowledged these results. Electronically Signed   By: San Morelle M.D.   On: 03/24/2018 11:41    Ct Head Code Stroke Wo Contrast   Result Date: 03/24/2018 CLINICAL DATA:  Code stroke.  Left-sided weakness. EXAM: CT HEAD WITHOUT CONTRAST TECHNIQUE: Contiguous axial images were obtained from the base of the skull through the vertex without intravenous contrast. COMPARISON:  None. FINDINGS: Brain: There is no evidence of acute infarct, intracranial hemorrhage, mass, midline shift, or extra-axial fluid collection. The ventricles and sulci are within normal limits for age. Scattered cerebral white matter hypodensities are nonspecific but compatible with mild chronic small vessel ischemic disease. Vascular: Calcified atherosclerosis at the skull base. No hyperdense vessel. Skull: No fracture or focal osseous lesion. Sinuses/Orbits: Visualized paranasal sinuses and mastoid air cells are clear. Bilateral cataract extraction is noted. Other: None. ASPECTS [facility] Stroke Program Early CT Score) - Ganglionic level infarction (caudate, lentiform nuclei, internal capsule, insula, M1-M3 cortex): 7 - Supraganglionic infarction (M4-M6 cortex): 3 Total score (0-10 with 10 being normal): 10 IMPRESSION: 1. No evidence of acute intracranial abnormality. 2. ASPECTS is  10. 3. Mild chronic small vessel ischemic disease. These results were communicated to Dr. Lorraine Lax at 8:19 am on 03/24/2018 by text page via the [facility] messaging system. Electronically Signed   By: Logan Bores M.D.   On: 03/24/2018 08:20       Assessment/Plan: Diagnosis: right pontine infarct 1. Does the need for close, 24 hr/day medical supervision in concert with the patient's rehab needs make it unreasonable for this patient to be served in a less intensive setting? Yes 2. Co-Morbidities requiring supervision/potential complications: HTN, post-stroke sequelae, neurogenic bowel and bladder 3. Due to bladder management, bowel management, safety, skin/wound care, disease management, medication administration and patient education, does the patient require 24 hr/day rehab nursing? Yes 4. Does the patient require coordinated care of a physician, rehab nurse, PT (1-2 hrs/day, 5 days/week), OT (1-2 hrs/day, 5 days/week) and SLP (1-2 hrs/day, 5 days/week) to address physical and functional deficits in the context of the above medical diagnosis(es)? Yes Addressing deficits in the following areas: balance, endurance, locomotion, strength, transferring, bowel/bladder control, bathing, dressing, feeding, grooming, toileting, speech, swallowing and psychosocial support 5. Can the patient actively participate in an intensive therapy program of at least 3 hrs of therapy per day at least 5 days per week? Yes 6. The potential for patient to make measurable gains while on inpatient rehab is excellent 7. Anticipated functional outcomes upon discharge from inpatient rehab are modified independent  with PT, modified independent with OT, modified independent with SLP. 8. Estimated rehab length of stay to reach the above functional goals is: 13-19 days 9. Anticipated D/C setting: Home 10. Anticipated post D/C treatments: [place] therapy 11. Overall Rehab/Functional Prognosis: excellent   RECOMMENDATIONS: This patient's  condition is appropriate for continued rehabilitative care in the following setting: CIR Patient has agreed to participate in recommended program. Yes Note that insurance prior authorization may be required for reimbursement for recommended care.   Comment: Rehab Admissions Coordinator to follow up.   Thanks,   Meredith Staggers, MD, Mellody Drown   I have personally performed a face to face diagnostic evaluation of this patient. Additionally, I have reviewed and concur with the physician assistant's documentation above.      Meredith Staggers, MD 03/25/2018         Routing History

## 2018-03-28 NOTE — Care Management Important Message (Signed)
Important Message  Patient Details  Name: [name] MRN: [id number] Date of Birth: [birth year]   Medicare Important Message Given:  Yes    [name] [name] 03/28/2018, 4:03 PM

## 2018-03-28 NOTE — Progress Notes (Signed)
Physical Therapy Treatment Patient Details Name: [name] MRN: [id number] DOB: 05-16-1941 Today's Date: 03/28/2018    History of Present Illness 77 year old female with a history of tachycardia status post ablation in 2007, hypertension, dyslipidemia, type 2 diabetes insulin-dependent, chronic kidney disease stage III, history of breast cancer, history of colon cancer, who presents to the ED this morning with left-sided weakness, left facial droopNIHSS 3 due to left facial droop, left arm drift, and dysarthria. 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     PT Comments    Ms. Janusz motivated to participate with PT this morning. Patient reporting she feels like she is starting to get her strength back but still feels like she is leaning in static sitting. Gait limited due to fatigue/weakness with continued required verbal cueing for forward gaze, as well as obstacle/hallway navigation due to R drift. Will continue to follow.    Follow Up Recommendations  CIR     Equipment Recommendations  Other (comment)(defer)    Recommendations for Other Services       Precautions / Restrictions Precautions Precautions: Fall Precaution Comments: ? L inattention Restrictions Weight Bearing Restrictions: No    Mobility  Bed Mobility               General bed mobility comments: seated EOB upon PT arrival  Transfers Overall transfer level: Needs assistance Equipment used: Rolling walker (2 wheeled) Transfers: Sit to/from Stand Sit to Stand: Min assist         General transfer comment: Min A to power up from bedside - verbal cueing to push from bed rather than pull at RW. unsteradiness requiring Min A upon initial standing  Ambulation/Gait Ambulation/Gait assistance: Min assist Gait Distance (Feet): 75 Feet Assistive device: Rolling walker (2 wheeled) Gait Pattern/deviations: Step-through pattern;Decreased stride length;Staggering right;Drifts  right/left;Trunk flexed Gait velocity: decreased   General Gait Details: downward gaze preference with verbal cueing to improve posturing with limited carryover. Continues to drift to R side with patient unaware of this requiring cueing. verbal cueing to navigate obstacles   Stairs             Wheelchair Mobility    Modified Rankin (Stroke Patients Only) Modified Rankin (Stroke Patients Only) Pre-Morbid Rankin Score: No significant disability Modified Rankin: Moderately severe disability     Balance Overall balance assessment: Needs assistance Sitting-balance support: No upper extremity supported;Feet supported Sitting balance-Leahy Scale: Fair     Standing balance support: Bilateral upper extremity supported;During functional activity Standing balance-Leahy Scale: Poor Standing balance comment: reliant on external support                            Cognition Arousal/Alertness: Awake/alert Behavior During Therapy: WFL for tasks assessed/performed;Anxious Overall Cognitive Status: Impaired/Different from baseline Area of Impairment: Following commands;Safety/judgement;Problem solving                       Following Commands: Follows one step commands consistently;Follows one step commands with increased time Safety/Judgement: Decreased awareness of safety;Decreased awareness of deficits   Problem Solving: Slow processing;Difficulty sequencing;Requires verbal cues;Requires tactile cues        Exercises Other Exercises Other Exercises: standing marching in place x 15 each side    General Comments        Pertinent Vitals/Pain Pain Assessment: No/denies pain Pain Score: 0-No pain    Home Living  Prior Function            PT Goals (current goals can now be found in the care plan section) Acute Rehab PT Goals Patient Stated Goal: to return to being independent PT Goal Formulation: With patient/family Time  For Goal Achievement: 04/07/18 Potential to Achieve Goals: Good Progress towards PT goals: Progressing toward goals    Frequency    Min 4X/week      PT Plan Current plan remains appropriate    Co-evaluation              AM-PAC PT "6 Clicks" Daily Activity  Outcome Measure  Difficulty turning over in bed (including adjusting bedclothes, sheets and blankets)?: A Little Difficulty moving from lying on back to sitting on the side of the bed? : A Lot Difficulty sitting down on and standing up from a chair with arms (e.g., wheelchair, bedside commode, etc,.)?: Unable Help needed moving to and from a bed to chair (including a wheelchair)?: A Little Help needed walking in hospital room?: A Little Help needed climbing 3-5 steps with a railing? : A Lot 6 Click Score: 14    End of Session Equipment Utilized During Treatment: Gait belt Activity Tolerance: Patient tolerated treatment well Patient left: in chair;with call bell/phone within reach Nurse Communication: Mobility status PT Visit Diagnosis: Unsteadiness on feet (R26.81);Other abnormalities of gait and mobility (R26.89);Difficulty in walking, not elsewhere classified (R26.2);Other symptoms and signs involving the nervous system (H41.740)     Time: 8144-8185 PT Time Calculation (min) (ACUTE ONLY): 24 min  Charges:  $Gait Training: 8-22 mins $Therapeutic Activity: 8-22 mins                     Lanney Gins, PT, DPT 03/28/18 9:09 AM Pager: [phone]

## 2018-03-29 ENCOUNTER — Encounter (HOSPITAL_COMMUNITY): Payer: Self-pay | Admitting: *Deleted

## 2018-03-29 ENCOUNTER — Inpatient Hospital Stay (HOSPITAL_COMMUNITY): Payer: Medicare Other | Admitting: Physical Therapy

## 2018-03-29 ENCOUNTER — Inpatient Hospital Stay (HOSPITAL_COMMUNITY): Payer: Medicare Other | Admitting: Occupational Therapy

## 2018-03-29 ENCOUNTER — Other Ambulatory Visit: Payer: Self-pay

## 2018-03-29 ENCOUNTER — Inpatient Hospital Stay (HOSPITAL_COMMUNITY): Payer: Medicare Other | Admitting: Speech Pathology

## 2018-03-29 DIAGNOSIS — I639 Cerebral infarction, unspecified: Secondary | ICD-10-CM

## 2018-03-29 DIAGNOSIS — G8114 Spastic hemiplegia affecting left nondominant side: Secondary | ICD-10-CM

## 2018-03-29 DIAGNOSIS — I6322 Cerebral infarction due to unspecified occlusion or stenosis of basilar arteries: Secondary | ICD-10-CM

## 2018-03-29 DIAGNOSIS — I63211 Cerebral infarction due to unspecified occlusion or stenosis of right vertebral arteries: Secondary | ICD-10-CM

## 2018-03-29 DIAGNOSIS — I739 Peripheral vascular disease, unspecified: Secondary | ICD-10-CM

## 2018-03-29 DIAGNOSIS — N183 Chronic kidney disease, stage 3 (moderate): Secondary | ICD-10-CM

## 2018-03-29 LAB — COMPREHENSIVE METABOLIC PANEL
ALT: 46 U/L — ABNORMAL HIGH (ref 0–44)
AST: 38 U/L (ref 15–41)
Albumin: 3.3 g/dL — ABNORMAL LOW (ref 3.5–5.0)
Alkaline Phosphatase: 82 U/L (ref 38–126)
Anion gap: 9 (ref 5–15)
BUN: 35 mg/dL — ABNORMAL HIGH (ref 8–23)
CO2: 23 mmol/L (ref 22–32)
Calcium: 10.2 mg/dL (ref 8.9–10.3)
Chloride: 105 mmol/L (ref 98–111)
Creatinine, Ser: 1.92 mg/dL — ABNORMAL HIGH (ref 0.44–1.00)
GFR calc Af Amer: 28 mL/min — ABNORMAL LOW (ref >60)
GFR calc non Af Amer: 24 mL/min — ABNORMAL LOW (ref >60)
Glucose, Bld: 208 mg/dL — ABNORMAL HIGH (ref 70–99)
Potassium: 4.4 mmol/L (ref 3.5–5.1)
Sodium: 137 mmol/L (ref 135–145)
Total Bilirubin: 0.4 mg/dL (ref 0.3–1.2)
Total Protein: 6.3 g/dL — ABNORMAL LOW (ref 6.5–8.1)

## 2018-03-29 LAB — CBC WITH DIFFERENTIAL/PLATELET
Abs Immature Granulocytes: 0 10*3/uL (ref 0.0–0.1)
Basophils Absolute: 0 10*3/uL (ref 0.0–0.1)
Basophils Relative: 0 %
Eosinophils Absolute: 0.2 10*3/uL (ref 0.0–0.7)
Eosinophils Relative: 3 %
HCT: 36.2 % (ref 36.0–46.0)
Hemoglobin: 11.9 g/dL — ABNORMAL LOW (ref 12.0–15.0)
Immature Granulocytes: 0 %
Lymphocytes Relative: 22 %
Lymphs Abs: 1.5 10*3/uL (ref 0.7–4.0)
MCH: 31.6 pg (ref 26.0–34.0)
MCHC: 32.9 g/dL (ref 30.0–36.0)
MCV: 96 fL (ref 78.0–100.0)
Monocytes Absolute: 0.6 10*3/uL (ref 0.1–1.0)
Monocytes Relative: 8 %
Neutro Abs: 4.5 10*3/uL (ref 1.7–7.7)
Neutrophils Relative %: 67 %
Platelets: 207 10*3/uL (ref 150–400)
RBC: 3.77 MIL/uL — ABNORMAL LOW (ref 3.87–5.11)
RDW: 13.2 % (ref 11.5–15.5)
WBC: 6.8 10*3/uL (ref 4.0–10.5)

## 2018-03-29 LAB — GLUCOSE, CAPILLARY
Glucose-Capillary: 195 mg/dL — ABNORMAL HIGH (ref 70–99)
Glucose-Capillary: 210 mg/dL — ABNORMAL HIGH (ref 70–99)
Glucose-Capillary: 184 mg/dL — ABNORMAL HIGH (ref 70–99)
Glucose-Capillary: 162 mg/dL — ABNORMAL HIGH (ref 70–99)

## 2018-03-29 NOTE — Progress Notes (Signed)
Subjective/Complaints:  No issues overnite, some RIght ear discomfort, no hearing loss some RIght sided neck pain with ROM  ROS  Denies CP, SOB, N/V/D  Objective: Vital Signs: Blood pressure (!) 142/75, pulse 85, temperature 98.3 F (36.8 C), temperature source Oral, resp. rate 18, height 5' 7"  (1.702 m), weight 85 kg, SpO2 97 %. No results found. Results for orders placed or performed during the hospital encounter of 03/28/18 (from the past 72 hour(s))  CBC     Status: Abnormal   Collection Time: 03/28/18  5:32 AM  Result Value Ref Range   WBC 6.0 4.0 - 10.5 K/uL   RBC 3.72 (L) 3.87 - 5.11 MIL/uL   Hemoglobin 11.7 (L) 12.0 - 15.0 g/dL   HCT 36.1 36.0 - 46.0 %   MCV 97.0 78.0 - 100.0 fL   MCH 31.5 26.0 - 34.0 pg   MCHC 32.4 30.0 - 36.0 g/dL   RDW 13.1 11.5 - 15.5 %   Platelets 178 150 - 400 K/uL    Comment: Performed at [facility], [place] [address] [place], [place] [address]  Glucose, capillary     Status: Abnormal   Collection Time: 03/28/18  5:51 PM  Result Value Ref Range   Glucose-Capillary 188 (H) 70 - 99 mg/dL   Comment 1 Document in Chart   Glucose, capillary     Status: Abnormal   Collection Time: 03/28/18  9:07 PM  Result Value Ref Range   Glucose-Capillary 223 (H) 70 - 99 mg/dL  CBC WITH DIFFERENTIAL     Status: Abnormal   Collection Time: 03/29/18  5:29 AM  Result Value Ref Range   WBC 6.8 4.0 - 10.5 K/uL   RBC 3.77 (L) 3.87 - 5.11 MIL/uL   Hemoglobin 11.9 (L) 12.0 - 15.0 g/dL   HCT 36.2 36.0 - 46.0 %   MCV 96.0 78.0 - 100.0 fL   MCH 31.6 26.0 - 34.0 pg   MCHC 32.9 30.0 - 36.0 g/dL   RDW 13.2 11.5 - 15.5 %   Platelets 207 150 - 400 K/uL   Neutrophils Relative % 67 %   Neutro Abs 4.5 1.7 - 7.7 K/uL   Lymphocytes Relative 22 %   Lymphs Abs 1.5 0.7 - 4.0 K/uL   Monocytes Relative 8 %   Monocytes Absolute 0.6 0.1 - 1.0 K/uL   Eosinophils Relative 3 %   Eosinophils Absolute 0.2 0.0 - 0.7 K/uL   Basophils Relative 0 %   Basophils Absolute 0.0 0.0 -  0.1 K/uL   Immature Granulocytes 0 %   Abs Immature Granulocytes 0.0 0.0 - 0.1 K/uL    Comment: Performed at [facility], [address] [place], [place] [address]  Comprehensive metabolic panel     Status: Abnormal   Collection Time: 03/29/18  5:29 AM  Result Value Ref Range   Sodium 137 135 - 145 mmol/L   Potassium 4.4 3.5 - 5.1 mmol/L   Chloride 105 98 - 111 mmol/L   CO2 23 22 - 32 mmol/L   Glucose, Bld 208 (H) 70 - 99 mg/dL   BUN 35 (H) 8 - 23 mg/dL   Creatinine, Ser 1.92 (H) 0.44 - 1.00 mg/dL   Calcium 10.2 8.9 - 10.3 mg/dL   Total Protein 6.3 (L) 6.5 - 8.1 g/dL   Albumin 3.3 (L) 3.5 - 5.0 g/dL   AST 38 15 - 41 U/L   ALT 46 (H) 0 - 44 U/L   Alkaline Phosphatase 82  38 - 126 U/L   Total Bilirubin 0.4 0.3 - 1.2 mg/dL   GFR calc non Af Amer 24 (L) >60 mL/min   GFR calc Af Amer 28 (L) >60 mL/min    Comment: (NOTE) The eGFR has been calculated using the CKD EPI equation. This calculation has not been validated in all clinical situations. eGFR's persistently <60 mL/min signify possible Chronic Kidney Disease.    Anion gap 9 5 - 15    Comment: Performed at [facility] [address], [place], [place] [address]  Glucose, capillary     Status: Abnormal   Collection Time: 03/29/18  7:03 AM  Result Value Ref Range   Glucose-Capillary 195 (H) 70 - 99 mg/dL   Comment 1 Notify RN      HEENT: normal Cardio: RRR and no murmur Resp: CTA B/L and unlabored GI: BS positive and NT, ND Extremity:  No Edema Skin:   Intact and Other IV site CDI Neuro: Alert/Oriented, Normal Sensory, Abnormal Motor 4= Left delt bi tri grip HF, KE 3- L ankle DF, Abnormal FMC Ataxic/ dec FMC and Dysarthric Musc/Skel:  Other RIght sided neck pain with ROM Gen NAD   Assessment/Plan: 1. 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 Physiatrist is  providing close team supervision and 24 hour management of active medical problems listed below. Physiatrist and rehab team continue to assess barriers to discharge/monitor patient progress toward functional and medical goals. FIM:                   Function - Comprehension Comprehension: Auditory Comprehension assist level: Understands complex 90% of the time/cues 10% of the time  Function - Expression Expression: Verbal Expression assist level: Expresses complex ideas: With extra time/assistive device  Function - Social Interaction Social Interaction assist level: Interacts appropriately with others with medication or extra time (anti-anxiety, antidepressant).  Function - Problem Solving Problem solving assist level: Solves basic problems with no assist  Function - Memory Memory assist level: More than reasonable amount of time Patient normally able to recall (first 3 days only): Current season, Location of own room, Staff names and faces, That he or she is in a hospital  Medical Problem List and Plan: 1.  Left side weakness with facial droop and slurred speech secondary to right paramedian pontine infarct secondary to small vessel disease Initial evals today 2.  DVT Prophylaxis/Anticoagulation: Subcutaneous heparin 3. Pain Management: Tylenol as needed  4. Mood: Remeron 15 mg nightly, Effexor or 75 mg every other day, Ativan 0.4 mg 4 times daily, Abilify 5 mg daily Dr Sima Matas to follow 5. Neuropsych: This patient is capable of making decisions on her own behalf. 6. Skin/Wound Care: Routine skin checks 7. Fluids/Electrolytes/Nutrition: Routine in and outs with follow-up chemistries 8.  Diabetes mellitus.  Hemoglobin A1c 8.6.  NovoLog 70/30 10 units twice daily.  Check blood sugars before meals and at bedtime CBG (last 3)  Recent Labs    03/28/18 1751 03/28/18 2107 03/29/18 0703  GLUCAP 188* 223* 195*  above desired range will adjust 9.  History of tachycardia  with ablation 2007.  No chest pain or shortness of breath. 10.  CKD stage III.  Baseline creatinine 1.95.  Follow-up chemistries Creat is baseline BUN mildly elevated 11.  History of breast cancer with lumpectomy.  Follow-up outpatient 12.  Hyperlipidemia.  Lipitor  LOS (Days) 1 A FACE  TO FACE EVALUATION WAS PERFORMED  [name] 03/29/2018, 7:44 AM

## 2018-03-29 NOTE — Progress Notes (Signed)
Physical Therapy Session Note  Patient Details  Name: [name] MRN: [id number] Date of Birth: 1940/08/23  Today's Date: 03/29/2018 PT Individual Time: 8850-2774 PT Individual Time Calculation (min): 26 min   Short Term Goals: Week 1:  PT Short Term Goal 1 (Week 1): Pt will perform bed mobility with S from flat bed PT Short Term Goal 2 (Week 1): Pt will perform transfers with consistent S PT Short Term Goal 3 (Week 1): Pt will ambulate x150' with LRAD and S PT Short Term Goal 4 (Week 1): Pt will ascend/descend 3 steps no rails with minA  Skilled Therapeutic Interventions/Progress Updates:  Pt received in bed but agreeable to tx, no c/o pain reported. Pt transfers supine<>sitting EOB with bed features as needed. Pt transfers to w/c via stand pivot with steady assist. Transported pt to gym via w/c total assist for time management. Pt negotiates ramp and uneven surface with RW & min assist with cuing to turn and occasional management of RW in mulch. Pt retrieved object from floor with min assist for balance. Pt ambulates gym>room with RW & steady assist with cuing for increased step length LLE; pt demonstrates absent heel strike and decreased step length BLE & decreased stride length. At end of session pt returned to bed & left with alarm set & all needs met. Pt's son [name]) arrived at end of session inquiring about pt's CLOF with therapist educating him. [name] reports he is having a rail installed at pt's home to increase ease of access. Pt reports fatigue at end of session.   Therapy Documentation Precautions:  Precautions Precautions: Fall Precaution Comments: mild L inattention Restrictions Weight Bearing Restrictions: No   See Function Navigator for Current Functional Status.   Therapy/Group: Individual Therapy  Waunita Schooner 03/29/2018, 1:47 PM

## 2018-03-29 NOTE — Evaluation (Signed)
Speech Language Pathology Assessment and Plan  Patient Details  Name: [name] MRN: [id number] Date of Birth: 04-10-1941  SLP Diagnosis: Cognitive Impairments;Dysarthria  Rehab Potential: Excellent ELOS: 2 weeks    Today's Date: 03/29/2018 SLP Individual Time: 7902-4097 SLP Individual Time Calculation (min): 30 min   Problem List:  Patient Active Problem List   Diagnosis Date Noted  . Small vessel disease ([place]) 03/28/2018  . Diabetes mellitus type 2 in nonobese (HCC)   . Anxiety state   . Stage 3 chronic kidney disease ([place])   . Hyperlipidemia   . History of breast cancer   . Acute ischemic stroke ([place])   . TIA (transient ischemic attack) 03/24/2018  . Dyspnea on exertion 05/30/2017  . Sinus tachycardia 05/30/2017  . Mixed hyperlipidemia 05/30/2017  . Essential hypertension 05/30/2017   Past Medical History:  Past Medical History:  Diagnosis Date  . Anxiety   . Back ache   . Breast cancer ([place])    right  . Bronchitis   . Cancer of colon ([place])   . Depression   . DM (diabetes mellitus) ([place])   . Dyspnea on exertion   . Fall on steps    age 48 feel down iron stairs couldnot move leg for 6 mths  . Family history of primary TB   . Fracture of ankle, closed 2014   left  . History of chicken pox   . History of hysterectomy 04/1980  . History of primary TB   . Hyperlipidemia   . Hypertension   . Kidney disease, chronic, stage III (moderate, EGFR 30-59 ml/min) ([place]) 2017  . Kidney disorder    reduction function  . Kidney stones   . Knee pain, right   . Lung abnormality    age 23 patient had pna spot on lungs   . Microalbuminuria   . Muscle cramps    both legs  . Muscular degeneration    of right eye  . Nephrolithiasis   . Palpitations   . Personal history of radiation therapy    Past Surgical History:  Past Surgical History:  Procedure Laterality Date  . ABLATION  2007   fast heart rate  . ANKLE FRACTURE SURGERY Left 2014  . BREAST LUMPECTOMY Right    . CATARACT EXTRACTION Right 2017  . colonscopy  2015  . HYSTEROTOMY     45 years ago  . TONSILLECTOMY AND ADENOIDECTOMY     age 47    Assessment / Plan / Recommendation Clinical Impression [name] is a 77 year old right-handed female with history of tachycardia status post ablation 2007, diabetes mellitus, right breast cancer status post lumpectomy as well as colon cancer, CKD stage III.  Per chart review patient lives alone was independent prior to admission and driving.  Family does provide Doctor, hospital.  One level town home.  Son is a Field seismologist.  Presented 03/24/2018 with left-sided weakness and facial droop as well as slurred speech.  Cranial CT scan negative for acute changes.  Patient did receive TPA.  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  No significant proximal stenosis aneurysm or branch vessel occlusion.  Echocardiogram with ejection fraction of 35% grade 1 diastolic dysfunction.  Carotid Dopplers with no ICA stenosis.  Neurology consulted presently on aspirin and Plavix for CVA prophylaxis.  Subcutaneous heparin for DVT prophylaxis.  Patient is tolerating a regular diet.  Physical and occupational therapy evaluations completed with recommendations of physical medicine rehab consult.  Patient was admitted for a comprehensive rehab program  Pt presents with very mild higher level cognitive deficits as evidenced by decreased complex selective attention, perseveration of topics (however might be related to baseline anxiety disorder), complex problem solving and emergent/anticipatory awareness. Son present towards the end of evaluation with question regarding pt's "inability to speech effectively." He describes dysarthria d/t left facial weakness - not aphasia. Pt does present with very mild left facial weakness however her semi-complex speech is intelligible unfamiliar listener. Will introduce speech intelligibility strategies  and target briefly to decrease pt's anxiety. Do not anticipate that pt will require any follow up ST services at discharge.   Skilled Therapeutic Interventions          Skilled treatment session focused on completion of cognitive linguistic evaluation and education with son and pt on POC. All questions answered to satisfaction.    SLP Assessment  Patient will need skilled [facility] during CIR admission    Recommendations  Patient destination: Home Follow up Recommendations: None Equipment Recommended: None recommended by SLP    SLP Frequency 3 to 5 out of 7 days   SLP Duration  SLP Intensity  SLP Treatment/Interventions 2 weeks  Minumum of 1-2 x/day, 30 to 90 minutes  Cognitive remediation/compensation;Cueing hierarchy;Medication managment;Patient/family education    Pain Pain Assessment Pain Scale: 0-10 Pain Score: 0-No pain  Prior Functioning Cognitive/Linguistic Baseline: Within functional limits Type of Home: Apartment  Lives With: Alone Available Help at Discharge: Family;Available PRN/intermittently Vocation: Retired  Function:  Eating Eating   Modified Consistency Diet: No Eating Assist Level: Set up assist for   Eating Set Up Assist For: Opening containers       Cognition Comprehension Comprehension assist level: Understands complex 90% of the time/cues 10% of the time  Expression   Expression assist level: Expresses complex ideas: With extra time/assistive device  Social Interaction Social Interaction assist level: Interacts appropriately with others with medication or extra time (anti-anxiety, antidepressant).  Problem Solving Problem solving assist level: Solves complex 90% of the time/cues < 10% of the time  Memory Memory assist level: More than reasonable amount of time   Short Term Goals: Week 1: SLP Short Term Goal 1 (Week 1): Pt will utilize speech intelligibility strategies with Mod I to produce > 95% intelligible  speech at the complex conversation level.  SLP Short Term Goal 2 (Week 1): Pt will complete complex problem solving tasks with supervision cues.  SLP Short Term Goal 3 (Week 1): Pt will demonstrate selective attention in moderately distracting environment for ~ 45 minutes with supervision cues.  SLP Short Term Goal 4 (Week 1): Pt will demonstrate anticipatory awareness by listing safe vs. unsafe tasks with supervision cues.   Refer to Care Plan for Long Term Goals  Recommendations for other services: None   Discharge Criteria: Patient will be discharged from SLP if patient refuses treatment 3 consecutive times without medical reason, if treatment goals not met, if there is a change in medical status, if patient makes no progress towards goals or if patient is discharged from hospital.  The above assessment, treatment plan, treatment alternatives and goals were discussed and mutually agreed upon: by patient and by family  Berdena Cisek 03/29/2018, 3:21 PM

## 2018-03-29 NOTE — Progress Notes (Signed)
Speech Language Pathology Daily Session Note  Patient Details  Name: [name] MRN: [id number] Date of Birth: [birth year]  Today's Date: 03/29/2018 SLP Individual Time: 1330-1400 SLP Individual Time Calculation (min): 30 min  Short Term Goals: Week 1: SLP Short Term Goal 1 (Week 1): Pt will utilize speech intelligibility strategies with Mod I to produce > 95% intelligible speech at the complex conversation level.  SLP Short Term Goal 2 (Week 1): Pt will complete complex problem solving tasks with supervision cues.  SLP Short Term Goal 3 (Week 1): Pt will demonstrate selective attention in moderately distracting environment for ~ 45 minutes with supervision cues.  SLP Short Term Goal 4 (Week 1): Pt will demonstrate anticipatory awareness by listing safe vs. unsafe tasks with supervision cues.   Skilled Therapeutic Interventions: Skilled treatment session focused on cognition goals. SLP facilitated session by providing supervision questions to recall previous events from day. After initial question, pt able to events accurately with independence. Pt able to demonstrate selective attention with Min A cues for redirection - however daughter-in-law present and states that pt is a "talker" at baseline.      Function:  Eating Eating   Modified Consistency Diet: No Eating Assist Level: Set up assist for   Eating Set Up Assist For: Opening containers       Cognition Comprehension Comprehension assist level: Understands complex 90% of the time/cues 10% of the time  Expression   Expression assist level: Expresses complex ideas: With extra time/assistive device  Social Interaction Social Interaction assist level: Interacts appropriately with others with medication or extra time (anti-anxiety, antidepressant).  Problem Solving Problem solving assist level: Solves complex 90% of the time/cues < 10% of the time  Memory Memory assist level: More than reasonable amount of time    Pain Pain  Assessment Pain Scale: 0-10 Pain Score: 0-No pain  Therapy/Group: Individual Therapy  [name] 03/29/2018, 3:24 PM

## 2018-03-29 NOTE — Progress Notes (Signed)
Physical Therapy Assessment and Plan  Patient Details  Name: [name] MRN: [id number] Date of Birth: [birth year]  PT Diagnosis: Abnormality of gait, Difficulty walking, Hemiparesis non-dominant and Muscle weakness Rehab Potential: Good ELOS: 2-2.5 weeks   Today's Date: 03/29/2018 PT Individual Time: 1000-1100 PT Individual Time Calculation (min): 60 min    Problem List:  Patient Active Problem List   Diagnosis Date Noted  . Small vessel disease ([place]) 03/28/2018  . Diabetes mellitus type 2 in nonobese (HCC)   . Anxiety state   . Stage 3 chronic kidney disease ([place])   . Hyperlipidemia   . History of breast cancer   . Acute ischemic stroke ([place])   . TIA (transient ischemic attack) 03/24/2018  . Dyspnea on exertion 05/30/2017  . Sinus tachycardia 05/30/2017  . Mixed hyperlipidemia 05/30/2017  . Essential hypertension 05/30/2017    Past Medical History:  Past Medical History:  Diagnosis Date  . Anxiety   . Back ache   . Breast cancer ([place])    right  . Bronchitis   . Cancer of colon ([place])   . Depression   . DM (diabetes mellitus) ([place])   . Dyspnea on exertion   . Fall on steps    age 77 feel down iron stairs couldnot move leg for 6 mths  . Family history of primary TB   . Fracture of ankle, closed 2014   left  . History of chicken pox   . History of hysterectomy 04/1980  . History of primary TB   . Hyperlipidemia   . Hypertension   . Kidney disease, chronic, stage III (moderate, EGFR 30-59 ml/min) ([place]) 2017  . Kidney disorder    reduction function  . Kidney stones   . Knee pain, right   . Lung abnormality    age 77 patient had pna spot on lungs   . Microalbuminuria   . Muscle cramps    both legs  . Muscular degeneration    of right eye  . Nephrolithiasis   . Palpitations   . Personal history of radiation therapy    Past Surgical History:  Past Surgical History:  Procedure Laterality Date  . ABLATION  2007   fast heart rate  . ANKLE FRACTURE SURGERY  Left 2014  . BREAST LUMPECTOMY Right   . CATARACT EXTRACTION Right 2017  . colonscopy  2015  . HYSTEROTOMY     45 years ago  . TONSILLECTOMY AND ADENOIDECTOMY     age 6    Assessment & Plan Clinical Impression: Malavika Lira is a 77 year old right-handed female with history of tachycardia status post ablation 2007, diabetes mellitus, right breast cancer status post lumpectomy as well as colon cancer, CKD stage III.  Per chart review patient lives alone was independent prior to admission and driving.  Family does provide Doctor, hospital.  One level town home.  Son is a Field seismologist.  Presented 03/24/2018 with left-sided weakness and facial droop as well as slurred speech.  Cranial CT scan negative for acute changes.  Patient did receive TPA.  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  No significant proximal stenosis aneurysm or branch vessel occlusion.  Echocardiogram with ejection fraction of 24% grade 1 diastolic dysfunction.  Carotid Dopplers with no ICA stenosis.  Neurology consulted presently on aspirin and Plavix for CVA prophylaxis.  Subcutaneous heparin for DVT prophylaxis.  Patient is tolerating a regular diet.  Physical and occupational therapy evaluations completed with recommendations of  physical medicine rehab consult.  Patient was admitted for a comprehensive rehab program Patient transferred to CIR on 03/28/2018 .   Patient currently requires min with mobility secondary to muscle weakness, decreased cardiorespiratoy endurance, unbalanced muscle activation, ataxia and decreased coordination, decreased problem solving, decreased safety awareness and delayed processing and decreased sitting balance, decreased standing balance, decreased postural control, hemiplegia and decreased balance strategies.  Prior to hospitalization, patient was independent  with mobility and lived with Alone in a [place] home.  Home access is 3 steps, no  railsStairs to enter.  Patient will benefit from skilled PT intervention to maximize safe functional mobility, minimize fall risk and decrease caregiver burden for planned discharge home with 24 hour supervision.  Anticipate patient will benefit from follow up [place] at discharge.  PT - End of Session Activity Tolerance: Tolerates 30+ min activity with multiple rests Endurance Deficit: Yes Endurance Deficit Description: fatigues quickly with mobility activities PT Assessment Rehab Potential (ACUTE/IP ONLY): Good PT Barriers to Discharge: Decreased caregiver support;Home environment access/layout;Lack of/limited family support PT Patient demonstrates impairments in the following area(s): Balance;Endurance;Motor;Safety PT Transfers Functional Problem(s): Bed Mobility;Bed to Chair;Car;Furniture PT Locomotion Functional Problem(s): Ambulation;Wheelchair Mobility;Stairs PT Plan PT Intensity: Minimum of 1-2 x/day ,45 to 90 minutes PT Frequency: 5 out of 7 days PT Duration Estimated Length of Stay: 2-2.5 weeks PT Treatment/Interventions: Ambulation/gait training;Discharge planning;Functional mobility training;Psychosocial support;Therapeutic Activities;Visual/perceptual remediation/compensation;Wheelchair propulsion/positioning;Therapeutic Exercise;Skin care/wound management;Neuromuscular re-education;Balance/vestibular training;Disease management/prevention;Cognitive remediation/compensation;DME/adaptive equipment instruction;Pain management;Splinting/orthotics;UE/LE Coordination activities;UE/LE Strength taining/ROM;Stair training;Patient/family education;Community reintegration PT Transfers Anticipated Outcome(s): modI PT Locomotion Anticipated Outcome(s): modI with LRAD, S stairs for home entry PT Recommendation Recommendations for Other Services: Therapeutic Recreation consult Therapeutic Recreation Interventions: Kitchen group;Outing/community reintergration Follow Up Recommendations: Home health  PT Patient destination: Home Equipment Recommended: To be determined Equipment Details: no equipment  Skilled Therapeutic Intervention Pt received in bed, denies pain and agreeable to treatment. PT initial evaluation as below with minA overall, limited by L hemiparesis, mild L inattention, decreased aerobic endurance. Initiated gait training without AD with min guard/minA overall L HHA. Educated pt in rehab process, goals, estimated length of stay to be determined once all evaluations completed. Remained in w/c at end of session, all needs in reach.   PT Evaluation Precautions/Restrictions Precautions Precautions: Fall Precaution Comments: mild L inattention Restrictions Weight Bearing Restrictions: No General Chart Reviewed: Yes Response to Previous Treatment: Not applicable Family/Caregiver Present: No Vital SignsTherapy Vitals Pulse Rate: 94 BP: (!) 137/97 Pain Pain Assessment Pain Scale: 0-10 Pain Score: 0-No pain Home Living/Prior Functioning Home Living Available Help at Discharge: Family;Available PRN/intermittently Type of Home: Apartment Home Access: Stairs to enter Entrance Stairs-Number of Steps: 3 steps, no rails Entrance Stairs-Rails: None  Lives With: Alone Prior Function Level of Independence: Independent with basic ADLs;Independent with gait;Independent with homemaking with ambulation;Independent with transfers  Able to Take Stairs?: Yes Driving: Yes Vocation: Retired Comments: drove; medication Mudlogger; 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 Vision/Perception  Vision - Assessment Eye Alignment: Within Functional Limits Ocular Range of Motion: Within Functional Limits Alignment/Gaze Preference: Within Defined Limits Perception Comments: mild L inattention during functional tasks Praxis Praxis: Intact  Cognition Orientation Level: Oriented X4 Sensation Sensation Light Touch: Appears Intact Coordination Gross Motor Movements are Fluid and Coordinated:  No Fine Motor Movements are Fluid and Coordinated: No Finger Nose Finger Test: dysmetria LUE Heel Shin Test: impaired LLE, decreased smoothness/excursion Motor  Motor Motor: Hemiplegia Motor - Skilled Clinical Observations: L hemiparesis  Mobility Bed Mobility Bed Mobility: Sit to Supine;Supine to Sit Supine to  Sit: Supervision/Verbal cueing Sit to Supine: Supervision/Verbal cueing Transfers Transfers: Sit to Stand;Stand Pivot Transfers Sit to Stand: Minimal Assistance - Patient > 75% Stand Pivot Transfers: Minimal Assistance - Patient > 75% Transfer (Assistive device): Rolling walker Locomotion  Gait Ambulation: Yes Gait Assistance: Contact Guard/Touching assist;Minimal Assistance - Patient > 75% Gait Distance (Feet): 90 Feet Assistive device: Rolling walker Gait Assistance Details: Verbal cues for technique;Verbal cues for safe use of DME/AE;Verbal cues for precautions/safety Gait Gait: Yes Gait Pattern: Impaired Gait Pattern: Poor foot clearance - left;Narrow base of support;Decreased trunk rotation;Trunk flexed;Lateral hip instability;Right foot flat;Left foot flat;Decreased stride length Gait velocity: decreased Stairs / Additional Locomotion Stairs: Yes Stairs Assistance: Contact Guard/Touching assist Stair Management Technique: Two rails;Alternating pattern;Forwards Number of Stairs: 8 Height of Stairs: 3 Wheelchair Mobility Wheelchair Mobility: Yes Wheelchair Assistance: Chartered loss adjuster: Both upper extremities Wheelchair Parts Management: Needs assistance Distance: 65'  Trunk/Postural Assessment  Cervical Assessment Cervical Assessment: Within Functional Limits Thoracic Assessment Thoracic Assessment: Within Functional Limits Lumbar Assessment Lumbar Assessment: Within Functional Limits Postural Control Postural Control: Deficits on evaluation(impaired ankle/hip/stepping strategies)  Balance Balance Balance Assessed:  Yes Static Sitting Balance Static Sitting - Balance Support: No upper extremity supported Static Sitting - Level of Assistance: 6: Modified independent (Device/Increase time) Dynamic Sitting Balance Dynamic Sitting - Balance Support: No upper extremity supported;Feet supported;During functional activity Dynamic Sitting - Level of Assistance: 5: Stand by assistance Dynamic Sitting - Balance Activities: Lateral lean/weight shifting;Forward lean/weight shifting;Reaching for objects Static Standing Balance Static Standing - Balance Support: No upper extremity supported Static Standing - Level of Assistance: 5: Stand by assistance Dynamic Standing Balance Dynamic Standing - Balance Support: No upper extremity supported;During functional activity Dynamic Standing - Level of Assistance: 4: Min assist Dynamic Standing - Balance Activities: Forward lean/weight shifting;Reaching for objects Extremity Assessment  RUE Assessment RUE Assessment: Within Functional Limits  LUE: grossly impaired but [facility] for mobility tasks assessed; defer to OT evaluation RLE Assessment RLE Assessment: Within Functional Limits LLE Assessment LLE Assessment: Exceptions to [facility] General Strength Comments: hip flexion 4-/5, knee flexion/extension 4/5, ankle dorsiflexion 4/5, ankle plantarflexion 4+/5   See Function Navigator for Current Functional Status.   Refer to Care Plan for Long Term Goals  Recommendations for other services: Therapeutic Recreation  Kitchen group and Outing/community reintegration  Discharge Criteria: Patient will be discharged from PT if patient refuses treatment 3 consecutive times without medical reason, if treatment goals not met, if there is a change in medical status, if patient makes no progress towards goals or if patient is discharged from hospital.  The above assessment, treatment plan, treatment alternatives and goals were discussed and mutually agreed upon: by patient  Corliss Skains 03/29/2018, 12:16 PM

## 2018-03-29 NOTE — Evaluation (Signed)
Occupational Therapy Assessment and Plan  Patient Details  Name: [name] MRN: [id number] Date of Birth: May 21, 1941  OT Diagnosis: cognitive deficits, disturbance of vision, hemiplegia affecting non-dominant side and muscle weakness (generalized) Rehab Potential: Rehab Potential (ACUTE ONLY): Good ELOS: 14-16 days   Today's Date: 03/29/2018 OT Individual Time: 1433-1530 OT Individual Time Calculation (min): 57 min     Problem List:  Patient Active Problem List   Diagnosis Date Noted  . Small vessel disease ([place]) 03/28/2018  . Diabetes mellitus type 2 in nonobese (HCC)   . Anxiety state   . Stage 3 chronic kidney disease ([place])   . Hyperlipidemia   . History of breast cancer   . Acute ischemic stroke ([place])   . TIA (transient ischemic attack) 03/24/2018  . Dyspnea on exertion 05/30/2017  . Sinus tachycardia 05/30/2017  . Mixed hyperlipidemia 05/30/2017  . Essential hypertension 05/30/2017    Past Medical History:  Past Medical History:  Diagnosis Date  . Anxiety   . Back ache   . Breast cancer ([place])    right  . Bronchitis   . Cancer of colon ([place])   . Depression   . DM (diabetes mellitus) ([place])   . Dyspnea on exertion   . Fall on steps    age 77 feel down iron stairs couldnot move leg for 6 mths  . Family history of primary TB   . Fracture of ankle, closed 2014   left  . History of chicken pox   . History of hysterectomy 04/1980  . History of primary TB   . Hyperlipidemia   . Hypertension   . Kidney disease, chronic, stage III (moderate, EGFR 30-59 ml/min) ([place]) 2017  . Kidney disorder    reduction function  . Kidney stones   . Knee pain, right   . Lung abnormality    age 56 patient had pna spot on lungs   . Microalbuminuria   . Muscle cramps    both legs  . Muscular degeneration    of right eye  . Nephrolithiasis   . Palpitations   . Personal history of radiation therapy    Past Surgical History:  Past Surgical History:  Procedure Laterality Date   . ABLATION  2007   fast heart rate  . ANKLE FRACTURE SURGERY Left 2014  . BREAST LUMPECTOMY Right   . CATARACT EXTRACTION Right 2017  . colonscopy  2015  . HYSTEROTOMY     45 years ago  . TONSILLECTOMY AND ADENOIDECTOMY     age 80    Assessment & Plan Clinical Impression: Patient is a 77 y.o. right-handed female with history of tachycardia status post ablation 2007, diabetes mellitus, right breast cancer status post lumpectomy as well as colon cancer, CKD stage III.  Per chart review patient lives alone was independent prior to admission and driving.  Family does provide Doctor, hospital.  One level town home.  Son is a Field seismologist.  Presented 03/24/2018 with left-sided weakness and facial droop as well as slurred speech.  Cranial CT scan negative for acute changes.  Patient did receive TPA.  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  No significant proximal stenosis aneurysm or branch vessel occlusion.  Echocardiogram with ejection fraction of 03% grade 1 diastolic dysfunction.  Carotid Dopplers with no ICA stenosis.  Neurology consulted presently on aspirin and Plavix for CVA prophylaxis.  Subcutaneous heparin for DVT prophylaxis.  Patient is tolerating a regular diet.  Physical  and occupational therapy evaluations completed with recommendations of physical medicine rehab consult.  Patient was admitted for a comprehensive rehab program.   Patient transferred to CIR on 03/28/2018 .    Patient currently requires min with basic self-care skills secondary to muscle weakness, decreased cardiorespiratoy endurance, impaired timing and sequencing and decreased coordination, decreased visual perceptual skills, decreased attention to left, decreased awareness, decreased problem solving, decreased safety awareness and decreased memory and decreased standing balance and hemiplegia.  Prior to hospitalization, patient could complete ADLs and IADLs with  independent .  Patient will benefit from skilled intervention to increase independence with basic self-care skills prior to discharge home independently.  Anticipate patient will require intermittent supervision and follow up home health.  OT - End of Session Activity Tolerance: Tolerates 30+ min activity with multiple rests Endurance Deficit: Yes Endurance Deficit Description: fatigues quickly with self-care activities OT Assessment Rehab Potential (ACUTE ONLY): Good OT Barriers to Discharge: Decreased caregiver support OT Barriers to Discharge Comments: family can only provide intermittent assist OT Patient demonstrates impairments in the following area(s): Balance;Cognition;Endurance;Motor;Perception;Safety;Vision OT Basic ADL's Functional Problem(s): Grooming;Bathing;Dressing;Toileting OT Advanced ADL's Functional Problem(s): Simple Meal Preparation OT Transfers Functional Problem(s): Toilet;Tub/Shower OT Additional Impairment(s): Fuctional Use of Upper Extremity OT Plan OT Intensity: Minimum of 1-2 x/day, 45 to 90 minutes OT Frequency: 5 out of 7 days OT Duration/Estimated Length of Stay: 14-16 days OT Treatment/Interventions: Balance/vestibular training;Cognitive remediation/compensation;Community reintegration;Discharge planning;Disease mangement/prevention;DME/adaptive equipment instruction;Functional mobility training;Neuromuscular re-education;Pain management;Patient/family education;Psychosocial support;Self Care/advanced ADL retraining;Therapeutic Activities;Therapeutic Exercise;UE/LE Strength taining/ROM;UE/LE Coordination activities;Visual/perceptual remediation/compensation OT Basic Self-Care Anticipated Outcome(s): Mod I OT Toileting Anticipated Outcome(s): Mod I OT Bathroom Transfers Anticipated Outcome(s): Mod I OT Recommendation Recommendations for Other Services: Therapeutic Recreation consult Therapeutic Recreation Interventions: Kitchen group;Outing/community  reintergration Patient destination: Home Follow Up Recommendations: Home health OT Equipment Recommended: Tub/shower bench   Skilled Therapeutic Intervention OT eval completed with discussion of rehab process, OT purpose, POC, ELOS, and goals.  ADL assessment completed with bathroom transfers and dynamic standing during toileting tasks.  Pt declined bathing and dressing at this time due to fatigue from therapies and expressing need to have BM.  Ambulated to toilet with RW with min assist.  Pt required assistance for hygiene post BM for thoroughness.  Engaged in grooming tasks in standing at sink with min/contact guard for standing balance.  Completed 9 hole peg test with Rt: 39 seconds and Lt: placing 5 pegs in 2 min time limit (completing placement of all 9 in 2:53 and completion of total task in 3:15).  Pt returned to supine in bed at end of session and left with all needs in reach.  OT Evaluation Precautions/Restrictions  Precautions Precautions: Fall Precaution Comments: mild L inattention General   Vital Signs Therapy Vitals Temp: 66 F (36.7 C) Temp Source: Oral Pulse Rate: 88 Resp: 18 BP: 124/72 Patient Position (if appropriate): Lying Oxygen Therapy SpO2: 100 % O2 Device: Room Air Pain Pain Assessment Pain Scale: 0-10 Pain Score: 0-No pain Home Living/Prior Functioning Home Living Family/patient expects to be discharged to:: Private residence Available Help at Discharge: Family, Available PRN/intermittently Type of Home: Apartment Home Access: Stairs to enter [employer] of Steps: 3 steps, family is putting in a Gaffer: None Home Layout: One level Bathroom Shower/Tub: Public librarian, Architectural technologist: Programmer, systems: Yes  Lives With: Alone IADL History Homemaking Responsibilities: Yes Meal Prep Responsibility: Primary Laundry Responsibility: Primary Cleaning Responsibility: Primary Shopping  Responsibility: Primary Prior Function Level of Independence: Independent with basic  ADLs, Independent with gait, Independent with homemaking with ambulation, Independent with transfers  Able to Take Stairs?: Yes Driving: Yes Vocation: Retired Comments: drove; medication Mudlogger; 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 mangement ADL  See Function Navigator Vision Baseline Vision/History: Wears glasses;Macular Degeneration(in Rt eye) Wears Glasses: Reading only Patient Visual Report: No change from baseline(reports due for a "checkup") Vision Assessment?: Yes Eye Alignment: Within Functional Limits Ocular Range of Motion: Within Functional Limits Alignment/Gaze Preference: Within Defined Limits Tracking/Visual Pursuits: Decreased smoothness of horizontal tracking;Decreased smoothness of vertical tracking Saccades: Additional head turns occurred during testing Convergence: Within functional limits Visual Fields: No apparent deficits Perception  Comments: mild L inattention during functional tasks Praxis Praxis: Intact Cognition Overall Cognitive Status: Impaired/Different from baseline Arousal/Alertness: Awake/alert Orientation Level: Person;Place;Situation Person: Oriented Place: Oriented Situation: Oriented Year: 2019 Month: September Day of Week: Correct Memory: Appears intact Immediate Memory Recall: Sock;Blue;Bed Memory Recall: Blue;Bed;Sock Memory Recall Sock: Without Cue Memory Recall Blue: Without Cue Memory Recall Bed: Without Cue Attention: Selective Selective Attention: Impaired Selective Attention Impairment: Verbal complex;Functional complex Awareness: Impaired Awareness Impairment: Emergent impairment;Anticipatory impairment Problem Solving: Impaired Problem Solving Impairment: Verbal complex;Functional complex Executive Function: Reasoning Reasoning: Impaired Reasoning Impairment: Verbal complex;Functional complex Safety/Judgment:  Impaired Sensation Sensation Light Touch: Appears Intact Proprioception: Appears Intact Coordination Gross Motor Movements are Fluid and Coordinated: No Fine Motor Movements are Fluid and Coordinated: No Finger Nose Finger Test: dysmetria LUE  Trunk/Postural Assessment  Cervical Assessment Cervical Assessment: Within Functional Limits Thoracic Assessment Thoracic Assessment: Within Functional Limits Lumbar Assessment Lumbar Assessment: Within Functional Limits Postural Control Postural Control: Deficits on evaluation(impaired ankle/hip/stepping strategies)  Balance Balance Balance Assessed: Yes Static Sitting Balance Static Sitting - Balance Support: No upper extremity supported Static Sitting - Level of Assistance: 6: Modified independent (Device/Increase time) Dynamic Sitting Balance Dynamic Sitting - Balance Support: No upper extremity supported;Feet supported;During functional activity Dynamic Sitting - Level of Assistance: 5: Stand by assistance Dynamic Sitting - Balance Activities: Lateral lean/weight shifting;Forward lean/weight shifting;Reaching for objects Static Standing Balance Static Standing - Balance Support: No upper extremity supported Static Standing - Level of Assistance: 5: Stand by assistance Dynamic Standing Balance Dynamic Standing - Balance Support: No upper extremity supported;During functional activity Dynamic Standing - Level of Assistance: 4: Min assist Dynamic Standing - Balance Activities: Forward lean/weight shifting;Reaching for objects Extremity/Trunk Assessment RUE Assessment RUE Assessment: Within Functional Limits LUE Assessment LUE Assessment: Exceptions to [facility] Active Range of Motion (AROM) Comments: shoulder flexion grossly 150*, decreased elbow extension General Strength Comments: grossly 4-/5 LUE Body System: Neuro Brunstrum levels for arm and hand: Arm;Hand Brunstrum level for arm: Stage V Relative Independence from  Synergy Brunstrum level for hand: Stage IV Movements deviating from synergies   See Function Navigator for Current Functional Status.   Refer to Care Plan for Long Term Goals  Recommendations for other services: Therapeutic Recreation  Kitchen group and Outing/community reintegration   Discharge Criteria: Patient will be discharged from OT if patient refuses treatment 3 consecutive times without medical reason, if treatment goals not met, if there is a change in medical status, if patient makes no progress towards goals or if patient is discharged from hospital.  The above assessment, treatment plan, treatment alternatives and goals were discussed and mutually agreed upon: by patient  Simonne Come 03/29/2018, 3:58 PM

## 2018-03-30 ENCOUNTER — Inpatient Hospital Stay (HOSPITAL_COMMUNITY): Payer: Medicare Other | Admitting: Physical Therapy

## 2018-03-30 ENCOUNTER — Inpatient Hospital Stay (HOSPITAL_COMMUNITY): Payer: Medicare Other | Admitting: Speech Pathology

## 2018-03-30 ENCOUNTER — Inpatient Hospital Stay (HOSPITAL_COMMUNITY): Payer: Medicare Other | Admitting: Occupational Therapy

## 2018-03-30 ENCOUNTER — Inpatient Hospital Stay (HOSPITAL_COMMUNITY): Payer: Medicare Other

## 2018-03-30 LAB — GLUCOSE, CAPILLARY
Glucose-Capillary: 174 mg/dL — ABNORMAL HIGH (ref 70–99)
Glucose-Capillary: 198 mg/dL — ABNORMAL HIGH (ref 70–99)
Glucose-Capillary: 171 mg/dL — ABNORMAL HIGH (ref 70–99)

## 2018-03-30 MED ORDER — LIVING WELL WITH DIABETES BOOK
Freq: Once | Status: AC
  Administered 2018-03-30: 12:00:00
  Filled 2018-03-30: qty 1

## 2018-03-30 NOTE — Progress Notes (Signed)
Physical Therapy Session Note  Patient Details  Name: [name] MRN: [id number] Date of Birth: 17-Jan-1941  Today's Date: 03/30/2018 PT Individual Time: 1100-1200 PT Individual Time Calculation (min): 60 min   Short Term Goals: Week 1:  PT Short Term Goal 1 (Week 1): Pt will perform bed mobility with S from flat bed PT Short Term Goal 2 (Week 1): Pt will perform transfers with consistent S PT Short Term Goal 3 (Week 1): Pt will ambulate x150' with LRAD and S PT Short Term Goal 4 (Week 1): Pt will ascend/descend 3 steps no rails with minA  Skilled Therapeutic Interventions/Progress Updates:    Pt received seated in w/c in room, agreeable to PT. No complaints of pain. Sit to stand with min A to RW, v/c for safe setup for transfer and for forward weight shift. Ambulation 2 x 60 ft with RW and min A, decreased LLE clearance and some path deviation to the R. Trial amb with R HR and hand-held assist, min A with decreased BOS, increased ataxia with gait, and step-to rather than step-through gait pattern. Standing LLE steps fwd/back with focus on heel strike with each step. Sit to stand 2 x 5 reps with focus on forward weight shift and hand placement during transfer. Standing alt L/R 4" step-taps with RW and min A for support, 3 x 5-10 reps to fatigue. Pt left seated in w/c in room with needs in reach and chair alarm in place at end of therapy session.  Therapy Documentation Precautions:  Precautions Precautions: Fall Precaution Comments: mild L inattention Restrictions Weight Bearing Restrictions: No  See Function Navigator for Current Functional Status.   Therapy/Group: Individual Therapy  Excell Seltzer, PT, DPT  03/30/2018, 12:25 PM

## 2018-03-30 NOTE — Progress Notes (Signed)
Occupational Therapy Session Note  Patient Details  Name: [name] MRN: [id number] Date of Birth: 07-May-1941  Today's Date: 03/30/2018 OT Individual Time: 0905-1002 OT Individual Time Calculation (min): 57 min    Short Term Goals: Week 1:  OT Short Term Goal 1 (Week 1): Pt will complete bathing with min assist at sit > stand level OT Short Term Goal 2 (Week 1): Pt will complete LB dressing with min assist OT Short Term Goal 3 (Week 1): Pt will complete 2 grooming tasks in standing with contact guard for increased activity tolerance  Skilled Therapeutic Interventions/Progress Updates:    Treatment session with focus on ADL retraining and dynamic sitting and standing balance.  Pt received with RN with pt toileting.  Pt ambulated to tub bench in room shower post toileting with min assist and min cues for transfer technique.  Engaged in bathing at sit > stand level in room shower with min cues to incorporate LUE into bathing.  Pt initially stating that she would not be able to do a lot as her Lt hand does not work and then would repeat how pleased and surprised she was about what she was able to accomplish with Lt hand.  Pt attempted donning pants in figure 4 position and was able to maintain position with RLE over Lt knee but was unable to thread pant leg or don sock in this position.  Min assist for standing balance while pt attempted to pull pants over hips, however required assistance due to fatigue and instability.  Pt left upright in w/c with call bell in reach and chair alarm on.  Therapy Documentation Precautions:  Precautions Precautions: Fall Precaution Comments: mild L inattention Restrictions Weight Bearing Restrictions: No Pain: Pain Assessment Pain Scale: 0-10 Pain Score: 0-No pain  See Function Navigator for Current Functional Status.   Therapy/Group: Individual Therapy  Cutberto Winfree, [place] 03/30/2018, 12:12 PM

## 2018-03-30 NOTE — Progress Notes (Signed)
Physical Therapy Session Note  Patient Details  Name: [name] MRN: [id number] Date of Birth: 1941/04/23  Today's Date: 03/30/2018 PT Individual Time: 6701-1003 PT Individual Time Calculation (min): 30 min   Short Term Goals: Week 1:  PT Short Term Goal 1 (Week 1): Pt will perform bed mobility with S from flat bed PT Short Term Goal 2 (Week 1): Pt will perform transfers with consistent S PT Short Term Goal 3 (Week 1): Pt will ambulate x150' with LRAD and S PT Short Term Goal 4 (Week 1): Pt will ascend/descend 3 steps no rails with minA  Skilled Therapeutic Interventions/Progress Updates:   neuromuscular re-education via forced use, multimodal cues for 10 x 1 in standing-  mini squats, bil calf raises; R/L toe raises with assistance for L knee stability.  15 x 1 seated-  Heel raises, toes raises, R/L alternating toe raises. Pt required seated rest breaks between each bout.  Pt c/o R shoulder tightness.  Pt performed 10 x 1 R shoulder backward circles and bil shoulder adduction.  Pt reported R shoulder felt looser and more comfortable after exercises.   Reciprocal scooting in w/c forward/backward to promote pelvic dissociation.  Pt left resting in w/c with needs at hand and seat alarm set.    Therapy Documentation Precautions:  Precautions Precautions: Fall Precaution Comments: mild L inattention Restrictions Weight Bearing Restrictions: No  Pain: pt denies      See Function Navigator for Current Functional Status.   Therapy/Group: Individual Therapy  Mccormick Macon 03/30/2018, 4:27 PM

## 2018-03-30 NOTE — Progress Notes (Addendum)
Subjective/Complaints:  Pt working with SLP, pt doing well with complex calendar scheduling task but pt doesn't  Feel like she did well with this task ROS  Denies CP, SOB, N/V/D  Objective: Vital Signs: Blood pressure 135/68, pulse 92, temperature 99 F (37.2 C), temperature source Oral, resp. rate 18, height 5' 7" (1.702 m), weight 85 kg, SpO2 100 %. No results found. Results for orders placed or performed during the hospital encounter of 03/28/18 (from the past 72 hour(s))  CBC     Status: Abnormal   Collection Time: 03/28/18  5:32 AM  Result Value Ref Range   WBC 6.0 4.0 - 10.5 K/uL   RBC 3.72 (L) 3.87 - 5.11 MIL/uL   Hemoglobin 11.7 (L) 12.0 - 15.0 g/dL   HCT 36.1 36.0 - 46.0 %   MCV 97.0 78.0 - 100.0 fL   MCH 31.5 26.0 - 34.0 pg   MCHC 32.4 30.0 - 36.0 g/dL   RDW 13.1 11.5 - 15.5 %   Platelets 178 150 - 400 K/uL    Comment: Performed at [facility], [place] [address] [place], [place] [address]  Glucose, capillary     Status: Abnormal   Collection Time: 03/28/18  5:51 PM  Result Value Ref Range   Glucose-Capillary 188 (H) 70 - 99 mg/dL   Comment 1 Document in Chart   Glucose, capillary     Status: Abnormal   Collection Time: 03/28/18  9:07 PM  Result Value Ref Range   Glucose-Capillary 223 (H) 70 - 99 mg/dL  CBC WITH DIFFERENTIAL     Status: Abnormal   Collection Time: 03/29/18  5:29 AM  Result Value Ref Range   WBC 6.8 4.0 - 10.5 K/uL   RBC 3.77 (L) 3.87 - 5.11 MIL/uL   Hemoglobin 11.9 (L) 12.0 - 15.0 g/dL   HCT 36.2 36.0 - 46.0 %   MCV 96.0 78.0 - 100.0 fL   MCH 31.6 26.0 - 34.0 pg   MCHC 32.9 30.0 - 36.0 g/dL   RDW 13.2 11.5 - 15.5 %   Platelets 207 150 - 400 K/uL   Neutrophils Relative % 67 %   Neutro Abs 4.5 1.7 - 7.7 K/uL   Lymphocytes Relative 22 %   Lymphs Abs 1.5 0.7 - 4.0 K/uL   Monocytes Relative 8 %   Monocytes Absolute 0.6 0.1 - 1.0 K/uL   Eosinophils Relative 3 %   Eosinophils Absolute 0.2 0.0 - 0.7 K/uL   Basophils Relative 0 %    Basophils Absolute 0.0 0.0 - 0.1 K/uL   Immature Granulocytes 0 %   Abs Immature Granulocytes 0.0 0.0 - 0.1 K/uL    Comment: Performed at [facility], [address] [place], [place] [address]  Comprehensive metabolic panel     Status: Abnormal   Collection Time: 03/29/18  5:29 AM  Result Value Ref Range   Sodium 137 135 - 145 mmol/L   Potassium 4.4 3.5 - 5.1 mmol/L   Chloride 105 98 - 111 mmol/L   CO2 23 22 - 32 mmol/L   Glucose, Bld 208 (H) 70 - 99 mg/dL   BUN 35 (H) 8 - 23 mg/dL   Creatinine, Ser 1.92 (H) 0.44 - 1.00 mg/dL   Calcium 10.2 8.9 - 10.3 mg/dL   Total Protein 6.3 (L) 6.5 - 8.1 g/dL   Albumin 3.3 (L) 3.5 - 5.0 g/dL   AST 38 15 - 41 U/L   ALT 46 (H) 0 - 44 U/L  Alkaline Phosphatase 82 38 - 126 U/L   Total Bilirubin 0.4 0.3 - 1.2 mg/dL   GFR calc non Af Amer 24 (L) >60 mL/min   GFR calc Af Amer 28 (L) >60 mL/min    Comment: (NOTE) The eGFR has been calculated using the CKD EPI equation. This calculation has not been validated in all clinical situations. eGFR's persistently <60 mL/min signify possible Chronic Kidney Disease.    Anion gap 9 5 - 15    Comment: Performed at [facility], [address] [place], [place] [address]  Glucose, capillary     Status: Abnormal   Collection Time: 03/29/18  7:03 AM  Result Value Ref Range   Glucose-Capillary 195 (H) 70 - 99 mg/dL   Comment 1 Notify RN   Glucose, capillary     Status: Abnormal   Collection Time: 03/29/18 11:43 AM  Result Value Ref Range   Glucose-Capillary 210 (H) 70 - 99 mg/dL  Glucose, capillary     Status: Abnormal   Collection Time: 03/29/18  5:15 PM  Result Value Ref Range   Glucose-Capillary 184 (H) 70 - 99 mg/dL  Glucose, capillary     Status: Abnormal   Collection Time: 03/29/18  9:01 PM  Result Value Ref Range   Glucose-Capillary 162 (H) 70 - 99 mg/dL  Glucose, capillary     Status: Abnormal   Collection Time: 03/30/18  6:41 AM  Result Value Ref Range   Glucose-Capillary 174 (H)  70 - 99 mg/dL     HEENT: normal Cardio: RRR and no murmur Resp: CTA B/L and unlabored GI: BS positive and NT, ND Extremity:  No Edema Skin:   Intact and Other IV site CDI Neuro: Alert/Oriented, Normal Sensory, Abnormal Motor 4= Left delt bi tri grip HF, KE 3- L ankle DF, Abnormal FMC Ataxic/ dec FMC and Dysarthric Musc/Skel:  Other RIght sided neck pain with ROM Gen NAD   Assessment/Plan: 1. 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 Physiatrist is providing close team supervision and 24 hour management of active medical problems listed below. Physiatrist and rehab team continue to assess barriers to discharge/monitor patient progress toward functional and medical goals. FIM:       Function - Toileting Toileting steps completed by patient: Adjust clothing prior to toileting, Performs perineal hygiene, Adjust clothing after toileting Toileting steps completed by helper: Performs perineal hygiene Toileting Assistive Devices: Grab bar or rail Assist level: Supervision or verbal cues  Function - Toilet Transfers Toilet transfer assistive device: Grab bar, Walker Assist level to toilet: Supervision or verbal cues Assist level from toilet: Supervision or verbal cues  Function - Chair/bed transfer Chair/bed transfer method: Stand pivot Chair/bed transfer assist level: Touching or steadying assistance (Pt > 75%) Chair/bed transfer assistive device: Armrests, Walker Chair/bed transfer details: Verbal cues for sequencing, Tactile cues for weight shifting, Tactile cues for placement, Tactile cues for posture, Verbal cues for safe use of DME/AE, Verbal cues for precautions/safety  Function - Locomotion: Wheelchair Will patient use wheelchair at discharge?: No Type: Manual Max wheelchair distance: 75 Assist Level: Supervision or verbal  cues Assist Level: Supervision or verbal cues Wheel 150 feet activity did not occur: Safety/medical concerns(fatigue) Turns around,maneuvers to table,bed, and toilet,negotiates 3% grade,maneuvers on rugs and over doorsills: No Function - Locomotion: Ambulation Assistive device: Walker-rolling Max distance: 100 ft Assist level: Touching or steadying assistance (Pt > 75%)   Assist level: Touching or steadying assistance (Pt > 75%) Assist level: Touching or steadying assistance (Pt > 75%) Walk 150 feet activity did not occur: Safety/medical concerns(fatigue) Walk 10 feet on uneven surfaces activity did not occur: Safety/medical concerns Assist level: Touching or steadying assistance (Pt > 75%)  Function - Comprehension Comprehension: Auditory Comprehension assist level: Follows basic conversation/direction with extra time/assistive device  Function - Expression Expression: Verbal Expression assist level: Expresses basic needs/ideas: With extra time/assistive device  Function - Social Interaction Social Interaction assist level: Interacts appropriately with others with medication or extra time (anti-anxiety, antidepressant).  Function - Problem Solving Problem solving assist level: Solves basic problems with no assist  Function - Memory Memory assist level: More than reasonable amount of time Patient normally able to recall (first 3 days only): Current season, Location of own room, Staff names and faces, That he or she is in a hospital  Medical Problem List and Plan: 1.  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  DVT Prophylaxis/Anticoagulation: Subcutaneous heparin, no Lovenox due to Cr Cl 3. Pain Management: Tylenol as needed  4. Mood: Remeron 15 mg nightly, Effexor or 75 mg every other day, Ativan 0.4 mg 4 times  daily, Abilify 5 mg daily Dr Sima Matas to follow 5. Neuropsych: This patient is capable of making decisions on her own behalf. 6. Skin/Wound Care: Routine skin checks 7. Fluids/Electrolytes/Nutrition: Routine in and outs with follow-up chemistries 8.  Diabetes mellitus.  Hemoglobin A1c 8.6.  NovoLog 70/30 10 units twice daily.  Check blood sugars before meals and at bedtime CBG (last 3)  Recent Labs    03/29/18 1715 03/29/18 2101 03/30/18 0641  GLUCAP 184* 162* 174*  CBG controlled 9/5 9.  History of tachycardia with ablation 2007.  No chest pain or shortness of breath. 10.  CKD stage III.  Baseline creatinine 1.95.  Follow-up chemistries Creat is baseline BUN mildly elevated, Cr Cl <30 11.  History of breast cancer with lumpectomy.  Follow-up outpatient 12.  Hyperlipidemia.  Lipitor  LOS (Days) 2 A FACE TO FACE EVALUATION WAS PERFORMED  Charlett Blake 03/30/2018, 8:00 AM

## 2018-03-30 NOTE — Progress Notes (Signed)
Social Work Assessment and Plan  Patient Details  Name: [name] MRN: [id number] Date of Birth: 03/16/1941  Today's Date: 03/29/2018  Problem List:  Patient Active Problem List   Diagnosis Date Noted  . Small vessel disease ([place]) 03/28/2018  . Diabetes mellitus type 2 in nonobese (HCC)   . Anxiety state   . Stage 3 chronic kidney disease ([place])   . Hyperlipidemia   . History of breast cancer   . Acute ischemic stroke ([place])   . TIA (transient ischemic attack) 03/24/2018  . Dyspnea on exertion 05/30/2017  . Sinus tachycardia 05/30/2017  . Mixed hyperlipidemia 05/30/2017  . Essential hypertension 05/30/2017   Past Medical History:  Past Medical History:  Diagnosis Date  . Anxiety   . Back ache   . Breast cancer ([place])    right  . Bronchitis   . Cancer of colon ([place])   . Depression   . DM (diabetes mellitus) ([place])   . Dyspnea on exertion   . Fall on steps    age 77 feel down iron stairs couldnot move leg for 6 mths  . Family history of primary TB   . Fracture of ankle, closed 2014   left  . History of chicken pox   . History of hysterectomy 04/1980  . History of primary TB   . Hyperlipidemia   . Hypertension   . Kidney disease, chronic, stage III (moderate, EGFR 30-59 ml/min) ([place]) 2017  . Kidney disorder    reduction function  . Kidney stones   . Knee pain, right   . Lung abnormality    age 77 patient had pna spot on lungs   . Microalbuminuria   . Muscle cramps    both legs  . Muscular degeneration    of right eye  . Nephrolithiasis   . Palpitations   . Personal history of radiation therapy    Past Surgical History:  Past Surgical History:  Procedure Laterality Date  . ABLATION  2007   fast heart rate  . ANKLE FRACTURE SURGERY Left 2014  . BREAST LUMPECTOMY Right   . CATARACT EXTRACTION Right 2017  . colonscopy  2015  . HYSTEROTOMY     45 years ago  . TONSILLECTOMY AND ADENOIDECTOMY     age 77   Social History:  reports that she has never  smoked. She has never used smokeless tobacco. She reports that she does not drink alcohol or use drugs.  Family / Support Systems Marital Status: Divorced How Long?: 32 years Patient Roles: Parent, Other (Comment)(grandmother; friend) Children: [name] - son - [phone]; another son in [place] Anticipated Caregiver: family and friends Ability/Limitations of Caregiver: friends could provide intermittant supervision as needed Caregiver Availability: Intermittent Family Dynamics: close, supportive family  Social History Preferred language: English Religion:  Read: Yes Write: Yes Employment Status: Disabled Date Retired/Disabled/Unemployed: many years ago per pt Legal History/Current Legal Issues: none reported Guardian/Conservator: N/A - MD has determined that pt is capable of making her own decisions.  Pt's chart lists that pt has a legal guardian, but pt is not sure why that is on her chart, as she does not have a legal guardian.  CSW working to have this removed per pt's request.   Abuse/Neglect Abuse/Neglect Assessment Can Be Completed: Yes Physical Abuse: Denies Verbal Abuse: Denies Sexual Abuse: Denies Exploitation of patient/patient's resources: Denies Self-Neglect: Denies  Emotional Status Pt's affect, behavior and adjustment status: Pt was in good spirits and motivated to rehabilitate.  She reports that she still gets stressed at times, but her medications keep her more even and feeling better. Recent Psychosocial Issues: Pt moved to [place] 2 years ago to be closer to son and his family (3 grandchildren). Psychiatric History: Pt with a history of anxiety and depression - on 4 medications. Substance Abuse History: none reported  Patient / Family Perceptions, Expectations & Goals Pt/Family understanding of illness & functional limitations: Pt/son have a good understanding of pt's condition and limitations.   Premorbid pt/family roles/activities: Pt spends time with  grandchildren, reads, watches TV, and sings. Anticipated changes in roles/activities/participation: Pt would like to resume activities as she is able. Pt/family expectations/goals: Pt wants "to get back to my old self."  [employer]: None Premorbid Home Care/DME Agencies: None(Pt had [facility] in the past in [place].  Also stayed at a SNF for a short time to rehabilitate from a broken ankle.) Transportation available at discharge: family Resource referrals recommended: Neuropsychology, Support group (specify)(Stroke Support Group)  Discharge Planning Living Arrangements: Alone Support Systems: Children, Other relatives, Friends/neighbors Type of Residence: Private residence Insurance Resources: Multimedia programmer (specify)([employer] Medicare) Financial Resources: Social Security Financial Screen Referred: No Money Management: Family(son and his wife take care of this for pt) Does the patient have any problems obtaining your medications?: No Home Management: Pt was maintaining her own home.  Family will assist as needed. Patient/Family Preliminary Plans: Pt plans to return to her home and have intermittent help, as needed.  She wants to get to where she can be independent most of the time and not need 24/7 assistance. Social Work Anticipated Follow Up Needs: HH/OP, Support Group Expected length of stay: 2 to 2 1/2 weeks  Clinical Impression CSW met with pt to introduce self and role of CSW, as well as to complete assessment.  Later when CSW went back to see pt after team conference to update her, was able to meet pt's son, as well.  They were accepting of team wanting to work with pt 2 to 2 1/2 weeks to get her to independent.  Pt has done therapy in the past and knows what to expect.  She had some DME, but did not move it to [place] with her from [place].  It may have been more than 5 years, so CSW explained we would run any DME she needs through insurance and see what may be  covered.  Pt expressed understanding.  Pt's friends/family an assist intermittently, but no plans in place at this time for 24/7 assistance, unless needed.  CSW will continue to follow and assist as needed.  [name], Silvestre Mesi 03/30/2018, 11:25 AM

## 2018-03-30 NOTE — Progress Notes (Signed)
Inpatient [facility] Individual Statement of Services  Patient Name:  [name]  Date:  03/30/2018  Welcome to the [facility].  Our goal is to provide you with an individualized program based on your diagnosis and situation, designed to meet your specific needs.  With this comprehensive rehabilitation program, you will be expected to participate in at least 3 hours of rehabilitation therapies Monday-Friday, with modified therapy programming on the weekends.  Your rehabilitation program will include the following services:  Physical Therapy (PT), Occupational Therapy (OT), Speech Therapy (ST), 24 hour per day rehabilitation nursing, Neuropsychology, Case Management (Social Worker), Rehabilitation Medicine, Nutrition Services and Pharmacy Services  Weekly team conferences will be held on Wednesdays to discuss your progress.  Your Social Worker will talk with you frequently to get your input and to update you on team discussions.  Team conferences with you and your family in attendance may also be held.  Expected length of stay:  2 to 2 1/2 weeks  Overall anticipated outcome:  Independent with assistive device overall with supervision needed for ambulation in the community and contact guard for stairs  Depending on your progress and recovery, your program may change. Your Social Worker will coordinate services and will keep you informed of any changes. Your Social Worker's name and contact numbers are listed  below.  The following services may also be recommended but are not provided by the [facility] will be made to provide these services after discharge if needed.  Arrangements include referral to agencies that provide these services.  Your insurance has been verified to be:  [employer] Your primary doctor is:   Dr. Crist Infante  Pertinent information will be shared with your doctor and your insurance company.  Social Worker:  Alfonse Alpers, LCSW  [phone] or [phone]  Information discussed with and copy given to patient by: Trey Sailors, 03/30/2018, 10:43 AM

## 2018-03-30 NOTE — Progress Notes (Signed)
Speech Language Pathology Daily Session Note  Patient Details  Name: [name] MRN: [id number] Date of Birth: 1940/09/01  Today's Date: 03/30/2018 SLP Individual Time: 0730-0830 SLP Individual Time Calculation (min): 60 min  Short Term Goals: Week 1: SLP Short Term Goal 1 (Week 1): Pt will utilize speech intelligibility strategies with Mod I to produce > 95% intelligible speech at the complex conversation level.  SLP Short Term Goal 2 (Week 1): Pt will complete complex problem solving tasks with supervision cues.  SLP Short Term Goal 3 (Week 1): Pt will demonstrate selective attention in moderately distracting environment for ~ 45 minutes with supervision cues.  SLP Short Term Goal 4 (Week 1): Pt will demonstrate anticipatory awareness by listing safe vs. unsafe tasks with supervision cues.   Skilled Therapeutic Interventions: Skilled treatment session focused on cognition goals. SLP received pt in bed, alert and aided in transfer to wheelchair. SLP facilitated session by providing more than a reasonable time to complete semi-complex calendar task. Pt requires more than a reasonable amount of time because she wants to talk herself "through the task." She also deals with increased physicial s/s of anxiety throughout each task. Support given. With more complex deductive reasoning task, pt required Min A cues. Pt was returned to room, left upright in wheelchair, chair alarm on and all needs within reach. Education provided to son on objectives within therapy tasks. Continue per current plan of care.      Function:   Cognition Comprehension Comprehension assist level: Understands complex 90% of the time/cues 10% of the time;Follows basic conversation/direction with no assist  Expression   Expression assist level: Expresses complex 90% of the time/cues < 10% of the time  Social Interaction Social Interaction assist level: Interacts appropriately with others with medication or extra time  (anti-anxiety, antidepressant).  Problem Solving Problem solving assist level: Solves complex 90% of the time/cues < 10% of the time  Memory Memory assist level: More than reasonable amount of time    Pain    Therapy/Group: Individual Therapy  [name] 03/30/2018, 8:11 AM

## 2018-03-30 NOTE — Patient Care Conference (Signed)
Inpatient RehabilitationTeam Conference and Plan of Care Update Date: 03/29/2018   Time:  10:55 AM    Patient Name: [name]      Medical Record Number: [id number]  Date of Birth: 1941-04-26 Sex: Female         Room/Bed: 4W19C/4W19C-01 Payor Info: Payor: [employer] MEDICARE / Plan: [employer] MEDICARE / Product Type: *No Product type* /    Admitting Diagnosis: Rt Pontine infarct  Admit Date/Time:  03/28/2018  4:52 PM Admission Comments: No comment available   Primary Diagnosis:  <principal problem not specified> Principal Problem: <principal problem not specified>  Patient Active Problem List   Diagnosis Date Noted  . Small vessel disease ([place]) 03/28/2018  . Diabetes mellitus type 2 in nonobese (HCC)   . Anxiety state   . Stage 3 chronic kidney disease ([place])   . Hyperlipidemia   . History of breast cancer   . Acute ischemic stroke ([place])   . TIA (transient ischemic attack) 03/24/2018  . Dyspnea on exertion 05/30/2017  . Sinus tachycardia 05/30/2017  . Mixed hyperlipidemia 05/30/2017  . Essential hypertension 05/30/2017    Expected Discharge Date: Expected Discharge Date: (2 to 2 1/2 weeks - date to be set next week)  Team Members Present: Physician leading conference: Dr. Alysia Penna Social Worker Present: Alfonse Alpers, LCSW Nurse Present: Dorien Chihuahua, RN PT Present: Lavone Nian, PT OT Present: Simonne Come, OT SLP Present: Stormy Fabian, SLP PPS Coordinator present : Daiva Nakayama, RN, CRRN     Current Status/Progress Goal Weekly Team Focus  Medical   Patient with mild left hemiparesis and fine motor deficits.  Cognitively looks at baseline  Maintain medical stability, reduce fall risk  Initiate rehab program   Bowel/Bladder   Continent of Bladder and Bowel  Remains continent of bladder and bowel  toileting prn and as needed   Swallow/Nutrition/ Hydration             ADL's   min assist overall  Mod I  ADL retraining, LUE NMR, dynamic standing balance,  simple meal prep   Mobility   eval pending         Communication             Safety/Cognition/ Behavioral Observations  Supervision with complex  Mod I complex  evaluation and education completed   Pain   No complaints of pain  Pain < or = 2  Assess pain q shift and as needed and treat as needed and requested   Skin   Ecchymosis to mid lower belly  Patient remain free of infection or additional ecchymosis  Assess skin q shift  and assess needed    Rehab Goals Patient on target to meet rehab goals: Yes Rehab Goals Revised: none - first conference on day of evaluations *See Care Plan and progress notes for long and short-term goals.     Barriers to Discharge  Current Status/Progress Possible Resolutions Date Resolved   Physician    Medical stability     Initiating therapy program  See above      Nursing                  PT  Decreased caregiver support;Home environment access/layout;Lack of/limited family support                 OT Decreased caregiver support  family can only provide intermittent assist             SLP  SW                Discharge Planning/Teaching Needs:  Pt plans to return to her home with intermittent assistance, as needed.  To be offered closer to d/c, as needed.   Team Discussion:  Pt with on and off continence following a stroke.  She has some higher level cognitive deficits.  Pt ambulated with min guard A to the bathroom, but evals were still pending at time of conference.  Revisions to Treatment Plan:  none    Continued Need for Acute Rehabilitation Level of Care: The patient requires daily medical management by a physician with specialized training in physical medicine and rehabilitation for the following conditions: Daily direction of a multidisciplinary physical rehabilitation program to ensure safe treatment while eliciting the highest outcome that is of practical value to the patient.: Yes Daily medical management of patient  stability for increased activity during participation in an intensive rehabilitation regime.: Yes Daily analysis of laboratory values and/or radiology reports with any subsequent need for medication adjustment of medical intervention for : Neurological problems;Blood pressure problems   I attest that I was present, lead the team conference, and concur with the assessment and plan of the team.   Renell Coaxum, Silvestre Mesi 03/30/2018, 1:05 PM

## 2018-03-31 ENCOUNTER — Inpatient Hospital Stay (HOSPITAL_COMMUNITY): Payer: Medicare Other | Admitting: Occupational Therapy

## 2018-03-31 ENCOUNTER — Inpatient Hospital Stay (HOSPITAL_COMMUNITY): Payer: Medicare Other | Admitting: Physical Therapy

## 2018-03-31 ENCOUNTER — Inpatient Hospital Stay (HOSPITAL_COMMUNITY): Payer: Medicare Other | Admitting: Speech Pathology

## 2018-03-31 LAB — GLUCOSE, CAPILLARY
Glucose-Capillary: 182 mg/dL — ABNORMAL HIGH (ref 70–99)
Glucose-Capillary: 157 mg/dL — ABNORMAL HIGH (ref 70–99)
Glucose-Capillary: 163 mg/dL — ABNORMAL HIGH (ref 70–99)
Glucose-Capillary: 166 mg/dL — ABNORMAL HIGH (ref 70–99)

## 2018-03-31 NOTE — Progress Notes (Signed)
Speech Language Pathology Daily Session Note  Patient Details  Name: [name] MRN: [id number] Date of Birth: 11-10-1940  Today's Date: 03/31/2018 SLP Individual Time: 1300-1400 SLP Individual Time Calculation (min): 60 min  Short Term Goals: Week 1: SLP Short Term Goal 1 (Week 1): Pt will utilize speech intelligibility strategies with Mod I to produce > 95% intelligible speech at the complex conversation level.  SLP Short Term Goal 2 (Week 1): Pt will complete complex problem solving tasks with supervision cues.  SLP Short Term Goal 3 (Week 1): Pt will demonstrate selective attention in moderately distracting environment for ~ 45 minutes with supervision cues.  SLP Short Term Goal 4 (Week 1): Pt will demonstrate anticipatory awareness by listing safe vs. unsafe tasks with supervision cues.   Skilled Therapeutic Interventions: Skilled treatment session focused on cognition goals. SLP facilitated session by providing Max A cues for intellectual awareness. Pt with excessive tangential emotional responses to any cues or information provided to SLP. Despite significant attempts by SLP to redirect pt, she was not able to redirect from self-directed perseverative emotional responses. Emotional support given, permission given for me to contact son for more information on how to provide cues without increasing pt's anxiety and fear of failure. Pt was returned to room, left upright in wheelchair with all needs within reach.      Function:  Eating Eating   Modified Consistency Diet: No Eating Assist Level: Set up assist for   Eating Set Up Assist For: Opening containers       Cognition Comprehension Comprehension assist level: Follows basic conversation/direction with extra time/assistive device(pt interprets information with emotional filter)  Expression   Expression assist level: Expresses basic 90% of the time/requires cueing < 10% of the time.  Social Interaction Social Interaction  assist level: Interacts appropriately 75 - 89% of the time - Needs redirection for appropriate language or to initiate interaction.  Problem Solving    Memory Memory assist level: More than reasonable amount of time    Pain    Therapy/Group: Individual Therapy  Mabeline Varas 03/31/2018, 3:07 PM

## 2018-03-31 NOTE — IPOC Note (Signed)
Overall Plan of Care [facility]) Patient Details Name: Debarah Mccumbers MRN: [id number] DOB: March 18, 1941  Admitting Diagnosis: <principal problem not specified>  Hospital Problems: Active Problems:   Small vessel disease ([place])     Functional Problem List: Nursing    PT Balance, Endurance, Motor, Safety  OT Balance, Cognition, Endurance, Motor, Perception, Safety, Vision  SLP Cognition, Motor  TR         Basic ADL's: OT Grooming, Bathing, Dressing, Toileting     Advanced  ADL's: OT Simple Meal Preparation     Transfers: PT Bed Mobility, Bed to Chair, Car, Manufacturing systems engineer, Metallurgist: PT Ambulation, Emergency planning/management officer, Stairs     Additional Impairments: OT Fuctional Use of Upper Extremity  SLP Communication, Social Cognition expression Problem Solving, Attention, Awareness  TR      Anticipated Outcomes Item Anticipated Outcome  Self Feeding    Swallowing      Basic self-care  Mod I  Toileting  Mod I   Bathroom Transfers Mod I  Bowel/Bladder     Transfers  modI  Locomotion  modI with LRAD, S stairs for home entry  Communication  Mod I  Cognition  Mod I  Pain     Safety/Judgment      Therapy Plan: PT Intensity: Minimum of 1-2 x/day ,45 to 90 minutes PT Frequency: 5 out of 7 days PT Duration Estimated Length of Stay: 2-2.5 weeks OT Intensity: Minimum of 1-2 x/day, 45 to 90 minutes OT Frequency: 5 out of 7 days OT Duration/Estimated Length of Stay: 14-16 days SLP Intensity: Minumum of 1-2 x/day, 30 to 90 minutes SLP Frequency: 3 to 5 out of 7 days SLP Duration/Estimated Length of Stay: 2 weeks    Team Interventions: Nursing Interventions    PT interventions Ambulation/gait training, Discharge planning, Functional mobility training, Psychosocial support, Therapeutic Activities, Visual/perceptual remediation/compensation, Wheelchair propulsion/positioning, Therapeutic Exercise, Skin care/wound management, Neuromuscular re-education,  Balance/vestibular training, Disease management/prevention, Cognitive remediation/compensation, DME/adaptive equipment instruction, Pain management, Splinting/orthotics, UE/LE Coordination activities, UE/LE Strength taining/ROM, Stair training, Patient/family education, Community reintegration  OT Interventions Training and development officer, Cognitive remediation/compensation, Academic librarian, Discharge planning, Disease mangement/prevention, Engineer, drilling, Functional mobility training, Neuromuscular re-education, Pain management, Patient/family education, Psychosocial support, Self Care/advanced ADL retraining, Therapeutic Activities, Therapeutic Exercise, UE/LE Strength taining/ROM, UE/LE Coordination activities, Visual/perceptual remediation/compensation  SLP Interventions Cognitive remediation/compensation, Cueing hierarchy, Medication managment, Patient/family education  TR Interventions    SW/CM Interventions Discharge Planning, Psychosocial Support, Patient/Family Education   Barriers to Discharge MD  Medical stability  Nursing      PT Decreased caregiver support, Home environment access/layout, Lack of/limited family support    OT Decreased caregiver support family can only provide intermittent assist  SLP      SW       Team Discharge Planning: Destination: PT-Home ,OT- Home , SLP-Home Projected Follow-up: PT-Home health PT, OT-  Home health OT, SLP-None Projected Equipment Needs: PT-To be determined, OT- Tub/shower bench, SLP-None recommended by SLP Equipment Details: PT-no equipment, OT-  Patient/family involved in discharge planning: PT- Patient,  OT-Patient, SLP-Patient, Family member/caregiver  MD ELOS: 7d Medical Rehab Prognosis:  Good Assessment:  77 year old right-handed female with history of tachycardia status post ablation 2007, diabetes mellitus, right breast cancer status post lumpectomy as well as colon cancer, CKD stage III.  Per chart  review patient lives alone was independent prior to admission and driving.  Family does provide Doctor, hospital.  One level town home.  Son is a Field seismologist.  Presented 03/24/2018 with left-sided weakness and facial droop as well as slurred speech.  Cranial CT scan negative for acute changes.  Patient did receive TPA.  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  No significant proximal stenosis aneurysm or branch vessel occlusion.  Echocardiogram with ejection fraction of 08% grade 1 diastolic dysfunction.  Carotid Dopplers with no ICA stenosis.  Neurology consulted presently on aspirin and Plavix for CVA prophylaxis.  Subcutaneous heparin for DVT prophylaxis.  Patient is tolerating a regular diet.  Physical and occupational therapy evaluations completed with recommendations of physical medicine rehab consult.  Patient was admitted for a comprehensive rehab program    Now requiring 24/7 Rehab RN,MD, as well as CIR level PT, OT and SLP.  Treatment team will focus on ADLs and mobility with goals set at [place] See Team Conference Notes for weekly updates to the plan of care

## 2018-03-31 NOTE — Progress Notes (Signed)
Physical Therapy Session Note  Patient Details  Name: [name] MRN: [id number] Date of Birth: [birth year]  Today's Date: 03/31/2018 PT Individual Time: 0901-0928 PT Individual Time Calculation (min): 27 min   Short Term Goals: Week 1:  PT Short Term Goal 1 (Week 1): Pt will perform bed mobility with S from flat bed PT Short Term Goal 2 (Week 1): Pt will perform transfers with consistent S PT Short Term Goal 3 (Week 1): Pt will ambulate x150' with LRAD and S PT Short Term Goal 4 (Week 1): Pt will ascend/descend 3 steps no rails with minA  Skilled Therapeutic Interventions/Progress Updates: Pt presented in w/c agreeable to therapy. Pt transported to rehab gym for time management. Pt participated in static and dynamic balance activities including static standing on A[employer], feet together/feet apart and head turns. Pt demonstrated decreased ankle strategies for recovery moving feet for reorientation in >75% of trials. Pt also performed toe taps and alternating toe taps on level tile without AD. Pt initially requiring heavy reliance via HHA from PTA on R side however decreased with verbal cues and increased attempts. Pt ambulated approx 118f towards room requiring min cues for increasing BOS and L step length. PTA stopped pt due to pant length and pt noted stepping on pant legs. Pt returned to w/c and transported remaining distance back to room. Pt remained in w/c at end of session with call bell within reach, chair alarm on, and current needs met.      Therapy Documentation Precautions:  Precautions Precautions: Fall Precaution Comments: mild L inattention Restrictions Weight Bearing Restrictions: No   See Function Navigator for Current Functional Status.   Therapy/Group: Individual Therapy  Tagen Brethauer  Journiee Feldkamp, PTA  03/31/2018, 12:31 PM

## 2018-03-31 NOTE — Progress Notes (Signed)
Subjective/Complaints:  No issues overnite, discussed therapy progress Son at bedside ROS  Denies CP, SOB, N/V/D  Objective: Vital Signs: Blood pressure 140/82, pulse 93, temperature 98 F (36.7 C), temperature source Oral, resp. rate 18, height '5\' 7"'$  (1.702 m), weight 85 kg, SpO2 97 %. No results found. Results for orders placed or performed during the hospital encounter of 03/28/18 (from the past 72 hour(s))  Glucose, capillary     Status: Abnormal   Collection Time: 03/28/18  5:51 PM  Result Value Ref Range   Glucose-Capillary 188 (H) 70 - 99 mg/dL   Comment 1 Document in Chart   Glucose, capillary     Status: Abnormal   Collection Time: 03/28/18  9:07 PM  Result Value Ref Range   Glucose-Capillary 223 (H) 70 - 99 mg/dL  CBC WITH DIFFERENTIAL     Status: Abnormal   Collection Time: 03/29/18  5:29 AM  Result Value Ref Range   WBC 6.8 4.0 - 10.5 K/uL   RBC 3.77 (L) 3.87 - 5.11 MIL/uL   Hemoglobin 11.9 (L) 12.0 - 15.0 g/dL   HCT 36.2 36.0 - 46.0 %   MCV 96.0 78.0 - 100.0 fL   MCH 31.6 26.0 - 34.0 pg   MCHC 32.9 30.0 - 36.0 g/dL   RDW 13.2 11.5 - 15.5 %   Platelets 207 150 - 400 K/uL   Neutrophils Relative % 67 %   Neutro Abs 4.5 1.7 - 7.7 K/uL   Lymphocytes Relative 22 %   Lymphs Abs 1.5 0.7 - 4.0 K/uL   Monocytes Relative 8 %   Monocytes Absolute 0.6 0.1 - 1.0 K/uL   Eosinophils Relative 3 %   Eosinophils Absolute 0.2 0.0 - 0.7 K/uL   Basophils Relative 0 %   Basophils Absolute 0.0 0.0 - 0.1 K/uL   Immature Granulocytes 0 %   Abs Immature Granulocytes 0.0 0.0 - 0.1 K/uL    Comment: Performed at [facility], [address], [place], [place] [address]  Comprehensive metabolic panel     Status: Abnormal   Collection Time: 03/29/18  5:29 AM  Result Value Ref Range   Sodium 137 135 - 145 mmol/L   Potassium 4.4 3.5 - 5.1 mmol/L   Chloride 105 98 - 111 mmol/L   CO2 23 22 - 32 mmol/L   Glucose, Bld 208 (H) 70 - 99 mg/dL   BUN 35 (H) 8 - 23 mg/dL   Creatinine,  Ser 1.92 (H) 0.44 - 1.00 mg/dL   Calcium 10.2 8.9 - 10.3 mg/dL   Total Protein 6.3 (L) 6.5 - 8.1 g/dL   Albumin 3.3 (L) 3.5 - 5.0 g/dL   AST 38 15 - 41 U/L   ALT 46 (H) 0 - 44 U/L   Alkaline Phosphatase 82 38 - 126 U/L   Total Bilirubin 0.4 0.3 - 1.2 mg/dL   GFR calc non Af Amer 24 (L) >60 mL/min   GFR calc Af Amer 28 (L) >60 mL/min    Comment: (NOTE) The eGFR has been calculated using the CKD EPI equation. This calculation has not been validated in all clinical situations. eGFR's persistently <60 mL/min signify possible Chronic Kidney Disease.    Anion gap 9 5 - 15    Comment: Performed at [facility] [address] [place], [place] [address]  Glucose, capillary     Status: Abnormal   Collection Time: 03/29/18  7:03 AM  Result Value Ref Range   Glucose-Capillary 195 (H) 70 -  99 mg/dL   Comment 1 Notify RN   Glucose, capillary     Status: Abnormal   Collection Time: 03/29/18 11:43 AM  Result Value Ref Range   Glucose-Capillary 210 (H) 70 - 99 mg/dL  Glucose, capillary     Status: Abnormal   Collection Time: 03/29/18  5:15 PM  Result Value Ref Range   Glucose-Capillary 184 (H) 70 - 99 mg/dL  Glucose, capillary     Status: Abnormal   Collection Time: 03/29/18  9:01 PM  Result Value Ref Range   Glucose-Capillary 162 (H) 70 - 99 mg/dL  Glucose, capillary     Status: Abnormal   Collection Time: 03/30/18  6:41 AM  Result Value Ref Range   Glucose-Capillary 174 (H) 70 - 99 mg/dL  Glucose, capillary     Status: Abnormal   Collection Time: 03/30/18 11:53 AM  Result Value Ref Range   Glucose-Capillary 198 (H) 70 - 99 mg/dL   Comment 1 Notify RN   Glucose, capillary     Status: Abnormal   Collection Time: 03/30/18  4:59 PM  Result Value Ref Range   Glucose-Capillary 171 (H) 70 - 99 mg/dL   Comment 1 Notify RN   Glucose, capillary     Status: Abnormal   Collection Time: 03/31/18  6:45 AM  Result Value Ref Range   Glucose-Capillary 182 (H) 70 - 99 mg/dL     HEENT:  normal Cardio: RRR and no murmur Resp: CTA B/L and unlabored GI: BS positive and NT, ND Extremity:  No Edema Skin:   Intact and Other IV site CDI Neuro: Alert/Oriented, Normal Sensory, Abnormal Motor 4= Left delt bi tri grip HF, KE 3- L ankle DF, Abnormal FMC Ataxic/ dec FMC and Dysarthric Musc/Skel:  Other RIght sided neck pain with ROM Gen NAD   Assessment/Plan: 1. 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 Physiatrist is providing close team supervision and 24 hour management of active medical problems listed below. Physiatrist and rehab team continue to assess barriers to discharge/monitor patient progress toward functional and medical goals. FIM: Function - Bathing Position: Shower Body parts bathed by patient: Right arm, Left arm, Chest, Abdomen, Front perineal area, Right upper leg, Left upper leg Body parts bathed by helper: Buttocks, Right lower leg, Left lower leg, Back Assist Level: Touching or steadying assistance(Pt > 75%)(Mod assist)  Function- Upper Body Dressing/Undressing What is the patient wearing?: Pull over shirt/dress Pull over shirt/dress - Perfomed by patient: Thread/unthread right sleeve, Thread/unthread left sleeve, Put head through opening Pull over shirt/dress - Perfomed by helper: Pull shirt over trunk Assist Level: Touching or steadying assistance(Pt > 75%) Function - Lower Body Dressing/Undressing What is the patient wearing?: Pants, Non-skid slipper socks Position: Wheelchair/chair at sink Pants- Performed by helper: Thread/unthread right pants leg, Thread/unthread left pants leg, Pull pants up/down Non-skid slipper socks- Performed by helper: Don/doff right sock, Don/doff left sock Assist for footwear: Maximal assist Assist for lower body dressing: Touching or steadying assistance (Pt > 75%)(Total  asisst)  Function - Toileting Toileting steps completed by patient: (P) Adjust clothing prior to toileting, Performs perineal hygiene, Adjust clothing after toileting Toileting steps completed by helper: Performs perineal hygiene Toileting Assistive Devices: (P) Grab bar or rail Assist level: Supervision or verbal cues  Function - Air cabin crew transfer assistive device: Grab bar, Walker Assist level to toilet: Touching or steadying assistance (Pt > 75%)  Assist level from toilet: Touching or steadying assistance (Pt > 75%)  Function - Chair/bed transfer Chair/bed transfer method: Ambulatory Chair/bed transfer assist level: Touching or steadying assistance (Pt > 75%) Chair/bed transfer assistive device: Armrests, Walker Chair/bed transfer details: Verbal cues for sequencing, Tactile cues for weight shifting, Tactile cues for placement, Tactile cues for posture, Verbal cues for safe use of DME/AE, Verbal cues for precautions/safety  Function - Locomotion: Wheelchair Will patient use wheelchair at discharge?: No Type: Manual Max wheelchair distance: 75 Assist Level: Supervision or verbal cues Assist Level: Supervision or verbal cues Wheel 150 feet activity did not occur: Safety/medical concerns(fatigue) Turns around,maneuvers to table,bed, and toilet,negotiates 3% grade,maneuvers on rugs and over doorsills: No Function - Locomotion: Ambulation Assistive device: Walker-rolling Max distance: 11' Assist level: Touching or steadying assistance (Pt > 75%) Assist level: Touching or steadying assistance (Pt > 75%) Assist level: Touching or steadying assistance (Pt > 75%) Walk 150 feet activity did not occur: Safety/medical concerns(fatigue) Walk 10 feet on uneven surfaces activity did not occur: Safety/medical concerns Assist level: Touching or steadying assistance (Pt > 75%)  Function - Comprehension Comprehension: Auditory Comprehension assist level: Follows basic  conversation/direction with extra time/assistive device  Function - Expression Expression: Verbal Expression assist level: Expresses complex 90% of the time/cues < 10% of the time  Function - Social Interaction Social Interaction assist level: Interacts appropriately with others with medication or extra time (anti-anxiety, antidepressant).  Function - Problem Solving Problem solving assist level: Solves complex 90% of the time/cues < 10% of the time  Function - Memory Memory assist level: More than reasonable amount of time Patient normally able to recall (first 3 days only): Current season, Location of own room, Staff names and faces, That he or she is in a hospital  Medical Problem List and Plan: 1.  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  DVT Prophylaxis/Anticoagulation: Subcutaneous heparin, no Lovenox due to Cr Cl 3. Pain Management: Tylenol as needed  4. Mood: Remeron 15 mg nightly, Effexor or 75 mg every other day, Ativan 0.4 mg 4 times daily, Abilify 5 mg daily Dr Sima Matas to follow 5. Neuropsych: This patient is capable of making decisions on her own behalf. 6. Skin/Wound Care: Routine skin checks 7. Fluids/Electrolytes/Nutrition: Routine in and outs with follow-up chemistries 8.  Diabetes mellitus.  Hemoglobin A1c 8.6.  NovoLog 70/30 10 units twice daily.  Check blood sugars before meals and at bedtime CBG (last 3)  Recent Labs    03/30/18 1153 03/30/18 1659 03/31/18 0645  GLUCAP 198* 171* 182*  CBG controlled 9/6- in hospital range of <180 9.  History of tachycardia with ablation 2007.  No chest pain or shortness of breath. 10.  CKD stage III.  Baseline creatinine 1.95.  Follow-up chemistries Creat is baseline BUN mildly elevated, Cr Cl <30 11.  History of breast cancer with lumpectomy.   Follow-up outpatient 12.  Hyperlipidemia.  Lipitor  LOS (Days) 3 A FACE TO FACE EVALUATION WAS PERFORMED  Charlett Blake 03/31/2018, 7:54 AM

## 2018-03-31 NOTE — Progress Notes (Signed)
Physical Therapy Session Note  Patient Details  Name: [name] MRN: [id number] Date of Birth: [birth year]  Today's Date: 03/31/2018 PT Individual Time: 1107-1202 PT Individual Time Calculation (min): 55 min   Short Term Goals: Week 1:  PT Short Term Goal 1 (Week 1): Pt will perform bed mobility with S from flat bed PT Short Term Goal 2 (Week 1): Pt will perform transfers with consistent S PT Short Term Goal 3 (Week 1): Pt will ambulate x150' with LRAD and S PT Short Term Goal 4 (Week 1): Pt will ascend/descend 3 steps no rails with minA  Skilled Therapeutic Interventions/Progress Updates:  Pt received asleep in bed but easily awakened & agreeable to tx. No c/o pain reported, pt only notes fatigue & BLE weakness. Pt transferred supine>sitting EOB with min assist and use of hospital bed features. Pt completes bed>w/c via stand pivot with min assist with cuing for hand placement & sequencing. Therapist donned shoes & transported pt to gym total assist for time management. Gait training x 100 ft + 100 ft with RW & min assist with cuing to decrease talking & attend to task, as well as cuing for increased heel strike LLE with poor return demo. Pt demonstrates decreased step length BLE, decreased stride length, absent heel strike LLE. Pt utilized cybex kinetron in sitting progressing to standing with BUE support and min assist with multimodal cuing for upright posture and to decrease reliance on BUE; task focused on weight shifting, dynamic balance, L NMR & BLE strengthening. Pt performed L lateral step ups on 3" step with BUE support and min assist with cuing for posture & BLE placement to allow room for both feet with task focusing on LLE strengthening & NMR. At end of session pt left sitting in room with chair alarm donned & all needs in reach. Pt demonstrates confusion, internal distraction, and poor anticipatory awareness throughout session.   Therapy Documentation Precautions:   Precautions Precautions: Fall Precaution Comments: mild L inattention Restrictions Weight Bearing Restrictions: No   See Function Navigator for Current Functional Status.   Therapy/Group: Individual Therapy  Waunita Schooner 03/31/2018, 12:16 PM

## 2018-03-31 NOTE — Progress Notes (Signed)
Occupational Therapy Session Note  Patient Details  Name: [name] MRN: [id number] Date of Birth: [birth year]  Today's Date: 03/31/2018 OT Individual Time: 0802-0900 OT Individual Time Calculation (min): 58 min    Short Term Goals: Week 1:  OT Short Term Goal 1 (Week 1): Pt will complete bathing with min assist at sit > stand level OT Short Term Goal 2 (Week 1): Pt will complete LB dressing with min assist OT Short Term Goal 3 (Week 1): Pt will complete 2 grooming tasks in standing with contact guard for increased activity tolerance  Skilled Therapeutic Interventions/Progress Updates:    Treatment session with focus on ADL retraining and functional use of LUE.  Pt received seated EOB with son present and leaving to go to work.  Engaged in dressing at sit > stand level from EOB with focus on hemi-dressing technique and attempting tasks prior to asking for assistance.  Pt completed LB dressing with min assist to pull pants over hips on Lt side due to LUE weakness.  Pt able to don socks with increased time but able to persist.  Pt ambulated to toilet with RW with min assist to min guard and completed transfer and toileting with min guard.  Pt able to pull underwear and pants over hips without assist post toileting.  Engaged in grooming tasks in standing at sink with close supervision.  Engaged in [place] in sitting progressing to standing with focus on picking up medium sized pegs in Lt hand placing in board to replicate pattern.  In standing engaged in reaching across midline to remove pegs and place in container.  Pt demonstrating improved functional use of LUE throughout self-care tasks and therapeutic activity.  Therapy Documentation Precautions:  Precautions Precautions: Fall Precaution Comments: mild L inattention Restrictions Weight Bearing Restrictions: No Pain:   Pt with no c/o pain.  See Function Navigator for Current Functional Status.   Therapy/Group: Individual  Therapy  Simonne Come 03/31/2018, 12:05 PM

## 2018-03-31 NOTE — Plan of Care (Signed)
  Problem: Consults Goal: RH STROKE PATIENT EDUCATION Description See Patient Education module for education specifics  Outcome: Progressing Goal: Diabetes Guidelines if Diabetic/Glucose > 140 Description If diabetic or lab glucose is > 140 mg/dl - Initiate Diabetes/Hyperglycemia Guidelines & Document Interventions  Outcome: Progressing   Problem: RH BOWEL ELIMINATION Goal: RH STG MANAGE BOWEL WITH ASSISTANCE Description STG Manage Bowel with mod I Assistance.  Outcome: Progressing   Problem: RH BLADDER ELIMINATION Goal: RH STG MANAGE BLADDER WITH ASSISTANCE Description STG Manage Bladder With mod I Assistance  Outcome: Progressing   Problem: RH SKIN INTEGRITY Goal: RH STG SKIN FREE OF INFECTION/BREAKDOWN Description Maintain skin integrity with mod I assist   Outcome: Progressing   Problem: RH KNOWLEDGE DEFICIT Goal: RH STG INCREASE KNOWLEDGE OF DIABETES Description Patient able to state 2 S/S of hypoglycemia/hyperglycemia with cues and treatment  Outcome: Progressing Goal: RH STG INCREASE KNOWLEGDE OF HYPERLIPIDEMIA Description Patient able to state 2 direct ways to manage high cholesterol with cues.    Outcome: Progressing Goal: RH STG INCREASE KNOWLEDGE OF STROKE PROPHYLAXIS Description Patient will be able to describe medication and lifestyle factors that help prevent stroke with cues  Outcome: Progressing

## 2018-04-01 ENCOUNTER — Inpatient Hospital Stay (HOSPITAL_COMMUNITY): Payer: Medicare Other | Admitting: Physical Therapy

## 2018-04-01 ENCOUNTER — Inpatient Hospital Stay (HOSPITAL_COMMUNITY): Payer: Medicare Other | Admitting: Occupational Therapy

## 2018-04-01 ENCOUNTER — Inpatient Hospital Stay (HOSPITAL_COMMUNITY): Payer: Medicare Other

## 2018-04-01 LAB — GLUCOSE, CAPILLARY
Glucose-Capillary: 178 mg/dL — ABNORMAL HIGH (ref 70–99)
Glucose-Capillary: 206 mg/dL — ABNORMAL HIGH (ref 70–99)
Glucose-Capillary: 234 mg/dL — ABNORMAL HIGH (ref 70–99)
Glucose-Capillary: 177 mg/dL — ABNORMAL HIGH (ref 70–99)

## 2018-04-01 NOTE — Progress Notes (Signed)
Occupational Therapy Session Note  Patient Details  Name: [name] MRN: [id number] Date of Birth: 09/19/1940  Today's Date: 04/01/2018 OT Individual Time: 1030-1100 OT Individual Time Calculation (min): 30 min    Short Term Goals: Week 1:  OT Short Term Goal 1 (Week 1): Pt will complete bathing with min assist at sit > stand level OT Short Term Goal 2 (Week 1): Pt will complete LB dressing with min assist OT Short Term Goal 3 (Week 1): Pt will complete 2 grooming tasks in standing with contact guard for increased activity tolerance  Skilled Therapeutic Interventions/Progress Updates:    Pt received in w/c and agreeable to getting dressed.  Pt transferred from w/c to arm chair stand pivot with S. She donned all of her clothing (except for TEDs) with S. She was able to actively use her LUE (she now raises it to 80 degrees).  She donned shoes and crossed legs to tie shoes using L hand with sufficient [place].  Pt stood with S as she pulled up her pants and was able to transfer back to her w/c.    Pt educated on 3 LUE exercises for increased shoulder AROM with demonstration and return demonstration. 1) backward shoulder rolls 2) arm positioned at 45 degrees flexion with in and out arm circles 3) tricep extensions to full range.   Pt encouraged to do arm exercises this afternoon and during the day on Sunday. Pt's PT arrived for her next session  Therapy Documentation Precautions:  Precautions Precautions: Fall Precaution Comments: mild L inattention Restrictions Weight Bearing Restrictions: Yes(Restricted RUE) Pain: Pain Assessment Pain Score: 0-No pain ADL:  See Function Navigator for Current Functional Status.   Therapy/Group: Individual Therapy  [employer] 04/01/2018, 12:38 PM

## 2018-04-01 NOTE — Plan of Care (Signed)
  Problem: Consults Goal: RH STROKE PATIENT EDUCATION Description See Patient Education module for education specifics  Outcome: Progressing Goal: Diabetes Guidelines if Diabetic/Glucose > 140 Description If diabetic or lab glucose is > 140 mg/dl - Initiate Diabetes/Hyperglycemia Guidelines & Document Interventions  Outcome: Progressing   Problem: RH BOWEL ELIMINATION Goal: RH STG MANAGE BOWEL WITH ASSISTANCE Description STG Manage Bowel with mod I Assistance.  Outcome: Progressing   Problem: RH BLADDER ELIMINATION Goal: RH STG MANAGE BLADDER WITH ASSISTANCE Description STG Manage Bladder With mod I Assistance  Outcome: Progressing   Problem: RH SKIN INTEGRITY Goal: RH STG SKIN FREE OF INFECTION/BREAKDOWN Description Maintain skin integrity with mod I assist   Outcome: Progressing   Problem: RH KNOWLEDGE DEFICIT Goal: RH STG INCREASE KNOWLEDGE OF DIABETES Description Patient able to state 2 S/S of hypoglycemia/hyperglycemia with cues and treatment  Outcome: Progressing Goal: RH STG INCREASE KNOWLEGDE OF HYPERLIPIDEMIA Description Patient able to state 2 direct ways to manage high cholesterol with cues.    Outcome: Progressing Goal: RH STG INCREASE KNOWLEDGE OF STROKE PROPHYLAXIS Description Patient will be able to describe medication and lifestyle factors that help prevent stroke with cues  Outcome: Progressing

## 2018-04-01 NOTE — Progress Notes (Signed)
Occupational Therapy Session Note  Patient Details  Name: [name] MRN: [id number] Date of Birth: July 07, 1941  Today's Date: 04/01/2018 OT Individual Time: 0277-4128 OT Individual Time Calculation (min): 71 min   Short Term Goals: Week 1:  OT Short Term Goal 1 (Week 1): Pt will complete bathing with min assist at sit > stand level OT Short Term Goal 2 (Week 1): Pt will complete LB dressing with min assist OT Short Term Goal 3 (Week 1): Pt will complete 2 grooming tasks in standing with contact guard for increased activity tolerance  Skilled Therapeutic Interventions/Progress Updates:    Pt greeted in w/c with NT present. Crying, and reported she couldn't stop crying. She expressed feeling overwhelmed by her current medical situation and also that she was perseverating on negative thoughts. Too upset to engage in self care tasks. Started session with pt education regarding coping strategy development. Emphasized importance of transferring emotional energy into meaningful occupation for increasing psychosocial health and emotional wellbeing. We discussed her past hobbies, with pt reporting she used to enjoy dancing and singing. OT played 87s music for her in room and guided her through gentle UE/LE exercises when seated in w/c. Instructed her on active assist technique when L UE was fatigued. This visibly improved affect. Pt then requested to complete plant care. Pt used L UE to hold bouquet of flowers/vase while OT assisted with trimming stems and refilling vase with water. For remainder of session, we worked on transforming negative thoughts into positive statements using modified CBT techniques. These positive statements were written on her wall, along with inner strengths that pt reported feeling proud of. Discussed with pt importance of internalizing positive emotions for increasing self efficacy and participation in CIR therapies. Pt verbalized understanding. RN and NT made aware to keep music  playing in room for addressing psychosocial health. Pt left with all needs within reach.   Therapy Documentation Precautions:  Precautions Precautions: Fall Precaution Comments: mild L inattention Restrictions Weight Bearing Restrictions: Yes(Restricted RUE) Pain: No c/o pain during tx    ADL:      See Function Navigator for Current Functional Status.   Therapy/Group: Individual Therapy  Trevis Eden A Kayan Blissett 04/01/2018, 12:20 PM

## 2018-04-01 NOTE — Progress Notes (Signed)
Speech Language Pathology Daily Session Note  Patient Details  Name: [name] MRN: [id number] Date of Birth: July 05, 1941  Today's Date: 04/01/2018 SLP Individual Time: 3244-0102 SLP Individual Time Calculation (min): 44 min  Short Term Goals: Week 1: SLP Short Term Goal 1 (Week 1): Pt will utilize speech intelligibility strategies with Mod I to produce > 95% intelligible speech at the complex conversation level.  SLP Short Term Goal 2 (Week 1): Pt will complete complex problem solving tasks with supervision cues.  SLP Short Term Goal 3 (Week 1): Pt will demonstrate selective attention in moderately distracting environment for ~ 45 minutes with supervision cues.  SLP Short Term Goal 4 (Week 1): Pt will demonstrate anticipatory awareness by listing safe vs. unsafe tasks with supervision cues.   Skilled Therapeutic Interventions:Skilled ST services focused on cognitive skills. Pt was coughing upon entering room, due to thin liquid via straw, however SLP provided room temperature water compared to ice water and throughout session no noted s/s aspiration with temperature change. SLP facilitated complex problem solving skills utilizing scheduling task, pt required max A verbal cues fade to mod A verbal cues, likely due to deficits in working memory, organization and selective attention. Pt required mod A verbal cues in 10 minute intervals due to internals distractions exacerbated by anxeity. SLP communicated with pt's son, suggesting baseline cognitive ability supervision at best and only responsible for medication management not money management. SLP educated pt and son's that goals will be adjusted in future sessions to better reflect baseline ability. Pt was left in room with call bell within reach and bed alarm set. Recommend to continue skilled ST services.      Function:  Eating Eating                 Cognition Comprehension Comprehension assist level: Follows basic  conversation/direction with extra time/assistive device  Expression   Expression assist level: Expresses complex 90% of the time/cues < 10% of the time  Social Interaction Social Interaction assist level: Interacts appropriately 50 - 74% of the time - May be physically or verbally inappropriate.  Problem Solving Problem solving assist level: Solves basic problems with no assist  Memory      Pain Pain Assessment Pain Score: 0-No pain  Therapy/Group: Individual Therapy  Kristopher Attwood  [facility] 04/01/2018, 3:53 PM

## 2018-04-01 NOTE — Progress Notes (Signed)
Physical Therapy Session Note  Patient Details  Name: [name] MRN: [id number] Date of Birth: 08/18/1940  Today's Date: 04/01/2018 PT Individual Time: 1115-1204 and 1404-1430 PT Individual Time Calculation (min): 49 min and 26 min   Short Term Goals: Week 1:  PT Short Term Goal 1 (Week 1): Pt will perform bed mobility with S from flat bed PT Short Term Goal 2 (Week 1): Pt will perform transfers with consistent S PT Short Term Goal 3 (Week 1): Pt will ambulate x150' with LRAD and S PT Short Term Goal 4 (Week 1): Pt will ascend/descend 3 steps no rails with minA  Skilled Therapeutic Interventions/Progress Updates:  Treatment 1: Pt received in w/c requesting a few minutes to speak with son alone. Therapist returned shortly & pt agreeable to tx. Pt reports LUE soreness/fatigue at end of session - therapist provided LUE support with table top during activity. Transported pt to gym via w/c total assist for time management. Gait x 50 ft without AD and min assist to focus on dynamic balance, weight shifting, and gait pattern with pt requiring cuing for increased step length BLE, and increased heel strike LLE. Transitioned to gait with RW x 100 ft with ongoing cuing with pt able to demonstrate improved heel strike 50% of the time. Pt performed sit>stand transfers from EOM with RLE elevated on 3" step with max cuing for anterior/L weight shifting to focus on LLE strengthening & NMR through increased forced use; pt required mod/max assist for sit>stand transfer during activity. Pt performed L lateral step ups on 3" step with BUE support for balance and min assist with slightly improved LLE foot clearance on this date & task focusing on LLE strengthening & NMR. Pt demonstrates active L dorsiflexion but may be too internally distracted to focus on this during functional tasks. Pt reports significant BLE fatigue therefore transitioned to LUE task of retreiving beads from yellow therapy putty for LUE  strengthening & FMC. Pt requires cuing to attend to task at hand as pt is very verbose, internally distracted, and tangential in conversation. Pt's son [name]) present for entire session & reports pt is fairly close to cognitive baseline in regards to verbose conversation & decreased memory; he reports he assisted her with complex tasks at baseline. Educated [name] that pt's goals have been downgraded to supervision at this time 2/2 impaired cognition and decreased functional mobility. Also discussed potential DME needs at d/c as well as HHPT recommendation. At end of session pt left in w/c with chair alarm donned, set up with lunch tray, call bell in reach & [name] present to supervise.   Treatment 2: Pt received in w/c & agreeable to tx. Pt denied c/o pain. Transported pt to/from gym via w/c total assist for time management. Pt completed Berg Balance Test & scored [phone]; educated pt on interpretation of score & current fall risk. Patient demonstrates increased fall risk as noted by score of 36/56 on Berg Balance Scale.  (<36= high risk for falls, close to 100%; 37-45 significant >80%; 46-51 moderate >50%; 52-55 lower >25%). At end of session pt transferred w/c>bed via stand pivot with supervision and sit>supine with supervision & bed rails. Pt left in bed with alarm set & all needs in reach.   Pt reports she was having difficulty transferring sit>stand from furniture for ~3 months prior to admission.   Therapy Documentation Precautions:  Precautions Precautions: Fall Precaution Comments: mild L inattention Restrictions Weight Bearing Restrictions: Yes(Restricted RUE)   General: PT Amount of  Missed Time (min): 11 Minutes PT Missed Treatment Reason: (pt requested a few minutes to speak with son)   Balance: Balance Balance Assessed: Yes Standardized Balance Assessment Standardized Balance Assessment: Berg Balance Test Berg Balance Test Sit to Stand: Able to stand without using hands and stabilize  independently Standing Unsupported: Able to stand 2 minutes with supervision Sitting with Back Unsupported but Feet Supported on Floor or Stool: Able to sit safely and securely 2 minutes Stand to Sit: Sits safely with minimal use of hands Transfers: Able to transfer with verbal cueing and /or supervision Standing Unsupported with Eyes Closed: Able to stand 10 seconds with supervision Standing Ubsupported with Feet Together: Able to place feet together independently and stand for 1 minute with supervision From Standing, Reach Forward with Outstretched Arm: Can reach confidently >25 cm (10") From Standing Position, Pick up Object from Floor: Able to pick up shoe, needs supervision From Standing Position, Turn to Look Behind Over each Shoulder: Needs supervision when turning Turn 360 Degrees: Needs close supervision or verbal cueing(completes turns to both directions slowly but with close supervision) Standing Unsupported, Alternately Place Feet on Step/Stool: Able to complete >2 steps/needs minimal assist Standing Unsupported, One Foot in Front: Able to take small step independently and hold 30 seconds Standing on One Leg: Tries to lift leg/unable to hold 3 seconds but remains standing independently Total Score: 36   See Function Navigator for Current Functional Status.   Therapy/Group: Individual Therapy  Waunita Schooner 04/01/2018, 2:36 PM

## 2018-04-01 NOTE — Progress Notes (Signed)
Patient ID: [name], female   DOB: [birth year], 77 y.o.   MRN: [id number]   [name] is a 77 y.o. 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 Anxiety   . Back ache   . Breast cancer ([place])    right  . Bronchitis   . Cancer of colon ([place])   . Depression   . DM (diabetes mellitus) ([place])   . Dyspnea on exertion   . Fall on steps    age 46 feel down iron stairs couldnot move leg for 6 mths  . Family history of primary TB   . Fracture of ankle, closed 2014   left  . History of chicken pox   . History of hysterectomy 04/1980  . History of primary TB   . Hyperlipidemia   . Hypertension   . Kidney disease, chronic, stage III (moderate, EGFR 30-59 ml/min) ([place]) 2017  . Kidney disorder    reduction function  . Kidney stones   . Knee pain, right   . Lung abnormality    age 77 patient had pna spot on lungs   . Microalbuminuria   . Muscle cramps    both legs  . Muscular degeneration    of right eye  . Nephrolithiasis   . Palpitations   . Personal history of radiation therapy      Subjective: No new complaints. No new problems. Slept well.   Objective: Vital signs in last 24 hours: Temp:  [98.3 F (36.8 C)-98.5 F (36.9 C)] 98.5 F (36.9 C) (09/07 0646) Pulse Rate:  [87-89] 89 (09/07 0646) Resp:  [16] 16 (09/07 0646) BP: (115-136)/(70-91) 136/91 (09/07 0646) SpO2:  [98 %] 98 % (09/07 0646) Weight change:  Last BM Date: 03/29/18  Intake/Output from previous day: 09/06 0701 - 09/07 0700 In: 720 [P.O.:720] Out: -  Last cbgs: CBG (last 3)  Recent Labs    03/31/18 1637 03/31/18 2206 04/01/18 0651  GLUCAP 163* 166* 178*   Patient Vitals for the past 24 hrs:  BP Temp Pulse Resp SpO2  04/01/18 0646 (!) 136/91 98.5 F (36.9 C) 89 16 98 %  03/31/18 1949 116/75 98.3 F (36.8 C) 89 16 98 %  03/31/18 1422 115/70 98.3 F (36.8  C) 87 - 98 %    Physical Exam General: No apparent distress   HEENT: not dry Lungs: Normal effort. Lungs clear to auscultation, no crackles or wheezes. Cardiovascular: Regular rate and rhythm, no edema Abdomen: S/NT/ND; BS(+) Musculoskeletal:  unchanged Neurological: No new neurological deficits with left hemiparesis, arm greater than leg  wounds: N/A    Extremities.  +1 edema Mental state: Alert, oriented, cooperative    Lab Results: BMET    Component Value Date/Time   NA 137 03/29/2018 0529   K 4.4 03/29/2018 0529   CL 105 03/29/2018 0529   CO2 23 03/29/2018 0529   GLUCOSE 208 (H) 03/29/2018 0529   BUN 35 (H) 03/29/2018 0529   CREATININE 1.92 (H) 03/29/2018 0529   CALCIUM 10.2 03/29/2018 0529   GFRNONAA 24 (L) 03/29/2018 0529   GFRAA 28 (L) 03/29/2018 0529   CBC    Component Value Date/Time   WBC 6.8 03/29/2018 0529   RBC 3.77 (L) 03/29/2018 0529   HGB 11.9 (L) 03/29/2018 0529   HCT 36.2 03/29/2018 0529   PLT 207 03/29/2018 0529   MCV 96.0 03/29/2018 0529  MCH 31.6 03/29/2018 0529   MCHC 32.9 03/29/2018 0529   RDW 13.2 03/29/2018 0529   LYMPHSABS 1.5 03/29/2018 0529   MONOABS 0.6 03/29/2018 0529   EOSABS 0.2 03/29/2018 0529   BASOSABS 0.0 03/29/2018 0529     Medications: I have reviewed the patient's current medications.  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  Continue dual antiplatelet therapy through September 20 Diabetes mellitus.  Reasonable control.  Continue mixed split insulin therapy Chronic kidney disease Hypertension.  Blood pressures a bit labile; will observe on present therapy Dyslipidemia continue statin therapy  Length of stay, days: 4  Marletta Lor , MD 04/01/2018, 9:29 AM

## 2018-04-02 DIAGNOSIS — E785 Hyperlipidemia, unspecified: Secondary | ICD-10-CM

## 2018-04-02 DIAGNOSIS — F4321 Adjustment disorder with depressed mood: Secondary | ICD-10-CM

## 2018-04-02 LAB — GLUCOSE, CAPILLARY
Glucose-Capillary: 172 mg/dL — ABNORMAL HIGH (ref 70–99)
Glucose-Capillary: 229 mg/dL — ABNORMAL HIGH (ref 70–99)
Glucose-Capillary: 212 mg/dL — ABNORMAL HIGH (ref 70–99)
Glucose-Capillary: 250 mg/dL — ABNORMAL HIGH (ref 70–99)

## 2018-04-02 NOTE — Progress Notes (Signed)
[name] is a 77 y.o. female [birth year] [id number]  Subjective: No new complaints. No new problems. Slept well. Feeling OK.  She is not tearful this morning  Objective: Vital signs in last 24 hours: Temp:  [98.2 F (36.8 C)-98.6 F (37 C)] 98.6 F (37 C) (09/08 1432) Pulse Rate:  [82-88] 88 (09/08 1432) Resp:  [16-18] 17 (09/08 1432) BP: (112-134)/(62-93) 112/67 (09/08 1432) SpO2:  [96 %-98 %] 96 % (09/08 1432) Weight change:  Last BM Date: 03/31/18  Intake/Output from previous day: 09/07 0701 - 09/08 0700 In: 480 [P.O.:480] Out: -  Last cbgs: CBG (last 3)  Recent Labs    04/01/18 2123 04/02/18 0641 04/02/18 1049  GLUCAP 177* 172* 229*     Physical Exam General: No apparent distress   HEENT: not dry Lungs: Normal effort. Lungs clear to auscultation, no crackles or wheezes. Cardiovascular: Regular rate and rhythm, no edema Abdomen: S/NT/ND; BS(+) Musculoskeletal:  unchanged Neurological: No new neurological deficits Wounds: N/A    Skin: clear  Aging changes Mental state: Alert, oriented, cooperative    Lab Results: BMET    Component Value Date/Time   NA 137 03/29/2018 0529   K 4.4 03/29/2018 0529   CL 105 03/29/2018 0529   CO2 23 03/29/2018 0529   GLUCOSE 208 (H) 03/29/2018 0529   BUN 35 (H) 03/29/2018 0529   CREATININE 1.92 (H) 03/29/2018 0529   CALCIUM 10.2 03/29/2018 0529   GFRNONAA 24 (L) 03/29/2018 0529   GFRAA 28 (L) 03/29/2018 0529   CBC    Component Value Date/Time   WBC 6.8 03/29/2018 0529   RBC 3.77 (L) 03/29/2018 0529   HGB 11.9 (L) 03/29/2018 0529   HCT 36.2 03/29/2018 0529   PLT 207 03/29/2018 0529   MCV 96.0 03/29/2018 0529   MCH 31.6 03/29/2018 0529   MCHC 32.9 03/29/2018 0529   RDW 13.2 03/29/2018 0529   LYMPHSABS 1.5 03/29/2018 0529   MONOABS 0.6 03/29/2018 0529   EOSABS 0.2 03/29/2018 0529   BASOSABS 0.0 03/29/2018 0529    Studies/Results: No results found.  Medications: I have reviewed the patient's current  medications.  Assessment/Plan:  1.  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  Left-sided weakness with facial droop and slurred speech-continue with CIR 2.  DVT prophylaxis with heparin 3.  Pain management with Tylenol PRN 4.  Depression.  Continue with Remeron, Effexor.  Ativan as needed anxiety.  Continue with Abilify 5.  Type 2 diabetes.  She is on NovoLog 70/30 and a sliding scale 6.  Chronic kidney disease stage III.  Will monitor her creatinine/BUN and maintain good hydration 7.  History of breast cancer 8.  Hyperlipidemia.  Continue with Lipitor      Length of stay, days: [place] , MD 04/02/2018, 3:57 PM

## 2018-04-02 NOTE — Plan of Care (Signed)
  Problem: Consults Goal: RH STROKE PATIENT EDUCATION Description See Patient Education module for education specifics  Outcome: Progressing Goal: Diabetes Guidelines if Diabetic/Glucose > 140 Description If diabetic or lab glucose is > 140 mg/dl - Initiate Diabetes/Hyperglycemia Guidelines & Document Interventions  Outcome: Progressing   Problem: RH BOWEL ELIMINATION Goal: RH STG MANAGE BOWEL WITH ASSISTANCE Description STG Manage Bowel with mod I Assistance.  Outcome: Progressing   Problem: RH BLADDER ELIMINATION Goal: RH STG MANAGE BLADDER WITH ASSISTANCE Description STG Manage Bladder With mod I Assistance  Outcome: Progressing   Problem: RH SKIN INTEGRITY Goal: RH STG SKIN FREE OF INFECTION/BREAKDOWN Description Maintain skin integrity with mod I assist   Outcome: Progressing   Problem: RH KNOWLEDGE DEFICIT Goal: RH STG INCREASE KNOWLEDGE OF DIABETES Description Patient able to state 2 S/S of hypoglycemia/hyperglycemia with cues and treatment  Outcome: Progressing Goal: RH STG INCREASE KNOWLEGDE OF HYPERLIPIDEMIA Description Patient able to state 2 direct ways to manage high cholesterol with cues.    Outcome: Progressing Goal: RH STG INCREASE KNOWLEDGE OF STROKE PROPHYLAXIS Description Patient will be able to describe medication and lifestyle factors that help prevent stroke with cues  Outcome: Progressing

## 2018-04-03 ENCOUNTER — Inpatient Hospital Stay (HOSPITAL_COMMUNITY): Payer: Medicare Other

## 2018-04-03 ENCOUNTER — Inpatient Hospital Stay (HOSPITAL_COMMUNITY): Payer: Medicare Other | Admitting: Occupational Therapy

## 2018-04-03 ENCOUNTER — Inpatient Hospital Stay (HOSPITAL_COMMUNITY): Payer: Medicare Other | Admitting: Physical Therapy

## 2018-04-03 LAB — GLUCOSE, CAPILLARY
Glucose-Capillary: 205 mg/dL — ABNORMAL HIGH (ref 70–99)
Glucose-Capillary: 192 mg/dL — ABNORMAL HIGH (ref 70–99)
Glucose-Capillary: 177 mg/dL — ABNORMAL HIGH (ref 70–99)
Glucose-Capillary: 203 mg/dL — ABNORMAL HIGH (ref 70–99)

## 2018-04-03 MED ORDER — INSULIN ASPART PROT & ASPART (70-30 MIX) 100 UNIT/ML ~~LOC~~ SUSP: 12.0000 [IU] | Freq: Two times a day (BID) | SUBCUTANEOUS | Status: DC

## 2018-04-03 MED ORDER — SENNOSIDES-DOCUSATE SODIUM 8.6-50 MG PO TABS
1.0000 | ORAL_TABLET | Freq: Two times a day (BID) | ORAL | Status: DC
  Administered 2018-04-03 – 2018-04-05 (×5): 1 via ORAL
  Filled 2018-04-03 (×5): qty 1

## 2018-04-03 MED ORDER — INSULIN ASPART PROT & ASPART (70-30 MIX) 100 UNIT/ML ~~LOC~~ SUSP
12.0000 [IU] | Freq: Two times a day (BID) | SUBCUTANEOUS | Status: DC
  Administered 2018-04-03 – 2018-04-07 (×9): 12 [IU] via SUBCUTANEOUS

## 2018-04-03 NOTE — Progress Notes (Signed)
Physical Therapy Session Note  Patient Details  Name: [name] MRN: [id number] Date of Birth: Jun 11, 1941  Today's Date: 04/03/2018 PT Individual Time: 0951-1102 PT Individual Time Calculation (min): 71 min   Short Term Goals: Week 1:  PT Short Term Goal 1 (Week 1): Pt will perform bed mobility with S from flat bed PT Short Term Goal 2 (Week 1): Pt will perform transfers with consistent S PT Short Term Goal 3 (Week 1): Pt will ambulate x150' with LRAD and S PT Short Term Goal 4 (Week 1): Pt will ascend/descend 3 steps no rails with minA  Skilled Therapeutic Interventions/Progress Updates:  Pt received in w/c & agreeable to tx, reporting 7/10 R side HA & meds requested & administered during session. Transported pt to gym via w/c total assist for time management. Gait training x 100 ft with RW & min assist with min cuing for L attention & cuing for increased L toe clearance and heel strike with pt requiring ongoing cuing to attend to task; pt with ability to return demonstrate heel strike and toe clearance with cuing. Pt ambulates gym<>apartment with RW & steady assist and completes transfer from low compliant couch initially requiring max assist 2/2 posterior LOB but then able to complete with steady assist with cuing to push up on couch vs pulling to stand with RW. Pt completed bed mobility with supervision and extra time with cuing for technique for rolling R but pt unable to completely roll to R sidelying. Pt doffed pillowcases with supervision but required assistance to thread pillow case on pillow. Pt performed side stepping at rail in hallway to L & R with min assist for balance but max multimodal cuing to maintain BUE support in front of her as she attempted to leave RUE behind, cuing for upright posture, and cuing for overall stepping technique with pt demonstrating significant difficulty combining all parts of task with pt reporting "this is the hardest thing I've done". Pt then performed  retrograde gait with rail for UE support with cuing for increased step length with task focusing on L NMR & balance. Focused on standing balance with pt engaging in dynavision without UE support with cuing to utilize LUE for forced use for NMR & strengthening with pt able to perform tasks for 2 minutes + 2 minutes with seated rest break in between. Pt unable to fully flex L shoulder but reports this was baseline PTA. At end of session pt left sitting in w/c in room with chair alarm activated & all needs in reach.    Therapy Documentation Precautions:  Precautions Precautions: Fall Precaution Comments: mild L inattention Restrictions Weight Bearing Restrictions: No   See Function Navigator for Current Functional Status.   Therapy/Group: Individual Therapy  Waunita Schooner 04/03/2018, 12:18 PM

## 2018-04-03 NOTE — Progress Notes (Signed)
Occupational Therapy Session Note  Patient Details  Name: [name] MRN: [id number] Date of Birth: 1940/08/03  Today's Date: 04/03/2018 OT Individual Time: 1334-1430 OT Individual Time Calculation (min): 56 min    Short Term Goals: Week 1:  OT Short Term Goal 1 (Week 1): Pt will complete bathing with min assist at sit > stand level OT Short Term Goal 2 (Week 1): Pt will complete LB dressing with min assist OT Short Term Goal 3 (Week 1): Pt will complete 2 grooming tasks in standing with contact guard for increased activity tolerance  Skilled Therapeutic Interventions/Progress Updates:    Treatment session with focus on functional mobility, use of LUE, and awareness of deficits.  Pt received upright in w/c reporting desire to have a cup of coffee.  Pt ambulated 20' with RW with min guard to break room to make a cup of coffee from [place].  Min guard throughout coffee making task, pt utilizing LUE to obtain k-cup and individual creamers.  Pt utilized BUE to open sugar and creamer packets and then stabilized cup with Lt hand while stirring with Rt.  Engaged in pill box assessment with focus on problem solving, awareness of errors, and fine motor control.  Pt dropping many "pills" at beginning of task then transitioned to utilizing dominant hand to sort pills.  Pt required 11:53 to complete task and even then made errors of omission.  Pt had initially made errors of commission (excessive pills in each compartment) but was able to talk aloud through prescription label to correct without cues from therapist.  Educated on concerns with omission and commission errors.  Pt demonstrating intellectual to emergent awareness by voicing concerns regarding mistaking prescriptions.  Therapy Documentation Precautions:  Precautions Precautions: Fall Precaution Comments: mild L inattention Restrictions Weight Bearing Restrictions: No General:   Vital Signs: Therapy Vitals Temp: 98.6 F (37 C) Temp  Source: Oral Pulse Rate: 95 Resp: 18 BP: 138/72 Patient Position (if appropriate): Sitting Oxygen Therapy SpO2: 99 % O2 Device: Room Air Pain:   Pt with no c/o pain  See Function Navigator for Current Functional Status.   Therapy/Group: Individual Therapy  Simonne Come 04/03/2018, 3:58 PM

## 2018-04-03 NOTE — Progress Notes (Signed)
Speech Language Pathology Daily Session Note  Patient Details  Name: [name] MRN: [id number] Date of Birth: Mar 10, 1941  Today's Date: 04/03/2018 SLP Individual Time: 5916-3846 SLP Individual Time Calculation (min): 60 min  Short Term Goals: Week 1: SLP Short Term Goal 1 (Week 1): Pt will utilize speech intelligibility strategies with Mod I to produce > 95% intelligible speech at the complex conversation level.  SLP Short Term Goal 1 - Progress (Week 1): Progressing toward goal SLP Short Term Goal 2 (Week 1): Pt will demonstrate semi-problem solving during functional taskswith supervision cues.  SLP Short Term Goal 2 - Progress (Week 1): Revised due to lack of progress(baseline level required intermittent supervision and assistance with complex problem solving) SLP Short Term Goal 3 (Week 1): Pt will demonstrate selective attention in moderately distracting environment for ~ 45 minutes with supervision cues.  SLP Short Term Goal 4 (Week 1): Pt will demonstrate self-monitoring and correcting of functional errors during problem solving tasks with supervision A verbal and question cues. SLP Short Term Goal 4 - Progress (Week 1): Other (comment)(goal added to reflect error awareness in problem solving) SLP Short Term Goal 5 (Week 1): Pt will demonstrate anticipatory awareness by listing safe vs. unsafe tasks with supervision cues.   Skilled Therapeutic Interventions:Skilled ST services focused on cognitive skills. SLP facilitated semi-complex problem solving skills utilizing personal medication management, pt required min-supervision A verbal cues for functional and min A verbal cues for verbal problem solving in task. Pt required supervision A question cues for error awareness/correction. Pt demonstrated selective attention for 45 minutes with min A verbal cues for external distractions and mod A internal distractions. SLP adjusted STG and LTG to better reflect pt's current ability and baseline  based on pervious discussion with pt and pt's son, pt agreed with changes Pt was left in room with call bell within reach and bed alarm set. Recommend to continue skilled ST services.      Function:  Eating Eating                 Cognition Comprehension Comprehension assist level: Understands basic 90% of the time/cues < 10% of the time  Expression   Expression assist level: Expresses basic 90% of the time/requires cueing < 10% of the time.  Social Interaction Social Interaction assist level: Interacts appropriately 90% of the time - Needs monitoring or encouragement for participation or interaction.  Problem Solving Problem solving assist level: Solves complex 90% of the time/cues < 10% of the time;Solves basic problems with no assist  Memory Memory assist level: Recognizes or recalls 75 - 89% of the time/requires cueing 10 - 24% of the time    Pain Pain Assessment Pain Score: 0-No pain  Therapy/Group: Individual Therapy  Redford Behrle  [facility] 04/03/2018, 4:09 PM

## 2018-04-03 NOTE — Progress Notes (Signed)
Subjective/Complaints:   Discussed emotional lability when challenged last week per SLP who spoke to son and felt pt could not achieve Mod I for certain activities  ROS  Denies CP, SOB, N/V/D  Objective: Vital Signs: Blood pressure (!) 146/96, pulse 100, temperature 98.5 F (36.9 C), temperature source Oral, resp. rate 18, height 5\' 7"  (1.702 m), weight 85 kg, SpO2 99 %. No results found. Results for orders placed or performed during the hospital encounter of 03/28/18 (from the past 72 hour(s))  Glucose, capillary     Status: Abnormal   Collection Time: 03/31/18 12:04 PM  Result Value Ref Range   Glucose-Capillary 157 (H) 70 - 99 mg/dL   Comment 1 Notify RN   Glucose, capillary     Status: Abnormal   Collection Time: 03/31/18  4:37 PM  Result Value Ref Range   Glucose-Capillary 163 (H) 70 - 99 mg/dL   Comment 1 Notify RN   Glucose, capillary     Status: Abnormal   Collection Time: 03/31/18 10:06 PM  Result Value Ref Range   Glucose-Capillary 166 (H) 70 - 99 mg/dL   Comment 1 Notify RN   Glucose, capillary     Status: Abnormal   Collection Time: 04/01/18  6:51 AM  Result Value Ref Range   Glucose-Capillary 178 (H) 70 - 99 mg/dL   Comment 1 Document in Chart   Glucose, capillary     Status: Abnormal   Collection Time: 04/01/18 12:07 PM  Result Value Ref Range   Glucose-Capillary 206 (H) 70 - 99 mg/dL  Glucose, capillary     Status: Abnormal   Collection Time: 04/01/18  4:41 PM  Result Value Ref Range   Glucose-Capillary 234 (H) 70 - 99 mg/dL  Glucose, capillary     Status: Abnormal   Collection Time: 04/01/18  9:23 PM  Result Value Ref Range   Glucose-Capillary 177 (H) 70 - 99 mg/dL   Comment 1 Document in Chart   Glucose, capillary     Status: Abnormal   Collection Time: 04/02/18  6:41 AM  Result Value Ref Range   Glucose-Capillary 172 (H) 70 - 99 mg/dL   Comment 1 Document in Chart   Glucose, capillary     Status: Abnormal   Collection Time: 04/02/18 10:49 AM   Result Value Ref Range   Glucose-Capillary 229 (H) 70 - 99 mg/dL  Glucose, capillary     Status: Abnormal   Collection Time: 04/02/18  4:53 PM  Result Value Ref Range   Glucose-Capillary 212 (H) 70 - 99 mg/dL  Glucose, capillary     Status: Abnormal   Collection Time: 04/02/18  9:38 PM  Result Value Ref Range   Glucose-Capillary 250 (H) 70 - 99 mg/dL  Glucose, capillary     Status: Abnormal   Collection Time: 04/03/18  6:27 AM  Result Value Ref Range   Glucose-Capillary 205 (H) 70 - 99 mg/dL     HEENT: normal Cardio: RRR and no murmur Resp: CTA B/L and unlabored GI: BS positive and NT, ND Extremity:  No Edema Skin:   Intact and Other IV site CDI Neuro: Alert/Oriented, Normal Sensory, Abnormal Motor 4= Left delt bi tri grip HF, KE 3- L ankle DF, Abnormal FMC Ataxic/ dec FMC and Dysarthric Musc/Skel:  Other RIght sided neck pain with ROM Gen NAD   Assessment/Plan: 1. 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  Physiatrist is providing close team supervision and 24 hour management of active medical problems listed below. Physiatrist and rehab team continue to assess barriers to discharge/monitor patient progress toward functional and medical goals. FIM: Function - Bathing Position: Shower Body parts bathed by patient: Right arm, Left arm, Chest, Abdomen, Front perineal area, Right upper leg, Left upper leg Body parts bathed by helper: Buttocks, Right lower leg, Left lower leg, Back Assist Level: (Mod assist)  Function- Upper Body Dressing/Undressing What is the patient wearing?: Pull over shirt/dress Pull over shirt/dress - Perfomed by patient: Thread/unthread right sleeve, Thread/unthread left sleeve, Put head through opening, Pull shirt over trunk Pull over shirt/dress - Perfomed by helper: Pull shirt over trunk Assist Level: Set  up Set up : To obtain clothing/put away Function - Lower Body Dressing/Undressing What is the patient wearing?: Pants, Underwear, Maryln Manuel, Shoes Position: Education officer, museum at [employer] - Performed by patient: Thread/unthread right underwear leg, Thread/unthread left underwear leg, Pull underwear up/down Pants- Performed by patient: Thread/unthread right pants leg, Thread/unthread left pants leg, Pull pants up/down Pants- Performed by helper: Pull pants up/down Non-skid slipper socks- Performed by patient: Don/doff right sock, Don/doff left sock Non-skid slipper socks- Performed by helper: Don/doff right sock, Don/doff left sock Shoes - Performed by patient: Don/doff right shoe, Don/doff left shoe, Fasten right, Fasten left TED Hose - Performed by helper: Don/doff right TED hose, Don/doff left TED hose Assist for footwear: Supervision/touching assist Assist for lower body dressing: Supervision or verbal cues  Function - Toileting Toileting steps completed by patient: Adjust clothing prior to toileting, Performs perineal hygiene, Adjust clothing after toileting Toileting steps completed by helper: Adjust clothing prior to toileting, Performs perineal hygiene, Adjust clothing after toileting Toileting Assistive Devices: Grab bar or rail Assist level: Touching or steadying assistance (Pt.75%)  Function - Air cabin crew transfer assistive device: Grab bar Assist level to toilet: Touching or steadying assistance (Pt > 75%) Assist level from toilet: Touching or steadying assistance (Pt > 75%) Assist level to bedside commode (at bedside): Touching or steadying assistance (Pt > 75%) Assist level from bedside commode (at bedside): Moderate assist (Pt 50 - 74%/lift or lower)(per Elby Beck, NT)  Function - Chair/bed transfer Chair/bed transfer method: Stand pivot Chair/bed transfer assist level: Supervision or verbal cues Chair/bed transfer assistive device: Armrests,  Bedrails Chair/bed transfer details: Verbal cues for sequencing, Verbal cues for technique  Function - Locomotion: Wheelchair Will patient use wheelchair at discharge?: No Type: Manual Max wheelchair distance: 75 Assist Level: Supervision or verbal cues Assist Level: Supervision or verbal cues Wheel 150 feet activity did not occur: Safety/medical concerns(fatigue) Turns around,maneuvers to table,bed, and toilet,negotiates 3% grade,maneuvers on rugs and over doorsills: No Function - Locomotion: Ambulation Assistive device: Walker-rolling Max distance: 100 ft Assist level: Touching or steadying assistance (Pt > 75%) Assist level: Touching or steadying assistance (Pt > 75%) Assist level: Touching or steadying assistance (Pt > 75%) Walk 150 feet activity did not occur: Safety/medical concerns(fatigue) Walk 10 feet on uneven surfaces activity did not occur: Safety/medical concerns Assist level: Touching or steadying assistance (Pt > 75%)  Function - Comprehension Comprehension: Auditory Comprehension assist level: Understands basic 90% of the time/cues < 10% of the time  Function - Expression Expression: Verbal Expression assist level: Expresses basic 90% of the time/requires cueing < 10% of the time.  Function - Social Interaction Social Interaction assist level: Interacts appropriately 90% of the time - Needs monitoring or encouragement for participation or interaction.  Function - Problem  Solving Problem solving assist level: Solves basic 90% of the time/requires cueing < 10% of the time  Function - Memory Memory assist level: More than reasonable amount of time Patient normally able to recall (first 3 days only): Current season, Location of own room, Staff names and faces, That he or she is in a hospital  Medical Problem List and Plan: 1.  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  DVT Prophylaxis/Anticoagulation: Subcutaneous heparin, no Lovenox due to Cr Cl 3. Pain Management: Tylenol as needed  4. Mood: Remeron 15 mg nightly, Effexor or 75 mg every other day, Ativan 0.4 mg 4 times daily, Abilify 5 mg daily Dr Sima Matas to follow- some lability reported when challanged per SLP- focus on functional activities 5. Neuropsych: This patient is capable of making decisions on her own behalf. 6. Skin/Wound Care: Routine skin checks 7. Fluids/Electrolytes/Nutrition: Routine in and outs with follow-up chemistries 8.  Diabetes mellitus.  Hemoglobin A1c 8.6.  NovoLog 70/30 10 units twice daily.  Check blood sugars before meals and at bedtime CBG (last 3)  Recent Labs    04/02/18 1653 04/02/18 2138 04/03/18 0627  GLUCAP 212* 250* 205*  CBG controlled 9/9- in hospital range of <180, will increase to 12 U 9.  History of tachycardia with ablation 2007.  No chest pain or shortness of breath. 10.  CKD stage III.  Baseline creatinine 1.95.  Follow-up chemistries Creat is baseline BUN mildly elevated, Cr Cl <30 11.  History of breast cancer with lumpectomy.  Follow-up outpatient 12.  Hyperlipidemia.  Lipitor- 80mg    LOS (Days) 6 A FACE TO FACE EVALUATION WAS PERFORMED  Charlett Blake 04/03/2018, 8:48 AM

## 2018-04-04 ENCOUNTER — Inpatient Hospital Stay (HOSPITAL_COMMUNITY): Payer: Medicare Other | Admitting: Occupational Therapy

## 2018-04-04 ENCOUNTER — Inpatient Hospital Stay (HOSPITAL_COMMUNITY): Payer: Medicare Other | Admitting: Physical Therapy

## 2018-04-04 ENCOUNTER — Inpatient Hospital Stay (HOSPITAL_COMMUNITY): Payer: Medicare Other

## 2018-04-04 ENCOUNTER — Encounter (HOSPITAL_COMMUNITY): Payer: Medicare Other | Admitting: Psychology

## 2018-04-04 LAB — GLUCOSE, CAPILLARY
Glucose-Capillary: 155 mg/dL — ABNORMAL HIGH (ref 70–99)
Glucose-Capillary: 205 mg/dL — ABNORMAL HIGH (ref 70–99)
Glucose-Capillary: 172 mg/dL — ABNORMAL HIGH (ref 70–99)
Glucose-Capillary: 176 mg/dL — ABNORMAL HIGH (ref 70–99)

## 2018-04-04 NOTE — Progress Notes (Signed)
Physical Therapy Session Note  Patient Details  Name: [name] MRN: [id number] Date of Birth: 1941/03/03  Today's Date: 04/04/2018 PT Individual Time: 0900-0930 PT Individual Time Calculation (min): 30 min   Short Term Goals: Week 1:  PT Short Term Goal 1 (Week 1): Pt will perform bed mobility with S from flat bed PT Short Term Goal 2 (Week 1): Pt will perform transfers with consistent S PT Short Term Goal 3 (Week 1): Pt will ambulate x150' with LRAD and S PT Short Term Goal 4 (Week 1): Pt will ascend/descend 3 steps no rails with minA  Skilled Therapeutic Interventions/Progress Updates:    Patient received up in chair, very pleasant and willing to participate in skilled PT session. Able to complete functional transfers with no device and Min guard, gait trained 151f with no device and Min guard-MinA for balance/steadying this morning. Focused on dynamic balance based interventions in parallel bars with min guard and mod cues to optimize performance and safety. Finished session with further gait training approximately 1577fwith no device, again min guard to MinA for balance/safety, Mod cues for gait performance and gait efficiency. Worked on sit to stand with use of B UEs for transfers. [facility] active, all needs otherwise met.   Therapy Documentation Precautions:  Precautions Precautions: Fall Precaution Comments: mild L inattention Restrictions Weight Bearing Restrictions: No General:   Vital Signs:   Pain: Pain Assessment Pain Scale: 0-10 Pain Score: 0-No pain   See Function Navigator for Current Functional Status.   Therapy/Group: Individual Therapy  KrDeniece ReeT, DPT, CBIS  Supplemental Physical Therapist [facility]  Pager [phone] [phone] 04/04/2018, 10:55 AM

## 2018-04-04 NOTE — Plan of Care (Signed)
  Problem: Consults Goal: RH STROKE PATIENT EDUCATION Description See Patient Education module for education specifics  Outcome: Progressing Goal: Diabetes Guidelines if Diabetic/Glucose > 140 Description If diabetic or lab glucose is > 140 mg/dl - Initiate Diabetes/Hyperglycemia Guidelines & Document Interventions  Outcome: Progressing   Problem: RH BOWEL ELIMINATION Goal: RH STG MANAGE BOWEL WITH ASSISTANCE Description STG Manage Bowel with mod I Assistance.  Outcome: Progressing   Problem: RH BLADDER ELIMINATION Goal: RH STG MANAGE BLADDER WITH ASSISTANCE Description STG Manage Bladder With mod I Assistance  Outcome: Progressing   Problem: RH SKIN INTEGRITY Goal: RH STG SKIN FREE OF INFECTION/BREAKDOWN Description Maintain skin integrity with mod I assist   Outcome: Progressing   Problem: RH KNOWLEDGE DEFICIT Goal: RH STG INCREASE KNOWLEDGE OF DIABETES Description Patient able to state 2 S/S of hypoglycemia/hyperglycemia with cues and treatment  Outcome: Progressing Goal: RH STG INCREASE KNOWLEGDE OF HYPERLIPIDEMIA Description Patient able to state 2 direct ways to manage high cholesterol with cues.    Outcome: Progressing Goal: RH STG INCREASE KNOWLEDGE OF STROKE PROPHYLAXIS Description Patient will be able to describe medication and lifestyle factors that help prevent stroke with cues  Outcome: Progressing

## 2018-04-04 NOTE — Consult Note (Signed)
Neuropsychological Consultation   Patient:   [name]   DOB:   May 11, 1941  MR Number:  [id number]  Location:  [facility] [id number] [place] [address] Dept: [place]: [phone]           Date of Service:   04/04/2018  Start Time:   1 PM End Time:   2 PM  Provider/Observer:  Ilean Skill, Psy.D.       Clinical Neuropsychologist       Billing Code/Service: [phone] 4 Units  Chief Complaint:    [name] is a 77 year old female with history of tachycardia status post ablation 2007, diabetes, right breast cancer status post lumpectomy as well as colon cancer, CKD stage III.  Present 03/24/2018 with left-sided weakness and facial droop as well as slurred speech.  MRI/MRA showed nonhemorrhagic 61m right paramedian pontine infarct.  Patient has prior history of depression/anxiety.  Reason for Service:  The patient was referred for neuropsychology consult due to coping and anxiety issues.  Below is the HPI for the current admission.  HPI: DBeatriz Knapp a 77year old right-handed female with history of tachycardia status post ablation 2007, diabetes mellitus, right breast cancer status post lumpectomy as well as colon cancer, CKD stage III.  Per chart review patient lives alone was independent prior to admission and driving.  Family does provide fDoctor, hospital  One level town home.  Son is a lField seismologist  Presented 03/24/2018 with left-sided weakness and facial droop as well as slurred speech.  Cranial CT scan negative for acute changes.  Patient did receive TPA.  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  No significant proximal stenosis aneurysm or branch vessel occlusion.  Echocardiogram with ejection fraction of 684%grade 1 diastolic dysfunction.  Carotid Dopplers with no ICA stenosis.  Neurology consulted presently on  aspirin and Plavix for CVA prophylaxis.  Subcutaneous heparin for DVT prophylaxis.  Patient is tolerating a regular diet.  Physical and occupational therapy evaluations completed with recommendations of physical medicine rehab consult.  Patient was admitted for a comprehensive rehab program  Current Status:  DDaralyn Bertdoes report that anxiety has be persistent but improved over past few days as she is able to see her motor improvements.  She denies significant depression currently.  Behavioral Observation: DNaela Nodal presents as a 77y.o.-year-old Right Caucasian Female who appeared her stated age. her dress was Appropriate and she was Well Groomed and her manners were Appropriate to the situation.  her participation was indicative of Appropriate and Attentive behaviors.  There were any physical disabilities noted.  she displayed an appropriate level of cooperation and motivation.     Interactions:    Active Appropriate and Attentive  Attention:   within normal limits and attention span and concentration were age appropriate  Memory:   within normal limits; recent and remote memory intact  Visuo-spatial:  not examined  Speech (Volume):  normal  Speech:   normal; normal  Thought Process:  Coherent and Relevant  Though Content:  WNL; not suicidal and not homicidal  Orientation:   person, place, time/date and situation  Judgment:   Good  Planning:   Good  Affect:    Anxious  Mood:    Anxious  Insight:   Good  Intelligence:   high  Medical History:   Past Medical History:  Diagnosis Date  . Anxiety   .  Back ache   . Breast cancer ([place])    right  . Bronchitis   . Cancer of colon ([place])   . Depression   . DM (diabetes mellitus) ([place])   . Dyspnea on exertion   . Fall on steps    age 68 feel down iron stairs couldnot move leg for 6 mths  . Family history of primary TB   . Fracture of ankle, closed 2014   left  . History of chicken pox   . History of hysterectomy  04/1980  . History of primary TB   . Hyperlipidemia   . Hypertension   . Kidney disease, chronic, stage III (moderate, EGFR 30-59 ml/min) ([place]) 2017  . Kidney disorder    reduction function  . Kidney stones   . Knee pain, right   . Lung abnormality    age 3 patient had pna spot on lungs   . Microalbuminuria   . Muscle cramps    both legs  . Muscular degeneration    of right eye  . Nephrolithiasis   . Palpitations   . Personal history of radiation therapy    Psychiatric History:  Patient has prior history of depression and anxieity.    Family Med/Psych History:  Family History  Problem Relation Age of Onset  . Peripheral Artery Disease Mother 59       HX OF POOR CIRCULATION, SMOKING, LEG AMPUTATION  . Cancer Father        cancer of spine  . Asthma Brother 42  . Kidney Stones Brother   . Breast cancer Neg Hx     Risk of Suicide/Violence: virtually non-existent Patient denies SI or HI.  Impression/DX:  [name] is a 77 year old female with history of tachycardia status post ablation 2007, diabetes, right breast cancer status post lumpectomy as well as colon cancer, CKD stage III.  Present 03/24/2018 with left-sided weakness and facial droop as well as slurred speech.  MRI/MRA showed nonhemorrhagic 68m right paramedian pontine infarct.  Patient has prior history of depression/anxiety.  [name] does report that anxiety has be persistent but improved over past few days as she is able to see her motor improvements.  She denies significant depression currently.   Disposition/Plan:  Will see the patient again to follow-up with symptoms of anxiety.        Electronically Signed   _______________________ JIlean Skill Psy.D.

## 2018-04-04 NOTE — Progress Notes (Signed)
Occupational Therapy Session Note  Patient Details  Name: [name] MRN: [id number] Date of Birth: 09-18-1940  Today's Date: 04/04/2018 OT Individual Time: 1000-1100 OT Individual Time Calculation (min): 60 min    Short Term Goals: Week 1:  OT Short Term Goal 1 (Week 1): Pt will complete bathing with min assist at sit > stand level OT Short Term Goal 2 (Week 1): Pt will complete LB dressing with min assist OT Short Term Goal 3 (Week 1): Pt will complete 2 grooming tasks in standing with contact guard for increased activity tolerance  Skilled Therapeutic Interventions/Progress Updates:    Treatment session with focus on ADL retraining and increased independence during self-care tasks.  Pt received upright in recliner reporting fatigue but willing to engage in bathing at shower level.  Pt ambulated to room shower with RW and contact guard.  Pt completed bathing with supervision/cues and contact guard when standing to wash buttocks.  Engaged in education on various techniques to increase independence and safety during bathing at shower level.  Pt returned to room and dressed from EOB with increased time for LB dressing, requiring assistance to don TEDS and hospital socks due to fatigue.  Engaged in discussion regarding d/c planning with focus on pt concerns regarding safe medication management and reporting awareness that she is not physically ready for d/c yet.  Pt left upright in w/c awaiting SLP.  Therapy Documentation Precautions:  Precautions Precautions: Fall Precaution Comments: mild L inattention Restrictions Weight Bearing Restrictions: No General:   Vital Signs: Therapy Vitals Temp: 97.8 F (36.6 C) Temp Source: Oral Pulse Rate: 87 Resp: 17 BP: 127/69 Patient Position (if appropriate): Lying Oxygen Therapy SpO2: 100 % O2 Device: Room Air Pain:  Pt with no c/o pain  See Function Navigator for Current Functional Status.   Therapy/Group: Individual  Therapy  Simonne Come 04/04/2018, 3:51 PM

## 2018-04-04 NOTE — Progress Notes (Signed)
Subjective/Complaints:   Pt c/o constipation, she has not had BM x 4 d per her report.  Denies abd pain, no appetite loss  PMH of colon ca 10 yrs ago  ROS  Denies CP, SOB, N/V/D  Objective: Vital Signs: Blood pressure 123/71, pulse 88, temperature 98.8 F (37.1 C), temperature source Oral, resp. rate 18, height 5\' 7"  (1.702 m), weight 85 kg, SpO2 100 %. No results found. Results for orders placed or performed during the hospital encounter of 03/28/18 (from the past 72 hour(s))  Glucose, capillary     Status: Abnormal   Collection Time: 04/01/18 12:07 PM  Result Value Ref Range   Glucose-Capillary 206 (H) 70 - 99 mg/dL  Glucose, capillary     Status: Abnormal   Collection Time: 04/01/18  4:41 PM  Result Value Ref Range   Glucose-Capillary 234 (H) 70 - 99 mg/dL  Glucose, capillary     Status: Abnormal   Collection Time: 04/01/18  9:23 PM  Result Value Ref Range   Glucose-Capillary 177 (H) 70 - 99 mg/dL   Comment 1 Document in Chart   Glucose, capillary     Status: Abnormal   Collection Time: 04/02/18  6:41 AM  Result Value Ref Range   Glucose-Capillary 172 (H) 70 - 99 mg/dL   Comment 1 Document in Chart   Glucose, capillary     Status: Abnormal   Collection Time: 04/02/18 10:49 AM  Result Value Ref Range   Glucose-Capillary 229 (H) 70 - 99 mg/dL  Glucose, capillary     Status: Abnormal   Collection Time: 04/02/18  4:53 PM  Result Value Ref Range   Glucose-Capillary 212 (H) 70 - 99 mg/dL  Glucose, capillary     Status: Abnormal   Collection Time: 04/02/18  9:38 PM  Result Value Ref Range   Glucose-Capillary 250 (H) 70 - 99 mg/dL  Glucose, capillary     Status: Abnormal   Collection Time: 04/03/18  6:27 AM  Result Value Ref Range   Glucose-Capillary 205 (H) 70 - 99 mg/dL  Glucose, capillary     Status: Abnormal   Collection Time: 04/03/18 11:59 AM  Result Value Ref Range   Glucose-Capillary 192 (H) 70 - 99 mg/dL  Glucose, capillary     Status: Abnormal   Collection  Time: 04/03/18  4:50 PM  Result Value Ref Range   Glucose-Capillary 177 (H) 70 - 99 mg/dL  Glucose, capillary     Status: Abnormal   Collection Time: 04/03/18  9:31 PM  Result Value Ref Range   Glucose-Capillary 203 (H) 70 - 99 mg/dL  Glucose, capillary     Status: Abnormal   Collection Time: 04/04/18  7:07 AM  Result Value Ref Range   Glucose-Capillary 155 (H) 70 - 99 mg/dL     HEENT: normal Cardio: RRR and no murmur Resp: CTA B/L and unlabored GI: BS positive and NT, ND Extremity:  No Edema Skin:   Intact and Other IV site CDI Neuro: Alert/Oriented, Normal Sensory, Abnormal Motor 4= Left delt bi tri grip HF, KE 4- L ankle DF, Abnormal FMC Ataxic/ dec FMC and Dysarthric Musc/Skel:  Other RIght sided neck pain with ROM Gen NAD   Assessment/Plan: 1. 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 Physiatrist is providing close team supervision and 24 hour management of active medical problems listed below. Physiatrist and rehab team continue to assess barriers  to discharge/monitor patient progress toward functional and medical goals. FIM: Function - Bathing Position: Shower Body parts bathed by patient: Right arm, Left arm, Chest, Abdomen, Front perineal area, Right upper leg, Left upper leg Body parts bathed by helper: Buttocks, Right lower leg, Left lower leg, Back Assist Level: (Mod assist)  Function- Upper Body Dressing/Undressing What is the patient wearing?: Pull over shirt/dress Pull over shirt/dress - Perfomed by patient: Thread/unthread right sleeve, Thread/unthread left sleeve, Put head through opening, Pull shirt over trunk Pull over shirt/dress - Perfomed by helper: Pull shirt over trunk Assist Level: Set up Set up : To obtain clothing/put away Function - Lower Body Dressing/Undressing What is the patient wearing?: Pants,  Underwear, Maryln Manuel, Shoes Position: Education officer, museum at [employer] - Performed by patient: Thread/unthread right underwear leg, Thread/unthread left underwear leg, Pull underwear up/down Pants- Performed by patient: Thread/unthread right pants leg, Thread/unthread left pants leg, Pull pants up/down Pants- Performed by helper: Pull pants up/down Non-skid slipper socks- Performed by patient: Don/doff right sock, Don/doff left sock Non-skid slipper socks- Performed by helper: Don/doff right sock, Don/doff left sock Shoes - Performed by patient: Don/doff right shoe, Don/doff left shoe, Fasten right, Fasten left TED Hose - Performed by helper: Don/doff right TED hose, Don/doff left TED hose Assist for footwear: Supervision/touching assist Assist for lower body dressing: Supervision or verbal cues  Function - Toileting Toileting steps completed by patient: Adjust clothing prior to toileting, Performs perineal hygiene, Adjust clothing after toileting Toileting steps completed by helper: Adjust clothing prior to toileting, Performs perineal hygiene, Adjust clothing after toileting Toileting Assistive Devices: Grab bar or rail Assist level: Touching or steadying assistance (Pt.75%)  Function - Air cabin crew transfer assistive device: Grab bar, Walker Assist level to toilet: Touching or steadying assistance (Pt > 75%) Assist level from toilet: Touching or steadying assistance (Pt > 75%) Assist level to bedside commode (at bedside): Touching or steadying assistance (Pt > 75%) Assist level from bedside commode (at bedside): Moderate assist (Pt 50 - 74%/lift or lower)(per Elby Beck, NT)  Function - Chair/bed transfer Chair/bed transfer method: Stand pivot Chair/bed transfer assist level: Supervision or verbal cues Chair/bed transfer assistive device: Armrests, Bedrails Chair/bed transfer details: Verbal cues for sequencing, Verbal cues for technique  Function - Locomotion:  Wheelchair Will patient use wheelchair at discharge?: No Type: Manual Max wheelchair distance: 75 Assist Level: Supervision or verbal cues Assist Level: Supervision or verbal cues Wheel 150 feet activity did not occur: Safety/medical concerns(fatigue) Turns around,maneuvers to table,bed, and toilet,negotiates 3% grade,maneuvers on rugs and over doorsills: No Function - Locomotion: Ambulation Assistive device: Walker-rolling Max distance: 100 ft  Assist level: Touching or steadying assistance (Pt > 75%) Assist level: Touching or steadying assistance (Pt > 75%) Assist level: Touching or steadying assistance (Pt > 75%) Walk 150 feet activity did not occur: Safety/medical concerns(fatigue) Walk 10 feet on uneven surfaces activity did not occur: Safety/medical concerns Assist level: Touching or steadying assistance (Pt > 75%)  Function - Comprehension Comprehension: Auditory Comprehension assist level: Understands basic 90% of the time/cues < 10% of the time  Function - Expression Expression: Verbal Expression assist level: Expresses basic 90% of the time/requires cueing < 10% of the time.  Function - Social Interaction Social Interaction assist level: Interacts appropriately 90% of the time - Needs monitoring or encouragement for participation or interaction.  Function - Problem Solving Problem solving assist level: Solves basic 90% of the time/requires cueing < 10% of the time  Function - Memory Memory  assist level: Recognizes or recalls 90% of the time/requires cueing < 10% of the time Patient normally able to recall (first 3 days only): Current season, Location of own room, Staff names and faces, That he or she is in a hospital  Medical Problem List and Plan: 1.  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  DVT Prophylaxis/Anticoagulation: Subcutaneous heparin 500U TID, no Lovenox due to Cr Cl 3. Pain Management: Tylenol as needed  4. Mood: Remeron 15 mg nightly, Effexor or 75 mg every other day, Ativan 0.4 mg 4 times daily, Abilify 5 mg daily Dr Sima Matas to follow- some lability reported when challanged per SLP- focus on functional activities 5. Neuropsych: This patient is capable of making decisions on her own behalf. 6. Skin/Wound Care: Routine skin checks 7. Fluids/Electrolytes/Nutrition: Routine in and outs with follow-up chemistries 8.  Diabetes mellitus.  Hemoglobin A1c 8.6.  NovoLog 70/30 10 units twice daily.  Check blood sugars before meals and at bedtime CBG (last 3)  Recent Labs    04/03/18 1650 04/03/18 2131 04/04/18 0707  GLUCAP 177* 203* 155*  CBG controlled 9/9- in hospital range of <180, will increase to 12 U 9.  History of tachycardia with ablation 2007.  No chest pain or shortness of breath. 10.  CKD stage III.  Baseline creatinine 1.95.  Follow-up chemistries Creat is baseline BUN mildly elevated, Cr Cl <30 11.  History of breast cancer with lumpectomy.  Follow-up outpatient 12.  Hyperlipidemia.  Lipitor- 80mg    LOS (Days) 7 A FACE TO FACE EVALUATION WAS PERFORMED  Charlett Blake 04/04/2018, 9:45 AM

## 2018-04-04 NOTE — Progress Notes (Signed)
Physical Therapy Session Note  Patient Details  Name: [name] MRN: [id number] Date of Birth: 11-28-1940  Today's Date: 04/04/2018 PT Individual Time: 1407-1500 PT Individual Time Calculation (min): 53 min   Short Term Goals: Week 1:  PT Short Term Goal 1 (Week 1): Pt will perform bed mobility with S from flat bed PT Short Term Goal 2 (Week 1): Pt will perform transfers with consistent S PT Short Term Goal 3 (Week 1): Pt will ambulate x150' with LRAD and S PT Short Term Goal 4 (Week 1): Pt will ascend/descend 3 steps no rails with minA  Skilled Therapeutic Interventions/Progress Updates:  Pt received in w/c & agreeable to tx, denying c/o pain. Pt donned tennis shoes with set up assist and extra time. Transported pt to gym via w/c total assist for time management. Pt negotiates 12 steps with L rail, laterally, with occasional cuing to leave enough room for B feet on each step & min assist overall. Pt then negotiated 6" curb multiple times with RW & min assist with ongoing cuing for compensatory technique and safety with RW management. Pt performed 3" standing step taps without UE support but min/mod assist for balance 2/2 frequent LOB and requiring cuing for safety and to reduce speed to focus on balance while in single leg stance position. Pt transferred w/c<>mat table with steady assist without AD and sit<>supine with supervision and extra time. Pt performed BLE bridging with adductor hold then LLE single leg bridging with cuing for technique and task focusing on LLE strengthening & neuromuscular re-education & control. At end of session pt left sitting in w/c in room with alarm donned & all needs in reach.  Therapy Documentation Precautions:  Precautions Precautions: Fall Precaution Comments: mild L inattention Restrictions Weight Bearing Restrictions: No   See Function Navigator for Current Functional Status.   Therapy/Group: Individual Therapy  Waunita Schooner 04/04/2018,  3:36 PM

## 2018-04-04 NOTE — Progress Notes (Addendum)
Speech Language Pathology Daily Session Note  Patient Details  Name: [name] MRN: [id number] Date of Birth: 1941-05-07  Today's Date: 04/04/2018 SLP Individual Time: 1100-1219 SLP Individual Time Calculation (min): 79 min  Short Term Goals: Week 1: SLP Short Term Goal 1 (Week 1): Pt will utilize speech intelligibility strategies with Mod I to produce > 95% intelligible speech at the complex conversation level.  SLP Short Term Goal 1 - Progress (Week 1): Progressing toward goal SLP Short Term Goal 2 (Week 1): Pt will demonstrate semi-problem solving during functional taskswith supervision cues.  SLP Short Term Goal 2 - Progress (Week 1): Revised due to lack of progress(baseline level required intermittent supervision and assistance with complex problem solving) SLP Short Term Goal 3 (Week 1): Pt will demonstrate selective attention in moderately distracting environment for ~ 45 minutes with supervision cues.  SLP Short Term Goal 4 (Week 1): Pt will demonstrate self-monitoring and correcting of functional errors during problem solving tasks with supervision A verbal and question cues. SLP Short Term Goal 4 - Progress (Week 1): Other (comment)(goal added to reflect error awareness in problem solving) SLP Short Term Goal 5 (Week 1): Pt will demonstrate anticipatory awareness by listing safe vs. unsafe tasks with supervision cues.   Skilled Therapeutic Interventions:Skilled ST services focused on cognitive skills and family education. Pt's daughter in-law was present for therapy session. SLP facilitated semi-complex problem solving and error awareness skills utilizing novel card game, pt required min-supervision A verbal cues. SLP facilitated semi-complex problem solving and error awareness with familiar task, medication management, pt required supervision A question cues. SLP facilitated anticipatory awareness given leading questions, pt listed safe and unsafe activities she could particpate in  at home with min-supervision A verbal cues. Pt demonstrated increase selective attention in 45 min intervals requiring min-supervision A verbal cues. Pt was left in room with call bell within reach and chair alaram set. SLP communicated with daughter-in-law at length about cognitive changes and recommendation for intermittent supervision at best, but possibility full supervision A. Daughter in-law agreed some supervision level, however appeared overwhelmed by recommendation. ST reccomends to continue skilled ST services.     Function:  Eating Eating                 Cognition Comprehension Comprehension assist level: Understands basic 90% of the time/cues < 10% of the time  Expression   Expression assist level: Expresses basic 90% of the time/requires cueing < 10% of the time.  Social Interaction Social Interaction assist level: Interacts appropriately 90% of the time - Needs monitoring or encouragement for participation or interaction.  Problem Solving Problem solving assist level: Solves basic 90% of the time/requires cueing < 10% of the time  Memory Memory assist level: Recognizes or recalls 90% of the time/requires cueing < 10% of the time    Pain Pain Assessment Pain Score: 0-No pain  Therapy/Group: Individual Therapy  [name]  [facility] 04/04/2018, 4:07 PM

## 2018-04-05 ENCOUNTER — Inpatient Hospital Stay (HOSPITAL_COMMUNITY): Payer: Medicare Other | Admitting: Physical Therapy

## 2018-04-05 ENCOUNTER — Inpatient Hospital Stay (HOSPITAL_COMMUNITY): Payer: Medicare Other | Admitting: Occupational Therapy

## 2018-04-05 ENCOUNTER — Inpatient Hospital Stay (HOSPITAL_COMMUNITY): Payer: Medicare Other

## 2018-04-05 LAB — GLUCOSE, CAPILLARY
Glucose-Capillary: 195 mg/dL — ABNORMAL HIGH (ref 70–99)
Glucose-Capillary: 216 mg/dL — ABNORMAL HIGH (ref 70–99)
Glucose-Capillary: 161 mg/dL — ABNORMAL HIGH (ref 70–99)
Glucose-Capillary: 194 mg/dL — ABNORMAL HIGH (ref 70–99)

## 2018-04-05 MED ORDER — SENNOSIDES-DOCUSATE SODIUM 8.6-50 MG PO TABS
2.0000 | ORAL_TABLET | Freq: Two times a day (BID) | ORAL | Status: DC
  Administered 2018-04-05 – 2018-04-12 (×13): 2 via ORAL
  Filled 2018-04-05 (×15): qty 2

## 2018-04-05 NOTE — Progress Notes (Signed)
Physical Therapy Session Note  Patient Details  Name: [name] MRN: [id number] Date of Birth: 10-Aug-1940  Today's Date: 04/05/2018 PT Individual Time: 5670-1410 PT Individual Time Calculation (min): 85 min   Short Term Goals: Week 2:  PT Short Term Goal 1 (Week 2): STG = LTG due to estimated d/c date.  Skilled Therapeutic Interventions/Progress Updates:  Pt received sitting in w/c & agreeable to tx, denying c/o pain. Therapist assisted with donning ted hose & pt donned shoes with set up assist. Educated pt on recommendation of supervision at d/c with pt very open and receptive to the recommendation. Pt ambulates throughout unit with RW & close supervision with min cuing for increased L ankle dorsiflexion during swing phase and heel strike with good return demo. Pt completed car transfer at [place] simulated height (recommend using either of these cars instead of elevated jeep) with cuing to sit on seat then transfer BLE in/out of car with pt return demonstrating with supervision and utilizing UE to assist LLE out of car when car set at [employer] simulated height. Gait trial with SPC with min assist and cuing for gait sequencing with pt demonstrating difficulty with proper gait pattern and impaired balance. Pt reports she prefers RW & therapist agrees that pt is more safe with RW vs SPC. Pt transferred onto mat and attempted to transfer into quadruped but unable even with max assist, reporting discomfort with prone postioning. Pt engaged in standing on foam with normal and narrow BOS while engaging in connect four game with close supervision for standing balance. Transitioned to standing on rocker board with lateral and A/P challenges to balance with eyes open>closed with min assist for balance and cuing for weight shifting to correct LOB and cuing for righting reactions. Pt performed L lateral step ups on 3" step with LUE support only and min assist for balance with task focusing on dynamic balance,  LLE strengthening & L NMR. Pt requires cuing for increased L hip flexion to clear foot when descending step but does demonstrate improved ability to ascend step compared to previous dates. Progressed to performing task on 6" step with BUE support for balance. Pt ambulates back to room with RW & supervision. Pt left in w/c in room with alarm donned & all needs in reach.   Therapy Documentation Precautions:  Precautions Precautions: Fall Precaution Comments: mild L inattention Restrictions Weight Bearing Restrictions: No   See Function Navigator for Current Functional Status.   Therapy/Group: Individual Therapy  Waunita Schooner 04/05/2018, 1:10 PM

## 2018-04-05 NOTE — Progress Notes (Signed)
Physical Therapy Weekly Progress Note  Patient Details  Name: [name] MRN: [id number] Date of Birth: [birth year]  Beginning of progress report period: March 29, 2018 End of progress report period: April 05, 2018  Today's Date: 04/05/2018   Patient has met 2 of 4 short term goals.  Pt currently requires steady assist for gait with RW & cuing for increased heel strike LLE, and demonstrates slight L inattention during functional tasks. Pt demonstrates impaired cognition (memory, safety awareness, problem solving) and this therapist is recommending 24 hr supervision at d/c. Pt would benefit from continued skilled PT treatment to focus balance, L NMR, L attention, strength & endurance training, as well as gait training with LRAD.  Patient continues to demonstrate the following deficits muscle weakness, decreased cardiorespiratoy endurance, decreased coordination, decreased visual perceptual skills, decreased attention to left, decreased attention, decreased awareness, decreased problem solving, decreased safety awareness, decreased memory and delayed processing, and decreased standing balance, decreased postural control, hemiplegia and decreased balance strategies and therefore will continue to benefit from skilled PT intervention to increase functional independence with mobility.  Patient progressing toward long term goals..  Continue plan of care. Goals were downgraded to supervision overall this past week 2/2 pt's impaired cognition, memory, balance, and safety awareness.  PT Short Term Goals Week 1:  PT Short Term Goal 1 (Week 1): Pt will perform bed mobility with S from flat bed PT Short Term Goal 1 - Progress (Week 1): Met PT Short Term Goal 2 (Week 1): Pt will perform transfers with consistent S PT Short Term Goal 2 - Progress (Week 1): Progressing toward goal PT Short Term Goal 3 (Week 1): Pt will ambulate x150' with LRAD and S PT Short Term Goal 3 - Progress (Week 1): Progressing  toward goal PT Short Term Goal 4 (Week 1): Pt will ascend/descend 3 steps no rails with minA PT Short Term Goal 4 - Progress (Week 1): Met(with RW) Week 2:  PT Short Term Goal 1 (Week 2): STG = LTG due to estimated d/c date.   Therapy Documentation Precautions:  Precautions Precautions: Fall Precaution Comments: mild L inattention Restrictions Weight Bearing Restrictions: No   See Function Navigator for Current Functional Status.  Therapy/Group: Individual Therapy  Waunita Schooner 04/05/2018, 7:45 AM

## 2018-04-05 NOTE — Progress Notes (Signed)
Subjective/Complaints:  No issues overnite, discussed bowel and bladder, now cont and regular  PMH of colon ca 10 yrs ago  ROS  Denies CP, SOB, N/V/D  Objective: Vital Signs: Blood pressure 137/77, pulse 92, temperature 98.1 F (36.7 C), resp. rate 18, height 5' 7"  (1.702 m), weight 85 kg, SpO2 97 %. No results found. Results for orders placed or performed during the hospital encounter of 03/28/18 (from the past 72 hour(s))  Glucose, capillary     Status: Abnormal   Collection Time: 04/02/18 10:49 AM  Result Value Ref Range   Glucose-Capillary 229 (H) 70 - 99 mg/dL  Glucose, capillary     Status: Abnormal   Collection Time: 04/02/18  4:53 PM  Result Value Ref Range   Glucose-Capillary 212 (H) 70 - 99 mg/dL  Glucose, capillary     Status: Abnormal   Collection Time: 04/02/18  9:38 PM  Result Value Ref Range   Glucose-Capillary 250 (H) 70 - 99 mg/dL  Glucose, capillary     Status: Abnormal   Collection Time: 04/03/18  6:27 AM  Result Value Ref Range   Glucose-Capillary 205 (H) 70 - 99 mg/dL  Glucose, capillary     Status: Abnormal   Collection Time: 04/03/18 11:59 AM  Result Value Ref Range   Glucose-Capillary 192 (H) 70 - 99 mg/dL  Glucose, capillary     Status: Abnormal   Collection Time: 04/03/18  4:50 PM  Result Value Ref Range   Glucose-Capillary 177 (H) 70 - 99 mg/dL  Glucose, capillary     Status: Abnormal   Collection Time: 04/03/18  9:31 PM  Result Value Ref Range   Glucose-Capillary 203 (H) 70 - 99 mg/dL  Glucose, capillary     Status: Abnormal   Collection Time: 04/04/18  7:07 AM  Result Value Ref Range   Glucose-Capillary 155 (H) 70 - 99 mg/dL  Glucose, capillary     Status: Abnormal   Collection Time: 04/04/18 11:56 AM  Result Value Ref Range   Glucose-Capillary 205 (H) 70 - 99 mg/dL  Glucose, capillary     Status: Abnormal   Collection Time: 04/04/18  4:38 PM  Result Value Ref Range   Glucose-Capillary 172 (H) 70 - 99 mg/dL  Glucose, capillary      Status: Abnormal   Collection Time: 04/04/18 10:14 PM  Result Value Ref Range   Glucose-Capillary 176 (H) 70 - 99 mg/dL  Glucose, capillary     Status: Abnormal   Collection Time: 04/05/18  7:23 AM  Result Value Ref Range   Glucose-Capillary 195 (H) 70 - 99 mg/dL     HEENT: normal Cardio: RRR and no murmur Resp: CTA B/L and unlabored GI: BS positive and NT, ND Extremity:  No Edema Skin:   Intact and Other IV site CDI Neuro: Alert/Oriented, Normal Sensory, Abnormal Motor 4= Left delt bi tri grip HF, KE 4- L ankle DF, Abnormal FMC Ataxic/ dec FMC and Dysarthric Musc/Skel:  Other RIght sided neck pain with ROM Gen NAD   Assessment/Plan: 1. 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 Physiatrist is providing close team supervision and 24 hour management of active medical problems listed below. Physiatrist and rehab team continue to assess barriers to discharge/monitor patient progress toward functional and medical goals. FIM: Function - Bathing Position: Shower Body parts bathed by patient: Right arm, Left arm, Chest, Abdomen, Front perineal area, Right upper  leg, Left upper leg, Buttocks Body parts bathed by helper: Right lower leg, Left lower leg, Back Assist Level: Touching or steadying assistance(Pt > 75%)(min assist)  Function- Upper Body Dressing/Undressing What is the patient wearing?: Pull over shirt/dress Pull over shirt/dress - Perfomed by patient: Thread/unthread right sleeve, Thread/unthread left sleeve, Put head through opening, Pull shirt over trunk Pull over shirt/dress - Perfomed by helper: Pull shirt over trunk Assist Level: Set up Set up : To obtain clothing/put away Function - Lower Body Dressing/Undressing What is the patient wearing?: Pants, Underwear, Ted Hose, Non-skid slipper socks Position: Sitting  EOB Underwear - Performed by patient: Thread/unthread right underwear leg, Thread/unthread left underwear leg, Pull underwear up/down Pants- Performed by patient: Thread/unthread right pants leg, Thread/unthread left pants leg, Pull pants up/down Pants- Performed by helper: Pull pants up/down Non-skid slipper socks- Performed by patient: Don/doff right sock, Don/doff left sock Non-skid slipper socks- Performed by helper: Don/doff right sock, Don/doff left sock Shoes - Performed by patient: Don/doff right shoe, Don/doff left shoe, Fasten right, Fasten left TED Hose - Performed by helper: Don/doff right TED hose, Don/doff left TED hose Assist for footwear: Partial/moderate assist Assist for lower body dressing: Touching or steadying assistance (Pt > 75%)(min assist)  Function - Toileting Toileting steps completed by patient: Adjust clothing prior to toileting, Performs perineal hygiene, Adjust clothing after toileting Toileting steps completed by helper: Adjust clothing prior to toileting, Performs perineal hygiene, Adjust clothing after toileting Toileting Assistive Devices: Grab bar or rail Assist level: Touching or steadying assistance (Pt.75%)  Function - Air cabin crew transfer assistive device: Grab bar, Walker Assist level to toilet: Touching or steadying assistance (Pt > 75%) Assist level from toilet: Touching or steadying assistance (Pt > 75%) Assist level to bedside commode (at bedside): Touching or steadying assistance (Pt > 75%) Assist level from bedside commode (at bedside): Moderate assist (Pt 50 - 74%/lift or lower)(per Elby Beck, NT)  Function - Chair/bed transfer Chair/bed transfer method: Ambulatory Chair/bed transfer assist level: Touching or steadying assistance (Pt > 75%) Chair/bed transfer assistive device: Armrests Chair/bed transfer details: Verbal cues for sequencing, Verbal cues for technique  Function - Locomotion: Wheelchair Will patient use  wheelchair at discharge?: No Type: Manual Max wheelchair distance: 75 Assist Level: Supervision or verbal cues Assist Level: Supervision or verbal cues Wheel 150 feet activity did not occur: Safety/medical concerns(fatigue) Turns around,maneuvers to table,bed, and toilet,negotiates 3% grade,maneuvers on rugs and over doorsills: No Function - Locomotion: Ambulation Assistive device: Walker-rolling Max distance: 30 ft  Assist level: Touching or steadying assistance (Pt > 75%) Assist level: Touching or steadying assistance (Pt > 75%) Assist level: Touching or steadying assistance (Pt > 75%) Walk 150 feet activity did not occur: Safety/medical concerns(fatigue) Assist level: Touching or steadying assistance (Pt > 75%) Walk 10 feet on uneven surfaces activity did not occur: Safety/medical concerns Assist level: Touching or steadying assistance (Pt > 75%)  Function - Comprehension Comprehension: Auditory Comprehension assist level: Understands basic 90% of the time/cues < 10% of the time  Function - Expression Expression: Verbal Expression assist level: Expresses basic 90% of the time/requires cueing < 10% of the time.  Function - Social Interaction Social Interaction assist level: Interacts appropriately 90% of the time - Needs monitoring or encouragement for participation or interaction.  Function - Problem Solving Problem solving assist level: Solves basic 90% of the time/requires cueing < 10% of the time  Function - Memory Memory assist level: Recognizes or recalls 90% of the time/requires cueing <  10% of the time Patient normally able to recall (first 3 days only): Current season, Location of own room, Staff names and faces, That he or she is in a hospital  Medical Problem List and Plan: 1.  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 Formulized individual treatment plan based on medical history, underlying problem and comorbidities. 2.  DVT Prophylaxis/Anticoagulation: Subcutaneous heparin 500U TID, no Lovenox due to Cr Cl 3. Pain Management: Tylenol as needed  4. Mood: Remeron 15 mg nightly, Effexor or 75 mg every other day, Ativan 0.4 mg 4 times daily, Abilify 5 mg daily Dr Sima Matas to follow- some lability reported when challanged per SLP- focus on functional activities 5. Neuropsych: This patient is capable of making decisions on her own behalf. 6. Skin/Wound Care: Routine skin checks 7. Fluids/Electrolytes/Nutrition: Routine in and outs with follow-up chemistries 8.  Diabetes mellitus.  Hemoglobin A1c 8.6.  home dose NovoLog 70/30 10 units twice daily.  Check blood sugars before meals and at bedtime CBG (last 3)  Recent Labs    04/04/18 1638 04/04/18 2214 04/05/18 0723  GLUCAP 172* 176* 195*  CBG controlled 9/11- in hospital range of <180,for most readings will cont 12 U 9.  History of tachycardia with ablation 2007.  No chest pain or shortness of breath. 10.  CKD stage III.  Baseline creatinine 1.95.  Follow-up chemistries Creat is baseline BUN mildly elevated, Cr Cl <30 11.  History of breast cancer with lumpectomy.  Follow-up outpatient 12.  Hyperlipidemia.  Lipitor- 45m   LOS (Days) 8 A FACE TO FACE EVALUATION WAS PERFORMED  ACharlett Blake9/05/2018, 8:51 AM

## 2018-04-05 NOTE — Progress Notes (Signed)
Speech Language Pathology Weekly Progress and Session Note  Patient Details  Name: [name] MRN: [id number] Date of Birth: 08/30/1940  Beginning of progress report period: March 29, 2018 End of progress report period: April 05, 2018  Today's Date: 04/05/2018 SLP Individual Time: 1300-1400 SLP Individual Time Calculation (min): 60 min  Short Term Goals: Week 1: SLP Short Term Goal 1 (Week 1): Pt will utilize speech intelligibility strategies with Mod I to produce > 95% intelligible speech at the complex conversation level.  SLP Short Term Goal 1 - Progress (Week 1): Met SLP Short Term Goal 2 (Week 1): Pt will demonstrate semi-problem solving during functional tasks with supervision cues.  SLP Short Term Goal 2 - Progress (Week 1): Progressing toward goal(continue due to recent update) SLP Short Term Goal 3 (Week 1): Pt will demonstrate selective attention in moderately distracting environment for ~ 45 minutes with supervision cues.  SLP Short Term Goal 3 - Progress (Week 1): Not met SLP Short Term Goal 4 (Week 1): Pt will demonstrate self-monitoring and correcting of functional errors during problem solving tasks with supervision A verbal and question cues. SLP Short Term Goal 4 - Progress (Week 1): Progressing toward goal SLP Short Term Goal 5 (Week 1): Pt will demonstrate anticipatory awareness by listing safe vs. unsafe tasks with supervision cues.  SLP Short Term Goal 5 - Progress (Week 1): Not met    New Short Term Goals: Week 2: SLP Short Term Goal 1 (Week 2): Pt will demonstrate semi-problem solving during functional tasks with supervision cues.  SLP Short Term Goal 2 (Week 2): Pt will demonstrate self-monitoring and correcting of functional errors during problem solving tasks with supervision A verbal and question cues. SLP Short Term Goal 3 (Week 2): Pt will demonstrate selective attention in moderately distracting environment for ~ 45 minutes with supervision cues.   SLP Short Term Goal 4 (Week 2): Pt will demonstrate anticipatory awareness by listing safe vs. unsafe tasks with supervision cues.   Weekly Progress Updates: Pt made poor progress meeting 1 out 5 goals, largely due to emotional distress in the beginning of the reporting period, however improved in later half. Pt's LTG/STG have been adjusted to reflect baseline abilities supported by further information from son/daughter-in-law, providing intermittent supervision, however likely requiring more towards full supervision A for safety. Pt's family reported increase need for supervision piror to admission. Pt demonstrated Mod Iuse of speech intelligibility strategies in conversation with 95% intelligibility, however son's believes impairment continues, SLP provided education of strategies. SLP treatment sessions should focus on semi-complex problem solving, error awareness, selective attention and anticipatory awareness at supervision A level in preparing for upcoming discharge. Family education has begun and are awareness of supervision recommendation. Pt would continue to benefit from skilled ST services in order to maximize functional independence and reduce burden of care requiring intermittent supervision.     Intensity: Minumum of 1-2 x/day, 30 to 90 minutes Frequency: 3 to 5 out of 7 days Duration/Length of Stay: 9/18 Treatment/Interventions: Cognitive remediation/compensation;Cueing hierarchy;Medication managment;Patient/family education   Daily Session  Skilled Therapeutic Interventions: Skilled ST services focused on cognitive skills and family education. SLP facilitated safety awareness given picture cards, pt identified safe verse unsafe situation with supervision a question cues. SLP facilitated semi-complex problem solving with novel card game, pt required initial min A verbal cues fading to supervision A verbal/question cues. SLP facilitated recall of novel information ( noted impairments in  other therapy disciplines) required supervision a verbal cues to recall  4/5 items with 30 minute delay and min-supervision a verbal cues for selective attention in distracting environment. Pt was left in room with call bell within reach and bed alaram set.ST reccomends to continue skilled ST services.        Function:   Eating Eating                 Cognition Comprehension Comprehension assist level: Understands complex 90% of the time/cues 10% of the time  Expression   Expression assist level: Expresses complex 90% of the time/cues < 10% of the time  Social Interaction Social Interaction assist level: Interacts appropriately with others with medication or extra time (anti-anxiety, antidepressant).  Problem Solving Problem solving assist level: Solves complex 90% of the time/cues < 10% of the time;Solves basic problems with no assist  Memory Memory assist level: Recognizes or recalls 90% of the time/requires cueing < 10% of the time   General    Pain Pain Assessment Pain Score: 0-No pain  Therapy/Group: Individual Therapy  Anevay Campanella  [facility] 04/05/2018, 4:06 PM

## 2018-04-05 NOTE — Progress Notes (Addendum)
Occupational Therapy Weekly Progress Note  Patient Details  Name: [name] MRN: [id number] Date of Birth: 1941-07-04  Beginning of progress report period: March 29, 2018 End of progress report period: April 05, 2018  Today's Date: 04/05/2018 OT Individual Time: 5188-4166 OT Individual Time Calculation (min): 56 min    Patient has met 3 of 3 short term goals.  Pt is making steady progress towards goals. Pt currently requires min guard during mobility and self-care tasks secondary to Lt inattention and decreased awareness and problem solving.  Pt is demonstrating improved strength and functional use of LUE.  Pt continues to demonstrate impaired cognition (memory, safety awareness, problem solving) impacting her ability to truly meet Mod I level goals.  This therapist would recommend supervision at d/c for higher level safety concerns required to remain living independently.   Patient continues to demonstrate the following deficitsmuscle weakness, decreased cardiorespiratoy endurance, impaired timing and sequencing and decreased coordination, decreased visual perceptual skills, decreased attention to left, decreased awareness, decreased problem solving, decreased safety awareness and decreased memory and decreased standing balance and hemiplegia and therefore will continue to benefit from skilled OT intervention to enhance overall performance with BADL and Reduce care partner burden.  Patient progressing toward long term goals..  Plan of care revisions: downgraded bathing, shower transfers, and meal prep goal to supervision.  OT Short Term Goals Week 1:  OT Short Term Goal 1 (Week 1): Pt will complete bathing with min assist at sit > stand level OT Short Term Goal 1 - Progress (Week 1): Met OT Short Term Goal 2 (Week 1): Pt will complete LB dressing with min assist OT Short Term Goal 2 - Progress (Week 1): Met OT Short Term Goal 3 (Week 1): Pt will complete 2 grooming tasks in  standing with contact guard for increased activity tolerance OT Short Term Goal 3 - Progress (Week 1): Met Week 2:  OT Short Term Goal 1 (Week 2): STG = LTGs due to remaining LOS  Skilled Therapeutic Interventions/Progress Updates:    Treatment session with focus on safety awareness and problem solving with higher level ADLs.  Pt's son and SWK present during initial portion of therapy session.  Engaged in lengthy conversation regarding 24/7 supervision recommendation.  Discussed concerns cognitively with memory, awareness, and problem solving, providing son with examples from pill sorting task and safety concerns regarding cooking.  Plan to engage in meal prep task by end of the week.  Ambulated to ADL apt with pt overall supervision to min guard, pt demonstrating decreased attention to Lt during mobility, requiring min cues to attend and then to problem solve once she bumped RW in to wall.  Engaged in simulated meal prep activity, with therapist providing cues for safety with maneuvering through kitchen with RW and educating on use of counter tops to transport items instead of attempting to transport items while managing RW.  Pt ambulated back to room with min guard due to increase in Lt inattention, question if due to increased conversation, fatigue or distractibility.    Therapy Documentation Precautions:  Precautions Precautions: Fall Precaution Comments: mild L inattention Restrictions Weight Bearing Restrictions: No General:   Vital Signs: Therapy Vitals Temp: 98.6 F (37 C) Temp Source: Oral Pulse Rate: 91 Resp: 18 BP: 117/75 Patient Position (if appropriate): Sitting Oxygen Therapy SpO2: 100 % O2 Device: Room Air Pain:  Pt with no c/o pain  See Function Navigator for Current Functional Status.   Therapy/Group: Individual Therapy  Simonne Come 04/05/2018, 4:03  PM

## 2018-04-05 NOTE — Plan of Care (Signed)
  Problem: Consults Goal: RH STROKE PATIENT EDUCATION Description See Patient Education module for education specifics  Outcome: Progressing Goal: Diabetes Guidelines if Diabetic/Glucose > 140 Description If diabetic or lab glucose is > 140 mg/dl - Initiate Diabetes/Hyperglycemia Guidelines & Document Interventions  Outcome: Progressing   Problem: RH BOWEL ELIMINATION Goal: RH STG MANAGE BOWEL WITH ASSISTANCE Description STG Manage Bowel with mod I Assistance.  Outcome: Progressing   Problem: RH BLADDER ELIMINATION Goal: RH STG MANAGE BLADDER WITH ASSISTANCE Description STG Manage Bladder With mod I Assistance  Outcome: Progressing   Problem: RH SKIN INTEGRITY Goal: RH STG SKIN FREE OF INFECTION/BREAKDOWN Description Maintain skin integrity with mod I assist   Outcome: Progressing   Problem: RH KNOWLEDGE DEFICIT Goal: RH STG INCREASE KNOWLEDGE OF DIABETES Description Patient able to state 2 S/S of hypoglycemia/hyperglycemia with cues and treatment  Outcome: Progressing Goal: RH STG INCREASE KNOWLEGDE OF HYPERLIPIDEMIA Description Patient able to state 2 direct ways to manage high cholesterol with cues.    Outcome: Progressing Goal: RH STG INCREASE KNOWLEDGE OF STROKE PROPHYLAXIS Description Patient will be able to describe medication and lifestyle factors that help prevent stroke with cues  Outcome: Progressing

## 2018-04-06 ENCOUNTER — Inpatient Hospital Stay (HOSPITAL_COMMUNITY): Payer: Medicare Other | Admitting: Occupational Therapy

## 2018-04-06 ENCOUNTER — Inpatient Hospital Stay (HOSPITAL_COMMUNITY): Payer: Medicare Other

## 2018-04-06 ENCOUNTER — Inpatient Hospital Stay (HOSPITAL_COMMUNITY): Payer: Medicare Other | Admitting: Speech Pathology

## 2018-04-06 ENCOUNTER — Ambulatory Visit (HOSPITAL_COMMUNITY): Payer: Medicare Other | Admitting: Occupational Therapy

## 2018-04-06 LAB — GLUCOSE, CAPILLARY
Glucose-Capillary: 155 mg/dL — ABNORMAL HIGH (ref 70–99)
Glucose-Capillary: 176 mg/dL — ABNORMAL HIGH (ref 70–99)
Glucose-Capillary: 209 mg/dL — ABNORMAL HIGH (ref 70–99)
Glucose-Capillary: 170 mg/dL — ABNORMAL HIGH (ref 70–99)

## 2018-04-06 NOTE — Progress Notes (Signed)
Speech Language Pathology Daily Session Note  Patient Details  Name: [name] MRN: [id number] Date of Birth: Nov 29, 1940  Today's Date: 04/06/2018 SLP Individual Time: 1300-1345 SLP Individual Time Calculation (min): 45 min  Short Term Goals: Week 2: SLP Short Term Goal 1 (Week 2): Pt will demonstrate semi-problem solving during functional tasks with supervision cues.  SLP Short Term Goal 2 (Week 2): Pt will demonstrate self-monitoring and correcting of functional errors during problem solving tasks with supervision A verbal and question cues. SLP Short Term Goal 3 (Week 2): Pt will demonstrate selective attention in moderately distracting environment for ~ 45 minutes with supervision cues.  SLP Short Term Goal 4 (Week 2): Pt will demonstrate anticipatory awareness by listing safe vs. unsafe tasks with supervision cues.   Skilled Therapeutic Interventions: Skilled treatment session focused on cognition goals. SLP facilitated session by providing supervision cues to recall information related to location of gift shop and [employer]. Pt able to demonstrate selective attention in moderately distracting environment in atrium to purchase coffee and chocolate. With supervision, pt able to estimate cost of purchase, recall recommendation of 24 hour supervision (made by Judson Roch and [place]), recall information regarding AL by Sonia Baller.Pt stated that she was fearful to go home by herself d/t general anxiety and recommendation that she will likely require more help than she realizes in functional home setting (such as medication management, fixing meals - list generated by pt). Pt was returned to room, left upright in wheelchair with all needs within reach. Continue per current plan of care.      Function:    Cognition Comprehension Comprehension assist level: Understands complex 90% of the time/cues 10% of the time  Expression   Expression assist level: Expresses complex 90% of the time/cues < 10% of  the time  Social Interaction Social Interaction assist level: Interacts appropriately with others with medication or extra time (anti-anxiety, antidepressant).;Interacts appropriately 90% of the time - Needs monitoring or encouragement for participation or interaction.  Problem Solving Problem solving assist level: Solves basic problems with no assist  Memory Memory assist level: Recognizes or recalls 75 - 89% of the time/requires cueing 10 - 24% of the time    Pain    Therapy/Group: Individual Therapy  Josslynn Mentzer 04/06/2018, 2:24 PM

## 2018-04-06 NOTE — Progress Notes (Signed)
Physical Therapy Session Note  Patient Details  Name: [name] MRN: [id number] Date of Birth: [birth year]  Today's Date: 04/06/2018 PT Individual Time: 9563-8756 PT Individual Time Calculation (min): 60 min   Short Term Goals:  Week 2:  PT Short Term Goal 1 (Week 2): STG = LTG due to estimated d/c date.  Skilled Therapeutic Interventions/Progress Updates:   neuromuscular re-education via forced use, multimodal cues for alternating reciprocal movement x 4 extremities on NuStep at level 4, x 7 minutes, focusing on neutral L hip rotation control and upper trunk rotation.    Given external perturbations, pt demonstrated bil ankle strategy, absent bil hip strategy or stepping strategy.  Neuro re-ed via resisted bil trunk flexion in standing to facilitate hip balance strategy.  Stand pivot transfers without AD with close supervision> min guard assist.  Sit>< stand blocked practice without use of UEs to facilitate head/hips relationship and = wt bearing bil LEs; pt's R toes noted to extend with this.   Sustained stretch bil heel cords and balance challenge, standing on wedge x 2 minutes, with bil UE support fading to 0UE support. PT educated pt on self stretching bil hamstrings in sitting, using foot stool, x 30 seconds x 1,  R/L.  Gait training with RW on level tile with multiple turns with close supervision/min guard assist.  Pt's son observed pt during sit>< stand and gait.  He had questions about pt's balance and how it affects her safety at d/c. PT discussed pt's tendency to be distracted by her environment.    Pt left resting in w/c with needs at hand.     Therapy Documentation Precautions:  Precautions Precautions: Fall Precaution Comments: mild L inattention Restrictions Weight Bearing Restrictions: No  Pain: pt denies       See Function Navigator for Current Functional Status.   Therapy/Group: Individual Therapy  Cameren Earnest 04/06/2018, 4:39 PM

## 2018-04-06 NOTE — Progress Notes (Signed)
Subjective/Complaints:  Discussed need for 24/7 sup with pt and son  PMH of colon ca 10 yrs ago  ROS  Denies CP, SOB, N/V/D  Objective: Vital Signs: Blood pressure 134/79, pulse 92, temperature 98.1 F (36.7 C), temperature source Oral, resp. rate 18, height 5\' 7"  (1.702 m), weight 85 kg, SpO2 98 %. No results found. Results for orders placed or performed during the hospital encounter of 03/28/18 (from the past 72 hour(s))  Glucose, capillary     Status: Abnormal   Collection Time: 04/03/18 11:59 AM  Result Value Ref Range   Glucose-Capillary 192 (H) 70 - 99 mg/dL  Glucose, capillary     Status: Abnormal   Collection Time: 04/03/18  4:50 PM  Result Value Ref Range   Glucose-Capillary 177 (H) 70 - 99 mg/dL  Glucose, capillary     Status: Abnormal   Collection Time: 04/03/18  9:31 PM  Result Value Ref Range   Glucose-Capillary 203 (H) 70 - 99 mg/dL  Glucose, capillary     Status: Abnormal   Collection Time: 04/04/18  7:07 AM  Result Value Ref Range   Glucose-Capillary 155 (H) 70 - 99 mg/dL  Glucose, capillary     Status: Abnormal   Collection Time: 04/04/18 11:56 AM  Result Value Ref Range   Glucose-Capillary 205 (H) 70 - 99 mg/dL  Glucose, capillary     Status: Abnormal   Collection Time: 04/04/18  4:38 PM  Result Value Ref Range   Glucose-Capillary 172 (H) 70 - 99 mg/dL  Glucose, capillary     Status: Abnormal   Collection Time: 04/04/18 10:14 PM  Result Value Ref Range   Glucose-Capillary 176 (H) 70 - 99 mg/dL  Glucose, capillary     Status: Abnormal   Collection Time: 04/05/18  7:23 AM  Result Value Ref Range   Glucose-Capillary 195 (H) 70 - 99 mg/dL  Glucose, capillary     Status: Abnormal   Collection Time: 04/05/18 11:16 AM  Result Value Ref Range   Glucose-Capillary 216 (H) 70 - 99 mg/dL  Glucose, capillary     Status: Abnormal   Collection Time: 04/05/18  4:36 PM  Result Value Ref Range   Glucose-Capillary 161 (H) 70 - 99 mg/dL  Glucose, capillary      Status: Abnormal   Collection Time: 04/05/18  9:41 PM  Result Value Ref Range   Glucose-Capillary 194 (H) 70 - 99 mg/dL  Glucose, capillary     Status: Abnormal   Collection Time: 04/06/18  7:11 AM  Result Value Ref Range   Glucose-Capillary 155 (H) 70 - 99 mg/dL     HEENT: normal Cardio: RRR and no murmur Resp: CTA B/L and unlabored GI: BS positive and NT, ND Extremity:  No Edema Skin:   Intact and Other IV site CDI Neuro: Alert/Oriented, Normal Sensory, Abnormal Motor 4= Left delt bi tri grip HF, KE 4- L ankle DF, Abnormal FMC Ataxic/ dec FMC and Dysarthric Musc/Skel:  Other RIght sided neck pain with ROM Gen NAD   Assessment/Plan: 1. 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 Physiatrist is providing close team supervision and 24 hour management of active medical problems listed below. Physiatrist and rehab team continue to assess barriers to discharge/monitor patient progress toward functional and medical goals. FIM: Function - Bathing Position: Shower Body parts bathed by patient: Right arm, Left arm, Chest, Abdomen, Front perineal area, Right  upper leg, Left upper leg, Buttocks Body parts bathed by helper: Right lower leg, Left lower leg, Back Assist Level: Touching or steadying assistance(Pt > 75%)(min assist)  Function- Upper Body Dressing/Undressing What is the patient wearing?: Pull over shirt/dress Pull over shirt/dress - Perfomed by patient: Thread/unthread right sleeve, Thread/unthread left sleeve, Put head through opening, Pull shirt over trunk Pull over shirt/dress - Perfomed by helper: Pull shirt over trunk Assist Level: Set up Set up : To obtain clothing/put away Function - Lower Body Dressing/Undressing What is the patient wearing?: Pants, Underwear, Ted Hose, Non-skid slipper socks Position: Sitting  EOB Underwear - Performed by patient: Thread/unthread right underwear leg, Thread/unthread left underwear leg, Pull underwear up/down Pants- Performed by patient: Thread/unthread right pants leg, Thread/unthread left pants leg, Pull pants up/down Pants- Performed by helper: Pull pants up/down Non-skid slipper socks- Performed by patient: Don/doff right sock, Don/doff left sock Non-skid slipper socks- Performed by helper: Don/doff right sock, Don/doff left sock Shoes - Performed by patient: Don/doff right shoe, Don/doff left shoe, Fasten right, Fasten left TED Hose - Performed by helper: Don/doff right TED hose, Don/doff left TED hose Assist for footwear: Partial/moderate assist Assist for lower body dressing: Touching or steadying assistance (Pt > 75%)(min assist)  Function - Toileting Toileting steps completed by patient: Adjust clothing prior to toileting, Performs perineal hygiene, Adjust clothing after toileting Toileting steps completed by helper: Adjust clothing prior to toileting, Performs perineal hygiene, Adjust clothing after toileting Toileting Assistive Devices: Grab bar or rail Assist level: Touching or steadying assistance (Pt.75%)  Function - Air cabin crew transfer assistive device: Grab bar, Walker Assist level to toilet: Touching or steadying assistance (Pt > 75%) Assist level from toilet: Touching or steadying assistance (Pt > 75%) Assist level to bedside commode (at bedside): Touching or steadying assistance (Pt > 75%) Assist level from bedside commode (at bedside): Moderate assist (Pt 50 - 74%/lift or lower)(per Elby Beck, NT)  Function - Chair/bed transfer Chair/bed transfer method: Ambulatory Chair/bed transfer assist level: Touching or steadying assistance (Pt > 75%) Chair/bed transfer assistive device: Armrests Chair/bed transfer details: Verbal cues for sequencing, Verbal cues for technique  Function - Locomotion: Wheelchair Will patient use  wheelchair at discharge?: No Type: Manual Max wheelchair distance: 75 Assist Level: Supervision or verbal cues Assist Level: Supervision or verbal cues Wheel 150 feet activity did not occur: Safety/medical concerns(fatigue) Turns around,maneuvers to table,bed, and toilet,negotiates 3% grade,maneuvers on rugs and over doorsills: No Function - Locomotion: Ambulation Assistive device: Walker-rolling Max distance: 125 ft Assist level: Supervision or verbal cues Assist level: Supervision or verbal cues Assist level: Supervision or verbal cues Walk 150 feet activity did not occur: Safety/medical concerns(fatigue) Assist level: Touching or steadying assistance (Pt > 75%) Walk 10 feet on uneven surfaces activity did not occur: Safety/medical concerns Assist level: Touching or steadying assistance (Pt > 75%)  Function - Comprehension Comprehension: Auditory Comprehension assist level: Understands complex 90% of the time/cues 10% of the time  Function - Expression Expression: Verbal Expression assist level: Expresses complex 90% of the time/cues < 10% of the time  Function - Social Interaction Social Interaction assist level: Interacts appropriately with others with medication or extra time (anti-anxiety, antidepressant).  Function - Problem Solving Problem solving assist level: Solves complex 90% of the time/cues < 10% of the time, Solves basic problems with no assist  Function - Memory Memory assist level: Recognizes or recalls 90% of the time/requires cueing < 10% of the time Patient normally able to recall (first  3 days only): Current season, Location of own room, Staff names and faces, That he or she is in a hospital  Medical Problem List and Plan: 1.  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  DVT  Prophylaxis/Anticoagulation: Subcutaneous heparin 500U TID, no Lovenox due to Cr Cl 3. Pain Management: Tylenol as needed  4. Mood: Remeron 15 mg nightly, Effexor or 75 mg every other day, Ativan 0.4 mg 4 times daily, Abilify 5 mg daily Dr Sima Matas to follow- some lability reported when challanged per SLP- focus on functional activities 5. Neuropsych: This patient is capable of making decisions on her own behalf. 6. Skin/Wound Care: Routine skin checks 7. Fluids/Electrolytes/Nutrition: Routine in and outs with follow-up chemistries 8.  Diabetes mellitus.  Hemoglobin A1c 8.6.  home dose NovoLog 70/30 10 units twice daily.  Check blood sugars before meals and at bedtime CBG (last 3)  Recent Labs    04/05/18 1636 04/05/18 2141 04/06/18 0711  GLUCAP 161* 194* 155*  CBG controlled 9/11- in hospital range of <180,for most readings will cont 12 U 9.  History of tachycardia with ablation 2007.  No chest pain or shortness of breath. 10.  CKD stage III.  Baseline creatinine 1.95.  Follow-up chemistries Creat is baseline BUN mildly elevated, Cr Cl <30 11.  History of breast cancer with lumpectomy.  Follow-up outpatient 12.  Hyperlipidemia.  Lipitor- 80mg    LOS (Days) 9 A FACE TO FACE EVALUATION WAS PERFORMED  Charlett Blake 04/06/2018, 8:01 AM

## 2018-04-06 NOTE — Progress Notes (Signed)
Occupational Therapy Session Note  Patient Details  Name: [name] MRN: [id number] Date of Birth: 09-30-1940  Today's Date: 04/06/2018 OT Individual Time: 1000-1100 OT Individual Time Calculation (min): 60 min    Short Term Goals: Week 2:  OT Short Term Goal 1 (Week 2): STG = LTGs due to remaining LOS  Skilled Therapeutic Interventions/Progress Updates:    Treatment session with focus on d/c planning and problem solving regarding IADLs.  Pt received upright in w/c declining bathing/dressing and reporting fatigue this AM.  Pt reports concerns about d/c home and returning to prior functional tasks.  Reiterated recommendation for supervision level due to cognitive concerns with impaired memory and decreased safety awareness.  Pt's daughter in law, [name], arrived during session and engaged in discussion involving d/c planning to further assess pt needs and how to provide for the recommended supervision level.  Pt ambulated 150' x2 with min cues to attend to Lt but no physical assistance needed this session.  Ambulated in home environment in ADL apt with RW, pt required min assist sit > stand from low couch.  Discussed alternative sitting options to ensure safety and independence with sit > stand.  Pt completed tub/shower transfer with use of tub bench, therapist recommending to pt and daughter in law use of tub bench for tub/shower.    Pt continues to voice fearfulness in going home by herself.  Discussed possibility of alternative suggestions with pt and daughter in law to further investigate options with SWK and pt's son.  Therapy Documentation Precautions:  Precautions Precautions: Fall Precaution Comments: mild L inattention Restrictions Weight Bearing Restrictions: No General:   Vital Signs: Therapy Vitals Temp: 98.4 F (36.9 C) Temp Source: Oral Pulse Rate: 90 Resp: 19 BP: 126/69 Patient Position (if appropriate): Sitting Oxygen Therapy SpO2: 98 % O2 Device: Room  Air Pain:  Pt with no c/o pain  See Function Navigator for Current Functional Status.   Therapy/Group: Individual Therapy  Simonne Come 04/06/2018, 3:39 PM

## 2018-04-06 NOTE — Progress Notes (Signed)
Occupational Therapy Session Note  Patient Details  Name: [name] MRN: [id number] Date of Birth: 1940-11-11  Today's Date: 04/06/2018 OT Individual Time: 1132-1200 OT Individual Time Calculation (min): 28 min    Short Term Goals: Week 2:  OT Short Term Goal 1 (Week 2): STG = LTGs due to remaining LOS  Skilled Therapeutic Interventions/Progress Updates:    Pt presents sitting up in w/c, no c/o pain agreeable to OT tx session. Pt requesting to change water in vase of flowers in her room. Completed room level ambulation using RW with supervision, increasing to minguard when making turns in smaller spaces. Pt able to lift and give therapist vase of flowers using LUE with therapist providing light lifting assist. Ambulated to sink with pt assisting to remove and hold flowers while refilling vase with water. Returned vase to [employer] in same manner. Pt participating in additional standing theraputty activity with focus on increasing LUE FMC/strength. End of session pt practicing opening lunch/drink containers, able to perform given increased time and effort, requires assist to cut up food at this time. Pt left seated in w/c, chair alarm set, call bell and needs within reach.   Therapy Documentation Precautions:  Precautions Precautions: Fall Precaution Comments: mild L inattention Restrictions Weight Bearing Restrictions: No   See Function Navigator for Current Functional Status.   Therapy/Group: Individual Therapy  Raymondo Band 04/06/2018, 3:53 PM

## 2018-04-07 ENCOUNTER — Inpatient Hospital Stay (HOSPITAL_COMMUNITY): Payer: Medicare Other | Admitting: Occupational Therapy

## 2018-04-07 ENCOUNTER — Inpatient Hospital Stay (HOSPITAL_COMMUNITY): Payer: Medicare Other | Admitting: Speech Pathology

## 2018-04-07 ENCOUNTER — Inpatient Hospital Stay (HOSPITAL_COMMUNITY): Payer: Medicare Other | Admitting: Physical Therapy

## 2018-04-07 LAB — GLUCOSE, CAPILLARY
Glucose-Capillary: 177 mg/dL — ABNORMAL HIGH (ref 70–99)
Glucose-Capillary: 145 mg/dL — ABNORMAL HIGH (ref 70–99)
Glucose-Capillary: 171 mg/dL — ABNORMAL HIGH (ref 70–99)
Glucose-Capillary: 146 mg/dL — ABNORMAL HIGH (ref 70–99)

## 2018-04-07 MED ORDER — INSULIN ASPART PROT & ASPART (70-30 MIX) 100 UNIT/ML ~~LOC~~ SUSP
14.0000 [IU] | Freq: Two times a day (BID) | SUBCUTANEOUS | Status: DC
  Administered 2018-04-07 – 2018-04-12 (×10): 14 [IU] via SUBCUTANEOUS

## 2018-04-07 NOTE — Progress Notes (Signed)
Physical Therapy Session Note  Patient Details  Name: [name] MRN: [id number] Date of Birth: 1940-08-05  Today's Date: 04/07/2018 PT Individual Time: 4944-9675 PT Individual Time Calculation (min): 84 min   Short Term Goals: Week 2:  PT Short Term Goal 1 (Week 2): STG = LTG due to estimated d/c date.  Skilled Therapeutic Interventions/Progress Updates:  Pt received asleep in bed but easily awakened & agreeable to tx. Pt denied c/o pain. Pt transferred to EOB with supervision and donned shoes with set up assist and significantly extra time. Pt ambulates within room & bathroom with RW and performs toilet transfer & 3/3 toileting steps with supervision, only requiring cuing to put toilet seat down before sitting. Pt performed hand hygiene standing at sink with supervision. Transported pt outside [place] via w/c total assist for time management. Pt ambulated outside over uneven surfaces with slight inclines/declines with supervision and occasional cuing for L attention and to identify obstacles/tripping hazards. Pt completes sit<>stand transfer with supervision from bench outside. Back on unit pt performed Level A OTAGO exercises (long arc quads, hamstring curls, sit<>stand with BUE support, hip abduction, mini squats and tandem stance) with rail for BUE balance support & instructional cuing for technique. Pt negotiated 12 steps laterally with L rail to simulate home entry with close supervision. Pt ambulates back to room with RW & supervision and returns to supine in bed with supervision. Pt left in bed with alarm set & all needs in reach.  Therapy Documentation Precautions:  Precautions Precautions: Fall Precaution Comments: mild L inattention Restrictions Weight Bearing Restrictions: No  See Function Navigator for Current Functional Status.   Therapy/Group: Individual Therapy  Waunita Schooner 04/07/2018, 3:28 PM

## 2018-04-07 NOTE — Progress Notes (Signed)
Occupational Therapy Session Note  Patient Details  Name: [name] MRN: [id number] Date of Birth: 1941/06/19  Today's Date: 04/07/2018 OT Individual Time: 1000-1100 OT Individual Time Calculation (min): 60 min    Short Term Goals: Week 2:  OT Short Term Goal 1 (Week 2): STG = LTGs due to remaining LOS  Skilled Therapeutic Interventions/Progress Updates:    Treatment session with focus on safety awareness and problem solving with meal prep task in ADL kitchen.  Pt received supine in bed reporting not wanting to complete a shower this session.  Discussed meal prep goal with pt willing to complete meal prep task this session.  Pt ambulated to ADL apt kitchen with RW with min guard to supervision.  Pt completed transfers from recliner with min cues for anterior weight shift due to low seat height.  Pt completed meal prep (grilled cheese) with increased time and min cues for sequencing during cooking task.  Educated on planning ahead and energy conservation strategies during meal prep tasks.  Continue to recommend supervision with meal prep tasks as pt required cues for sequencing and pt nearly burned sandwich as she cooked at too high heat.  Ambulated back to room and left semi-reclined in bed with all needs in reach.  Therapy Documentation Precautions:  Precautions Precautions: Fall Precaution Comments: mild L inattention Restrictions Weight Bearing Restrictions: No Pain:   Pt with no c/o pain  See Function Navigator for Current Functional Status.   Therapy/Group: Individual Therapy  Simonne Come 04/07/2018, 12:12 PM

## 2018-04-07 NOTE — Progress Notes (Signed)
Speech Language Pathology Daily Session Note  Patient Details  Name: [name] MRN: [id number] Date of Birth: November 14, 1940  Today's Date: 04/07/2018 SLP Individual Time: 1300-1345 SLP Individual Time Calculation (min): 45 min  Short Term Goals: Week 2: SLP Short Term Goal 1 (Week 2): Pt will demonstrate semi-problem solving during functional tasks with supervision cues.  SLP Short Term Goal 2 (Week 2): Pt will demonstrate self-monitoring and correcting of functional errors during problem solving tasks with supervision A verbal and question cues. SLP Short Term Goal 3 (Week 2): Pt will demonstrate selective attention in moderately distracting environment for ~ 45 minutes with supervision cues.  SLP Short Term Goal 4 (Week 2): Pt will demonstrate anticipatory awareness by listing safe vs. unsafe tasks with supervision cues.   Skilled Therapeutic Interventions:  Skilled treatment session focused on cognition goals. SLP facilitated session by providing supervision cues to search recipes and create menu for covered dish meals. Pt with improved selective attention in moderately distracting environment for ` 45 minutes with supervision cues and able to self-monitor herself for overall off-topic comments related to her emotions. Continue to recommend 24 hour supervision at discharge.      Function:  Eating Eating   Modified Consistency Diet: No Eating Assist Level: Set up assist for   Eating Set Up Assist For: Opening containers       Cognition Comprehension Comprehension assist level: Understands complex 90% of the time/cues 10% of the time  Expression   Expression assist level: Expresses complex 90% of the time/cues < 10% of the time  Social Interaction Social Interaction assist level: Interacts appropriately 90% of the time - Needs monitoring or encouragement for participation or interaction.  Problem Solving Problem solving assist level: Solves complex 90% of the time/cues < 10% of the  time  Memory Memory assist level: Recognizes or recalls 75 - 89% of the time/requires cueing 10 - 24% of the time    Pain    Therapy/Group: Individual Therapy  Ahmad Vanwey 04/07/2018, 3:21 PM

## 2018-04-07 NOTE — Progress Notes (Signed)
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 rate 19, height 5\' 7"  (1.702 m), weight 85 kg, SpO2 98 %. No results found. Results for orders placed or performed during the hospital encounter of 03/28/18 (from the past 72 hour(s))  Glucose, capillary     Status: Abnormal   Collection Time: 04/04/18 11:56 AM  Result Value Ref Range   Glucose-Capillary 205 (H) 70 - 99 mg/dL  Glucose, capillary     Status: Abnormal   Collection Time: 04/04/18  4:38 PM  Result Value Ref Range   Glucose-Capillary 172 (H) 70 - 99 mg/dL  Glucose, capillary     Status: Abnormal   Collection Time: 04/04/18 10:14 PM  Result Value Ref Range   Glucose-Capillary 176 (H) 70 - 99 mg/dL  Glucose, capillary     Status: Abnormal   Collection Time: 04/05/18  7:23 AM  Result Value Ref Range   Glucose-Capillary 195 (H) 70 - 99 mg/dL  Glucose, capillary     Status: Abnormal   Collection Time: 04/05/18 11:16 AM  Result Value Ref Range   Glucose-Capillary 216 (H) 70 - 99 mg/dL  Glucose, capillary     Status: Abnormal   Collection Time: 04/05/18  4:36 PM  Result Value Ref Range   Glucose-Capillary 161 (H) 70 - 99 mg/dL  Glucose, capillary     Status: Abnormal   Collection Time: 04/05/18  9:41 PM  Result Value Ref Range   Glucose-Capillary 194 (H) 70 - 99 mg/dL  Glucose, capillary     Status: Abnormal   Collection Time: 04/06/18  7:11 AM  Result Value Ref Range   Glucose-Capillary 155 (H) 70 - 99 mg/dL  Glucose, capillary     Status: Abnormal   Collection Time: 04/06/18 11:32 AM  Result Value Ref Range   Glucose-Capillary 176 (H) 70 - 99 mg/dL   Comment 1 Notify RN   Glucose, capillary     Status: Abnormal   Collection Time: 04/06/18  4:57 PM  Result Value Ref  Range   Glucose-Capillary 209 (H) 70 - 99 mg/dL   Comment 1 Notify RN   Glucose, capillary     Status: Abnormal   Collection Time: 04/06/18  9:39 PM  Result Value Ref Range   Glucose-Capillary 170 (H) 70 - 99 mg/dL  Glucose, capillary     Status: Abnormal   Collection Time: 04/07/18  6:47 AM  Result Value Ref Range   Glucose-Capillary 177 (H) 70 - 99 mg/dL     HEENT: normal Cardio: RRR and no murmur Resp: CTA B/L and unlabored GI: BS positive and NT, ND Extremity:  No Edema Skin:   Intact and Other IV site CDI Neuro: Alert/Oriented, Normal Sensory, Abnormal Motor 4= Left delt bi tri grip HF, KE 4- L ankle DF, Abnormal FMC Ataxic/ dec FMC and Dysarthric Musc/Skel:  Other RIght sided neck pain with ROM Gen NAD   Assessment/Plan: 1. 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 Physiatrist is providing close team supervision and 24 hour management of active medical problems listed below. Physiatrist and rehab team continue to assess barriers to discharge/monitor patient progress toward functional  and medical goals. FIM: Function - Bathing Position: Shower Body parts bathed by patient: Right arm, Left arm, Chest, Abdomen, Front perineal area, Right upper leg, Left upper leg, Buttocks Body parts bathed by helper: Right lower leg, Left lower leg, Back Assist Level: Touching or steadying assistance(Pt > 75%)(min assist)  Function- Upper Body Dressing/Undressing What is the patient wearing?: Pull over shirt/dress Pull over shirt/dress - Perfomed by patient: Thread/unthread right sleeve, Thread/unthread left sleeve, Put head through opening, Pull shirt over trunk Pull over shirt/dress - Perfomed by helper: Pull shirt over trunk Assist Level: Set up Set up : To obtain clothing/put away Function - Lower Body Dressing/Undressing What is the  patient wearing?: Pants, Underwear, Ted Hose, Non-skid slipper socks Position: Sitting EOB Underwear - Performed by patient: Thread/unthread right underwear leg, Thread/unthread left underwear leg, Pull underwear up/down Pants- Performed by patient: Thread/unthread right pants leg, Thread/unthread left pants leg, Pull pants up/down Pants- Performed by helper: Pull pants up/down Non-skid slipper socks- Performed by patient: Don/doff right sock, Don/doff left sock Non-skid slipper socks- Performed by helper: Don/doff right sock, Don/doff left sock Shoes - Performed by patient: Don/doff right shoe, Don/doff left shoe, Fasten right, Fasten left TED Hose - Performed by helper: Don/doff right TED hose, Don/doff left TED hose Assist for footwear: Partial/moderate assist Assist for lower body dressing: Touching or steadying assistance (Pt > 75%)(min assist)  Function - Toileting Toileting steps completed by patient: Adjust clothing prior to toileting, Performs perineal hygiene, Adjust clothing after toileting Toileting steps completed by helper: Adjust clothing prior to toileting, Performs perineal hygiene, Adjust clothing after toileting Toileting Assistive Devices: Grab bar or rail Assist level: Touching or steadying assistance (Pt.75%)  Function - Air cabin crew transfer assistive device: Grab bar, Walker Assist level to toilet: Touching or steadying assistance (Pt > 75%) Assist level from toilet: Touching or steadying assistance (Pt > 75%) Assist level to bedside commode (at bedside): Touching or steadying assistance (Pt > 75%) Assist level from bedside commode (at bedside): Moderate assist (Pt 50 - 74%/lift or lower)(per Elby Beck, NT)  Function - Chair/bed transfer Chair/bed transfer method: Ambulatory Chair/bed transfer assist level: Touching or steadying assistance (Pt > 75%) Chair/bed transfer assistive device: Armrests, Walker Chair/bed transfer details: Verbal cues for  sequencing, Verbal cues for technique  Function - Locomotion: Wheelchair Will patient use wheelchair at discharge?: No Type: Manual Max wheelchair distance: 75 Assist Level: Supervision or verbal cues Assist Level: Supervision or verbal cues Wheel 150 feet activity did not occur: Safety/medical concerns(fatigue) Turns around,maneuvers to table,bed, and toilet,negotiates 3% grade,maneuvers on rugs and over doorsills: No Function - Locomotion: Ambulation Assistive device: Walker-rolling Max distance: 120 Assist level: Touching or steadying assistance (Pt > 75%) Assist level: Supervision or verbal cues Assist level: Touching or steadying assistance (Pt > 75%) Walk 150 feet activity did not occur: Safety/medical concerns(fatigue) Assist level: Touching or steadying assistance (Pt > 75%) Walk 10 feet on uneven surfaces activity did not occur: Safety/medical concerns Assist level: Touching or steadying assistance (Pt > 75%)  Function - Comprehension Comprehension: Auditory Comprehension assist level: Understands complex 90% of the time/cues 10% of the time  Function - Expression Expression: Verbal Expression assist level: Expresses complex 90% of the time/cues < 10% of the time  Function - Social Interaction Social Interaction assist level: Interacts appropriately 90% of the time - Needs monitoring or encouragement for participation or interaction.  Function - Problem Solving Problem solving assist level: Solves basic 90% of the time/requires cueing < 10%  of the time  Function - Memory Memory assist level: Recognizes or recalls 75 - 89% of the time/requires cueing 10 - 24% of the time Patient normally able to recall (first 3 days only): Current season, Location of own room, Staff names and faces, That he or she is in a hospital  Medical Problem List and Plan: 1.  Left side weakness with facial droop and slurred speech secondary to right paramedian pontine infarct secondary to small  vessel disease Did not receive TPA, DAPT x 3 wk which is through 9/20 then Plavix alone CIR PT<, OT, SLP-2.  DVT Prophylaxis/Anticoagulation: Subcutaneous heparin 5000U TID, no Lovenox due to Cr Cl 3. Pain Management: Tylenol as needed  4. Mood: Remeron 15 mg nightly, Effexor or 75 mg every other day, Ativan 0.4 mg 4 times daily, Abilify 5 mg daily Dr Sima Matas to follow- some lability reported when challanged per SLP- focus on functional activities 5. Neuropsych: This patient is capable of making decisions on her own behalf. 6. Skin/Wound Care: Routine skin checks 7. Fluids/Electrolytes/Nutrition: Routine in and outs with follow-up chemistries 8.  Diabetes mellitus.  Hemoglobin A1c 8.6.  home dose NovoLog 70/30 10 units twice daily.  Check blood sugars before meals and at bedtime CBG (last 3)  Recent Labs    04/06/18 1657 04/06/18 2139 04/07/18 0647  GLUCAP 209* 170* 177*  CBG controlled 9/13 still some elevated readings will increase to 14U  9.  History of tachycardia with ablation 2007.  No chest pain or shortness of breath. 10.  CKD stage III.  Baseline creatinine 1.95.  Follow-up chemistries Creat is baseline BUN mildly elevated, Cr Cl <30 11.  History of breast cancer with lumpectomy.  Follow-up outpatient 12.  Hyperlipidemia.  Lipitor- 80mg    LOS (Days) 10 A FACE TO FACE EVALUATION WAS PERFORMED  Charlett Blake 04/07/2018, 8:10 AM

## 2018-04-07 NOTE — Progress Notes (Signed)
Physical Therapy Session Note  Patient Details  Name: [name] MRN: [id number] Date of Birth: 1941-06-10  Today's Date: 04/07/2018 PT Individual Time: 1700-1725 PT Individual Time Calculation (min):50mn   Short Term Goals: Week 2:  PT Short Term Goal 1 (Week 2): STG = LTG due to estimated d/c date.   Skilled Therapeutic Interventions/Progress Updates:   Pt received supine in bed and agreeable to PT. Supine>sit transfer with supevision assist from PT and extra time. Sit<>stand at EOB  And from arm chair with supervision assist and min cues for safety. Gait training with RW 2 x 1656fwith supervision assist from PT. Min cues for improved heel contact on the LLE intermittently throughout gait training. Patient returned to room and left sitting EOB to eat dinner with call bell in reach and all needs met.          Therapy Documentation Precautions:  Precautions Precautions: Fall Precaution Comments: mild L inattention Restrictions Weight Bearing Restrictions: No  Vital Signs: Therapy Vitals Temp: 98.4 F (36.9 C) Temp Source: Oral Pulse Rate: 80 Resp: 18 BP: 127/62 Patient Position (if appropriate): Lying Oxygen Therapy SpO2: 99 % O2 Device: Room Air Pain: denies  See Function Navigator for Current Functional Status.   Therapy/Group: Individual Therapy  AuLorie Phenix/13/2019, 5:09 PM

## 2018-04-08 ENCOUNTER — Inpatient Hospital Stay (HOSPITAL_COMMUNITY): Payer: Medicare Other

## 2018-04-08 ENCOUNTER — Inpatient Hospital Stay (HOSPITAL_COMMUNITY): Payer: Medicare Other | Admitting: Physical Therapy

## 2018-04-08 DIAGNOSIS — E669 Obesity, unspecified: Secondary | ICD-10-CM

## 2018-04-08 DIAGNOSIS — N183 Chronic kidney disease, stage 3 unspecified: Secondary | ICD-10-CM

## 2018-04-08 DIAGNOSIS — R030 Elevated blood-pressure reading, without diagnosis of hypertension: Secondary | ICD-10-CM

## 2018-04-08 DIAGNOSIS — E785 Hyperlipidemia, unspecified: Secondary | ICD-10-CM

## 2018-04-08 DIAGNOSIS — E1169 Type 2 diabetes mellitus with other specified complication: Secondary | ICD-10-CM

## 2018-04-08 LAB — GLUCOSE, CAPILLARY
Glucose-Capillary: 154 mg/dL — ABNORMAL HIGH (ref 70–99)
Glucose-Capillary: 122 mg/dL — ABNORMAL HIGH (ref 70–99)
Glucose-Capillary: 197 mg/dL — ABNORMAL HIGH (ref 70–99)
Glucose-Capillary: 161 mg/dL — ABNORMAL HIGH (ref 70–99)

## 2018-04-08 NOTE — Progress Notes (Signed)
Social Work Patient ID: [name], female   DOB: January 13, 1941, 77 y.o.   MRN: [id number]   CSW met with pt and her son 04-05-18 to update them on team conference discussion and targeted d/c date of 04-12-18, as well as downgraded goals with recommendation of 24/7 supervision.  Pt's son asking for specifics as to why this is recommendation if stroke did not really impact her cognitively.  OT, Simonne Come, present for her session and she gave son examples of why we are making this recommendation.  CSW talked with son further in room and then on the phone to give him supervision options.  He later had his wife call CSW to discuss this with her.  CSW also met with them together on 04-06-18 and further explained options to include pt moving in with family, hiring paid caregivers, adult day programs, assisted living, etc.  CSW called several ALFs to check availability and dtr-in-law to tour.  CSW will remain available to assist as needed once family decides on pt's d/c disposition.

## 2018-04-08 NOTE — Patient Care Conference (Signed)
Inpatient RehabilitationTeam Conference and Plan of Care Update Date: 04/05/2018   Time: 10:45 AM    Patient Name: [name]      Medical Record Number: [id number]  Date of Birth: 08-13-1940 Sex: Female         Room/Bed: 4W19C/4W19C-01 Payor Info: Payor: [employer] MEDICARE / Plan: [employer] MEDICARE / Product Type: *No Product type* /    Admitting Diagnosis: Rt Pontine infarct  Admit Date/Time:  03/28/2018  4:52 PM Admission Comments: No comment available   Primary Diagnosis:  <principal problem not specified> Principal Problem: <principal problem not specified>  Patient Active Problem List   Diagnosis Date Noted  . Small vessel disease ([place]) 03/28/2018  . Diabetes mellitus type 2 in nonobese ([facility])   . Anxiety state   . Stage 3 chronic kidney disease ([place])   . Hyperlipidemia   . History of breast cancer   . Acute ischemic stroke ([place])   . TIA (transient ischemic attack) 03/24/2018  . Dyspnea on exertion 05/30/2017  . Sinus tachycardia 05/30/2017  . Mixed hyperlipidemia 05/30/2017  . Essential hypertension 05/30/2017    Expected Discharge Date: Expected Discharge Date: 04/12/18  Team Members Present: Physician leading conference: Dr. Alysia Penna Social Worker Present: Alfonse Alpers, LCSW Nurse Present: Rayetta Pigg, RN PT Present: Leavy Cella, PT OT Present: Simonne Come, OT SLP Present: Charolett Bumpers, SLP PPS Coordinator present : Ileana Ladd, PT     Current Status/Progress Goal Weekly Team Focus  Medical   Bowels sluggish, team rec 24/7 sup, memory issues , 12 steps with rail  reduce fall risk, some poor attention to environment  establish d/c plan   Bowel/Bladder   continent of B & B, LBM 9/10 after multiple interventions  Remains continent of bladder and bowel  regular bowel routine   Swallow/Nutrition/ Hydration             ADL's   min guard overall  downgraded to supervision due to impaired awareness and memory  ADL retraining, LUE NMR, dynamic  standing balance, simple meal prep, awareness   Mobility   steady assist gait with RW, mild L inattention, poor endurance, supervision<>steady assist transfers, impaired memory  supervision overall with LRAD  safety awareness, transfers, gait, stair negotiation, balance, endurance, strengthening, L NMR   Communication   Mod I conversation   Mod I - goal met       Safety/Cognition/ Behavioral Observations  Min - Supervision A  Supervision A semi-complex problem solving - downgraded 9/9  semi-complex problem solving, error awareness, selective attention, beginning anticipatory awareness   Pain   no c/o pain  Pain < or = 2  assess & treat as needed   Skin   bruising to abdomen from heparin, no skin break down  Patient remain free of infection or additional ecchymosis  assess q shift    Rehab Goals Patient on target to meet rehab goals: Yes Rehab Goals Revised: Therapy now recommending 24/7 supervision due to cognition/baseline deficits? *See Care Plan and progress notes for long and short-term goals.     Barriers to Discharge  Current Status/Progress Possible Resolutions Date Resolved   Physician    Inaccessible home environment;Decreased caregiver support;Medical stability     D/C planning   consider ALF, vs adult day program      Nursing                  PT  Decreased caregiver support;Home environment access/layout;Lack of/limited family support  recommending 24 hr supervision 2/2  impaired memory/safety awareness              OT                  SLP                SW                Discharge Planning/Teaching Needs:  Family is deciding how to best provide pt with 24/7 supervision.  To be offered closer to d/c, if pt goes home with family.     Team Discussion:  Pt is doing well medically, has some emotional lability with long hx of anxiety, as well.  Pt has needed a suppository the las 2/3 BMs.  MD increased pt's senna.  Per OT, pt with poor memory and awareness.  Pt is min  A to min guard with tx and self care.  Pt has met speech goal and problem solving goals has been downgraded, but it is suspected that pt has some baseline deficits based on info received from family.  Pt's dtr-in-law does finances for pt.  Pt has completed 12 steps with rail at min A.  She has poor recall and goals have been downgraded to supervision.   Revisions to Treatment Plan:  none    Continued Need for Acute Rehabilitation Level of Care: The patient requires daily medical management by a physician with specialized training in physical medicine and rehabilitation for the following conditions: Daily direction of a multidisciplinary physical rehabilitation program to ensure safe treatment while eliciting the highest outcome that is of practical value to the patient.: Yes Daily medical management of patient stability for increased activity during participation in an intensive rehabilitation regime.: Yes Daily analysis of laboratory values and/or radiology reports with any subsequent need for medication adjustment of medical intervention for : Neurological problems;Diabetes problems;Blood pressure problems   I attest that I was present, lead the team conference, and concur with the assessment and plan of the team.   Alexiz Cothran, Silvestre Mesi 04/08/2018, 12:29 AM

## 2018-04-08 NOTE — Progress Notes (Signed)
Subjective/Complaints: Patient seen initially ambulating from the restroom and then laying down this morning. She states she slept fairly overnight. She states she feels she is getting stronger.  ROS  Denies CP, SOB, N/V/D  Objective: Vital Signs: Blood pressure (!) 145/70, pulse 87, temperature 97.8 F (36.6 C), temperature source Oral, resp. rate 18, height 5\' 7"  (1.702 m), weight 85 kg, SpO2 97 %. No results found. Results for orders placed or performed during the hospital encounter of 03/28/18 (from the past 72 hour(s))  Glucose, capillary     Status: Abnormal   Collection Time: 04/05/18 11:16 AM  Result Value Ref Range   Glucose-Capillary 216 (H) 70 - 99 mg/dL  Glucose, capillary     Status: Abnormal   Collection Time: 04/05/18  4:36 PM  Result Value Ref Range   Glucose-Capillary 161 (H) 70 - 99 mg/dL  Glucose, capillary     Status: Abnormal   Collection Time: 04/05/18  9:41 PM  Result Value Ref Range   Glucose-Capillary 194 (H) 70 - 99 mg/dL  Glucose, capillary     Status: Abnormal   Collection Time: 04/06/18  7:11 AM  Result Value Ref Range   Glucose-Capillary 155 (H) 70 - 99 mg/dL  Glucose, capillary     Status: Abnormal   Collection Time: 04/06/18 11:32 AM  Result Value Ref Range   Glucose-Capillary 176 (H) 70 - 99 mg/dL   Comment 1 Notify RN   Glucose, capillary     Status: Abnormal   Collection Time: 04/06/18  4:57 PM  Result Value Ref Range   Glucose-Capillary 209 (H) 70 - 99 mg/dL   Comment 1 Notify RN   Glucose, capillary     Status: Abnormal   Collection Time: 04/06/18  9:39 PM  Result Value Ref Range   Glucose-Capillary 170 (H) 70 - 99 mg/dL  Glucose, capillary     Status: Abnormal   Collection Time: 04/07/18  6:47 AM  Result Value Ref Range   Glucose-Capillary 177 (H) 70 - 99 mg/dL  Glucose, capillary     Status: Abnormal   Collection Time: 04/07/18 12:06 PM  Result Value Ref Range   Glucose-Capillary 145 (H) 70 - 99 mg/dL  Glucose, capillary      Status: Abnormal   Collection Time: 04/07/18  4:39 PM  Result Value Ref Range   Glucose-Capillary 171 (H) 70 - 99 mg/dL   Comment 1 Notify RN   Glucose, capillary     Status: Abnormal   Collection Time: 04/07/18  9:16 PM  Result Value Ref Range   Glucose-Capillary 146 (H) 70 - 99 mg/dL   Comment 1 Notify RN   Glucose, capillary     Status: Abnormal   Collection Time: 04/08/18  6:25 AM  Result Value Ref Range   Glucose-Capillary 154 (H) 70 - 99 mg/dL   Comment 1 Notify RN      Constitutional: No distress . Vital signs reviewed. HENT: Normocephalic.  Atraumatic. Eyes: EOMI. No discharge. Cardiovascular: RRR. No JVD. Respiratory: CTA Bilaterally. Normal effort. GI: BS +. Non-distended. Musc: No edema or tenderness in extremities. Skin:   Warm and dry. Intact. Neuro: Alert/Oriented,  Motor: 4/5 LUE proximal to distal 4+/5 RUE proximal to distal  Assessment/Plan: 1. 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 Physiatrist is providing close team supervision and 24 hour management of active medical problems listed below. Physiatrist and rehab team  continue to assess barriers to discharge/monitor patient progress toward functional and medical goals. FIM: Function - Bathing Position: Shower Body parts bathed by patient: Right arm, Left arm, Chest, Abdomen, Front perineal area, Right upper leg, Left upper leg, Buttocks Body parts bathed by helper: Right lower leg, Left lower leg, Back Assist Level: Touching or steadying assistance(Pt > 75%)(min assist)  Function- Upper Body Dressing/Undressing What is the patient wearing?: Pull over shirt/dress Pull over shirt/dress - Perfomed by patient: Thread/unthread right sleeve, Thread/unthread left sleeve, Put head through opening, Pull shirt over trunk Pull over shirt/dress - Perfomed by helper:  Pull shirt over trunk Assist Level: Set up Set up : To obtain clothing/put away Function - Lower Body Dressing/Undressing What is the patient wearing?: Pants, Underwear, Ted Hose, Non-skid slipper socks Position: Sitting EOB Underwear - Performed by patient: Thread/unthread right underwear leg, Thread/unthread left underwear leg, Pull underwear up/down Pants- Performed by patient: Thread/unthread right pants leg, Thread/unthread left pants leg, Pull pants up/down Pants- Performed by helper: Pull pants up/down Non-skid slipper socks- Performed by patient: Don/doff right sock, Don/doff left sock Non-skid slipper socks- Performed by helper: Don/doff right sock, Don/doff left sock Shoes - Performed by patient: Don/doff right shoe, Don/doff left shoe, Fasten right, Fasten left TED Hose - Performed by helper: Don/doff right TED hose, Don/doff left TED hose Assist for footwear: Partial/moderate assist Assist for lower body dressing: Touching or steadying assistance (Pt > 75%)(min assist)  Function - Toileting Toileting steps completed by patient: Adjust clothing prior to toileting, Performs perineal hygiene, Adjust clothing after toileting Toileting steps completed by helper: Adjust clothing prior to toileting, Performs perineal hygiene, Adjust clothing after toileting Toileting Assistive Devices: Grab bar or rail Assist level: Touching or steadying assistance (Pt.75%)  Function - Air cabin crew transfer assistive device: Grab bar, Walker Assist level to toilet: Supervision or verbal cues Assist level from toilet: Supervision or verbal cues Assist level to bedside commode (at bedside): Touching or steadying assistance (Pt > 75%) Assist level from bedside commode (at bedside): Moderate assist (Pt 50 - 74%/lift or lower)(per Elby Beck, NT)  Function - Chair/bed transfer Chair/bed transfer method: Ambulatory Chair/bed transfer assist level: Touching or steadying assistance (Pt >  75%) Chair/bed transfer assistive device: Armrests, Walker Chair/bed transfer details: Verbal cues for sequencing, Verbal cues for technique  Function - Locomotion: Wheelchair Will patient use wheelchair at discharge?: No Type: Manual Max wheelchair distance: 75 Assist Level: Supervision or verbal cues Assist Level: Supervision or verbal cues Wheel 150 feet activity did not occur: Safety/medical concerns(fatigue) Turns around,maneuvers to table,bed, and toilet,negotiates 3% grade,maneuvers on rugs and over doorsills: No Function - Locomotion: Ambulation Assistive device: Walker-rolling Max distance: 150 ft  Assist level: Supervision or verbal cues Assist level: Supervision or verbal cues Assist level: Supervision or verbal cues Walk 150 feet activity did not occur: Safety/medical concerns(fatigue) Assist level: Supervision or verbal cues Walk 10 feet on uneven surfaces activity did not occur: Safety/medical concerns Assist level: Supervision or verbal cues  Function - Comprehension Comprehension: Auditory Comprehension assist level: Understands complex 90% of the time/cues 10% of the time  Function - Expression Expression: Verbal Expression assist level: Expresses complex 90% of the time/cues < 10% of the time  Function - Social Interaction Social Interaction assist level: Interacts appropriately 90% of the time - Needs monitoring or encouragement for participation or interaction.  Function - Problem Solving Problem solving assist level: Solves complex 90% of the time/cues < 10% of the time  Function - Memory  Memory assist level: Recognizes or recalls 75 - 89% of the time/requires cueing 10 - 24% of the time Patient normally able to recall (first 3 days only): Current season, Location of own room, Staff names and faces, That he or she is in a hospital  Medical Problem List and Plan: 1.  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  DVT Prophylaxis/Anticoagulation: Subcutaneous heparin 5000U TID, no Lovenox due to Cr Cl 3. Pain Management: Tylenol as needed 4. Mood: Remeron 15 mg nightly, Effexor or 75 mg every other day, Ativan 0.4 mg 4 times daily, Abilify 5 mg daily Dr Sima Matas to follow- some lability reported when challanged per SLP- focus on functional activities 5. Neuropsych: This patient is capable of making decisions on her own behalf. 6. Skin/Wound Care: Routine skin checks 7. Fluids/Electrolytes/Nutrition: Routine in and outs 8.  Diabetes mellitus.  Hemoglobin A1c 8.6.    Check blood sugars before meals and at bedtime CBG (last 3)  Recent Labs    04/07/18 1639 04/07/18 2116 04/08/18 0625  GLUCAP 171* 146* 154*   Home dose NovoLog 70/30 10 units twice daily increased to 14U on 9/13  Remains elevated, will consider further medication adjustments tomorrow if warranted 9.  History of tachycardia with ablation 2007.  No chest pain or shortness of breath. 10.  CKD stage III.  Baseline creatinine 1.95.    Creatinine 1.91 9/4  Labs ordered for Monday 11.  History of breast cancer with lumpectomy.  Follow-up outpatient 12.  Hyperlipidemia.  Lipitor- 80mg   13. Elevated blood pressure reading  Elevated this morning, was controlled  LOS (Days) 11 A FACE TO FACE EVALUATION WAS PERFORMED  Daneen Volcy Lorie Phenix 04/08/2018, 11:00 AM

## 2018-04-08 NOTE — Progress Notes (Signed)
Occupational Therapy Session Note  Patient Details  Name: [name] MRN: [id number] Date of Birth: 1941/03/30  Today's Date: 04/08/2018 OT Individual Time: 1300-1400 OT Individual Time Calculation (min): 60 min   Short Term Goals: Week 2:  OT Short Term Goal 1 (Week 2): STG = LTGs due to remaining LOS  Skilled Therapeutic Interventions/Progress Updates:    Pt sitting up EOB awaiting therapy with no c/o pain. Pt completed functional mobility into bathroom with Rw with CGA. 3/3 toileting tasks performed with (S) and good use of grab bars. Pt transferred into shower on TTB with CGA. Pt completed UB/LB bathing with set up. Pt initially requesting assistance with distal LE, but with encouragement to perform figure 4 technique pt able to complete on her own. Pt dressed in shower with min A for threading LLE into pants. Pt donned B socks sitting EOB with (S). Pt stood at sink and performed grooming tasks with (S). Pt completed 150 ft of functional mobility with (S). Pt left supine in bed with all needs met.   Therapy Documentation Precautions:  Precautions Precautions: Fall Precaution Comments: mild L inattention Restrictions Weight Bearing Restrictions: No Therapy Vitals Temp: 98.6 F (37 C) Temp Source: Oral Pulse Rate: (!) 106 Resp: 15 BP: 125/77 Patient Position (if appropriate): Lying Oxygen Therapy SpO2: 96 % O2 Device: Room Air Pain: Pain Assessment Pain Scale: 0-10 Pain Score: 0-No pain  See Function Navigator for Current Functional Status.   Therapy/Group: Individual Therapy  Curtis Sites 04/08/2018, 4:27 PM

## 2018-04-08 NOTE — Progress Notes (Signed)
Physical Therapy Session Note  Patient Details  Name: [name] MRN: [id number] Date of Birth: 12/22/1940  Today's Date: 04/08/2018 PT Individual Time: 1515-1540 PT Individual Time Calculation (min): 25 min   Short Term Goals: Week 2:  PT Short Term Goal 1 (Week 2): STG = LTG due to estimated d/c date.  Skilled Therapeutic Interventions/Progress Updates:   Pt in supine and agreeable to therapy, no c/o pain. Worked on gait training and endurance w/o AD this session. Pt donned and doffed shoes w/ set-up assist. Ambulated around unit w/o AD, >150' bouts, w/ min assist. Min assist provided for lateral weight shifting and verbal cues for gait pattern. Seated rest break in between bouts 2/2 fatigue. During walks and rest breaks, discussed stroke risk factors and signs of a stroke, educated pt on watching for these in the future as she is at an increased risk of another stroke. Educated her on ways to have an active lifestyle other than "exercise". Returned to room and ended session in supine, call bell within reach and all needs met.   Therapy Documentation Precautions:  Precautions Precautions: Fall Precaution Comments: mild L inattention Restrictions Weight Bearing Restrictions: No Vital Signs: Therapy Vitals Temp: 98.6 F (37 C) Temp Source: Oral Pulse Rate: (!) 106 Resp: 15 BP: 125/77 Patient Position (if appropriate): Lying Oxygen Therapy SpO2: 96 % O2 Device: Room Air Pain: Pain Assessment Pain Scale: 0-10 Pain Score: 0-No pain  See Function Navigator for Current Functional Status.   Therapy/Group: Individual Therapy   K Arnette 04/08/2018, 3:44 PM  

## 2018-04-09 ENCOUNTER — Inpatient Hospital Stay (HOSPITAL_COMMUNITY): Payer: Medicare Other

## 2018-04-09 ENCOUNTER — Inpatient Hospital Stay (HOSPITAL_COMMUNITY): Payer: Medicare Other | Admitting: Physical Therapy

## 2018-04-09 DIAGNOSIS — R0989 Other specified symptoms and signs involving the circulatory and respiratory systems: Secondary | ICD-10-CM

## 2018-04-09 LAB — GLUCOSE, CAPILLARY
Glucose-Capillary: 130 mg/dL — ABNORMAL HIGH (ref 70–99)
Glucose-Capillary: 207 mg/dL — ABNORMAL HIGH (ref 70–99)
Glucose-Capillary: 144 mg/dL — ABNORMAL HIGH (ref 70–99)
Glucose-Capillary: 196 mg/dL — ABNORMAL HIGH (ref 70–99)

## 2018-04-09 NOTE — Progress Notes (Signed)
Subjective/Complaints: Patient seen lying in bed this morning. She states she slept well overnight. She is appreciative of her care.  ROS: denies CP, SOB, N/V/D  Objective: Vital Signs: Blood pressure (!) 144/75, pulse 84, temperature 98.3 F (36.8 C), temperature source Oral, resp. rate 17, height 5\' 7"  (1.702 m), weight 85 kg, SpO2 97 %. No results found. Results for orders placed or performed during the hospital encounter of 03/28/18 (from the past 72 hour(s))  Glucose, capillary     Status: Abnormal   Collection Time: 04/06/18 11:32 AM  Result Value Ref Range   Glucose-Capillary 176 (H) 70 - 99 mg/dL   Comment 1 Notify RN   Glucose, capillary     Status: Abnormal   Collection Time: 04/06/18  4:57 PM  Result Value Ref Range   Glucose-Capillary 209 (H) 70 - 99 mg/dL   Comment 1 Notify RN   Glucose, capillary     Status: Abnormal   Collection Time: 04/06/18  9:39 PM  Result Value Ref Range   Glucose-Capillary 170 (H) 70 - 99 mg/dL  Glucose, capillary     Status: Abnormal   Collection Time: 04/07/18  6:47 AM  Result Value Ref Range   Glucose-Capillary 177 (H) 70 - 99 mg/dL  Glucose, capillary     Status: Abnormal   Collection Time: 04/07/18 12:06 PM  Result Value Ref Range   Glucose-Capillary 145 (H) 70 - 99 mg/dL  Glucose, capillary     Status: Abnormal   Collection Time: 04/07/18  4:39 PM  Result Value Ref Range   Glucose-Capillary 171 (H) 70 - 99 mg/dL   Comment 1 Notify RN   Glucose, capillary     Status: Abnormal   Collection Time: 04/07/18  9:16 PM  Result Value Ref Range   Glucose-Capillary 146 (H) 70 - 99 mg/dL   Comment 1 Notify RN   Glucose, capillary     Status: Abnormal   Collection Time: 04/08/18  6:25 AM  Result Value Ref Range   Glucose-Capillary 154 (H) 70 - 99 mg/dL   Comment 1 Notify RN   Glucose, capillary     Status: Abnormal   Collection Time: 04/08/18 11:35 AM  Result Value Ref Range   Glucose-Capillary 122 (H) 70 - 99 mg/dL  Glucose,  capillary     Status: Abnormal   Collection Time: 04/08/18  4:37 PM  Result Value Ref Range   Glucose-Capillary 197 (H) 70 - 99 mg/dL  Glucose, capillary     Status: Abnormal   Collection Time: 04/08/18  9:32 PM  Result Value Ref Range   Glucose-Capillary 161 (H) 70 - 99 mg/dL  Glucose, capillary     Status: Abnormal   Collection Time: 04/09/18  6:27 AM  Result Value Ref Range   Glucose-Capillary 130 (H) 70 - 99 mg/dL     Constitutional: No distress . Vital signs reviewed. HENT: Normocephalic.  Atraumatic. Eyes: EOMI. No discharge. Cardiovascular: RRR. No JVD. Respiratory: CTA bilaterally. Normal effort. GI: BS +. Non-distended. Musc: No edema or tenderness in extremities. Skin:   Warm and dry. Intact. Neuro: Alert/Oriented,  Motor: 4--4/5 LUE proximal to distal 4-/5 proximal to distal LLE 4+/5 RUE proximal to distal  Assessment/Plan: 1. 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 Physiatrist is providing close team supervision and 24 hour management of active medical problems listed below. Physiatrist and rehab team continue to assess barriers  to discharge/monitor patient progress toward functional and medical goals. FIM: Function - Bathing Position: Shower Body parts bathed by patient: Right arm, Left arm, Chest, Abdomen, Front perineal area, Right upper leg, Left upper leg, Buttocks, Right lower leg, Left lower leg Body parts bathed by helper: Back Assist Level: Touching or steadying assistance(Pt > 75%)  Function- Upper Body Dressing/Undressing What is the patient wearing?: Pull over shirt/dress Pull over shirt/dress - Perfomed by patient: Thread/unthread right sleeve, Thread/unthread left sleeve, Put head through opening, Pull shirt over trunk Pull over shirt/dress - Perfomed by helper: Pull shirt over trunk Assist Level: Set  up Set up : To obtain clothing/put away Function - Lower Body Dressing/Undressing What is the patient wearing?: Underwear, Pants, Non-skid slipper socks Position: Sitting EOB Underwear - Performed by patient: Thread/unthread right underwear leg, Thread/unthread left underwear leg, Pull underwear up/down Pants- Performed by patient: Thread/unthread right pants leg, Pull pants up/down Pants- Performed by helper: Thread/unthread left pants leg Non-skid slipper socks- Performed by patient: Don/doff right sock, Don/doff left sock Non-skid slipper socks- Performed by helper: Don/doff right sock, Don/doff left sock Shoes - Performed by patient: Don/doff right shoe, Don/doff left shoe, Fasten right, Fasten left TED Hose - Performed by helper: Don/doff right TED hose, Don/doff left TED hose Assist for footwear: Supervision/touching assist Assist for lower body dressing: Touching or steadying assistance (Pt > 75%)  Function - Toileting Toileting steps completed by patient: Adjust clothing prior to toileting, Performs perineal hygiene, Adjust clothing after toileting Toileting steps completed by helper: Adjust clothing prior to toileting, Performs perineal hygiene, Adjust clothing after toileting Toileting Assistive Devices: Grab bar or rail Assist level: Supervision or verbal cues  Function - Air cabin crew transfer assistive device: Grab bar, Walker Assist level to toilet: Supervision or verbal cues Assist level from toilet: Supervision or verbal cues Assist level to bedside commode (at bedside): Touching or steadying assistance (Pt > 75%) Assist level from bedside commode (at bedside): Moderate assist (Pt 50 - 74%/lift or lower)(per Elby Beck, NT)  Function - Chair/bed transfer Chair/bed transfer method: Ambulatory Chair/bed transfer assist level: Supervision or verbal cues Chair/bed transfer assistive device: Armrests Chair/bed transfer details: Verbal cues for sequencing, Verbal  cues for technique  Function - Locomotion: Wheelchair Will patient use wheelchair at discharge?: No Type: Manual Max wheelchair distance: 75 Assist Level: Supervision or verbal cues Assist Level: Supervision or verbal cues Wheel 150 feet activity did not occur: Safety/medical concerns(fatigue) Turns around,maneuvers to table,bed, and toilet,negotiates 3% grade,maneuvers on rugs and over doorsills: No Function - Locomotion: Ambulation Assistive device: No device Max distance: 150' Assist level: Touching or steadying assistance (Pt > 75%) Assist level: Touching or steadying assistance (Pt > 75%) Assist level: Touching or steadying assistance (Pt > 75%) Walk 150 feet activity did not occur: Safety/medical concerns(fatigue) Assist level: Touching or steadying assistance (Pt > 75%) Walk 10 feet on uneven surfaces activity did not occur: Safety/medical concerns Assist level: Supervision or verbal cues  Function - Comprehension Comprehension: Auditory Comprehension assist level: Understands complex 90% of the time/cues 10% of the time  Function - Expression Expression: Verbal Expression assist level: Expresses complex 90% of the time/cues < 10% of the time  Function - Social Interaction Social Interaction assist level: Interacts appropriately 90% of the time - Needs monitoring or encouragement for participation or interaction.  Function - Problem Solving Problem solving assist level: Solves complex 90% of the time/cues < 10% of the time  Function - Memory Memory assist level: Recognizes or  recalls 75 - 89% of the time/requires cueing 10 - 24% of the time Patient normally able to recall (first 3 days only): Current season, Location of own room, Staff names and faces, That he or she is in a hospital  Medical Problem List and Plan: 1.  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  DVT Prophylaxis/Anticoagulation: Subcutaneous heparin 5000U TID, no Lovenox due to Cr Cl 3. Pain Management: Tylenol as needed 4. Mood: Remeron 15 mg nightly, Effexor or 75 mg every other day, Ativan 0.4 mg 4 times daily, Abilify 5 mg daily Dr Sima Matas to follow- some lability reported when challanged per SLP- focus on functional activities 5. Neuropsych: This patient is capable of making decisions on her own behalf. 6. Skin/Wound Care: Routine skin checks 7. Fluids/Electrolytes/Nutrition: Routine in and outs 8.  Diabetes mellitus.  Hemoglobin A1c 8.6.    Check blood sugars before meals and at bedtime CBG (last 3)  Recent Labs    04/08/18 1637 04/08/18 2132 04/09/18 0627  GLUCAP 197* 161* 130*   Home dose NovoLog 70/30 10 units twice daily increased to 14U on 9/13  Overall improving, consider further medication adjustments if necessary 9.  History of tachycardia with ablation 2007.  No chest pain or shortness of breath. 10.  CKD stage III.  Baseline creatinine 1.95.    Creatinine 1.91 on 9/4  Labs ordered for tomorrow 11.  History of breast cancer with lumpectomy.  Follow-up outpatient 12.  Hyperlipidemia.  Lipitor- 80mg   13. Elevated blood pressure reading  Labile on 9/15  LOS (Days) 12 A FACE TO FACE EVALUATION WAS PERFORMED  Ankit Lorie Phenix 04/09/2018, 8:06 AM

## 2018-04-09 NOTE — Progress Notes (Signed)
Occupational Therapy Session Note  Patient Details  Name: Nilam Quakenbush MRN: [id number] Date of Birth: Mar 09, 1941  Today's Date: 04/09/2018 OT Group Time: 1100-1200 OT Group Time Calculation (min): 60 min  Skilled Therapeutic Interventions/Progress Updates:    Pt participated in therapeutic w/c level dance group with focus on UE/LE strengthening, activity tolerance, and social participation for carryover during self care tasks. Pt was guided through various dance-based exercises involving UB/LB and trunk.  Emphasis placed on Lt NMR, Lt attention, and standing balance. Pt actively participated throughout group, requested songs, interacted with others, and took minimal rest breaks. HOH for setup of hand-holding with group member on Lt side. She stood during final song with RW and steady assist, swaying hips with unilateral UE support on walker. At end of session she was taken back to room by RT.    Therapy Documentation Precautions:  Precautions Precautions: Fall Precaution Comments: mild L inattention Restrictions Weight Bearing Restrictions: No Vital Signs: Therapy Vitals Temp: 97.8 F (36.6 C) Pulse Rate: 77 Resp: 18 BP: 130/76 Patient Position (if appropriate): Sitting Oxygen Therapy SpO2: 100 % O2 Device: Room Air Pain: No c/o pain during session    ADL:    See Function Navigator for Current Functional Status.   Therapy/Group: Group Therapy  Kaydenn Mclear A Joesiah Lonon 04/09/2018, 4:28 PM

## 2018-04-09 NOTE — Progress Notes (Signed)
Occupational Therapy Session Note  Patient Details  Name: [name] MRN: [id number] Date of Birth: 1940/11/03  Today's Date: 04/09/2018 OT Individual Time: 2761-4709 OT Individual Time Calculation (min): 44 min    Short Term Goals: Week 1:  OT Short Term Goal 1 (Week 1): Pt will complete bathing with min assist at sit > stand level OT Short Term Goal 1 - Progress (Week 1): Met OT Short Term Goal 2 (Week 1): Pt will complete LB dressing with min assist OT Short Term Goal 2 - Progress (Week 1): Met OT Short Term Goal 3 (Week 1): Pt will complete 2 grooming tasks in standing with contact guard for increased activity tolerance OT Short Term Goal 3 - Progress (Week 1): Met  Skilled Therapeutic Interventions/Progress Updates:    1;1. Pt with no c/o pain. Pt declines bathing at shower level as she did this yesterday. Pt selects clothing from w/c level at dresser with increased time to make decision. Pt completes UB bathing at sink with VC for locating items needed at sink. Pt dons shirt and pants sit to stand at sink with supervision and Vc for reaching with LUE to obtain items. Pt demo good energy conservation strategies of threading BLE into pants and underwear before standing to advance both over hips. Pt dons footwear with supervision seated in w/c after OT dons teds. Pt completes opening packages for [facility] and BUE coordination for coffee creamer and sugar. Exited session with pt seated in w/c set up with breakfast and w/c exit alarm on.  Therapy Documentation Precautions:  Precautions Precautions: Fall Precaution Comments: mild L inattention Restrictions Weight Bearing Restrictions: No General:   Vital Signs: Therapy Vitals Pulse Rate: 84 Resp: 17 BP: (!) 144/75 Patient Position (if appropriate): Lying Oxygen Therapy SpO2: 97 % O2 Device: Room Air  See Function Navigator for Current Functional Status.   Therapy/Group: Individual Therapy  Tonny Branch 04/09/2018,  7:11 AM

## 2018-04-09 NOTE — Progress Notes (Signed)
Physical Therapy Session Note  Patient Details  Name: [name] MRN: [id number] Date of Birth: 1941-07-02  Today's Date: 04/09/2018 PT Individual Time: 5974-1638 PT Individual Time Calculation (min): 54 min   Short Term Goals: Week 2:  PT Short Term Goal 1 (Week 2): STG = LTG due to estimated d/c date.  Skilled Therapeutic Interventions/Progress Updates:   Pt in w/c and agreeable to therapy, no c/o pain. Pt ambulated to/from therapy gym w/o AD w/ verbal cues for gait pattern and tactile cues for postural control. Worked on pre-gait tasks in gym w/o UE support. Performed alternating stepping to 2" step, 3x20 w/ min assist for balance, to work on increasing foot clearance and hip flexion bilaterally. Worked on stepping strategies to visual targets w/ BLEs, called out random numbers (on target) for pt to step to, verbal cues for full foot clearance w/ both forward and back steps. Ambulated 50-100' at a time w/ theraband tied around knees for tactile cues for increased BOS. With all gait and pregait tasks this session, emphasized keeping a wider BOS to decrease scissoring. Close supervision to min assist overall for all gait/pregait tasks. Returned to room and ended session in w/c, call bell within reach and all needs met.   Therapy Documentation Precautions:  Precautions Precautions: Fall Precaution Comments: mild L inattention Restrictions Weight Bearing Restrictions: No Vital Signs: Therapy Vitals Temp: 97.8 F (36.6 C) Pulse Rate: 77 Resp: 18 BP: 130/76 Patient Position (if appropriate): Sitting Oxygen Therapy SpO2: 100 % O2 Device: Room Air  See Function Navigator for Current Functional Status.   Therapy/Group: Individual Therapy  Dusan Lipford K Arnette 04/09/2018, 4:08 PM

## 2018-04-10 ENCOUNTER — Encounter (HOSPITAL_COMMUNITY): Payer: Medicare Other | Admitting: Psychology

## 2018-04-10 ENCOUNTER — Inpatient Hospital Stay (HOSPITAL_COMMUNITY): Payer: Medicare Other | Admitting: Physical Therapy

## 2018-04-10 ENCOUNTER — Inpatient Hospital Stay (HOSPITAL_COMMUNITY): Payer: Medicare Other | Admitting: Occupational Therapy

## 2018-04-10 ENCOUNTER — Inpatient Hospital Stay (HOSPITAL_COMMUNITY): Payer: Medicare Other

## 2018-04-10 LAB — GLUCOSE, CAPILLARY
Glucose-Capillary: 155 mg/dL — ABNORMAL HIGH (ref 70–99)
Glucose-Capillary: 162 mg/dL — ABNORMAL HIGH (ref 70–99)
Glucose-Capillary: 131 mg/dL — ABNORMAL HIGH (ref 70–99)
Glucose-Capillary: 115 mg/dL — ABNORMAL HIGH (ref 70–99)

## 2018-04-10 LAB — BASIC METABOLIC PANEL
Anion gap: 10 (ref 5–15)
BUN: 37 mg/dL — ABNORMAL HIGH (ref 8–23)
CO2: 23 mmol/L (ref 22–32)
Calcium: 9.8 mg/dL (ref 8.9–10.3)
Chloride: 106 mmol/L (ref 98–111)
Creatinine, Ser: 1.97 mg/dL — ABNORMAL HIGH (ref 0.44–1.00)
GFR calc Af Amer: 27 mL/min — ABNORMAL LOW (ref >60)
GFR calc non Af Amer: 23 mL/min — ABNORMAL LOW (ref >60)
Glucose, Bld: 214 mg/dL — ABNORMAL HIGH (ref 70–99)
Potassium: 4.5 mmol/L (ref 3.5–5.1)
Sodium: 139 mmol/L (ref 135–145)

## 2018-04-10 MED ORDER — INSULIN STARTER KIT- PEN NEEDLES (ENGLISH)
1.0000 | Freq: Once | Status: DC
  Filled 2018-04-10 (×2): qty 1

## 2018-04-10 NOTE — Progress Notes (Signed)
Subjective/Complaints:  No issues overnite  ROS: denies CP, SOB, N/V/D  Objective: Vital Signs: Blood pressure 110/69, pulse 81, temperature 98.3 F (36.8 C), temperature source Oral, resp. rate 19, height 5\' 7"  (1.702 m), weight 85 kg, SpO2 98 %. No results found. Results for orders placed or performed during the hospital encounter of 03/28/18 (from the past 72 hour(s))  Glucose, capillary     Status: Abnormal   Collection Time: 04/07/18 12:06 PM  Result Value Ref Range   Glucose-Capillary 145 (H) 70 - 99 mg/dL  Glucose, capillary     Status: Abnormal   Collection Time: 04/07/18  4:39 PM  Result Value Ref Range   Glucose-Capillary 171 (H) 70 - 99 mg/dL   Comment 1 Notify RN   Glucose, capillary     Status: Abnormal   Collection Time: 04/07/18  9:16 PM  Result Value Ref Range   Glucose-Capillary 146 (H) 70 - 99 mg/dL   Comment 1 Notify RN   Glucose, capillary     Status: Abnormal   Collection Time: 04/08/18  6:25 AM  Result Value Ref Range   Glucose-Capillary 154 (H) 70 - 99 mg/dL   Comment 1 Notify RN   Glucose, capillary     Status: Abnormal   Collection Time: 04/08/18 11:35 AM  Result Value Ref Range   Glucose-Capillary 122 (H) 70 - 99 mg/dL  Glucose, capillary     Status: Abnormal   Collection Time: 04/08/18  4:37 PM  Result Value Ref Range   Glucose-Capillary 197 (H) 70 - 99 mg/dL  Glucose, capillary     Status: Abnormal   Collection Time: 04/08/18  9:32 PM  Result Value Ref Range   Glucose-Capillary 161 (H) 70 - 99 mg/dL  Glucose, capillary     Status: Abnormal   Collection Time: 04/09/18  6:27 AM  Result Value Ref Range   Glucose-Capillary 130 (H) 70 - 99 mg/dL  Glucose, capillary     Status: Abnormal   Collection Time: 04/09/18 12:08 PM  Result Value Ref Range   Glucose-Capillary 207 (H) 70 - 99 mg/dL  Glucose, capillary     Status: Abnormal   Collection Time: 04/09/18  4:24 PM  Result Value Ref Range   Glucose-Capillary 144 (H) 70 - 99 mg/dL  Glucose,  capillary     Status: Abnormal   Collection Time: 04/09/18  9:48 PM  Result Value Ref Range   Glucose-Capillary 196 (H) 70 - 99 mg/dL   Comment 1 Notify RN   Glucose, capillary     Status: Abnormal   Collection Time: 04/10/18  6:23 AM  Result Value Ref Range   Glucose-Capillary 155 (H) 70 - 99 mg/dL   Comment 1 Notify RN      Constitutional: No distress . Vital signs reviewed. HENT: Normocephalic.  Atraumatic. Eyes: EOMI. No discharge. Cardiovascular: RRR. No JVD. Respiratory: CTA bilaterally. Normal effort. GI: BS +. Non-distended. Musc: No edema or tenderness in extremities. Skin:   Warm and dry. Intact. Neuro: Alert/Oriented,  Motor: 4--4/5 LUE proximal to distal 4-/5 proximal to distal LLE 4+/5 RUE proximal to distal  Assessment/Plan: 1. 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 Physiatrist is providing close team supervision and 24 hour management of active medical problems listed below. Physiatrist and rehab team continue to assess barriers to discharge/monitor patient progress toward functional and medical goals. FIM: Function - Bathing Position: Research scientist (life sciences)  parts bathed by patient: Right arm, Left arm, Chest, Abdomen, Front perineal area, Right upper leg, Left upper leg, Buttocks, Right lower leg, Left lower leg Body parts bathed by helper: Back Assist Level: Touching or steadying assistance(Pt > 75%)  Function- Upper Body Dressing/Undressing What is the patient wearing?: Pull over shirt/dress Pull over shirt/dress - Perfomed by patient: Thread/unthread right sleeve, Thread/unthread left sleeve, Put head through opening, Pull shirt over trunk Pull over shirt/dress - Perfomed by helper: Pull shirt over trunk Assist Level: Set up Set up : To obtain clothing/put away Function - Lower Body Dressing/Undressing What is the  patient wearing?: Underwear, Pants, Non-skid slipper socks Position: Sitting EOB Underwear - Performed by patient: Thread/unthread right underwear leg, Thread/unthread left underwear leg, Pull underwear up/down Pants- Performed by patient: Thread/unthread right pants leg, Pull pants up/down Pants- Performed by helper: Thread/unthread left pants leg Non-skid slipper socks- Performed by patient: Don/doff right sock, Don/doff left sock Non-skid slipper socks- Performed by helper: Don/doff right sock, Don/doff left sock Shoes - Performed by patient: Don/doff right shoe, Don/doff left shoe, Fasten right, Fasten left TED Hose - Performed by helper: Don/doff right TED hose, Don/doff left TED hose Assist for footwear: Supervision/touching assist Assist for lower body dressing: Touching or steadying assistance (Pt > 75%)  Function - Toileting Toileting steps completed by patient: Adjust clothing prior to toileting, Performs perineal hygiene, Adjust clothing after toileting Toileting steps completed by helper: Adjust clothing prior to toileting, Performs perineal hygiene, Adjust clothing after toileting Toileting Assistive Devices: Grab bar or rail Assist level: Supervision or verbal cues  Function - Air cabin crew transfer assistive device: Grab bar, Walker Assist level to toilet: Supervision or verbal cues Assist level from toilet: Supervision or verbal cues Assist level to bedside commode (at bedside): Touching or steadying assistance (Pt > 75%) Assist level from bedside commode (at bedside): Moderate assist (Pt 50 - 74%/lift or lower)(per Elby Beck, NT)  Function - Chair/bed transfer Chair/bed transfer method: Ambulatory Chair/bed transfer assist level: Touching or steadying assistance (Pt > 75%) Chair/bed transfer assistive device: Armrests Chair/bed transfer details: Verbal cues for sequencing, Verbal cues for technique  Function - Locomotion: Wheelchair Will patient use  wheelchair at discharge?: No Type: Manual Max wheelchair distance: 75 Assist Level: Supervision or verbal cues Assist Level: Supervision or verbal cues Wheel 150 feet activity did not occur: Safety/medical concerns(fatigue) Turns around,maneuvers to table,bed, and toilet,negotiates 3% grade,maneuvers on rugs and over doorsills: No Function - Locomotion: Ambulation Assistive device: No device Max distance: 150' Assist level: Touching or steadying assistance (Pt > 75%) Assist level: Touching or steadying assistance (Pt > 75%) Assist level: Touching or steadying assistance (Pt > 75%) Walk 150 feet activity did not occur: Safety/medical concerns(fatigue) Assist level: Touching or steadying assistance (Pt > 75%) Walk 10 feet on uneven surfaces activity did not occur: Safety/medical concerns Assist level: Supervision or verbal cues  Function - Comprehension Comprehension: Auditory Comprehension assist level: Understands complex 90% of the time/cues 10% of the time  Function - Expression Expression: Verbal Expression assist level: Expresses complex 90% of the time/cues < 10% of the time  Function - Social Interaction Social Interaction assist level: Interacts appropriately 90% of the time - Needs monitoring or encouragement for participation or interaction.  Function - Problem Solving Problem solving assist level: Solves complex 90% of the time/cues < 10% of the time  Function - Memory Memory assist level: Recognizes or recalls 75 - 89% of the time/requires cueing 10 - 24% of the  time Patient normally able to recall (first 3 days only): Current season, Location of own room, Staff names and faces, That he or she is in a hospital  Medical Problem List and Plan: 1.  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  DVT Prophylaxis/Anticoagulation: Subcutaneous heparin 5000U TID, no Lovenox due to Cr Cl 3. Pain Management: Tylenol as needed 4. Mood: Remeron 15 mg nightly, Effexor or 75 mg every other day, Ativan 0.4 mg 4 times daily, Abilify 5 mg daily Dr Sima Matas to follow- some lability reported when challanged per SLP- focus on functional activities 5. Neuropsych: This patient is capable of making decisions on her own behalf. 6. Skin/Wound Care: Routine skin checks 7. Fluids/Electrolytes/Nutrition: Routine in and outs 8.  Diabetes mellitus.  Hemoglobin A1c 8.6.    Check blood sugars before meals and at bedtime CBG (last 3)  Recent Labs    04/09/18 1624 04/09/18 2148 04/10/18 0623  GLUCAP 144* 196* 155*   Home dose NovoLog 70/30 10 units twice daily increased to 14U on 9/13  Overall in hospital desired range 140-180 9.  History of tachycardia with ablation 2007.  No chest pain or shortness of breath. 10.  CKD stage III.  Baseline creatinine 1.95.    Creatinine 1.91 on 9/4  Labs pnd 11.  History of breast cancer with lumpectomy.  Follow-up outpatient 12.  Hyperlipidemia.  Lipitor- 80mg   13. Elevated blood pressure reading controlled on 9/16  LOS (Days) 13 A FACE TO FACE EVALUATION WAS PERFORMED  Charlett Blake 04/10/2018, 8:17 AM

## 2018-04-10 NOTE — Progress Notes (Signed)
Physical Therapy Session Note  Patient Details  Name: [name] MRN: [id number] Date of Birth: 1940/12/03  Today's Date: 04/10/2018 PT Individual Time: 1422-1531 PT Individual Time Calculation (min): 69 min   Short Term Goals: Week 2:  PT Short Term Goal 1 (Week 2): STG = LTG due to estimated d/c date.  Skilled Therapeutic Interventions/Progress Updates:  Pt received in w/c & agreeable to tx, denying c/o pain. Pt ambulates around unit with RW & supervision. Pt negotiates platform step (6") x 4 times with RW & steady assist<>close supervision with cuing to stand still when transitioning RW up/down steps and min cuing for compensatory technique. Pt completed Berg Balance Test & scored [phone]; educated pt on interpretation of score & current fall risk (recommendations of using RW at all times & supervision upon d/c). Patient demonstrates increased fall risk as noted by score of 43/56 on Berg Balance Scale.  (<36= high risk for falls, close to 100%; 37-45 significant >80%; 46-51 moderate >50%; 52-55 lower >25%). Pt performed standing step taps (3") without BUE support & min assist except required max assist on 2 occasions 2/2 LOB, with task focusing on weight shifting L<>R, coordination & dynamic balance. Therapist performed BLE MMT & sensation testing. Pt utilized dynavision while standing on foam with normal and narrow BOS with close supervision<>min assist with task focusing on L attention, standing balance, & forced use of LUE for NMR. Pt with slightly decreased reaction time to L side of board (0.11-.90 seconds). Pt utilized nu-step on level 3 x 4 minutes with all four extremities with task focusing on endurance training & pt reporting fatigue & inability to continue after 4 minutes. Pt returned to room & transferred to supine in bed. Pt left in bed with alarm set & all needs in reach.   Pt reports fatigue throughout session & rest breaks provided PRN.   Therapy Documentation Precautions:   Precautions Precautions: Fall Precaution Comments: mild L inattention Restrictions Weight Bearing Restrictions: No  Balance: Balance Balance Assessed: Yes Standardized Balance Assessment Standardized Balance Assessment: Berg Balance Test Berg Balance Test Sit to Stand: Able to stand without using hands and stabilize independently Standing Unsupported: Able to stand 2 minutes with supervision Sitting with Back Unsupported but Feet Supported on Floor or Stool: Able to sit safely and securely 2 minutes Stand to Sit: Sits safely with minimal use of hands Transfers: Able to transfer safely, minor use of hands Standing Unsupported with Eyes Closed: Able to stand 10 seconds with supervision Standing Ubsupported with Feet Together: Able to place feet together independently and stand for 1 minute with supervision From Standing, Reach Forward with Outstretched Arm: Can reach confidently >25 cm (10") From Standing Position, Pick up Object from Floor: Able to pick up shoe safely and easily From Standing Position, Turn to Look Behind Over each Shoulder: Looks behind one side only/other side shows less weight shift(decreased weight shift when turning to look over L shoulder) Turn 360 Degrees: Needs close supervision or verbal cueing Standing Unsupported, Alternately Place Feet on Step/Stool: Able to complete >2 steps/needs minimal assist Standing Unsupported, One Foot in Front: Able to plae foot ahead of the other independently and hold 30 seconds Standing on One Leg: Able to lift leg independently and hold equal to or more than 3 seconds Total Score: 43   See Function Navigator for Current Functional Status.   Therapy/Group: Individual Therapy  Waunita Schooner 04/10/2018, 3:35 PM

## 2018-04-10 NOTE — Progress Notes (Addendum)
Occupational Therapy Session Note  Patient Details  Name: [name] MRN: [id number] Date of Birth: [birth year]  Today's Date: 04/10/2018 OT Individual Time: 1300-1400 OT Individual Time Calculation (min): 60 min    Short Term Goals: Week 2:  OT Short Term Goal 1 (Week 2): STG = LTGs due to remaining LOS  Skilled Therapeutic Interventions/Progress Updates:    Pt presents sitting up in w/c with no c/o pain, agreeable to OT tx session. Pt declining ADL completion. Ambulated throughout session using RW with supervision. In therapy gym Pt engaged in standing reaching activity using LUE to place clothes pins on basketball net with RUE support on RW. Pt requiring increased time/effort for accurately placing pins. Increased dynamic challenge having pt stand on blue foam wedge when removing clothespins, pt initially requiring minA for completion progressed to minguard. Ambulated to kitchen where pt engaged in iADL simple meal prep task (making scrambled eggs). Pt completing task with overall CGA and min cues for safety. Further discussion held regarding strategies to increase safety at home during meal prep tasks with pt verbalizing understanding. Pt returned to room in manner described above where pt was left seated in w/c, call bell and needs within reach.   Therapy Documentation Precautions:  Precautions Precautions: Fall Precaution Comments: mild L inattention Restrictions Weight Bearing Restrictions: No  Pain: Pain Assessment Pain Score: 0-No pain ADL:   Vision Baseline Vision/History: Wears glasses;Macular Degeneration(R eye) Wears Glasses: Reading only Patient Visual Report: No change from baseline  See Function Navigator for Current Functional Status.   Therapy/Group: Individual Therapy  Raymondo Band 04/10/2018, 4:05 PM

## 2018-04-10 NOTE — Progress Notes (Signed)
Speech Language Pathology Daily Session Note  Patient Details  Name: [name] MRN: [id number] Date of Birth: 1940-09-10  Today's Date: 04/10/2018 SLP Individual Time: 0804-0900 SLP Individual Time Calculation (min): 56 min  Short Term Goals: Week 2: SLP Short Term Goal 1 (Week 2): Pt will demonstrate semi-problem solving during functional tasks with supervision cues.  SLP Short Term Goal 2 (Week 2): Pt will demonstrate self-monitoring and correcting of functional errors during problem solving tasks with supervision A verbal and question cues. SLP Short Term Goal 3 (Week 2): Pt will demonstrate selective attention in moderately distracting environment for ~ 45 minutes with supervision cues.  SLP Short Term Goal 4 (Week 2): Pt will demonstrate anticipatory awareness by listing safe vs. unsafe tasks with supervision cues.   Skilled Therapeutic Interventions:Skilled ST services focused on cognitive skills. SLP facilitated functional problem solving skill, reviewing physical home exercises and pt return demonstrating requiring supervision A verbal cues for leading questions pertaining to safety during implementing exercises. SLP facilitated safety precautions requesting Ted hose piror to walking with walker and alternating attention while placing meal order during ambulation with supervision A. SLP facilitated anticipatory awareness and semi-complex problem solving, creating a list of activities/assistance for aid, who is providing supervision 12-3pm three times a week, daughter in-law will cover x2 days, creating intermittent supervision. SLP discussed recommendation for full supervision A, however pt stated "whatever my son thinks is best." Pt was left in room with call bell within reach. Recommend to continue skilled ST services.       Function:  Eating Eating     Eating Assist Level: Set up assist for   Eating Set Up Assist For: Opening containers;Cutting food        Cognition Comprehension Comprehension assist level: Understands complex 90% of the time/cues 10% of the time  Expression   Expression assist level: Expresses complex 90% of the time/cues < 10% of the time  Social Interaction Social Interaction assist level: Interacts appropriately 90% of the time - Needs monitoring or encouragement for participation or interaction.  Problem Solving Problem solving assist level: Solves complex 90% of the time/cues < 10% of the time  Memory Memory assist level: Recognizes or recalls 75 - 89% of the time/requires cueing 10 - 24% of the time    Pain Pain Assessment Pain Score: 0-No pain  Therapy/Group: Individual Therapy  [name]  [facility] 04/10/2018, 2:11 PM

## 2018-04-10 NOTE — Progress Notes (Addendum)
Physical Therapy Discharge Summary  Patient Details  Name: [name] MRN: [id number] Date of Birth: 1940-10-01  Today's Date: 04/11/2018    Patient has met 12 of 12 long term goals due to improved activity tolerance, improved balance, improved postural control, increased strength, ability to compensate for deficits, improved attention, improved awareness and improved coordination.  Patient to discharge at an ambulatory level supervision with RW.  Recommending 24 hr supervision at d/c.  Reasons goals not met: n/a  Recommendation:  Patient will benefit from ongoing skilled PT services in home health setting to continue to advance safe functional mobility, address ongoing impairments in mild L inattention, dynamic balance, endurance, progress gait with LRAD, stair negotiation, safety awareness & memory/recall, and minimize fall risk.  Equipment: RW  Reasons for discharge: treatment goals met and discharge from hospital  Patient/family agrees with progress made and goals achieved: Yes  PT Discharge Precautions/Restrictions Precautions Precautions: Fall Precaution Comments: mild L inattention Restrictions Weight Bearing Restrictions: No  Vision/Perception  Pt wears glasses at baseline for reading only. 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   Cognition Overall Cognitive Status: Impaired/Different from baseline Arousal/Alertness: Awake/alert Orientation Level: Oriented X4 Memory: Impaired Memory Impairment: Decreased recall of new information Awareness: Impaired Awareness Impairment: Emergent impairment;Anticipatory impairment Problem Solving: Impaired Problem Solving Impairment: Functional complex Safety/Judgment: Impaired  Sensation Sensation Light Touch: Appears Intact(BLE) Coordination Gross Motor Movements are Fluid and Coordinated: No   Motor  Motor Motor: Hemiplegia;Abnormal postural alignment and control Motor - Discharge Observations: L hemi,  generalized deconditioning   Mobility Bed Mobility Bed Mobility: Rolling Right;Supine to Sit;Rolling Left;Sit to Supine Rolling Right: Independent with assistive device Rolling Left: Independent with assistive device Supine to Sit: Independent with assistive device Sit to Supine: Independent with assistive device Transfers Transfers: Sit to Stand Sit to Stand: Independent with assistive device  Locomotion  Gait Ambulation: Yes Gait Assistance: Supervision/Verbal cueing Gait Distance (Feet): (>150 ft ) Assistive device: Rolling walker Gait Assistance Details: (verbal cuing for increased heel strike & dorsiflexion LLE) Gait Gait: Yes Gait Pattern: Impaired Gait Pattern: (decreased dorsiflexion & heel strike LLE, decresaed weight shifting) Gait velocity: decreased Stairs / Additional Locomotion Stairs: Yes Stairs Assistance: Supervision/Verbal cueing Stair Management Technique: One rail Left;Sideways Number of Stairs: 12 Height of Stairs: 6(inches) Ramp: Supervision/Verbal cueing(ambulatory with RW) Curb: Supervision/Verbal cueing(with RW) Wheelchair Mobility Wheelchair Mobility: No   Trunk/Postural Assessment  Thoracic Assessment Thoracic Assessment: Exceptions to WFL(rounded shoulders) Lumbar Assessment Lumbar Assessment: Exceptions to WFL(posterior pelvic tilt) Postural Control Postural Control: Deficits on evaluation(impaired hip/stepping strategies) Righting Reactions: impaired Protective Responses: impaired   Balance Balance Balance Assessed: Yes Standardized Balance Assessment Standardized Balance Assessment: Berg Balance Test Berg Balance Test Sit to Stand: Able to stand without using hands and stabilize independently Standing Unsupported: Able to stand 2 minutes with supervision Sitting with Back Unsupported but Feet Supported on Floor or Stool: Able to sit safely and securely 2 minutes Stand to Sit: Sits safely with minimal use of hands Transfers: Able  to transfer safely, minor use of hands Standing Unsupported with Eyes Closed: Able to stand 10 seconds with supervision Standing Ubsupported with Feet Together: Able to place feet together independently and stand for 1 minute with supervision From Standing, Reach Forward with Outstretched Arm: Can reach confidently >25 cm (10") From Standing Position, Pick up Object from Floor: Able to pick up shoe safely and easily From Standing Position, Turn to Look Behind Over each Shoulder: Looks behind one side only/other side shows less  weight shift(decreased weight shift when turning to look over L shoulder) Turn 360 Degrees: Needs close supervision or verbal cueing Standing Unsupported, Alternately Place Feet on Step/Stool: Able to complete >2 steps/needs minimal assist Standing Unsupported, One Foot in Front: Able to plae foot ahead of the other independently and hold 30 seconds Standing on One Leg: Able to lift leg independently and hold equal to or more than 3 seconds Total Score: 43  Extremity Assessment  BUE strength & ROM WFL RLE grossly 5/5, ROM WFL LLE grossly 4/5, ROM WFL  See Function Navigator for Current Functional Status.  Waunita Schooner, PT, DPT 04/11/2018, 7:34 AM   Ares Tegtmeyer Delmer Islam, PT, DPT 04/11/2018, 4:28 PM

## 2018-04-10 NOTE — Consult Note (Signed)
Neuropsychological Consultation   Patient:   [name]   DOB:   06/08/1941  MR Number:  [id number]  Location:  [facility] [id number] [place] [address] Dept: [place]: [phone]           Date of Service:   04/10/2018  Start Time:   9 AM End Time:   10 AM  Provider/Observer:  Ilean Skill, Psy.D.       Clinical Neuropsychologist       Billing Code/Service: [id number] 4 units  Chief Complaint:    Rise Traeger is a 77 year old female with a history of tachycardia status post ablation 2007, diabetes, right breast cancer status post lumpectomy as well as colon cancer, chronic kidney disease stage III.  The patient presented on 03/24/2018 with left-sided weakness and facial droop as well as slurred speech.  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  Patient does have a prior history of depression and anxiety.  Reason for Service:  The patient was referred for neuropsychological consult due to coping and anxiety issues.  Below is the HPI for the current admission.  HPI: [name] is a 77 year old right-handed female with history of tachycardia status post ablation 2007, diabetes mellitus, right breast cancer status post lumpectomy as well as colon cancer, CKD stage III.  Per chart review patient lives alone was independent prior to admission and driving.  Family does provide Doctor, hospital.  One level town home.  Son is a Field seismologist.  Presented 03/24/2018 with left-sided weakness and facial droop as well as slurred speech.  Cranial CT scan negative for acute changes.  Patient did receive TPA.  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  No significant proximal stenosis aneurysm or branch vessel occlusion.  Echocardiogram with ejection fraction of 84% grade 1 diastolic dysfunction.  Carotid Dopplers with  no ICA stenosis.  Neurology consulted presently on aspirin and Plavix for CVA prophylaxis.  Subcutaneous heparin for DVT prophylaxis.  Patient is tolerating a regular diet.  Physical and occupational therapy evaluations completed with recommendations of physical medicine rehab consult.  Patient was admitted for a comprehensive rehab program  Current Status:  Oliviya Gilkison reports that anxiety has significantly improved over the past week.  The patient reports that she has seen significant improvement in her motor functioning for the left side of her body which is greatly improved her outlook about what she will be able to do what she goes home.  She is looking forward to discharge.  The patient has good support and reports that any depression or anxiety have significantly improved.  Behavioral Observation: Lorenza Shakir  presents as a 76 y.o.-year-old Right Caucasian Female who appeared her stated age. her dress was Appropriate and she was Well Groomed and her manners were Appropriate to the situation.  her participation was indicative of Appropriate and Attentive behaviors.  There were any physical disabilities noted.  she displayed an appropriate level of cooperation and motivation.     Interactions:    Active Appropriate and Attentive  Attention:   within normal limits and attention span and concentration were age appropriate  Memory:   within normal limits; recent and remote memory intact  Visuo-spatial:  not examined  Speech (Volume):  normal  Speech:   normal; normal  Thought Process:  Coherent and Relevant  Though Content:  WNL; not suicidal and not homicidal  Orientation:  person, place, time/date and situation  Judgment:   Good  Planning:   Good  Affect:    Anxious  Mood:    Anxious  Insight:   Good  Intelligence:   high  Medical History:   Past Medical History:  Diagnosis Date  . Anxiety   . Back ache   . Breast cancer ([place])    right  . Bronchitis   . Cancer  of colon ([place])   . Depression   . DM (diabetes mellitus) ([place])   . Dyspnea on exertion   . Fall on steps    age 52 feel down iron stairs couldnot move leg for 6 mths  . Family history of primary TB   . Fracture of ankle, closed 2014   left  . History of chicken pox   . History of hysterectomy 04/1980  . History of primary TB   . Hyperlipidemia   . Hypertension   . Kidney disease, chronic, stage III (moderate, EGFR 30-59 ml/min) ([place]) 2017  . Kidney disorder    reduction function  . Kidney stones   . Knee pain, right   . Lung abnormality    age 89 patient had pna spot on lungs   . Microalbuminuria   . Muscle cramps    both legs  . Muscular degeneration    of right eye  . Nephrolithiasis   . Palpitations   . Personal history of radiation therapy    Psychiatric History:  Patient has prior history of depression and anxieity.    Family Med/Psych History:  Family History  Problem Relation Age of Onset  . Peripheral Artery Disease Mother 42       HX OF POOR CIRCULATION, SMOKING, LEG AMPUTATION  . Cancer Father        cancer of spine  . Asthma Brother 4  . Kidney Stones Brother   . Breast cancer Neg Hx     Risk of Suicide/Violence: virtually non-existent Patient denies SI or HI.  Impression/DX:  [name] is a 77 year old female with a history of tachycardia status post ablation 2007, diabetes, right breast cancer status post lumpectomy as well as colon cancer, chronic kidney disease stage III.  The patient presented on 03/24/2018 with left-sided weakness and facial droop as well as slurred speech.  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  Patient does have a prior history of depression and anxiety. Ernest Basic does report that anxiety has be persistent but improved over past few days as she is able to see her motor improvements.  She denies significant depression currently.  Lannette Maue reports that anxiety has significantly improved  over the past week.  The patient reports that she has seen significant improvement in her motor functioning for the left side of her body which is greatly improved her outlook about what she will be able to do what she goes home.  She is looking forward to discharge.  The patient has good support and reports that any depression or anxiety have significantly improved.        Electronically Signed   _______________________ Ilean Skill, Psy.D.

## 2018-04-11 ENCOUNTER — Inpatient Hospital Stay (HOSPITAL_COMMUNITY): Payer: Medicare Other

## 2018-04-11 ENCOUNTER — Inpatient Hospital Stay (HOSPITAL_COMMUNITY): Payer: Medicare Other | Admitting: Physical Therapy

## 2018-04-11 ENCOUNTER — Inpatient Hospital Stay (HOSPITAL_COMMUNITY): Payer: Medicare Other | Admitting: Occupational Therapy

## 2018-04-11 LAB — GLUCOSE, CAPILLARY
Glucose-Capillary: 161 mg/dL — ABNORMAL HIGH (ref 70–99)
Glucose-Capillary: 157 mg/dL — ABNORMAL HIGH (ref 70–99)
Glucose-Capillary: 157 mg/dL — ABNORMAL HIGH (ref 70–99)
Glucose-Capillary: 186 mg/dL — ABNORMAL HIGH (ref 70–99)

## 2018-04-11 NOTE — Progress Notes (Signed)
Speech Language Pathology Discharge Summary  Patient Details  Name: [name] MRN: [id number] Date of Birth: Feb 17, 1941  Today's Date: 04/11/2018 SLP Individual Time: 1000-1100 SLP Individual Time Calculation (min): 60 min   Skilled Therapeutic Interventions:   Skilled ST services focused on cognitive skills and eductaion. SLP facilitated reassessment fo cognitive lingutisic skills given MOCA version 7.1 scoring 24 out 30, two point increase from initial evaluation (22/30) with (n=>26.)  SLP facilitated safety awareness skill given picture scenes pt demonstrated ability to identify, and providing solutions with sueprvsion A verbal cues. SLP provided education for need for continued supervision A for cognitive skills and follow up skilled ST services, pt stated agreement.  Pt was left in room with call bell within reach. ST reccomends to continue skilled ST services    Patient has met 4 of 4 long term goals.  Patient to discharge at overall Supervision level.  Reasons goals not met:     Clinical Impression/Discharge Summary: Pt made great progress meeting 4 out 4 goals, discharging at supervision A for cognitive goals and Mod I for use of speech intelligibility strategies. Pt demonstrated increase in functional semi-complex problem solving skills, error awareness, anticipatory awareness and selective attention. Pt reports continued slower rate of speech and cognitive processing piror to CVA, however demonstrates supervision A-Mod I level.  Pt demonstrated increase in cognitive linguistic skills supported by improvement on formal assessment MOCA from 22/30 on initial evaluation to 24/30 upon discharge (n=>26.) SLP recommends supervision at discharge due to cognitive deficits and continuing skilled ST services with home health at discharge.   Care Partner:  Caregiver Able to Provide Assistance: Yes  Type of Caregiver Assistance: Physical;Cognitive  Recommendation:  Home Health SLP  Rationale  for SLP Follow Up: Maximize cognitive function and independence;Reduce caregiver burden   Equipment:     Reasons for discharge: Discharged from hospital   Patient/Family Agrees with Progress Made and Goals Achieved: Yes   Function:  Eating Eating     Eating Assist Level: Set up assist for   Eating Set Up Assist For: Opening containers;Cutting food       Cognition Comprehension Comprehension assist level: Understands complex 90% of the time/cues 10% of the time  Expression   Expression assist level: Expresses complex 90% of the time/cues < 10% of the time  Social Interaction Social Interaction assist level: Interacts appropriately 90% of the time - Needs monitoring or encouragement for participation or interaction.  Problem Solving Problem solving assist level: Solves complex 90% of the time/cues < 10% of the time  Memory Memory assist level: Recognizes or recalls 75 - 89% of the time/requires cueing 10 - 24% of the time   Reznor Ferrando  [facility] 04/11/2018, 11:54 AM

## 2018-04-11 NOTE — Progress Notes (Signed)
Physical Therapy Session Note  Patient Details  Name: [name] MRN: [id number] Date of Birth: 03-Mar-1941  Today's Date: 04/11/2018 PT Individual Time: 9093-1121 PT Individual Time Calculation (min): 30 min   Short Term Goals: Week 2:  PT Short Term Goal 1 (Week 2): STG = LTG due to estimated d/c date.  Skilled Therapeutic Interventions/Progress Updates:   Pt in supine and agreeable to therapy, denies pain. Session focused on overall endurance and aerobic conditioning. Ambulated around unit w/ RW, supervision w/ no verbal cues needed for safety. Performed NuStep 5 min x2 @ level 3 for LE strengthening. Educated pt on stroke risk factors and stroke prevention. Provided w/ booklet on these topics as pt is very fearful of having another stroke. Returned to room and ended session in w/c, call bell within reach and all needs met.    Therapy Documentation Precautions:  Precautions Precautions: Fall Precaution Comments: mild L inattention Restrictions Weight Bearing Restrictions: No Vital Signs: Therapy Vitals Pulse Rate: 92 Resp: 20 BP: 132/80 Patient Position (if appropriate): Sitting Oxygen Therapy SpO2: 100 % O2 Device: Room Air  See Function Navigator for Current Functional Status.   Therapy/Group: Individual Therapy  [name] 04/11/2018, 4:08 PM

## 2018-04-11 NOTE — Discharge Summary (Signed)
[name], [name] MEDICAL RECORD XY:[id number] ACCOUNT [id number] DATE OF BIRTH:1941-05-30 FACILITY: [facility] LOCATION: [facility] PHYSICIAN:ANDREW Letta Pate, MD  DISCHARGE SUMMARY  DATE OF DISCHARGE:  04/12/2018  ADMIT DATE:  03/28/2018  DISCHARGE DATE:  04/12/2018  DISCHARGE DIAGNOSES: 1.  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   2.  Subcutaneous heparin for deep vein thrombosis prophylaxis.   3.  Mood. 4.  Diabetes mellitus. 5.  Peripheral neuropathy.   6.  History of tachycardia with ablation in 2007. 7.  Chronic kidney disease, stage III. 8.  History of breast cancer with lumpectomy. 9.  Hyperlipidemia. 10.  Hypertension.  HOSPITAL COURSE:  A 77 year old right-handed female with history of tachycardia, status post ablation in 2007, diabetes mellitus, CKD stage III.  Lives alone, independent prior to admission.  Family provides Doctor, hospital.  A 1-level home.   Presented 03/24/2018 with left-sided weakness, facial droop, slurred speech.  Cranial CT scan negative.  The patient did receive tPA.  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  No significant proximal  stenosis, aneurysm or branch vessel occlusion.  Echocardiogram with ejection fraction of 48%, grade I diastolic dysfunction.  Carotid Dopplers negative for ICA stenosis.  Neurology consulted.  Placed on aspirin and Plavix therapy.  Subcutaneous heparin  for DVT prophylaxis.  The patient was admitted for comprehensive rehabilitation program.  PAST MEDICAL HISTORY:  See discharge diagnoses.  SOCIAL HISTORY:  Lives alone, independent prior to admission.  Local family with good support.  FUNCTIONAL STATUS:  Upon admission to rehab services was minimal assist 77 feet rolling walker, minimal assist sit to stand, min mod assist with activities of daily living.  PHYSICAL EXAMINATION: VITAL SIGNS:  Blood pressure 124/63, pulse 81, temperature  98, respirations 18. GENERAL:  Alert female in no acute distress.  Oriented x3.  Fair awareness of deficits. HEENT:  EOMs intact. NECK:  Supple, nontender, no JVD. CARDIOVASCULAR:  Rate controlled. ABDOMEN:  Soft, nontender, good bowel sounds. LUNGS:  Clear to auscultation.  Speech mildly dysarthric but fully intelligible.  REHABILITATION HOSPITAL COURSE:  The patient was admitted to inpatient rehabilitation services.  Therapies initiated on a 3-hour daily basis, consisting of physical therapy, occupational therapy, speech therapy and rehabilitation nursing.  The following  issues were addressed during patient's rehabilitation stay.  Pertaining to the patient's right paramedian pontine infarction, remained stable.  She would follow up with neurology services.  She remained on Plavix and aspirin therapy through 04/14/2018  then Plavix alone.  Subcutaneous heparin for DVT prophylaxis during her hospital course.  No bleeding episodes.  Mood stabilization with Remeron, Effexor as well as Abilify.  She was attending full therapies and very cooperative.  She was very motivated  for her therapies.  Blood sugars controlled.  Hemoglobin A1c of 8.6.  Insulin therapy as directed.  Her Glucophage was held due to mild elevation in creatinine.  Full diabetic teaching.  No chest pain or shortness of breath.  CKD stage III, baseline creatinine 1.95.  Patient was normotensive off blood pressure meds she had been on lisinopril and Norvasc prior to admission.  She did have a history of breast cancer and  lumpectomy follow up outpatient as directed.  She remained on Lipitor for hyperlipidemia.  The patient received weekly collaborative interdisciplinary team conferences to discuss estimated length of stay, family teaching, any barriers to discharge.  She can ambulate throughout the unit rolling walker, supervision.  Negotiates platform steps  times four 6 inch stairs with  a rolling walker, steady assist to close  supervision, up and down stairs minimal cueing.  Berg balance testing 43/56.  She can gather her belongings for activities of daily living and homemaking.  It was addressed no  driving.  She was discharged to home after family teaching completed.  DISCHARGE MEDICATIONS:  Included Abilify 5 mg p.o. daily, aspirin 325 mg p.o. daily, Lipitor 80 mg p.o. daily, Plavix 75 mg p.o. daily, NovoLog 70/30, 14 units b.i.d., Ativan 0.5 mg p.o. q.i.d., Remeron 15 mg p.o. at bedtime, Senokot-S 2 tablets p.o.  b.i.d., Effexor 75 mg every other day, Tylenol as needed.  DIET:  Her diet was a diabetic diet.    FOLLOWUP:  She would follow up with Dr. Alysia Penna at the outpatient rehab center as directed; Dr. Antony Contras, call for appointment; Dr. Nelva Bush cardiology service, call for appointment; Dr. Crist Infante, medical management.  SPECIAL INSTRUCTIONS:  No driving.  The patient should continue aspirin and Plavix therapy through 04/14/2018 then Plavix alone.  TN/NUANCE D:04/11/2018 T:04/11/2018 JOB:[id number]

## 2018-04-11 NOTE — Progress Notes (Signed)
Subjective/Complaints:  No new issues overnite , practiced car transfers, we discussed no driving  ROS: denies CP, SOB, N/V/D  Objective: Vital Signs: Blood pressure 123/71, pulse 89, temperature 98.2 F (36.8 C), resp. rate 18, height 5' 7"  (1.702 m), weight 85 kg, SpO2 99 %. No results found. Results for orders placed or performed during the hospital encounter of 03/28/18 (from the past 72 hour(s))  Glucose, capillary     Status: Abnormal   Collection Time: 04/08/18 11:35 AM  Result Value Ref Range   Glucose-Capillary 122 (H) 70 - 99 mg/dL  Glucose, capillary     Status: Abnormal   Collection Time: 04/08/18  4:37 PM  Result Value Ref Range   Glucose-Capillary 197 (H) 70 - 99 mg/dL  Glucose, capillary     Status: Abnormal   Collection Time: 04/08/18  9:32 PM  Result Value Ref Range   Glucose-Capillary 161 (H) 70 - 99 mg/dL  Glucose, capillary     Status: Abnormal   Collection Time: 04/09/18  6:27 AM  Result Value Ref Range   Glucose-Capillary 130 (H) 70 - 99 mg/dL  Glucose, capillary     Status: Abnormal   Collection Time: 04/09/18 12:08 PM  Result Value Ref Range   Glucose-Capillary 207 (H) 70 - 99 mg/dL  Glucose, capillary     Status: Abnormal   Collection Time: 04/09/18  4:24 PM  Result Value Ref Range   Glucose-Capillary 144 (H) 70 - 99 mg/dL  Glucose, capillary     Status: Abnormal   Collection Time: 04/09/18  9:48 PM  Result Value Ref Range   Glucose-Capillary 196 (H) 70 - 99 mg/dL   Comment 1 Notify RN   Glucose, capillary     Status: Abnormal   Collection Time: 04/10/18  6:23 AM  Result Value Ref Range   Glucose-Capillary 155 (H) 70 - 99 mg/dL   Comment 1 Notify RN   Basic metabolic panel     Status: Abnormal   Collection Time: 04/10/18 10:45 AM  Result Value Ref Range   Sodium 139 135 - 145 mmol/L   Potassium 4.5 3.5 - 5.1 mmol/L   Chloride 106 98 - 111 mmol/L   CO2 23 22 - 32 mmol/L   Glucose, Bld 214 (H) 70 - 99 mg/dL   BUN 37 (H) 8 - 23 mg/dL   Creatinine, Ser 1.97 (H) 0.44 - 1.00 mg/dL   Calcium 9.8 8.9 - 10.3 mg/dL   GFR calc non Af Amer 23 (L) >60 mL/min   GFR calc Af Amer 27 (L) >60 mL/min    Comment: (NOTE) The eGFR has been calculated using the CKD EPI equation. This calculation has not been validated in all clinical situations. eGFR's persistently <60 mL/min signify possible Chronic Kidney Disease.    Anion gap 10 5 - 15    Comment: Performed at [facility] [address] [place], [place] [address]  Glucose, capillary     Status: Abnormal   Collection Time: 04/10/18 11:45 AM  Result Value Ref Range   Glucose-Capillary 162 (H) 70 - 99 mg/dL  Glucose, capillary     Status: Abnormal   Collection Time: 04/10/18  4:38 PM  Result Value Ref Range   Glucose-Capillary 131 (H) 70 - 99 mg/dL  Glucose, capillary     Status: Abnormal   Collection Time: 04/10/18  9:37 PM  Result Value Ref Range   Glucose-Capillary 115 (H) 70 - 99 mg/dL  Glucose, capillary     Status:  Abnormal   Collection Time: 04/11/18  6:43 AM  Result Value Ref Range   Glucose-Capillary 161 (H) 70 - 99 mg/dL     Constitutional: No distress . Vital signs reviewed. HENT: Normocephalic.  Atraumatic. Eyes: EOMI. No discharge. Cardiovascular: RRR. No JVD. Respiratory: CTA bilaterally. Normal effort. GI: BS +. Non-distended. Musc: No edema or tenderness in extremities. Skin:   Warm and dry. Intact. Neuro: Alert/Oriented,  Motor: 4--4/5 LUE proximal to distal 4-/5 proximal to distal LLE 4+/5 RUE proximal to distal  Assessment/Plan: 1. 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 Physiatrist is providing close team supervision and 24 hour management of active medical problems listed below. Physiatrist and rehab team continue to assess barriers to discharge/monitor patient progress toward functional and  medical goals. FIM: Function - Bathing Position: Shower Body parts bathed by patient: Right arm, Left arm, Chest, Abdomen, Front perineal area, Right upper leg, Left upper leg, Buttocks, Right lower leg, Left lower leg Body parts bathed by helper: Back Assist Level: Touching or steadying assistance(Pt > 75%)  Function- Upper Body Dressing/Undressing What is the patient wearing?: Pull over shirt/dress Pull over shirt/dress - Perfomed by patient: Thread/unthread right sleeve, Thread/unthread left sleeve, Put head through opening, Pull shirt over trunk Pull over shirt/dress - Perfomed by helper: Pull shirt over trunk Assist Level: Set up Set up : To obtain clothing/put away Function - Lower Body Dressing/Undressing What is the patient wearing?: Underwear, Pants, Non-skid slipper socks Position: Sitting EOB Underwear - Performed by patient: Thread/unthread right underwear leg, Thread/unthread left underwear leg, Pull underwear up/down Pants- Performed by patient: Thread/unthread right pants leg, Pull pants up/down Pants- Performed by helper: Thread/unthread left pants leg Non-skid slipper socks- Performed by patient: Don/doff right sock, Don/doff left sock Non-skid slipper socks- Performed by helper: Don/doff right sock, Don/doff left sock Shoes - Performed by patient: Don/doff right shoe, Don/doff left shoe, Fasten right, Fasten left TED Hose - Performed by helper: Don/doff right TED hose, Don/doff left TED hose Assist for footwear: Supervision/touching assist Assist for lower body dressing: Touching or steadying assistance (Pt > 75%)  Function - Toileting Toileting steps completed by patient: Adjust clothing prior to toileting, Performs perineal hygiene Toileting steps completed by helper: Adjust clothing prior to toileting, Performs perineal hygiene, Adjust clothing after toileting Toileting Assistive Devices: Grab bar or rail Assist level: Supervision or verbal cues  Function -  Air cabin crew transfer assistive device: Grab bar, Walker Assist level to toilet: Supervision or verbal cues Assist level from toilet: Supervision or verbal cues Assist level to bedside commode (at bedside): Touching or steadying assistance (Pt > 75%) Assist level from bedside commode (at bedside): Moderate assist (Pt 50 - 74%/lift or lower)(per Elby Beck, NT)  Function - Chair/bed transfer Chair/bed transfer method: Ambulatory Chair/bed transfer assist level: Supervision or verbal cues Chair/bed transfer assistive device: Walker Chair/bed transfer details: Verbal cues for sequencing, Verbal cues for technique  Function - Locomotion: Wheelchair Will patient use wheelchair at discharge?: No Type: Manual Wheelchair activity did not occur: N/A Max wheelchair distance: 75 Assist Level: Supervision or verbal cues Wheel 50 feet with 2 turns activity did not occur: N/A Assist Level: Supervision or verbal cues Wheel 150 feet activity did not occur: N/A Turns around,maneuvers to table,bed, and toilet,negotiates 3% grade,maneuvers on rugs and over doorsills: No Function - Locomotion: Ambulation Assistive device: Walker-rolling Max distance: >150 ft  Assist level:  Supervision or verbal cues Assist level: Supervision or verbal cues Assist level: Supervision or verbal cues Walk 150 feet activity did not occur: Safety/medical concerns(fatigue) Assist level: Supervision or verbal cues Walk 10 feet on uneven surfaces activity did not occur: Safety/medical concerns Assist level: Supervision or verbal cues  Function - Comprehension Comprehension: Auditory Comprehension assist level: Understands complex 90% of the time/cues 10% of the time  Function - Expression Expression: Verbal Expression assist level: Expresses complex 90% of the time/cues < 10% of the time  Function - Social Interaction Social Interaction assist level: Interacts appropriately 90% of the time - Needs monitoring  or encouragement for participation or interaction.  Function - Problem Solving Problem solving assist level: Solves complex 90% of the time/cues < 10% of the time  Function - Memory Memory assist level: Recognizes or recalls 75 - 89% of the time/requires cueing 10 - 24% of the time Patient normally able to recall (first 3 days only): Current season, Location of own room, Staff names and faces, That he or she is in a hospital  Medical Problem List and Plan: 1.  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  DVT Prophylaxis/Anticoagulation: Subcutaneous heparin 5000U TID, d/c today, ambulating >100' 3. Pain Management: Tylenol as needed 4. Mood: Remeron 15 mg nightly, Effexor or 75 mg every other day, Ativan 0.4 mg 4 times daily, Abilify 5 mg daily Dr Sima Matas to follow- some lability reported when challanged per SLP- focus on functional activities 5. Neuropsych: This patient is capable of making decisions on her own behalf. 6. Skin/Wound Care: Routine skin checks 7. Fluids/Electrolytes/Nutrition: Routine in and outs 8.  Diabetes mellitus.  Hemoglobin A1c 8.6.    Check blood sugars before meals and at bedtime CBG (last 3)  Recent Labs    04/10/18 1638 04/10/18 2137 04/11/18 0643  GLUCAP 131* 115* 161*   Home dose NovoLog 70/30 10 units twice daily increased to 14U on 9/13  Overall in hospital desired range 140-180 9.  History of tachycardia with ablation 2007.  No chest pain or shortness of breath. 10.  CKD stage III.  Baseline creatinine 1.95.    Creatinine 1.91 on 9/4  Labs pnd 11.  History of breast cancer with lumpectomy.  Follow-up outpatient 12.  Hyperlipidemia.  Lipitor- 74m  13.  Normotensive off BP meds  LOS (Days) 14 A FACE TO FACE EVALUATION WAS PERFORMED  ACharlett Blake9/17/2019, 8:36 AM

## 2018-04-11 NOTE — Progress Notes (Signed)
Occupational Therapy Discharge Summary  Patient Details  Name: [name] MRN: [id number] Date of Birth: [birth year]  Patient has met 9 of 10 long term goals due to improved activity tolerance, improved balance, ability to compensate for deficits, functional use of  LEFT upper and LEFT lower extremity, improved awareness and improved coordination.  Patient to discharge at overall supervision level with bathing at shower level and IADLs, Mod I with dressing and toilet transfers level.  Patient's care partner is independent to provide the necessary cognitive assistance at discharge.  Per pt and SWK, pt's family to hire caregivers to come out to the home to provide intermittent level supervision.  Reasons goals not met: Pt has not demonstrated anticipatory awareness, has demonstrated emergent awareness.  Recommendation:  Patient will benefit from ongoing skilled OT services in home health setting to continue to advance functional skills in the area of BADL and Reduce care partner burden.  Equipment: tub bench and 3 in 1  Reasons for discharge: treatment goals met and discharge from hospital  Patient/family agrees with progress made and goals achieved: Yes  OT Discharge Precautions/Restrictions  Precautions Precautions: Fall Precaution Comments: mild L inattention Pain Pain Assessment Pain Scale: 0-10 Pain Score: 0-No pain ADL  See Function Navigator Vision Baseline Vision/History: Wears glasses;Macular Degeneration(Rt eye) Wears Glasses: Reading only Patient Visual Report: No change from baseline Vision Assessment?: Yes Eye Alignment: Within Functional Limits Ocular Range of Motion: Within Functional Limits Alignment/Gaze Preference: Within Defined Limits Tracking/Visual Pursuits: Decreased smoothness of horizontal tracking;Decreased smoothness of vertical tracking Saccades: Within functional limits Convergence: Within functional limits Visual Fields: No apparent  deficits Perception  Comments: Mild Lt inattention during functional tasks Praxis Praxis: Intact Cognition Overall Cognitive Status: Impaired/Different from baseline Arousal/Alertness: Awake/alert Orientation Level: Oriented X4 Attention: Selective Selective Attention: Impaired Memory: Impaired Memory Impairment: Decreased recall of new information Awareness: Impaired Awareness Impairment: Anticipatory impairment;Emergent impairment Problem Solving: Impaired Safety/Judgment: Impaired Sensation Sensation Light Touch: Appears Intact Proprioception: Appears Intact Coordination Gross Motor Movements are Fluid and Coordinated: No Fine Motor Movements are Fluid and Coordinated: No Finger Nose Finger Test: mild dysmetria LUE 9 Hole Peg Test: Rt: 33, Lt: 47 (improved from 2:53 on eval) Extremity/Trunk Assessment RUE Assessment RUE Assessment: Within Functional Limits LUE Assessment LUE Assessment: Exceptions to [facility] Active Range of Motion (AROM) Comments: shoulder flexion grossly 150*, elbow extension WFL General Strength Comments: grossly 4-/5 LUE Body System: Neuro Brunstrum levels for arm and hand: Arm;Hand Brunstrum level for arm: Stage V Relative Independence from Synergy Brunstrum level for hand: Stage V Independence from basic synergies   See Function Navigator for Current Functional Status.  Simonne Come 04/12/2018, 8:44 AM

## 2018-04-11 NOTE — Progress Notes (Signed)
Occupational Therapy Session Note  Patient Details  Name: Diella Gillingham MRN: [id number] Date of Birth: 1940/12/24  Today's Date: 04/11/2018 OT Individual Time: 0902-1000 OT Individual Time Calculation (min): 58 min    Short Term Goals: Week 2:  OT Short Term Goal 1 (Week 2): STG = LTGs due to remaining LOS  Skilled Therapeutic Interventions/Progress Updates:    Completed ADL retraining at overall supervision level for shower transfers and bathing and Mod I with basic transfers and dressing tasks.  Pt ambulated around room with RW to gather clothing prior to bathing in room shower.  Therapist provided overall distant supervision during mobility.  Pt completed bathing at sit > stand level with supervision.  Completed dressing at mod I level.  Ambulated to ADL apt with RW with supervision.  Pt completed furniture transfers with min cues and supervision and tub/shower transfer with use of tub bench with supervision.  Engaged in 9 hole peg test with Rt: 33 seconds, Lt: 47 seconds (improved from 2:53 on eval).  Pt demonstrating overall improved fine and gross motor movements in LUE.  Returned to room and left upright in w/c awaiting SLP.  Therapy Documentation Precautions:  Precautions Precautions: Fall Precaution Comments: mild L inattention Restrictions Weight Bearing Restrictions: No Pain:  Pt with no c/o pain  See Function Navigator for Current Functional Status.   Therapy/Group: Individual Therapy  Simonne Come 04/11/2018, 10:04 AM

## 2018-04-11 NOTE — Progress Notes (Signed)
Physical Therapy Session Note  Patient Details  Name: [name] MRN: [id number] Date of Birth: 1941/04/25  Today's Date: 04/11/2018 PT Individual Time: 0703-0801 PT Individual Time Calculation (min): 58 min   Short Term Goals: Week 2:  PT Short Term Goal 1 (Week 2): STG = LTG due to estimated d/c date.  Skilled Therapeutic Interventions/Progress Updates:  Pt received in w/c & agreeable to tx, denying c/o pain. Pt reports need to use restroom & ambulated within room & bathroom with RW & supervision, completed 3/3 toileting steps and with continent void. Pt performed hand hygiene standing at sink with supervision then donned socks & shoes from w/c with set up assist. Pt ambulates around unit over even & uneven surfaces, completes transfers including car transfer at [facility] simulated height & low compliant couch, and negotiated curb with RW with supervision overall. Pt negotiates 12 steps laterally with L rail with supervision. Pt utilized cybex kinetron in sitting then standing with BUE support and task focusing on LE strengthening, dynamic balance, weight shifting & NMR. Pt does require occasional cuing for upright posture & to prevent leaning on RW during ambulation. Pt completed floor transfer with min assist. At end of session pt left sitting in w/c in room with chair alarm donned & all needs in reach.   Pt denies any concerns regarding d/c home tomorrow.   Therapy Documentation Precautions:  Precautions Precautions: Fall Precaution Comments: mild L inattention Restrictions Weight Bearing Restrictions: No   See Function Navigator for Current Functional Status.   Therapy/Group: Individual Therapy  Waunita Schooner 04/11/2018, 8:03 AM

## 2018-04-12 LAB — GLUCOSE, CAPILLARY
Glucose-Capillary: 158 mg/dL — ABNORMAL HIGH (ref 70–99)
Glucose-Capillary: 160 mg/dL — ABNORMAL HIGH (ref 70–99)

## 2018-04-12 MED ORDER — ATORVASTATIN CALCIUM 80 MG PO TABS: 80.0000 mg | ORAL_TABLET | Freq: Every day | ORAL | 0 refills | Status: AC

## 2018-04-12 MED ORDER — LORAZEPAM 0.5 MG PO TABS: 0.5000 mg | ORAL_TABLET | Freq: Four times a day (QID) | ORAL | 0 refills | Status: DC

## 2018-04-12 MED ORDER — SENNOSIDES-DOCUSATE SODIUM 8.6-50 MG PO TABS: 2.0000 | ORAL_TABLET | Freq: Two times a day (BID) | ORAL | Status: DC

## 2018-04-12 MED ORDER — VENLAFAXINE HCL ER 75 MG PO CP24: 75.0000 mg | ORAL_CAPSULE | ORAL | 0 refills | Status: DC

## 2018-04-12 MED ORDER — OMEGA-3-ACID ETHYL ESTERS 1 G PO CAPS: 1.0000 g | ORAL_CAPSULE | Freq: Two times a day (BID) | ORAL | 0 refills | Status: DC

## 2018-04-12 MED ORDER — ACETAMINOPHEN 325 MG PO TABS: 650.0000 mg | ORAL_TABLET | ORAL | Status: AC | PRN

## 2018-04-12 MED ORDER — MIRTAZAPINE 15 MG PO TABS: 15.0000 mg | ORAL_TABLET | Freq: Every day | ORAL | 1 refills | Status: AC

## 2018-04-12 MED ORDER — ARIPIPRAZOLE 5 MG PO TABS: 5.0000 mg | ORAL_TABLET | Freq: Every day | ORAL | 3 refills | Status: AC

## 2018-04-12 MED ORDER — INSULIN ASPART 100 UNIT/ML FLEXPEN: 14.0000 [IU] | PEN_INJECTOR | Freq: Two times a day (BID) | SUBCUTANEOUS | 11 refills | Status: DC

## 2018-04-12 MED ORDER — CLOPIDOGREL BISULFATE 75 MG PO TABS: 75.0000 mg | ORAL_TABLET | Freq: Every day | ORAL | 0 refills | Status: DC

## 2018-04-12 MED ORDER — ASPIRIN 325 MG PO TBEC: 325.0000 mg | DELAYED_RELEASE_TABLET | Freq: Once | ORAL | 0 refills | Status: AC

## 2018-04-12 NOTE — Progress Notes (Signed)
Subjective/Complaints:  Daughter in law will pick up pt today  ROS: denies CP, SOB, N/V/D  Objective: Vital Signs: Blood pressure (!) 143/86, pulse 91, temperature 98.5 F (36.9 C), resp. rate 16, height 5' 7" (1.702 m), weight 85 kg, SpO2 98 %. No results found. Results for orders placed or performed during the hospital encounter of 03/28/18 (from the past 72 hour(s))  Glucose, capillary     Status: Abnormal   Collection Time: 04/09/18 12:08 PM  Result Value Ref Range   Glucose-Capillary 207 (H) 70 - 99 mg/dL  Glucose, capillary     Status: Abnormal   Collection Time: 04/09/18  4:24 PM  Result Value Ref Range   Glucose-Capillary 144 (H) 70 - 99 mg/dL  Glucose, capillary     Status: Abnormal   Collection Time: 04/09/18  9:48 PM  Result Value Ref Range   Glucose-Capillary 196 (H) 70 - 99 mg/dL   Comment 1 Notify RN   Glucose, capillary     Status: Abnormal   Collection Time: 04/10/18  6:23 AM  Result Value Ref Range   Glucose-Capillary 155 (H) 70 - 99 mg/dL   Comment 1 Notify RN   Basic metabolic panel     Status: Abnormal   Collection Time: 04/10/18 10:45 AM  Result Value Ref Range   Sodium 139 135 - 145 mmol/L   Potassium 4.5 3.5 - 5.1 mmol/L   Chloride 106 98 - 111 mmol/L   CO2 23 22 - 32 mmol/L   Glucose, Bld 214 (H) 70 - 99 mg/dL   BUN 37 (H) 8 - 23 mg/dL   Creatinine, Ser 1.97 (H) 0.44 - 1.00 mg/dL   Calcium 9.8 8.9 - 10.3 mg/dL   GFR calc non Af Amer 23 (L) >60 mL/min   GFR calc Af Amer 27 (L) >60 mL/min    Comment: (NOTE) The eGFR has been calculated using the CKD EPI equation. This calculation has not been validated in all clinical situations. eGFR's persistently <60 mL/min signify possible Chronic Kidney Disease.    Anion gap 10 5 - 15    Comment: Performed at [facility] [address], [place], [place] [address]  Glucose, capillary     Status: Abnormal   Collection Time: 04/10/18 11:45 AM  Result Value Ref Range   Glucose-Capillary 162 (H) 70  - 99 mg/dL  Glucose, capillary     Status: Abnormal   Collection Time: 04/10/18  4:38 PM  Result Value Ref Range   Glucose-Capillary 131 (H) 70 - 99 mg/dL  Glucose, capillary     Status: Abnormal   Collection Time: 04/10/18  9:37 PM  Result Value Ref Range   Glucose-Capillary 115 (H) 70 - 99 mg/dL  Glucose, capillary     Status: Abnormal   Collection Time: 04/11/18  6:43 AM  Result Value Ref Range   Glucose-Capillary 161 (H) 70 - 99 mg/dL  Glucose, capillary     Status: Abnormal   Collection Time: 04/11/18 11:45 AM  Result Value Ref Range   Glucose-Capillary 157 (H) 70 - 99 mg/dL  Glucose, capillary     Status: Abnormal   Collection Time: 04/11/18  4:41 PM  Result Value Ref Range   Glucose-Capillary 157 (H) 70 - 99 mg/dL  Glucose, capillary     Status: Abnormal   Collection Time: 04/11/18  9:08 PM  Result Value Ref Range   Glucose-Capillary 186 (H) 70 - 99 mg/dL  Glucose, capillary     Status: Abnormal  Collection Time: 04/12/18  6:40 AM  Result Value Ref Range   Glucose-Capillary 158 (H) 70 - 99 mg/dL     Constitutional: No distress . Vital signs reviewed. HENT: Normocephalic.  Atraumatic. Eyes: EOMI. No discharge. Cardiovascular: RRR. No JVD. Respiratory: CTA bilaterally. Normal effort. GI: BS +. Non-distended. Musc: No edema or tenderness in extremities. Skin:   Warm and dry. Intact. Neuro: Alert/Oriented,  Motor: 4--4/5 LUE proximal to distal 4-/5 proximal to distal LLE 4+/5 RUE proximal to distal  Assessment/Plan: 1. 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 Stable for D/C today F/u PCP in 3-4 weeks F/u PM&R 2 weeks See D/C summary See D/C instructions FIM: Function - Bathing Position: Shower Body parts bathed by patient: Right arm, Left arm, Chest, Abdomen, Front perineal area, Right upper leg, Left upper leg, Buttocks, Right lower leg, Left lower leg Body parts bathed by helper: Back Assist Level:  Supervision or verbal cues  Function- Upper Body Dressing/Undressing What is the patient wearing?: Pull over shirt/dress Pull over shirt/dress - Perfomed by patient: Thread/unthread right sleeve, Thread/unthread left sleeve, Put head through opening, Pull shirt over trunk Pull over shirt/dress - Perfomed by helper: Pull shirt over trunk Assist Level: Assistive device Assistive Device Comment: ambulated with RW to obtain clothing Set up : To obtain clothing/put away Function - Lower Body Dressing/Undressing What is the patient wearing?: Underwear, Pants, Socks, Shoes Position: Sitting EOB Underwear - Performed by patient: Thread/unthread right underwear leg, Thread/unthread left underwear leg, Pull underwear up/down Pants- Performed by patient: Thread/unthread right pants leg, Pull pants up/down, Thread/unthread left pants leg Pants- Performed by helper: Thread/unthread left pants leg Non-skid slipper socks- Performed by patient: Don/doff right sock, Don/doff left sock Non-skid slipper socks- Performed by helper: Don/doff right sock, Don/doff left sock Socks - Performed by patient: Don/doff right sock, Don/doff left sock Shoes - Performed by patient: Don/doff right shoe, Don/doff left shoe, Fasten right, Fasten left TED Hose - Performed by helper: Don/doff right TED hose, Don/doff left TED hose Assist for footwear: Independent Assist for lower body dressing: Assistive device Assistive Device Comment: sit > stand with RW  Function - Toileting Toileting steps completed by patient: Adjust clothing prior to toileting, Performs perineal hygiene Toileting steps completed by helper: Adjust clothing prior to toileting, Performs perineal hygiene, Adjust clothing after toileting Toileting Assistive Devices: Grab bar or rail Assist level: Supervision or verbal cues  Function Midwife transfer assistive device: Grab bar, Walker Assist level to toilet: No Help, no cues, assistive  device, takes more than a reasonable amount of time Assist level from toilet: No Help, no cues, assistive device, takes more than a reasonable amount of time Assist level to bedside commode (at bedside): Touching or steadying assistance (Pt > 75%) Assist level from bedside commode (at bedside): Moderate assist (Pt 50 - 74%/lift or lower)(per Elby Beck, NT)  Function - Chair/bed transfer Chair/bed transfer method: Ambulatory Chair/bed transfer assist level: Supervision or verbal cues Chair/bed transfer assistive device: Walker Chair/bed transfer details: Verbal cues for sequencing, Verbal cues for technique  Function - Locomotion: Wheelchair Will patient use wheelchair at discharge?: No Type: Manual Wheelchair activity did not occur: N/A Max wheelchair distance: 75 Assist Level: Supervision or verbal cues Wheel 50 feet with 2 turns activity did not occur: N/A Assist Level: Supervision or verbal cues Wheel 150 feet activity did not occur: N/A Turns around,maneuvers to table,bed, and toilet,negotiates 3% grade,maneuvers on rugs and over doorsills: No Function -  Locomotion: Ambulation Assistive device: Walker-rolling Max distance: >150 ft  Assist level: Supervision or verbal cues Assist level: Supervision or verbal cues Assist level: Supervision or verbal cues Walk 150 feet activity did not occur: Safety/medical concerns(fatigue) Assist level: Supervision or verbal cues Walk 10 feet on uneven surfaces activity did not occur: Safety/medical concerns Assist level: Supervision or verbal cues  Function - Comprehension Comprehension: Auditory Comprehension assist level: Understands complex 90% of the time/cues 10% of the time  Function - Expression Expression: Verbal Expression assist level: Expresses complex 90% of the time/cues < 10% of the time  Function - Social Interaction Social Interaction assist level: Interacts appropriately 90% of the time - Needs monitoring or encouragement  for participation or interaction.  Function - Problem Solving Problem solving assist level: Solves complex 90% of the time/cues < 10% of the time  Function - Memory Memory assist level: Recognizes or recalls 75 - 89% of the time/requires cueing 10 - 24% of the time Patient normally able to recall (first 3 days only): Current season, Location of own room, Staff names and faces, That he or she is in a hospital  Medical Problem List and Plan: 1.  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 Pain Management: Tylenol as needed 4. Mood: Remeron 15 mg nightly, Effexor or 75 mg every other day, Ativan 0.4 mg 4 times daily, Abilify 5 mg daily Dr Sima Matas to follow- some lability reported when challanged per SLP- focus on functional activities 5. Neuropsych: This patient is capable of making decisions on her own behalf. 6. Skin/Wound Care: Routine skin checks 7. Fluids/Electrolytes/Nutrition: Routine in and outs 8.  Diabetes mellitus.  Hemoglobin A1c 8.6.    Check blood sugars before meals and at bedtime CBG (last 3)  Recent Labs    04/11/18 1641 04/11/18 2108 04/12/18 0640  GLUCAP 157* 186* 158*   Home dose NovoLog 70/30 10 units twice daily increased to 14U on 9/13  Overall in hospital desired range 140-180 9.  History of tachycardia with ablation 2007.  No chest pain or shortness of breath. 10.  CKD stage III.  Baseline creatinine 1.95.    Creatinine 1.91 on 9/4  Labs pnd 11.  History of breast cancer with lumpectomy.  Follow-up outpatient 12.  Hyperlipidemia.  Lipitor- 67m  13.  Normotensive off BP meds- will need PCP f/u with Dr PJoylene Draftto monitor and potentially restart BP meds Vitals:   04/11/18 2005 04/12/18 0506  BP: 125/74 (!) 143/86  Pulse: 76 91  Resp: 16 16  Temp: 98.2 F  (36.8 C) 98.5 F (36.9 C)  SpO2: 97% 98%   LOS (Days) 15 A FACE TO FACE EVALUATION WAS PERFORMED  ACharlett Blake9/18/2019, 9:44 AM

## 2018-04-12 NOTE — Progress Notes (Signed)
Pt given discharge instructions by Lauraine Rinne, PA. Pt has all belongings with her and discharged home with daughter-in-law,.

## 2018-04-12 NOTE — Discharge Instructions (Signed)
Inpatient Rehab Discharge Instructions  Laela Deviney Discharge date and time: No discharge date for patient encounter.   Activities/Precautions/ Functional Status: Activity: activity as tolerated Diet: diabetic diet Wound Care: none needed Functional status:  ___ No restrictions     ___ Walk up steps independently ___ 24/7 supervision/assistance   ___ Walk up steps with assistance ___ Intermittent supervision/assistance  ___ Bathe/dress independently ___ Walk with walker     _x__ Bathe/dress with assistance ___ Walk Independently    ___ Shower independently ___ Walk with assistance    ___ Shower with assistance ___ No alcohol     ___ Return to work/school ________  COMMUNITY REFERRALS UPON DISCHARGE:   Home Health:   PT     OT     ST     RN  Agency:  [facility] Phone:  [phone] Medical Equipment/Items Ordered:   Rolling walker; bedside commode; tub transfer bench  Agency/Supplier:  [facility]           Phone:  [phone]  GENERAL COMMUNITY RESOURCES FOR PATIENT/FAMILY: Support Groups:  [facility] Stroke Support Group                              Meets the second Thursday of each month from 6-7PM (except June, July, and August)                              In the dayroom of [facility] [address]                              For more information, please call [phone]  Special Instructions:  Continue aspirin and Plavix through 04/14/2018 then Plavix alone  Follow-up with PCP for monitoring of blood pressure   STROKE/TIA DISCHARGE INSTRUCTIONS SMOKING Cigarette smoking nearly doubles your risk of having a stroke & is the single most alterable risk factor  If you smoke or have smoked in the last 12 months, you are advised to quit smoking for your health.  Most of the excess cardiovascular risk related to smoking disappears within a year of stopping.  Ask you doctor about anti-smoking  medications  [place] Quit Line: 1-800-QUIT NOW  Free Smoking Cessation Classes [phone]  CHOLESTEROL Know your levels; limit fat & cholesterol in your diet  Lipid Panel     Component Value Date/Time   CHOL 189 03/25/2018 0247   TRIG 578 (H) 03/25/2018 0247   HDL 25 (L) 03/25/2018 0247   CHOLHDL 7.6 03/25/2018 0247   VLDL UNABLE TO CALCULATE IF TRIGLYCERIDE OVER 400 mg/dL 03/25/2018 0247   LDLCALC UNABLE TO CALCULATE IF TRIGLYCERIDE OVER 400 mg/dL 03/25/2018 0247      Many patients benefit from treatment even if their cholesterol is at goal.  Goal: Total Cholesterol (CHOL) less than 160  Goal:  Triglycerides (TRIG) less than 150  Goal:  HDL greater than 40  Goal:  LDL (LDLCALC) less than 100   BLOOD PRESSURE American Stroke Association blood pressure target is less that 120/80 mm/Hg  Your discharge blood pressure is:  BP: (!) 142/75  Monitor your blood pressure  Limit your salt and alcohol intake  Many individuals will require more than one medication for high blood pressure  DIABETES (A1c is a blood sugar average for  last 3 months) Goal HGBA1c is under 7% (HBGA1c is blood sugar average for last 3 months)  Diabetes:     Lab Results  Component Value Date   HGBA1C 8.6 (H) 03/25/2018     Your HGBA1c can be lowered with medications, healthy diet, and exercise.  Check your blood sugar as directed by your physician  Call your physician if you experience unexplained or low blood sugars.  PHYSICAL ACTIVITY/REHABILITATION Goal is 30 minutes at least 4 days per week  Activity: Increase activity slowly, Therapies: Physical Therapy: Home Health Return to work:   Activity decreases your risk of heart attack and stroke and makes your heart stronger.  It helps control your weight and blood pressure; helps you relax and can improve your mood.  Participate in a regular exercise program.  Talk with your doctor about the best form of exercise for you (dancing, walking, swimming,  cycling).  DIET/WEIGHT Goal is to maintain a healthy weight  Your discharge diet is:  Diet Order            Diet Carb Modified Fluid consistency: Thin; Room service appropriate? Yes  Diet effective now              liquids Your height is:  Height: 5\' 7"  (170.2 cm) Your current weight is: Weight: 85 kg Your Body Mass Index (BMI) is:  BMI (Calculated): 29.34  Following the type of diet specifically designed for you will help prevent another stroke.  Your goal weight range is:    Your goal Body Mass Index (BMI) is 19-24.  Healthy food habits can help reduce 3 risk factors for stroke:  High cholesterol, hypertension, and excess weight.  RESOURCES Stroke/Support Group:  Call [phone]   STROKE EDUCATION PROVIDED/REVIEWED AND GIVEN TO PATIENT Stroke warning signs and symptoms How to activate emergency medical system (call 911). Medications prescribed at discharge. Need for follow-up after discharge. Personal risk factors for stroke. Pneumonia vaccine given:  Flu vaccine given:  My questions have been answered, the writing is legible, and I understand these instructions.  I will adhere to these goals & educational materials that have been provided to me after my discharge from the hospital.      My questions have been answered and I understand these instructions. I will adhere to these goals and the provided educational materials after my discharge from the hospital.  Patient/Caregiver Signature _______________________________ Date __________  Clinician Signature _______________________________________ Date __________  Please bring this form and your medication list with you to all your follow-up doctor's appointments.

## 2018-04-13 ENCOUNTER — Telehealth: Payer: Self-pay

## 2018-04-13 ENCOUNTER — Telehealth: Payer: Self-pay | Admitting: *Deleted

## 2018-04-13 NOTE — Telephone Encounter (Signed)
Transitional Care Call 1st attempt  Dialed home number provided in patients MR. Message states that mailbox is full.

## 2018-04-13 NOTE — Telephone Encounter (Signed)
[name] from [facility] called requesting verbal orders for pt POC and had questions about her Plavix. I called [name] back and left message letting her know that discharge summary says pt will get HHPT, OT, SP and RN and also that she will be following up with Dr. Harrell Gave End for cardiology. Told her to call if all questions have not been answered.

## 2018-04-13 NOTE — Progress Notes (Signed)
Social Work Discharge Note  The overall goal for the admission was met for:   Discharge location: Yes - home with family and caregiver  Length of Stay: Yes - 15 days  Discharge activity level: Yes - supervision  Home/community participation: Yes  Services provided included: MD, RD, PT, OT, SLP, RN, Pharmacy, Neuropsych and SW  Financial Services: Private Insurance: [employer]  Follow-up services arranged: Home Health: PT/OT/ST/RN from [facility], DME: rolling walker; tub transfer bench; 3-in-1 commode from [facility] and Patient/Family has no preference for HH/DME agencies  Comments (or additional information): Pt's dtr-in-law observed therapies and arranged caregiver for pt at home.  Pt's son was present on CIR daily to see pt in therapy and talk with therapists and MD.  They are prepared to care for pt in pt's home with caregiver(s) and family to provide the recommended supervision for pt.  Pt feels comfortable with this plan.  Patient/Family verbalized understanding of follow-up arrangements: Yes  Individual responsible for coordination of the follow-up plan: pt and pt's dtr-in-law  Confirmed correct DME delivered: Trey Sailors 04/13/2018    Loretta Doutt, Silvestre Mesi

## 2018-04-14 ENCOUNTER — Other Ambulatory Visit: Payer: Self-pay

## 2018-04-14 ENCOUNTER — Telehealth: Payer: Self-pay | Admitting: *Deleted

## 2018-04-14 NOTE — Telephone Encounter (Signed)
[facility] requesting 1wk1.  Approval given.

## 2018-04-14 NOTE — Telephone Encounter (Signed)
[name] PT [facility] called for POC for PT  1wk1,2wk2,1wk1.  Approval given.

## 2018-04-14 NOTE — Patient Outreach (Signed)
[facility]) Care Management  04/14/2018  [name] October 15, 1940 [id number]   EMMI- Stroke not on APL RED ON EMMI ALERT Day # 1 Date: 04/14/18 Red Alert Reason:  Feeling worse overall? Yes  New problems walking/talking/speaking/seeing? Yes    Outreach attempt: No answer.  States mailbox full.   Plan: RN CM will attempt again within 4 business days.    Jone Baseman, RN, MSN [facility] Care Management Care Management Coordinator Direct Line [phone] Toll Free: [phone]  Fax: [phone]

## 2018-04-14 NOTE — Patient Outreach (Signed)
[facility]) Care Management  04/14/2018  Rondell Frick 10/07/1940 [id number]  EMMI: stroke red alert Referral date: 04/14/18 Referral reason: feeling worse overall Insurance: [place] health care Day # 3  Telephone call to patient regarding EMMI stroke red alert. HIPAA verified with patient. Explained reason for call. Patient states she is doing well and feeling fine. Patient states she is not having any symptoms or concerns.  Patient states she has a follow up appointment with her primary MD on 04/20/18.  She states she will call the neurologist and cardiologist on Monday 04/17/18 to schedule follow up visits. Patient states she has received a call from home health and they will follow up with her today.  She reports she has her medications and takes them as prescribed. Patient states her daughter in law and son who is a doctor have reviewed her medications with her. Patient states she has transportation to her appointments. Patient denies any further needs or concern at this time.  RNCM discussed [facility] care management services with patient. Offered to provide 24 hour nurse advise line. Patient request contact information be mailed to her.  Mailing address confirmed. RNCM advised patient to notify MD of any changes in condition prior to scheduled appointment. RNCM verified patient aware of 911 services for urgent/ emergent needs.   PLAN:  RNCM will close patient due to patient being assessed and having no further needs.  RNCM will send patient [facility] care management brochure/ magnet as discussed , RNCM will send patients primary MD closure notification   Quinn Plowman RN,BSN,CCM [facility] Telephonic  [phone]

## 2018-04-14 NOTE — Telephone Encounter (Addendum)
Transitional Care Call Completed, Appointment confirmed, addtress confirmed, new patient packet mailed, family given verbal instructions regarding our location and appointment/arrival time  Transitional Care Questions   Questions for our staff to ask patients on Transitional care 48 hour phone call:   1. Are you/is patient experiencing any problems since coming home? No  Are there any questions regarding any aspect of care? No  2. Are there any questions regarding medications administration/dosing? No  Are meds being taken as prescribed? Yes  Patient should review meds with caller to confirm   3. Have there been any falls? No  4. Has Home Health been to the house and/or have they contacted you? Yes  If not, have you tried to contact them? Can we help you contact them?   5. Are bowels and bladder emptying properly? Yes  Are there any unexpected incontinence issues? No If applicable, is patient following bowel/bladder programs?   6. Any fevers, problems with breathing, unexpected pain? No, No, No  7. Are there any skin problems or new areas of breakdown?  No  8. Has the patient/family member arranged specialty MD follow up (ie cardiology/neurology/renal/surgical/etc)?  Yes Can we help arrange?   9. Does the patient need any other services or support that we can help arrange? No  10. Are caregivers following through as expected in assisting the patient? Yes  11. Has the patient quit smoking, drinking alcohol, or using drugs as recommended? No drink, No smoke, No illicit drug use

## 2018-04-17 ENCOUNTER — Ambulatory Visit: Payer: Medicare Other

## 2018-04-20 DIAGNOSIS — E669 Obesity, unspecified: Secondary | ICD-10-CM | POA: Insufficient documentation

## 2018-04-20 DIAGNOSIS — I5032 Chronic diastolic (congestive) heart failure: Secondary | ICD-10-CM | POA: Insufficient documentation

## 2018-04-21 ENCOUNTER — Other Ambulatory Visit: Payer: Self-pay | Admitting: Internal Medicine

## 2018-04-21 DIAGNOSIS — Z1231 Encounter for screening mammogram for malignant neoplasm of breast: Secondary | ICD-10-CM

## 2018-04-24 ENCOUNTER — Telehealth: Payer: Self-pay | Admitting: *Deleted

## 2018-04-24 NOTE — Telephone Encounter (Signed)
Henderson Newcomer, OT, [facility] left a message to continue HHOT 1week1 followed by 2week2 followed by 1week2. Medical record reviewed. Social work note reviewed.  Verbal orders given per office protocol.

## 2018-04-24 NOTE — Telephone Encounter (Addendum)
Manuela Schwartz OT [facility] called for orders for 1wk1,2wk2,1wk1. Approval given.

## 2018-04-25 ENCOUNTER — Encounter: Payer: Medicare Other | Attending: Physical Medicine & Rehabilitation

## 2018-04-25 ENCOUNTER — Ambulatory Visit: Payer: Medicare Other | Admitting: Physical Medicine & Rehabilitation

## 2018-04-25 ENCOUNTER — Encounter: Payer: Self-pay | Admitting: Physical Medicine & Rehabilitation

## 2018-04-25 VITALS — BP 139/88 | HR 89 | Resp 14 | Ht 67.5 in | Wt 182.0 lb

## 2018-04-25 DIAGNOSIS — I10 Essential (primary) hypertension: Secondary | ICD-10-CM | POA: Diagnosis not present

## 2018-04-25 DIAGNOSIS — E1122 Type 2 diabetes mellitus with diabetic chronic kidney disease: Secondary | ICD-10-CM | POA: Diagnosis not present

## 2018-04-25 DIAGNOSIS — I6389 Other cerebral infarction: Secondary | ICD-10-CM

## 2018-04-25 DIAGNOSIS — E782 Mixed hyperlipidemia: Secondary | ICD-10-CM

## 2018-04-25 DIAGNOSIS — I639 Cerebral infarction, unspecified: Secondary | ICD-10-CM | POA: Diagnosis present

## 2018-04-25 DIAGNOSIS — I129 Hypertensive chronic kidney disease with stage 1 through stage 4 chronic kidney disease, or unspecified chronic kidney disease: Secondary | ICD-10-CM | POA: Diagnosis not present

## 2018-04-25 DIAGNOSIS — F329 Major depressive disorder, single episode, unspecified: Secondary | ICD-10-CM | POA: Diagnosis not present

## 2018-04-25 DIAGNOSIS — Z923 Personal history of irradiation: Secondary | ICD-10-CM | POA: Diagnosis not present

## 2018-04-25 DIAGNOSIS — E785 Hyperlipidemia, unspecified: Secondary | ICD-10-CM | POA: Diagnosis not present

## 2018-04-25 DIAGNOSIS — Z853 Personal history of malignant neoplasm of breast: Secondary | ICD-10-CM | POA: Diagnosis not present

## 2018-04-25 DIAGNOSIS — I69354 Hemiplegia and hemiparesis following cerebral infarction affecting left non-dominant side: Secondary | ICD-10-CM

## 2018-04-25 DIAGNOSIS — N183 Chronic kidney disease, stage 3 (moderate): Secondary | ICD-10-CM | POA: Diagnosis not present

## 2018-04-25 NOTE — Patient Instructions (Signed)
No driving until oked by MD  No use of stove

## 2018-04-25 NOTE — Progress Notes (Signed)
Subjective:    Patient ID: [name], female    DOB: 1940-11-09, 77 y.o.   MRN: [id number] Transitional care visit ADMIT DATE:  03/28/2018   DISCHARGE DATE:  04/12/2018 77 year old right-handed female with history of tachycardia, status post ablation in 2007, diabetes mellitus, CKD stage III.  Lives alone, independent prior to admission.  Family provides Doctor, hospital.  A 1-level home.   Presented 03/24/2018 with left-sided weakness, facial droop, slurred speech.  Cranial CT scan negative.  The patient did receive tPA.  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  No significant proximal  stenosis, aneurysm or branch vessel occlusion.  Echocardiogram with ejection fraction of 12%, grade I diastolic dysfunction.  Carotid Dopplers negative for ICA stenosis.  Neurology consulted.  Placed on aspirin and Plavix therapy.  Subcutaneous heparin  for DVT prophylaxis.  The patient was admitted for comprehensive rehabilitation program.    HPI  [name] used with PT in the house but uses walker when she leaves the house.  No falls.  Dressing and bathing with tub bench Seen by nurse practitioner PCP will see Dr Joylene Draft this Friday No new problems at home.  CNA comes in 3hrs per day.  Right facial droop improved aspirin and Plavix therapy through 04/14/2018 then Plavix alone.  No bowel or bladder accidents, but stool a little loose, reduce stool softener to one tab twice a day.   Pain Inventory Average Pain 0 Pain Right Now 0 My pain is no pain  In the last 24 hours, has pain interfered with the following? General activity 0 Relation with others 0 Enjoyment of life 0 What TIME of day is your pain at its worst? no pain Sleep (in general) Good  Pain is worse with: no pain Pain improves with: no pain Relief from Meds: no pain  Mobility walk with assistance use a walker how many minutes can you walk? 20 ability to climb steps?  yes do you  drive?  no  Function retired I need assistance with the following:  toileting, meal prep, household duties and shopping  Neuro/Psych bowel control problems tingling trouble walking anxiety  Prior Studies transitional care  Physicians involved in your care transitional care   Family History  Problem Relation Age of Onset  . Peripheral Artery Disease Mother 9       HX OF POOR CIRCULATION, SMOKING, LEG AMPUTATION  . Cancer Father        cancer of spine  . Asthma Brother 31  . Kidney Stones Brother   . Breast cancer Neg Hx    Social History   Socioeconomic History  . Marital status: Divorced    Spouse name: Not on file  . Number of children: 2  . Years of education: Not on file  . Highest education level: Not on file  Occupational History  . Not on file  Social Needs  . Financial resource strain: Not on file  . Food insecurity:    Worry: Not on file    Inability: Not on file  . Transportation needs:    Medical: Not on file    Non-medical: Not on file  Tobacco Use  . Smoking status: Never Smoker  . Smokeless tobacco: Never Used  Substance and Sexual Activity  . Alcohol use: No  . Drug use: No  . Sexual activity: Not Currently  Lifestyle  . Physical activity:    Days per week: Not on file    Minutes per session:  Not on file  . Stress: Not on file  Relationships  . Social connections:    Talks on phone: Not on file    Gets together: Not on file    Attends religious service: Not on file    Active member of club or organization: Not on file    Attends meetings of clubs or organizations: Not on file    Relationship status: Not on file  Other Topics Concern  . Not on file  Social History Narrative  . Not on file   Past Surgical History:  Procedure Laterality Date  . ABLATION  2007   fast heart rate  . ANKLE FRACTURE SURGERY Left 2014  . BREAST LUMPECTOMY Right   . CATARACT EXTRACTION Right 2017  . colonscopy  2015  . HYSTEROTOMY     45 years ago    . TONSILLECTOMY AND ADENOIDECTOMY     age 89   Past Medical History:  Diagnosis Date  . Anxiety   . Back ache   . Breast cancer ([place])    right  . Bronchitis   . Cancer of colon ([place])   . Depression   . DM (diabetes mellitus) ([place])   . Dyspnea on exertion   . Fall on steps    age 77 feel down iron stairs couldnot move leg for 6 mths  . Family history of primary TB   . Fracture of ankle, closed 2014   left  . History of chicken pox   . History of hysterectomy 04/1980  . History of primary TB   . Hyperlipidemia   . Hypertension   . Kidney disease, chronic, stage III (moderate, EGFR 30-59 ml/min) ([place]) 2017  . Kidney disorder    reduction function  . Kidney stones   . Knee pain, right   . Lung abnormality    age 65 patient had pna spot on lungs   . Microalbuminuria   . Muscle cramps    both legs  . Muscular degeneration    of right eye  . Nephrolithiasis   . Palpitations   . Personal history of radiation therapy    BP 139/88 (BP Location: Left Arm, Patient Position: Sitting, Cuff Size: Normal)   Pulse 89   Resp 14   Ht 5' 7.5" (1.715 m)   Wt 182 lb (82.6 kg)   SpO2 96%   BMI 28.08 kg/m   Opioid Risk Score:   Fall Risk Score:  `1  Depression screen PHQ 2/9  No flowsheet data found.  Review of Systems  Constitutional: Positive for chills and fever.  Eyes: Negative.   Respiratory: Positive for cough.   Gastrointestinal: Positive for diarrhea.  Endocrine:       High blood sugar  Genitourinary: Negative.   Musculoskeletal: Positive for gait problem.  Skin: Negative.   Neurological:       Tingling  Hematological: Negative.   Psychiatric/Behavioral: The patient is nervous/anxious.        Objective:   Physical Exam  Constitutional: She is oriented to person, place, and time. She appears well-developed and well-nourished. No distress.  HENT:  Head: Normocephalic and atraumatic.  Eyes: Pupils are equal, round, and reactive to light. EOM are normal.   Neck: Normal range of motion.  Cardiovascular: Normal rate, regular rhythm and normal heart sounds. Exam reveals no friction rub.  No murmur heard. Pulmonary/Chest: Effort normal and breath sounds normal. No respiratory distress.  Abdominal: Soft. Bowel sounds are normal. She exhibits no distension. There is  no tenderness.  Musculoskeletal: Normal range of motion. She exhibits no edema.  Neurological: She is alert and oriented to person, place, and time. No sensory deficit. She exhibits normal muscle tone. Seizure activity: VII. Coordination and gait abnormal.  Neuro:  Eyes without evidence of nystagmus  Tone is normal without evidence of spasticity Cerebellar exam shows no evidence of ataxia on finger nose finger or heel to shin testing No evidence of trunkal ataxia  Motor strength is 5/5 in RIGHT deltoid, biceps, triceps, finger flexors and extensors, wrist flexors and extensors, hip flexors, knee flexors and extensors, ankle dorsiflexors, plantar flexors, invertors and evertors, toe flexors and extensors 4/5 Left delt bi, tri , grip, 5/5 HF, KE, 4/5 ankle DF  Sensory exam is normal to light touch in the upper and lower limbs   Mild Left central VII       Skin: Skin is warm and dry. She is not diaphoretic.  Psychiatric: She has a normal mood and affect. Her behavior is normal.  Nursing note and vitals reviewed.         Assessment & Plan:  1.  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 Overall doing wel with recovery. We discussed that PT will advance her assistive device  We also discussed no driving and use of stove  Asked pt to inform OT that she'd like to practice light meal prep  F/u Neuro in 3-4 wks  PMR f/u in 1 month, likely transition to OP PT, OT at that time  2. Freq stools will reduce senna S to 1 po BID  3. HLD and HTN, f/u Dr Joylene Draft this week

## 2018-04-26 ENCOUNTER — Other Ambulatory Visit: Payer: Self-pay

## 2018-04-26 NOTE — Patient Outreach (Signed)
[facility]) Care Management  04/26/2018  Abena Erdman [birth year] [id number]   EMMI: stroke red alert Referral date: 04/26/18 Referral reason: went to follow up appointment: no Insurance: united [employer] care Day # 13 Attempt #1  Telephone call to patient regarding referral. Unable to reach patient or leave voice message. Message states,Mailbox is full, not enough space to leave a message .  PLAN: RNCM will attempt 2nd telephone call to patient within 4 business days. RNCM will send outreach letter.   Quinn Plowman RN,BSN, [place] Telephonic  [phone] .

## 2018-04-27 ENCOUNTER — Other Ambulatory Visit: Payer: Self-pay

## 2018-04-27 NOTE — Patient Outreach (Signed)
[facility]) Care Management  04/27/2018  [name] 1940-10-05 [id number]  EMMI: stroke red alert Referral date: 04/26/18 Referral reason: went to follow up appointment: no Insurance: united health care Day # 13  Telephone call to patient regarding EMMI stroke red alert. HIPAA verified with patient. Explained reason for call. Patient states she has seen Dr. Letta Pate, rehab specialist and has a follow up appointment with her primary MD on 04/28/18.  Patient  States she has a home health nurse seeing her 2 times per week with [facility].  Patient state she is doing and feeling very well.  Patient reports her daughter in law and son monitor her health very closely. Patient reports her son is a Tax adviser.  Patient denies any new concerns or problems at this time.   RNCM inquired if patient had received the [facility] Care management brochure/ magnet. Patient states her daughter in law handles all of her mail.  RNCM advised patient to notify MD of any changes in condition prior to scheduled appointment. RNCM verified patient aware of 911 services for urgent/ emergent needs.,  PLAN; RNCM will close patient due to patient being assessed and having no further needs.  RNCM will send patients primary MD closure notification   Quinn Plowman RN,BSN,CCM [facility] Telephonic  [phone]

## 2018-04-28 DIAGNOSIS — D649 Anemia, unspecified: Secondary | ICD-10-CM | POA: Insufficient documentation

## 2018-05-02 ENCOUNTER — Telehealth: Payer: Self-pay

## 2018-05-02 NOTE — Telephone Encounter (Signed)
Frankie, HHPT from [facility] called requesting a continuous of total 3 additional visits of POC to help with balance indurance training. Orders approved per discharge summary. No readmission.

## 2018-05-16 ENCOUNTER — Encounter: Payer: Self-pay | Admitting: Neurology

## 2018-05-16 ENCOUNTER — Ambulatory Visit: Payer: Medicare Other | Admitting: Neurology

## 2018-05-16 VITALS — BP 127/88 | HR 98 | Ht 67.5 in | Wt 183.8 lb

## 2018-05-16 DIAGNOSIS — I6350 Cerebral infarction due to unspecified occlusion or stenosis of unspecified cerebral artery: Secondary | ICD-10-CM

## 2018-05-16 NOTE — Patient Instructions (Signed)
I had a long d/w patient and her daughter in law  about her recent Pontine stroke, risk for recurrent stroke/TIAs, personally independently reviewed imaging studies and stroke evaluation results and answered questions.Continue  Plavix 75 mg daily and stop aspirin now  for secondary stroke prevention and maintain strict control of hypertension with blood pressure goal below 130/90, diabetes with hemoglobin A1c goal below 6.5% and lipids with LDL cholesterol goal below 70 mg/dL. I also advised the patient to eat a healthy diet with plenty of whole grains, cereals, fruits and vegetables, exercise regularly and maintain ideal body weight . I advised her to sue a cane or walker at all times and we discussed fall safety precautions.Followup in the future with my nurse practitioner Janett Billow in 6 months or call earlier if necessary.   Fall Prevention in the Home Falls can cause injuries. They can happen to people of all ages. There are many things you can do to make your home safe and to help prevent falls. What can I do on the outside of my home?  Regularly fix the edges of walkways and driveways and fix any cracks.  Remove anything that might make you trip as you walk through a door, such as a raised step or threshold.  Trim any bushes or trees on the path to your home.  Use bright outdoor lighting.  Clear any walking paths of anything that might make someone trip, such as rocks or tools.  Regularly check to see if handrails are loose or broken. Make sure that both sides of any steps have handrails.  Any raised decks and porches should have guardrails on the edges.  Have any leaves, snow, or ice cleared regularly.  Use sand or salt on walking paths during winter.  Clean up any spills in your garage right away. This includes oil or grease spills. What can I do in the bathroom?  Use night lights.  Install grab bars by the toilet and in the tub and shower. Do not use towel bars as grab  bars.  Use non-skid mats or decals in the tub or shower.  If you need to sit down in the shower, use a plastic, non-slip stool.  Keep the floor dry. Clean up any water that spills on the floor as soon as it happens.  Remove soap buildup in the tub or shower regularly.  Attach bath mats securely with double-sided non-slip rug tape.  Do not have throw rugs and other things on the floor that can make you trip. What can I do in the bedroom?  Use night lights.  Make sure that you have a light by your bed that is easy to reach.  Do not use any sheets or blankets that are too big for your bed. They should not hang down onto the floor.  Have a firm chair that has side arms. You can use this for support while you get dressed.  Do not have throw rugs and other things on the floor that can make you trip. What can I do in the kitchen?  Clean up any spills right away.  Avoid walking on wet floors.  Keep items that you use a lot in easy-to-reach places.  If you need to reach something above you, use a strong step stool that has a grab bar.  Keep electrical cords out of the way.  Do not use floor polish or wax that makes floors slippery. If you must use wax, use non-skid floor wax.  Do  not have throw rugs and other things on the floor that can make you trip. What can I do with my stairs?  Do not leave any items on the stairs.  Make sure that there are handrails on both sides of the stairs and use them. Fix handrails that are broken or loose. Make sure that handrails are as long as the stairways.  Check any carpeting to make sure that it is firmly attached to the stairs. Fix any carpet that is loose or worn.  Avoid having throw rugs at the top or bottom of the stairs. If you do have throw rugs, attach them to the floor with carpet tape.  Make sure that you have a light switch at the top of the stairs and the bottom of the stairs. If you do not have them, ask someone to add them for  you. What else can I do to help prevent falls?  Wear shoes that: ? Do not have high heels. ? Have rubber bottoms. ? Are comfortable and fit you well. ? Are closed at the toe. Do not wear sandals.  If you use a stepladder: ? Make sure that it is fully opened. Do not climb a closed stepladder. ? Make sure that both sides of the stepladder are locked into place. ? Ask someone to hold it for you, if possible.  Clearly mark and make sure that you can see: ? Any grab bars or handrails. ? First and last steps. ? Where the edge of each step is.  Use tools that help you move around (mobility aids) if they are needed. These include: ? Canes. ? Walkers. ? Scooters. ? Crutches.  Turn on the lights when you go into a dark area. Replace any light bulbs as soon as they burn out.  Set up your furniture so you have a clear path. Avoid moving your furniture around.  If any of your floors are uneven, fix them.  If there are any pets around you, be aware of where they are.  Review your medicines with your doctor. Some medicines can make you feel dizzy. This can increase your chance of falling. Ask your doctor what other things that you can do to help prevent falls. This information is not intended to replace advice given to you by your health care provider. Make sure you discuss any questions you have with your health care provider. Document Released: 05/08/2009 Document Revised: 12/18/2015 Document Reviewed: 08/16/2014 Elsevier Interactive Patient Education  [employer].

## 2018-05-16 NOTE — Progress Notes (Signed)
[facility] [address] [place]. [place] [address] [phone]       OFFICE FOLLOW-UP NOTE  [name] Date of Birth:  15-Oct-1940 Medical Record Number:  [id number]   HPI: [name] is a pleasant 77 year old Caucasian lady seen today for the first office follow-up visit following hospital admission for stroke in August 2019.  She is accompanied by her daughter-in-law today.  History is obtained from them and review of electronic medical records.  I have personally reviewed imaging films.  She has past medical history of hypertension, diabetes, hyperlipidemia, right breast cancer who presented to [facility] emergency room as a code stroke for left-sided weakness and left facial droop.  She went to bed on 03/23/2018 feeling all right and she woke up at 2 in the morning the next day she noticed unsteady gait.  At 7 in the morning she noticed some slurred speech and left facial droop as well as left-sided weakness as well.  She presented beyond time window for TPA.  NIH stroke scale on admission was 3.  Patient had a serum creatinine of 200 and CT angiogram was not done on admission.  MR angiogram of the brain showed mild distal small vessel disease only without large vessel stenosis or occlusion.  MRI scan of the brain showed acute nonhemorrhagic 12 mm right paramedian pontine infarct as well as scattered changes of small vessel disease.  Transthoracic echo showed normal ejection fraction without cardiac source of embolism.  LDL cholesterol could not be calculated due to significantly elevated triglycerides of _0 mg percent.  Hemoglobin A1c was elevated 8.6.  Patient was started on dual antiplatelet therapy aspirin and Plavix.  She has seen by physical occupational and speech therapy and felt to be a good candidate for inpatient rehab.  Patient states she is finished rehab she is currently at home.  She uses a hemiwalker to walk.  She has a aide who comes in for 3 hours a  day.  She needs to help with only his activities like cooking and washing the floor but can do most activities for herself.  She is seeing Dr. Charleen Knapp and rehabilitation clinic for outpatient follow-up.  She still has some weakness and stiffness on the left side and incoordination but is able to walk fairly well and has had no major falls.  She has had no injuries. ROS:   14 system review of systems is positive for leg cramps, anxiety, decreased energy, eye pain, gait and balance difficulties, falls and all other systems negative  PMH:  Past Medical History:  Diagnosis Date  . Anxiety   . Back ache   . Breast cancer ([place])    right  . Bronchitis   . Cancer of colon ([place])   . Depression   . DM (diabetes mellitus) ([place])   . Dyspnea on exertion   . Fall on steps    age 55 feel down iron stairs couldnot move leg for 6 mths  . Family history of primary TB   . Fracture of ankle, closed 2014   left  . History of chicken pox   . History of hysterectomy 04/1980  . History of primary TB   . Hyperlipidemia   . Hypertension   . Kidney disease, chronic, stage III (moderate, EGFR 30-59 ml/min) ([place]) 2017  . Kidney disorder    reduction function  . Kidney stones   . Knee pain, right   . Lung abnormality    age 22  patient had pna spot on lungs   . Microalbuminuria   . Muscle cramps    both legs  . Muscular degeneration    of right eye  . Nephrolithiasis   . Palpitations   . Personal history of radiation therapy   . Stroke [facility])     Social History:  Social History   Socioeconomic History  . Marital status: Divorced    Spouse name: Not on file  . Number of children: 2  . Years of education: Not on file  . Highest education level: Not on file  Occupational History  . Not on file  Social Needs  . Financial resource strain: Not on file  . Food insecurity:    Worry: Not on file    Inability: Not on file  . Transportation needs:    Medical: Not on file    Non-medical: Not on  file  Tobacco Use  . Smoking status: Never Smoker  . Smokeless tobacco: Never Used  Substance and Sexual Activity  . Alcohol use: No  . Drug use: No  . Sexual activity: Not Currently  Lifestyle  . Physical activity:    Days per week: Not on file    Minutes per session: Not on file  . Stress: Not on file  Relationships  . Social connections:    Talks on phone: Not on file    Gets together: Not on file    Attends religious service: Not on file    Active member of club or organization: Not on file    Attends meetings of clubs or organizations: Not on file    Relationship status: Not on file  . Intimate partner violence:    Fear of current or ex partner: Not on file    Emotionally abused: Not on file    Physically abused: Not on file    Forced sexual activity: Not on file  Other Topics Concern  . Not on file  Social History Narrative  . Not on file    Medications:   Current Outpatient Medications on File Prior to Visit  Medication Sig Dispense Refill  . acetaminophen (TYLENOL) 325 MG tablet Take 2 tablets (650 mg total) by mouth every 4 (four) hours as needed for mild pain (or temp > 37.5 C (99.5 F)).    Marland Kitchen ARIPiprazole (ABILIFY) 5 MG tablet Take 1 tablet (5 mg total) by mouth daily. 30 tablet 3  . atorvastatin (LIPITOR) 80 MG tablet Take 1 tablet (80 mg total) by mouth daily at 6 PM. 30 tablet 0  . clopidogrel (PLAVIX) 75 MG tablet Take 1 tablet (75 mg total) by mouth daily. 30 tablet 0  . docusate sodium (COLACE) 100 MG capsule Take 100 mg by mouth 2 (two) times daily.    . Insulin Lispro Prot & Lispro (HUMALOG MIX 75/25 [place]) Inject into the skin.    Marland Kitchen lisinopril (PRINIVIL,ZESTRIL) 10 MG tablet Take 10 mg by mouth daily.    Marland Kitchen LORazepam (ATIVAN) 0.5 MG tablet Take 1 tablet (0.5 mg total) by mouth 4 (four) times daily. 60 tablet 0  . mirtazapine (REMERON) 15 MG tablet Take 1 tablet (15 mg total) by mouth at bedtime. 30 tablet 1  . multivitamin-lutein (OCUVITE-LUTEIN) CAPS  capsule Take 2 capsules by mouth daily.    Marland Kitchen omega-3 acid ethyl esters (LOVAZA) 1 g capsule Take 1 capsule (1 g total) by mouth 2 (two) times daily. 60 capsule 0  . senna-docusate (SENOKOT-S) 8.6-50 MG tablet Take 2 tablets by  mouth 2 (two) times daily.    Marland Kitchen venlafaxine XR (EFFEXOR-XR) 75 MG 24 hr capsule Take 1 capsule (75 mg total) by mouth every other day. (Patient taking differently: Take 150 mg by mouth daily. ) 30 capsule 0   No current facility-administered medications on file prior to visit.     Allergies:  No Known Allergies  Physical Exam General: frail elderly Caucasian lady seated, in no evident distress Head: head normocephalic and atraumatic.  Neck: supple with no carotid or supraclavicular bruits Cardiovascular: regular rate and rhythm, no murmurs Musculoskeletal: no deformity Skin:  no rash/petichiae Vascular:  Normal pulses all extremities Vitals:   05/16/18 1113  BP: 127/88  Pulse: 98   Neurologic Exam Mental Status: Awake and fully alert. Oriented to place and time. Recent and remote memory intact. Attention span, concentration and fund of knowledge appropriate. Mood and affect appropriate.  Cranial Nerves: Fundoscopic exam reveals sharp disc margins. Pupils equal, briskly reactive to light. Extraocular movements full without nystagmus. Visual fields full to confrontation. Hearing intact. Facial sensation intact. Mild left lower face asymmetry tongue, palate moves normally and symmetrically.  Motor: Normal bulk and tone. Normal strength in all tested extremity muscles.except diminished fine finger movements on left and orbits right over left UE.  Tone is increased on the left side with mild spasticity of the left leg. Sensory.: intact to touch ,pinprick .position and vibratory sensation.  Coordination: mild left finger to nose and knee to heel dysmetria Gait and Station: Arises from chair without difficulty. Stance is normal. Gait demonstrates mild ataxia and  spasticity and stiffness in the left side. Not able to heel, toe and tandem walk without difficulty.  Reflexes: 1+ and symmetric. Toes downgoing.   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 Vascular risk factors of DM, HT hyperlipidimia and age     PLAN: I had a long d/w patient and her daughter in law  about her recent Pontine stroke, risk for recurrent stroke/TIAs, personally independently reviewed imaging studies and stroke evaluation results and answered questions.Continue  Plavix 75 mg daily and stop aspirin now  for secondary stroke prevention and maintain strict control of hypertension with blood pressure goal below 130/90, diabetes with hemoglobin A1c goal below 6.5% and lipids with LDL cholesterol goal below 70 mg/dL. I also advised the patient to eat a healthy diet with plenty of whole grains, cereals, fruits and vegetables, exercise regularly and maintain ideal body weight . I advised her to sue a cane or walker at all times and we discussed fall safety precautions.Followup in the future with my nurse practitioner Janett Billow in 6 months or call earlier if necessary. Greater than 50% of time during this 25 minute visit was spent on counseling,explanation of diagnosis of lacunar stroke, planning of further management, discussion with patient and family and coordination of care Makayla Contras, MD  [facility] [address] [place], [place] [address]  Phone [phone] Fax [phone] Note: This document was prepared with digital dictation and possible smart phrase technology. Any transcriptional errors that result from this process are unintentional

## 2018-05-19 ENCOUNTER — Telehealth: Payer: Self-pay | Admitting: *Deleted

## 2018-05-19 DIAGNOSIS — I69354 Hemiplegia and hemiparesis following cerebral infarction affecting left non-dominant side: Secondary | ICD-10-CM

## 2018-05-19 DIAGNOSIS — I6389 Other cerebral infarction: Secondary | ICD-10-CM

## 2018-05-19 NOTE — Telephone Encounter (Signed)
Leroy Kennedy, PT, [facility] states that patient is being discharged from [facility].  Recommends patient begin outpatient therapies at neurorehab

## 2018-05-19 NOTE — Telephone Encounter (Signed)
Referral placed to outpt neuro rehab PT.

## 2018-05-22 ENCOUNTER — Encounter: Payer: Self-pay | Admitting: Physical Medicine & Rehabilitation

## 2018-05-22 ENCOUNTER — Ambulatory Visit: Payer: Medicare Other | Admitting: Physical Medicine & Rehabilitation

## 2018-05-22 VITALS — BP 137/87 | HR 105 | Resp 14 | Ht 67.5 in | Wt 183.0 lb

## 2018-05-22 DIAGNOSIS — I639 Cerebral infarction, unspecified: Secondary | ICD-10-CM | POA: Diagnosis not present

## 2018-05-22 DIAGNOSIS — I69354 Hemiplegia and hemiparesis following cerebral infarction affecting left non-dominant side: Secondary | ICD-10-CM

## 2018-05-22 NOTE — Progress Notes (Signed)
Subjective:    Patient ID: [name], female    DOB: August 06, 1940, 77 y.o.   MRN: [id number] DISCHARGE DATE:  04/12/2018 77 year old right-handed female with history of tachycardia, status post ablation in 2007, diabetes mellitus, CKD stage III.  Lives alone, independent prior to admission.  Family provides Doctor, hospital.  A 1-level home.   Presented 03/24/2018 with left-sided weakness, facial droop, slurred speech.  Cranial CT scan negative.  The patient did receive tPA.  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  No significant proximal  stenosis, aneurysm or branch vessel occlusion.  Echocardiogram with ejection fraction of 54%, grade I diastolic dysfunction.  Carotid Dopplers negative for ICA stenosis.  Neurology consulted.  Placed on aspirin and Plavix therapy.  Subcutaneous heparin  for DVT prophylaxis.  The patient was admitted for comprehensive rehabilitation program. HPI    "walking away from walker"  No falls Completed HH PT, OT Light meal prep   Pain Inventory Average Pain 0 Pain Right Now 0 My pain is no pain  In the last 24 hours, has pain interfered with the following? General activity 0 Relation with others 0 Enjoyment of life 0 What TIME of day is your pain at its worst? no pain Sleep (in general) Fair  Pain is worse with: no pain Pain improves with: no pain Relief from Meds: no pain  Mobility walk with assistance use a walker  Function retired  Neuro/Psych trouble walking  Prior Studies Any changes since last visit?  no  Physicians involved in your care Any changes since last visit?  no   Family History  Problem Relation Age of Onset  . Peripheral Artery Disease Mother 43       HX OF POOR CIRCULATION, SMOKING, LEG AMPUTATION  . Cancer Father        cancer of spine  . Asthma Brother 61  . Kidney Stones Brother   . Breast cancer Neg Hx    Social History   Socioeconomic History  .  Marital status: Divorced    Spouse name: Not on file  . Number of children: 2  . Years of education: Not on file  . Highest education level: Not on file  Occupational History  . Not on file  Social Needs  . Financial resource strain: Not on file  . Food insecurity:    Worry: Not on file    Inability: Not on file  . Transportation needs:    Medical: Not on file    Non-medical: Not on file  Tobacco Use  . Smoking status: Never Smoker  . Smokeless tobacco: Never Used  Substance and Sexual Activity  . Alcohol use: No  . Drug use: No  . Sexual activity: Not Currently  Lifestyle  . Physical activity:    Days per week: Not on file    Minutes per session: Not on file  . Stress: Not on file  Relationships  . Social connections:    Talks on phone: Not on file    Gets together: Not on file    Attends religious service: Not on file    Active member of club or organization: Not on file    Attends meetings of clubs or organizations: Not on file    Relationship status: Not on file  Other Topics Concern  . Not on file  Social History Narrative  . Not on file   Past Surgical History:  Procedure Laterality Date  . ABLATION  2007  fast heart rate  . ANKLE FRACTURE SURGERY Left 2014  . BREAST LUMPECTOMY Right   . CATARACT EXTRACTION Right 2017  . colonscopy  2015  . HYSTEROTOMY     45 years ago  . TONSILLECTOMY AND ADENOIDECTOMY     age 32   Past Medical History:  Diagnosis Date  . Anxiety   . Back ache   . Breast cancer ([place])    right  . Bronchitis   . Cancer of colon ([place])   . Depression   . DM (diabetes mellitus) ([place])   . Dyspnea on exertion   . Fall on steps    age 79 feel down iron stairs couldnot move leg for 6 mths  . Family history of primary TB   . Fracture of ankle, closed 2014   left  . History of chicken pox   . History of hysterectomy 04/1980  . History of primary TB   . Hyperlipidemia   . Hypertension   . Kidney disease, chronic, stage III  (moderate, EGFR 30-59 ml/min) ([place]) 2017  . Kidney disorder    reduction function  . Kidney stones   . Knee pain, right   . Lung abnormality    age 77 patient had pna spot on lungs   . Microalbuminuria   . Muscle cramps    both legs  . Muscular degeneration    of right eye  . Nephrolithiasis   . Palpitations   . Personal history of radiation therapy   . Stroke ([place])    BP 137/87   Pulse (!) 105   Resp 14   Ht 5' 7.5" (1.715 m)   Wt 183 lb (83 kg)   SpO2 97%   BMI 28.24 kg/m   Opioid Risk Score:   Fall Risk Score:  `1  Depression screen PHQ 2/9  Depression screen PHQ 2/9 04/25/2018  Decreased Interest 0  Down, Depressed, Hopeless 0  PHQ - 2 Score 0  Altered sleeping 0  Tired, decreased energy 1  Change in appetite 0  Feeling bad or failure about yourself  0  Trouble concentrating 1  Moving slowly or fidgety/restless 0  Suicidal thoughts 0  PHQ-9 Score 2  Difficult doing work/chores Somewhat difficult    Review of Systems  Constitutional: Negative.   HENT: Negative.   Eyes: Negative.   Respiratory: Negative.   Cardiovascular: Negative.   Gastrointestinal: Negative.   Endocrine: Negative.   Genitourinary: Negative.   Musculoskeletal: Positive for gait problem.  Skin: Negative.   Allergic/Immunologic: Negative.   Hematological: Negative.   Psychiatric/Behavioral: Negative.   All other systems reviewed and are negative.      Objective:   Physical Exam  Constitutional: She is oriented to person, place, and time. She appears well-developed and well-nourished. No distress.  HENT:  Head: Normocephalic and atraumatic.  Eyes: Pupils are equal, round, and reactive to light. EOM are normal.  Neck: Normal range of motion.  Musculoskeletal: Normal range of motion.  Neurological: She is alert and oriented to person, place, and time.  Skin: Skin is warm and dry. She is not diaphoretic.  Psychiatric: She has a normal mood and affect.  Nursing note and vitals  reviewed.  5/5 bilateral deltoid, bicep, tricep, grip, hip flexor, knee extensor right ankle dorsiflexor, left ankle dorsiflexor is 4/5 Sensation intact light touch in bilateral upper and lower limbs Gait wide-based support short step length decreased arm swing no evidence of toe drag or knee instability, patient needs standby assistance without  her cane but is able to be modified independent with cane  Romberg + falls to left slightly after ~30s     Assessment & Plan:  1.  Right paramedian pontine infarct she has some residual left ankle dorsiflexor weakness otherwise excellent improvement in her strength.  She still has mild balance disorder.  Continue to ambulate with a cane. Transition outpatient PT OT, follow-up in 6 weeks physical medicine rehabilitation.  Patient will continue follow-up with her primary care doctor for secondary stroke prophylaxis as well as follow-up with neurology

## 2018-05-22 NOTE — Patient Instructions (Signed)
No Driving °

## 2018-05-23 ENCOUNTER — Ambulatory Visit: Payer: Medicare Other | Admitting: Physician Assistant

## 2018-05-23 ENCOUNTER — Encounter: Payer: Self-pay | Admitting: Physician Assistant

## 2018-05-23 VITALS — BP 112/72 | HR 110 | Ht 67.5 in | Wt 181.4 lb

## 2018-05-23 DIAGNOSIS — E782 Mixed hyperlipidemia: Secondary | ICD-10-CM

## 2018-05-23 DIAGNOSIS — E118 Type 2 diabetes mellitus with unspecified complications: Secondary | ICD-10-CM

## 2018-05-23 DIAGNOSIS — I10 Essential (primary) hypertension: Secondary | ICD-10-CM

## 2018-05-23 DIAGNOSIS — R000 Tachycardia, unspecified: Secondary | ICD-10-CM

## 2018-05-23 NOTE — Patient Instructions (Addendum)
Medication Instructions:  Your physician recommends that you continue on your current medications as directed. Please refer to the Current Medication list given to you today.  If you need a refill on your cardiac medications before your next appointment, please call your pharmacy.   Lab work: None ordered  If you have labs (blood work) drawn today and your tests are completely normal, you will receive your results only by: Marland Kitchen MyChart Message (if you have MyChart) OR . A paper copy in the mail If you have any lab test that is abnormal or we need to change your treatment, we will call you to review the results.  Testing/Procedures: None ordered  Follow-Up:Any Other Special Instructions Will Be Listed Below (If Applicable). Your physician recommends that you schedule a follow-up appointment in: [place] DR. ARCHRYA AS NEW PT (DR. END'S O[facility] PT)

## 2018-05-23 NOTE — Progress Notes (Signed)
Cardiology Office Note    Date:  05/23/2018   ID:  [name], DOB 01-15-1941, MRN [id number]  PCP:  Crist Infante, MD  Cardiologist:  Dr. Saunders Revel (will be follow by Dr. Margaretann Loveless)   Chief Complaint: 6 Months follow up  History of Present Illness:   [name] is a 77 y.o. female with history of tachycardia status post ablation in 2007, hypertension, hyperlipidemia, type 2 diabetes mellitus, chronic kidney disease stage III,breast cancer status post right lumpectomy and radiation, and colon cancer presents for follow up.   TTE (06/14/17): Normal LV size.  LVEF 65-70% with normal wall motion.  Grade 1 diastolic dysfunction.  Trileaflet aortic valve with mild thickening.  No stenosis or regurgitation.  Trivial MR, TR, and PR.  Normal RV size and function.  Normal PA pressure.  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 Echocardiogram with ejection fraction of 07%, grade I diastolic dysfunction.  Carotid Dopplers negative for ICA stenosis. Patient was started on dual antiplatelet therapy aspirin and Plavix x 3 months. Currently on Plavix only.   Here today for follow-up with granddaughter.  No concern.  Doing home health PT OT.  Denies chest pain, shortness of breath, palpitation, orthopnea, PND, syncope, lower extremity edema or melena.  Past Medical History:  Diagnosis Date  . Anxiety   . Back ache   . Breast cancer ([place])    right  . Bronchitis   . Cancer of colon ([place])   . Depression   . DM (diabetes mellitus) ([place])   . Dyspnea on exertion   . Fall on steps    age 24 feel down iron stairs couldnot move leg for 6 mths  . Family history of primary TB   . Fracture of ankle, closed 2014   left  . History of chicken pox   . History of hysterectomy 04/1980  . History of primary TB   . Hyperlipidemia   . Hypertension   . Kidney disease, chronic, stage III (moderate, EGFR 30-59 ml/min) ([place]) 2017  . Kidney disorder    reduction function  . Kidney stones   . Knee pain, right   . Lung  abnormality    age 77 patient had pna spot on lungs   . Microalbuminuria   . Muscle cramps    both legs  . Muscular degeneration    of right eye  . Nephrolithiasis   . Palpitations   . Personal history of radiation therapy   . Stroke [facility])     Past Surgical History:  Procedure Laterality Date  . ABLATION  2007   fast heart rate  . ANKLE FRACTURE SURGERY Left 2014  . BREAST LUMPECTOMY Right   . CATARACT EXTRACTION Right 2017  . colonscopy  2015  . HYSTEROTOMY     45 years ago  . TONSILLECTOMY AND ADENOIDECTOMY     age 38    Current Medications: Prior to Admission medications   Medication Sig Start Date End Date Taking? Authorizing Provider  acetaminophen (TYLENOL) 325 MG tablet Take 2 tablets (650 mg total) by mouth every 4 (four) hours as needed for mild pain (or temp > 37.5 C (99.5 F)). 04/12/18  Yes Angiulli, Lavon Paganini, PA-C  ARIPiprazole (ABILIFY) 5 MG tablet Take 1 tablet (5 mg total) by mouth daily. 04/12/18  Yes Angiulli, Lavon Paganini, PA-C  atorvastatin (LIPITOR) 80 MG tablet Take 1 tablet (80 mg total) by mouth daily at 6 PM. 04/12/18  Yes Angiulli, Lavon Paganini, PA-C  clopidogrel (PLAVIX) 75 MG  tablet Take 1 tablet (75 mg total) by mouth daily. 04/12/18  Yes Angiulli, Lavon Paganini, PA-C  docusate sodium (COLACE) 100 MG capsule Take 100 mg by mouth 2 (two) times daily.   Yes [provider]  Insulin Lispro Prot & Lispro (HUMALOG MIX 75/25 [place]) Inject into the skin. INJECT 20U WITH BREAKFAST AND SUPPER. INCREASE AS DIRCTED IN CLINIC.   Yes [provider]  lisinopril (PRINIVIL,ZESTRIL) 10 MG tablet Take 10 mg by mouth daily.   Yes [provider]  LORazepam (ATIVAN) 0.5 MG tablet Take 1 tablet (0.5 mg total) by mouth 4 (four) times daily. 04/12/18  Yes Angiulli, Lavon Paganini, PA-C  mirtazapine (REMERON) 15 MG tablet Take 1 tablet (15 mg total) by mouth at bedtime. 04/12/18  Yes Angiulli, Lavon Paganini, PA-C  Multiple Vitamin (MULTIVITAMIN) capsule Take 1 capsule by mouth  daily.   Yes [provider]  multivitamin-lutein (OCUVITE-LUTEIN) CAPS capsule Take 2 capsules by mouth daily.   Yes [provider]  omega-3 acid ethyl esters (LOVAZA) 1 g capsule Take 1 capsule (1 g total) by mouth 2 (two) times daily. 04/12/18  Yes Angiulli, Lavon Paganini, PA-C  sennosides-docusate sodium (SENOKOT-S) 8.6-50 MG tablet Take 1 tablet by mouth daily.   Yes [provider]  venlafaxine XR (EFFEXOR-XR) 150 MG 24 hr capsule Take 1 capsule by mouth daily. 05/12/18  Yes [provider]    Allergies:   Patient has no known allergies.   Social History   Socioeconomic History  . Marital status: Divorced    Spouse name: Not on file  . Number of children: 2  . Years of education: Not on file  . Highest education level: Not on file  Occupational History  . Not on file  Social Needs  . Financial resource strain: Not on file  . Food insecurity:    Worry: Not on file    Inability: Not on file  . Transportation needs:    Medical: Not on file    Non-medical: Not on file  Tobacco Use  . Smoking status: Never Smoker  . Smokeless tobacco: Never Used  Substance and Sexual Activity  . Alcohol use: No  . Drug use: No  . Sexual activity: Not Currently  Lifestyle  . Physical activity:    Days per week: Not on file    Minutes per session: Not on file  . Stress: Not on file  Relationships  . Social connections:    Talks on phone: Not on file    Gets together: Not on file    Attends religious service: Not on file    Active member of club or organization: Not on file    Attends meetings of clubs or organizations: Not on file    Relationship status: Not on file  Other Topics Concern  . Not on file  Social History Narrative  . Not on file     Family History:  The patient's family history includes Asthma (age of onset: 27) in her brother; Cancer in her father; Kidney Stones in her brother; Peripheral Artery Disease (age of onset: 38) in her mother.    ROS:   Please see the history of present illness.    ROS All other systems reviewed and are negative.   PHYSICAL EXAM:   VS:  BP 112/72   Pulse (!) 110   Ht 5' 7.5" (1.715 m)   Wt 181 lb 6.4 oz (82.3 kg)   SpO2 96%   BMI 27.99 kg/m  GEN: Well nourished, well developed, in no acute distress  HEENT: normal  Neck: no JVD, carotid bruits, or masses Cardiac: Regular tachycardic; no murmurs, rubs, or gallops,no edema  Respiratory:  clear to auscultation bilaterally, normal work of breathing GI: soft, nontender, nondistended, + BS MS: no deformity or atrophy  Skin: warm and dry, no rash Neuro:  Alert and Oriented x 3, Strength and sensation are intact Psych: euthymic mood, full affect  Wt Readings from Last 3 Encounters:  05/23/18 181 lb 6.4 oz (82.3 kg)  05/22/18 183 lb (83 kg)  05/16/18 183 lb 12.8 oz (83.4 kg)      Studies/Labs Reviewed:   EKG:  EKG is ordered today.  The ekg ordered today demonstrates sinus rhythm at rate of 97 bpm  Recent Labs: 03/29/2018: ALT 46; Hemoglobin 11.9; Platelets 207 04/10/2018: BUN 37; Creatinine, Ser 1.97; Potassium 4.5; Sodium 139   Lipid Panel    Component Value Date/Time   CHOL 189 03/25/2018 0247   TRIG 578 (H) 03/25/2018 0247   HDL 25 (L) 03/25/2018 0247   CHOLHDL 7.6 03/25/2018 0247   VLDL UNABLE TO CALCULATE IF TRIGLYCERIDE OVER 400 mg/dL 03/25/2018 0247   LDLCALC UNABLE TO CALCULATE IF TRIGLYCERIDE OVER 400 mg/dL 03/25/2018 0247    Additional studies/ records that were reviewed today include:   EP Procedures and Devices:  Ablation (2007, [place]): Tachycardia ablation; patient does not recall further details  ASSESSMENT & PLAN:    1. Sinus tachycardia Initial heart rate of 110 upon arrival which improved to 97 with rest.  Likely due to exertional activity given deconditioning from recent stroke.  She denies palpitation or syncope.  She does have a history of prior ablation in 2007.  No detailed information  available. -Recent echocardiogram with normal LV function.  Discussed addition of low-dose metoprolol however patient and granddaughter wants to wait for now.  Discussed patient's heart rate might go up with PT OT, this should come back with rest.  Advised to keep eye on heart rate.  If persistently elevated or symptomatic (reviewed symptoms in detail), they will call us for further direction.  2.  Hypertension -Blood pressure stable on lisinopril.  3.  Recent CVA -Per neurology.  On Plavix.  4.  Diabetes -Per PCP  5.  Hyperlipidemia -On statin  Patient will follow-up with Dr. Margaretann Loveless at [facility] office.  Medication Adjustments/Labs and Tests Ordered: Current medicines are reviewed at length with the patient today.  Concerns regarding medicines are outlined above.  Medication changes, Labs and Tests ordered today are listed in the Patient Instructions below. Patient Instructions  Medication Instructions:  Your physician recommends that you continue on your current medications as directed. Please refer to the Current Medication list given to you today.  If you need a refill on your cardiac medications before your next appointment, please call your pharmacy.   Lab work: None ordered  If you have labs (blood work) drawn today and your tests are completely normal, you will receive your results only by: Marland Kitchen MyChart Message (if you have MyChart) OR . A paper copy in the mail If you have any lab test that is abnormal or we need to change your treatment, we will call you to review the results.  Testing/Procedures: None ordered  Follow-Up:Any Other Special Instructions Will Be Listed Below (If Applicable). Your physician recommends that you schedule a follow-up appointment in: [place] DR. ARCHRYA AS NEW PT ([facility] PT)  Jarrett Soho, [place]  05/23/2018 11:00 AM    [address], [place], [place]  [address] Phone:  [phone]; Fax: [phone]

## 2018-05-24 ENCOUNTER — Ambulatory Visit
Admission: RE | Admit: 2018-05-24 | Discharge: 2018-05-24 | Disposition: A | Discharge status: Home / Self Care | Payer: Medicare Other | Source: Ambulatory Visit | Attending: Internal Medicine | Admitting: Internal Medicine

## 2018-05-24 DIAGNOSIS — Z1231 Encounter for screening mammogram for malignant neoplasm of breast: Secondary | ICD-10-CM

## 2018-05-25 ENCOUNTER — Other Ambulatory Visit: Payer: Self-pay | Admitting: Internal Medicine

## 2018-05-25 DIAGNOSIS — R928 Other abnormal and inconclusive findings on diagnostic imaging of breast: Secondary | ICD-10-CM

## 2018-06-07 ENCOUNTER — Ambulatory Visit: Payer: Medicare Other | Attending: Physical Medicine & Rehabilitation | Admitting: Physical Therapy

## 2018-06-07 ENCOUNTER — Other Ambulatory Visit: Payer: Self-pay

## 2018-06-07 ENCOUNTER — Encounter: Payer: Self-pay | Admitting: Physical Therapy

## 2018-06-07 VITALS — BP 125/89 | HR 85

## 2018-06-07 DIAGNOSIS — R293 Abnormal posture: Secondary | ICD-10-CM | POA: Diagnosis not present

## 2018-06-07 DIAGNOSIS — R269 Unspecified abnormalities of gait and mobility: Secondary | ICD-10-CM

## 2018-06-07 DIAGNOSIS — R2681 Unsteadiness on feet: Secondary | ICD-10-CM | POA: Diagnosis present

## 2018-06-07 DIAGNOSIS — M6281 Muscle weakness (generalized): Secondary | ICD-10-CM | POA: Insufficient documentation

## 2018-06-07 NOTE — Therapy (Signed)
[facility] [address] [place], [place], [address] Phone: [phone]   Fax:  [phone]  Physical Therapy Evaluation  Patient Details  Name: [name] MRN: [id number] Date of Birth: 08-06-1940 Referring Provider (PT): Dr. Alysia Penna   Encounter Date: 06/07/2018  PT End of Session - 06/07/18 1238    Visit Number  1    Number of Visits  17    Date for PT Re-Evaluation  08/06/18    Authorization Type  [employer] Medicare-* requires 10th visit PN    PT Start Time  0930    PT Stop Time  1015    PT Time Calculation (min)  45 min    Activity Tolerance  Patient tolerated treatment well    Behavior During Therapy  [facility] for tasks assessed/performed       Past Medical History:  Diagnosis Date  . Anxiety   . Back ache   . Breast cancer ([place])    right  . Bronchitis   . Cancer of colon ([place])   . Depression   . DM (diabetes mellitus) ([place])   . Dyspnea on exertion   . Fall on steps    age 44 feel down iron stairs couldnot move leg for 6 mths  . Family history of primary TB   . Fracture of ankle, closed 2014   left  . History of chicken pox   . History of hysterectomy 04/1980  . History of primary TB   . Hyperlipidemia   . Hypertension   . Kidney disease, chronic, stage III (moderate, EGFR 30-59 ml/min) ([place]) 2017  . Kidney disorder    reduction function  . Kidney stones   . Knee pain, right   . Lung abnormality    age 16 patient had pna spot on lungs   . Microalbuminuria   . Muscle cramps    both legs  . Muscular degeneration    of right eye  . Nephrolithiasis   . Palpitations   . Personal history of radiation therapy   . Stroke [facility])     Past Surgical History:  Procedure Laterality Date  . ABLATION  2007   fast heart rate  . ANKLE FRACTURE SURGERY Left 2014  . BREAST LUMPECTOMY Right   . CATARACT EXTRACTION Right 2017  . colonscopy  2015  . HYSTEROTOMY     45 years ago  . TONSILLECTOMY AND  ADENOIDECTOMY     age 4    Vitals:   06/07/18 1225 06/07/18 1226  BP: 125/89   Pulse: 85 85     Subjective Assessment - 06/07/18 0939    Subjective  Pt reports she was previously using a RW for ambulation and now walks into the clinic with her cane. Daughter in law reports she had home health physical therapy s/p d/c from CIR. Pt reports finishing home health PT couple weeks prior to her evaluation today. Reports she is able to perform stairs with her cane and holds on to her HR. Reports prior to her stroke she was driving and did everything independently. Daughter in law reports Pt still unsteady when walking. Reports no recent falls. Reports last night she had "goosebumps" to L side of body with no known origin.     Patient is accompained by:  Family member   Daughter in law: [name]   Pertinent History  CVA, Small vessel disease, DM type 2 in nonobese, Stage 3 CKD, HLD, hx of R Breast cancer (TAKE BP ON  L SIDE), Colon cancer, TIA, Dyspnea on exertion, Essential HTN, Sinus Tachycardia, Anxiety and depression    Limitations  Lifting;Standing;Walking    How long can you walk comfortably?  Reports she stands to cook, perform laundry, take out garbage and is currently only ambulating around house. She performs walking around neighborhood with her son for 20 min durations with cane.     Patient Stated Goals  Reports goals of driving her car again, walking more with her cane, and return to PLOF.     Currently in Pain?  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 Alysia Penna    Onset Date/Surgical Date  05/22/18    Hand Dominance  Right    Prior Therapy  CIR s/p stroke (8/19) with HHPT s/p d/c from CIR      Precautions   Precautions  Fall   R breast cancer; TAKE BP ON L SIDE   Precaution Comments  CVA, Small vessel disease, DM type  2 in nonobese, Stage 3 CKD, HLD, hx of R Breast cancer (TAKE BP ON L SIDE), Colon cancer, TIA, Dyspnea on exertion, Essential HTN, Sinus Tachycardia, Anxiety and depression      Restrictions   Weight Bearing Restrictions  No      Balance Screen   Has the patient fallen in the past 6 months  No    Has the patient had a decrease in activity level because of a fear of falling?   Yes    Is the patient reluctant to leave their home because of a fear of falling?   Yes      [place]  Private residence    Living Arrangements  Alone    Available Help at Discharge  Family    Type of Home  Other(Comment)   [place] to enter    Entrance Stairs-Number of Steps  3    Entrance Stairs-Rails  Can reach both    [place]  One level    [place] - 2 wheels;Cane - single point;Grab bars - toilet;Shower seat      Prior Function   Level of Independence  Independent    Vocation  Retired    Leisure  Going out with her friends to dinners and going shopping      Cognition   Overall Cognitive Status  Within Functional Limits for tasks assessed      Observation/Other Assessments   Focus on Therapeutic Outcomes (FOTO)   not performed      Sensation   Light Touch  Appears Intact      Coordination   Gross Motor Movements are Fluid and Coordinated  No    Heel Shin Test  R=L WNL      Posture/Postural Control   Posture/Postural Control  Postural limitations    Postural Limitations  Rounded Shoulders;Forward head;Increased thoracic kyphosis;Flexed trunk;Weight shift right   dec weight shift to L in standing     ROM / Strength   AROM / PROM / Strength  Strength;AROM      AROM   Overall AROM   Within functional limits for tasks performed      Strength   Overall Strength  Deficits  Overall Strength Comments  Strength assessed in sitting via MMT. L LE strength 4+/5 overall when grossly assessed; R LE strength grossly assesed to be 4/5  with exception to R hip flexor and R hamstring both 4-/5      Transfers   Transfers  Sit to Stand;Stand to Sit;Stand Pivot Transfers    Sit to Stand  5: Supervision    Five time sit to stand comments   18.31 seconds from standard height chair with no UE assist indicating high fall risk potential    Stand to Sit  5: Supervision    Stand Pivot Transfers  5: Supervision   with no UE assist from standard height chair     Ambulation/Gait   Ambulation/Gait  Yes    Ambulation/Gait Assistance  5: Supervision    Ambulation/Gait Assistance Details  Pt performs x2 ambulation trials for 40 ft with her SPC and with no AD. Pt demonstrates increased guarding when ambulating with her SPC in comparison to ambulating with no AD    Ambulation Distance (Feet)  40 Feet   x2 trials   Assistive device  Straight cane;None    Gait Pattern  Step-through pattern;Decreased arm swing - left;Decreased step length - left;Decreased stride length;Decreased dorsiflexion - left;Decreased weight shift to left;Decreased trunk rotation;Trunk flexed    Ambulation Surface  Level;Indoor    Gait velocity  1.87 ft/sec with SPC indicative of limited community ambulator; 2.45 ft/sec with no AD indicative of limited community ambulator      Standardized Balance Assessment   Standardized Balance Assessment  Berg Balance Test;10 meter walk test;Five Times Sit to Stand    Five times sit to stand comments   18.31 seconds from standard height chair with no UE assist indicating high fall risk potential    10 Meter Walk  1.87 ft/sec with SPC indicative of limited community ambulator; 2.45 ft/sec with no AD indicative of limited community ambulator      Furniture conservator/restorer   Sit to Stand  Able to stand without using hands and stabilize independently    Standing Unsupported  Able to stand safely 2 minutes    Sitting with Back Unsupported but Feet Supported on Floor or Stool  Able to sit safely and securely 2 minutes    Stand to Sit  Sits  safely with minimal use of hands    Transfers  Able to transfer safely, minor use of hands    Standing Unsupported with Eyes Closed  Able to stand 10 seconds with supervision    Standing Ubsupported with Feet Together  Able to place feet together independently and stand for 1 minute with supervision    From Standing, Reach Forward with Outstretched Arm  Can reach confidently >25 cm (10")    From Standing Position, Pick up Object from Floor  Able to pick up shoe, needs supervision    From Standing Position, Turn to Look Behind Over each Shoulder  Turn sideways only but maintains balance    Turn 360 Degrees  Able to turn 360 degrees safely but slowly    Standing Unsupported, Alternately Place Feet on Step/Stool  Able to complete >2 steps/needs minimal assist    Standing Unsupported, One Foot in Front  Able to plae foot ahead of the other independently and hold 30 seconds    Standing on One Leg  Tries to lift leg/unable to hold 3 seconds but remains standing independently    Total Score  42    Berg comment:  42/56  indicating significant fall risk potential                 Objective measurements completed on examination: See above findings.              PT Education - 06/07/18 1237    Education Details  Therapist provided education regarding clinical findings from initial evaluation assessments and POC moving forward with physical therapy.     Person(s) Educated  Patient;Child(ren)   daughter in law- [name]   Methods  Explanation    Comprehension  Verbalized understanding       PT Short Term Goals - 06/07/18 1240      PT SHORT TERM GOAL #1   Title  Patient will participate in the establishment of an initial HEP to improve strength, balance, and functional mobility     Time  4    Period  Weeks    Status  New    Target Date  07/07/18      PT SHORT TERM GOAL #2   Title  Pt will improve 5xSTS to </=15 seconds from standard height chair with no UE assist to reduce fall  risk potential and demonstrate improvement in LE strength     Baseline  11/13: 18.31 seconds from standard height chair with no UE assist     Time  4    Period  Weeks    Status  New    Target Date  07/07/18      PT SHORT TERM GOAL #3   Title  Pt will improve gait speed with her SPC to >/= 2.17 ft/sec and with no AD to >/= 2.79 ft/sec to improve safety and independence with functional mobility     Baseline  11/13: 1.87 ft/sec with her SPC; 2.49 ft/sec with no AD- Both indicative of limited community ambulation    Time  4    Period  Weeks    Status  New    Target Date  07/07/18      PT SHORT TERM GOAL #4   Title  Pt will improve her Berg score to >/= 46/56 indicating reduction in fall risk potential     Baseline  11/13: 42/56 indicative of significant fall risk potential     Time  4    Period  Weeks    Status  New    Target Date  07/07/18      PT SHORT TERM GOAL #5   Title  Pt will ambulate 300 feet in clinic using her [facility] navigating obstacles, curb, ramp and perform 4 steps utilizing step to stair pattern with cane and single HR with therapist providing CGA to improve independence with functional mobility    Baseline  11/13: pt reports she primarily performs household ambulation with her cane and would like to be able to ambulate in the community again with friends without fear of falling     Time  4    Period  Weeks    Status  New    Target Date  07/07/18      Additional Short Term Goals   Additional Short Term Goals  Yes      PT SHORT TERM GOAL #6   Title  Pt will participate in FGA with no AD with establishment of appropriate LTG    Time  4    Period  Weeks    Status  New    Target Date  07/07/18        PT Long Term  Goals - 06/07/18 1245      PT LONG TERM GOAL #1   Title  Patient will compliance and independence with HEP to improve strength, balance, and functional mobility     Time  8    Period  Weeks    Status  New    Target Date  08/06/18      PT LONG TERM  GOAL #2   Title  Pt will improve 5xSTS to </=13 seconds from standard height chair with no UE assist to reduce fall risk potential and demonstrate improvement in LE strength     Time  8    Period  Weeks    Status  New    Target Date  08/06/18      PT LONG TERM GOAL #3   Title  Pt will improve gait speed with her SPC to >/= 2.47 ft/sec and with no AD to >/= 3.09 ft/sec to improve safety and independence with functional mobility     Time  8    Period  Weeks    Status  New    Target Date  08/06/18      PT LONG TERM GOAL #4   Title  Pt will improve her Berg score to >/= 50/56 indicating reduction in fall risk potential     Time  8    Period  Weeks    Status  New    Target Date  08/06/18      PT LONG TERM GOAL #5   Title  Pt will ambulate 500 feet outdoors with her [place] navigating obstacles, uneven terrain, curb, ramp and perform 8 steps utilizing reciprocal stepping pattern with cane and single HR with therapist providing supervision to improve independence with community accessibility     Time  8    Period  Weeks    Status  New    Target Date  08/06/18      Additional Long Term Goals   Additional Long Term Goals  Yes      PT LONG TERM GOAL #6   Title  Pt will improve FGA score to >19/30 indicating reduction in fall risk potential during dynamic gait    Time  8    Period  Weeks    Status  New    Target Date  08/06/18             Plan - 06/07/18 1250    Clinical Impression Statement  Pt is a 77 year old female referred to Neuro OPPT for evaluation of functional mobility s/p CVA with L sided weakness. Pt's PMH is significant for the following: CVA (02/2018), Small vessel disease, DM type 2 in nonobese, Stage 3 CKD, HLD, hx of R Breast cancer, Colon cancer, TIA, Dyspnea on exertion, Essential HTN, Sinus Tachycardia, and Anxiety. During initial assessment, patient demonstrates significant fall risk potential indicated by Berg score of 42/56 and 5xSTS time of 18.31 seconds. Pt's  gait speed with her SPC was 1.87 ft/sec and 2.49 ft/sec when assessed with no AD both indicative of limited community ambulator. Pt strength grossly assessed in sitting via MMT with increased weakness L LE>RLE. Pt's sensation and coordination appear to be intact when assessed with therapist noting mild L sided inattention and will further assess next session. Pt would benefit from skilled PT to address these impairments and functional limitations to maximize functional mobility independence and reduce falls risk.     History and Personal Factors relevant to plan of care:   CVA,  Small vessel disease, DM type 2 in nonobese, Stage 3 CKD, HLD, hx of R Breast cancer, Colon cancer, TIA, Dyspnea on exertion, Essential HTN, Sinus Tachycardia, Anxiety. Pt lives alone and relies on family for transportation 2/2 to inability to drive since stroke 03/4584. Pt reports increased difficulty performing steps if there are no HR's present limiting community accessibility.  Fatigues quickly.    Clinical Presentation  Evolving    Clinical Presentation due to:   CVA, Small vessel disease, DM type 2 in nonobese, Stage 3 CKD, HLD, hx of R Breast cancer, Colon cancer, TIA, Dyspnea on exertion, Essential HTN, Sinus Tachycardia, Anxiety. Pt lives alone and relies on family for transportation 2/2 to inability to drive since stroke 03/2923. Pt reports increased difficulty performing steps if there are no HR's present limiting community accessibility.  Fatigues quickly.    Clinical Decision Making  Moderate    Rehab Potential  Good    Clinical Impairments Affecting Rehab Potential  relies on family for transportation. Pt reports she has high anxiety when ambulating in clinic and is taking medication for this.     PT Frequency  2x / week    PT Duration  8 weeks    PT Treatment/Interventions  ADLs/Self Care Home Management;Balance training;Neuromuscular re-education;DME Instruction;Gait training;Patient/family education;Stair  training;Electrical Stimulation;Functional mobility training;Therapeutic activities;Therapeutic exercise;Passive range of motion;Aquatic Therapy    PT Next Visit Plan   TAKE BP ON L SIDE 2/2 TO HX OF R BREAST CANCER** FGA with LTG to follow and establish HEP    PT Home Exercise Plan  tbd    Recommended Other Services  Patient has OT evaluation on 11/14    Consulted and Agree with Plan of Care  Patient;Family member/caregiver    Family Member Consulted  Daughter in law: [name]       Patient will benefit from skilled therapeutic intervention in order to improve the following deficits and impairments:  Abnormal gait, Decreased strength, Decreased balance, Decreased mobility, Postural dysfunction, Decreased activity tolerance, Decreased endurance, Difficulty walking  Visit Diagnosis: Abnormal posture  Muscle weakness (generalized)  Unsteadiness on feet  Abnormality of gait and mobility     Problem List Patient Active Problem List   Diagnosis Date Noted  . Labile blood pressure   . Elevated blood pressure reading   . CKD (chronic kidney disease), stage III ([place])   . Diabetes mellitus type 2 in obese ([place])   . Small vessel disease ([place]) 03/28/2018  . Diabetes mellitus type 2 in nonobese ([facility])   . Anxiety state   . Stage 3 chronic kidney disease ([place])   . Hyperlipidemia   . History of breast cancer   . Acute ischemic stroke ([place])   . TIA (transient ischemic attack) 03/24/2018  . Dyspnea on exertion 05/30/2017  . Sinus tachycardia 05/30/2017  . Mixed hyperlipidemia 05/30/2017  . Essential hypertension 05/30/2017    Floreen Comber, SPT 06/07/2018, 1:02 PM  [facility] [address] [place], [place], [address] Phone: [phone]   Fax:  [phone]  Name: [name] MRN: [id number] Date of Birth: 1941-04-30

## 2018-06-08 ENCOUNTER — Ambulatory Visit: Payer: Medicare Other | Admitting: Occupational Therapy

## 2018-06-08 ENCOUNTER — Other Ambulatory Visit: Payer: Self-pay

## 2018-06-08 ENCOUNTER — Encounter: Payer: Self-pay | Admitting: Occupational Therapy

## 2018-06-08 DIAGNOSIS — M6281 Muscle weakness (generalized): Secondary | ICD-10-CM

## 2018-06-08 DIAGNOSIS — R293 Abnormal posture: Secondary | ICD-10-CM | POA: Diagnosis not present

## 2018-06-08 NOTE — Therapy (Signed)
[facility] [address] [place], [place], [address] Phone: [phone]   Fax:  [phone]  Occupational Therapy Evaluation  Patient Details  Name: Charlese Gruetzmacher MRN: [id number] Date of Birth: 1940-09-29 Referring Provider (OT): Dr. Jacobo Forest   Encounter Date: 06/08/2018  OT End of Session - 06/08/18 1427    Visit Number  1    Number of Visits  3    Date for OT Re-Evaluation  07/06/18    Authorization Type  medicare - will need PN every 10th visit if POC changes    Authorization - Visit Number  1    Authorization - Number of Visits  10    OT Start Time  3295    OT Stop Time  1400    OT Time Calculation (min)  43 min    Activity Tolerance  Patient tolerated treatment well       Past Medical History:  Diagnosis Date  . Anxiety   . Back ache   . Breast cancer ([place])    right  . Bronchitis   . Cancer of colon ([place])   . Depression   . DM (diabetes mellitus) ([place])   . Dyspnea on exertion   . Fall on steps    age 50 feel down iron stairs couldnot move leg for 6 mths  . Family history of primary TB   . Fracture of ankle, closed 2014   left  . History of chicken pox   . History of hysterectomy 04/1980  . History of primary TB   . Hyperlipidemia   . Hypertension   . Kidney disease, chronic, stage III (moderate, EGFR 30-59 ml/min) ([place]) 2017  . Kidney disorder    reduction function  . Kidney stones   . Knee pain, right   . Lung abnormality    age 22 patient had pna spot on lungs   . Microalbuminuria   . Muscle cramps    both legs  . Muscular degeneration    of right eye  . Nephrolithiasis   . Palpitations   . Personal history of radiation therapy   . Stroke [facility])     Past Surgical History:  Procedure Laterality Date  . ABLATION  2007   fast heart rate  . ANKLE FRACTURE SURGERY Left 2014  . BREAST LUMPECTOMY Right   . CATARACT EXTRACTION Right 2017  . colonscopy  2015  . HYSTEROTOMY     45 years ago  . TONSILLECTOMY AND ADENOIDECTOMY     age 36    There were no vitals filed for this visit.  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  Pt d/c home on 04/11/2018 after inpt rehab stay.  HHPT and OT completed approximately 2-3 weeks.  PMH:      Patient Stated Goals  I feel like my balance is my biggest problem.  I want to do all the things I used to.      Currently in Pain?  No/denies        [facility] OT Assessment - 06/08/18 0001      Assessment   Medical Diagnosis  R pontine CVA    Referring Provider (OT)  Dr. Jacobo Forest    Onset Date/Surgical Date  03/24/18    Hand Dominance  Right    Prior Therapy  inpt rehab PT, OT and  ST HHPT and OT      Precautions   Precautions  Fall;Other (comment)    Precaution Comments  H/o of breast cancer take BP in L UE      Restrictions   Weight Bearing Restrictions  No      Balance Screen   Has the patient fallen in the past 6 months  No   pt currently seeing PT     Home  Environment   Family/patient expects to be discharged to:  Private residence    Living Arrangements  Alone    Available Help at Discharge  Available PRN/intermittently    Type of Home  Other (Comment)   town [place]  One level    Bathroom Shower/Tub  Tub/Shower unit    Bathroom Toilet  Handicapped height   also has standard   Additional Comments  Pt has transfer tub bench, grab bars in the shower, toilet next to counter so she can use that prn      Prior Function   Level of Independence  Independent    Vocation  Retired    Leisure  Go out to dinner and shopping, visit with my kids and grandchildren (twin girls 56 and boy 45)      ADL   Eating/Feeding  Independent    Grooming  Independent    Upper Body Bathing  Modified independent    Lower Body Bathing  Modified independent    Upper Body Dressing  Independent   extra time, can do  buttons and tying   Lower Art therapist  Modified independent    [place] Transfer  Modified independent      IADL   Shopping  Assistance for transportation;Completely unable to shop   pt has not done any shopping    Light Housekeeping  Maintains house alone or with occasional assistance   pt to hire someone to do heavier work 1x/month   Meal Prep  Plans, prepares and serves adequate meals independently    Chief Operating Officer on family or friends for transportation   pt wishes to return to driving   Medication Management  Takes responsibility if medication is prepared in advance in seperate dosage    Financial Management  Requires assistance   dtr in law was helping before the stroke     Mobility   Mobility Status  Needs assist    Mobility Status Comments  supervision for community ambulation. Pt uses cane inside and outside      Written Expression   Dominant Hand  Right      Vision - History   Baseline Vision  Wears glasses only for reading    Visual History  Macular degeneration   very mild   Additional Comments  Pt denies any visual changes from stroke      Activity Tolerance   Activity Tolerance  Tolerate 30+ min activity without fatigue      Cognition   Overall Cognitive Status  Within Functional Limits for tasks assessed      Posture/Postural Control   Posture/Postural Control  Postural limitations    Postural Limitations  Forward head;Rounded Shoulders;Increased thoracic kyphosis;Flexed trunk;Weight shift right      Sensation   Light Touch  Appears Intact    Hot/Cold  Appears Intact    Proprioception  Appears Intact  Coordination   Gross Motor Movements are Fluid and Coordinated  Yes    Finger Nose Finger Test  WFL's      Tone   Assessment Location  Left Upper Extremity      ROM / Strength   AROM / PROM / Strength   AROM;Strength      AROM   Overall AROM   Within functional limits for tasks performed      Strength   Overall Strength  Deficits    Overall Strength Comments  LUE 4+/5 for abduction and shoulder flexion;  decreased grip strength see below.        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  Pt discharged home on 04/11/2018 after inpt rehab for PT, OT and ST. Pt then had HHPT and OT.  Pt presents today with the following deficits that impact independence and quality of life for pt:  decrease balance, decreased functional mobility, decreased activity tolerance, decreased LUE strength, decrease L grip strength. Pt will benefit from short course of OT to address UE strengthening as PT willl be addressing balance, mobility and activity tolerance.  Pt in agreement with plan.     Occupational Profile and client history currently impacting functional performance  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  PMH: small vessel disease, DM, peripheral neuropathy, h/o tachycardia with ablation in 2007, chronic kidney disease Stage III, h/o breast cancer, HTN, HLD, dyspnea with exertion.     Occupational performance deficits (Please refer to evaluation for details):  IADL's;Leisure;Social Participation    Rehab Potential  Good    OT Frequency  1x / week    OT Duration  2 weeks    OT Treatment/Interventions  Self-care/ADL  training;Therapeutic exercise;Therapeutic activities;Patient/family education    Plan  PT to address balance, mobility and activity tolerance.  HEP for UE strength - check on putty HEP given during first session.     Clinical Decision Making  Limited treatment options, no task modification necessary    Consulted and Agree with Plan of Care  Patient;Family member/caregiver    Family Member Consulted  dtr in law Wendi       Patient will benefit from skilled therapeutic intervention in order to improve the following deficits and impairments:  Decreased strength  Visit Diagnosis: Muscle weakness (generalized) - Plan: Ot plan of care cert/re-cert    Problem List Patient Active Problem List   Diagnosis Date Noted  . Labile blood pressure   . Elevated blood pressure reading   . CKD (chronic kidney disease), stage III ([place])   .  Diabetes mellitus type 2 in obese ([place])   . Small vessel disease ([place]) 03/28/2018  . Diabetes mellitus type 2 in nonobese ([facility])   . Anxiety state   . Stage 3 chronic kidney disease ([place])   . Hyperlipidemia   . History of breast cancer   . Acute ischemic stroke ([place])   . TIA (transient ischemic attack) 03/24/2018  . Dyspnea on exertion 05/30/2017  . Sinus tachycardia 05/30/2017  . Mixed hyperlipidemia 05/30/2017  . Essential hypertension 05/30/2017    Quay Burow, OTR/L 06/08/2018, 2:32 PM  [facility] [address] [place], [place], [address] Phone: [phone]   Fax:  [phone]  Name: Bev Drennen MRN: [id number] Date of Birth: 01/18/1941

## 2018-06-08 NOTE — Patient Instructions (Signed)
1. Grip Strengthening (Resistive Putty) Red putty.  Make sure you roll it into a fat hot dog before EACH squeeze. Use your left hand to roll it and squeeze it.  Wear an oven mitt on your right hand if you can't remember to not use it!!   Squeeze putty using thumb and all fingers. Repeat _20___ times. Do __2__ sessions per day.

## 2018-06-13 ENCOUNTER — Ambulatory Visit: Payer: Medicare Other | Admitting: Rehabilitation

## 2018-06-13 ENCOUNTER — Ambulatory Visit: Payer: Medicare Other | Admitting: Occupational Therapy

## 2018-06-13 ENCOUNTER — Encounter: Payer: Self-pay | Admitting: Rehabilitation

## 2018-06-13 DIAGNOSIS — R2681 Unsteadiness on feet: Secondary | ICD-10-CM

## 2018-06-13 DIAGNOSIS — R269 Unspecified abnormalities of gait and mobility: Secondary | ICD-10-CM

## 2018-06-13 DIAGNOSIS — M6281 Muscle weakness (generalized): Secondary | ICD-10-CM

## 2018-06-13 DIAGNOSIS — R293 Abnormal posture: Secondary | ICD-10-CM | POA: Diagnosis not present

## 2018-06-13 NOTE — Patient Instructions (Signed)
Access Code: [email or web address]  URL: [email or web address]  Date: 06/13/2018  Prepared by: Cameron Sprang   Exercises  Sit to Stand without Arm Support - 10 reps - 1 sets - 2x daily - 7x weekly  Tandem Walking with Counter Support - 4 reps - 1 sets - 1x daily - 7x weekly  Backward Walking with Counter Support - 4 reps - 1 sets - 2x daily - 7x weekly  Walking March - 10 reps - 3 sets - 2x daily - 7x weekly  Side Stepping with Resistance at Thighs - 4 reps - 1 sets - 2x daily - 7x weekly  Wide Stance with Eyes Closed on Foam Pad - 3 reps - 1 sets - 20 hold - 2x daily - 7x weekly  Romberg Stance on Foam Pad - 3 reps - 1 sets - 20 hold - 2x daily - 7x weekly

## 2018-06-13 NOTE — Therapy (Signed)
[facility] [address] [place], [place], [address] Phone: [phone]   Fax:  [phone]  Physical Therapy Treatment  Patient Details  Name: [name] MRN: [id number] Date of Birth: 10-Feb-1941 Referring Provider (PT): Dr. Alysia Penna   Encounter Date: 06/13/2018  PT End of Session - 06/13/18 1252    Visit Number  2    Number of Visits  17    Date for PT Re-Evaluation  08/06/18    Authorization Type  [employer] Medicare-* requires 10th visit PN    PT Start Time  0930    PT Stop Time  1016    PT Time Calculation (min)  46 min    Activity Tolerance  Patient tolerated treatment well    Behavior During Therapy  [facility] for tasks assessed/performed       Past Medical History:  Diagnosis Date  . Anxiety   . Back ache   . Breast cancer ([place])    right  . Bronchitis   . Cancer of colon ([place])   . Depression   . DM (diabetes mellitus) ([place])   . Dyspnea on exertion   . Fall on steps    age 52 feel down iron stairs couldnot move leg for 6 mths  . Family history of primary TB   . Fracture of ankle, closed 2014   left  . History of chicken pox   . History of hysterectomy 04/1980  . History of primary TB   . Hyperlipidemia   . Hypertension   . Kidney disease, chronic, stage III (moderate, EGFR 30-59 ml/min) ([place]) 2017  . Kidney disorder    reduction function  . Kidney stones   . Knee pain, right   . Lung abnormality    age 74 patient had pna spot on lungs   . Microalbuminuria   . Muscle cramps    both legs  . Muscular degeneration    of right eye  . Nephrolithiasis   . Palpitations   . Personal history of radiation therapy   . Stroke [facility])     Past Surgical History:  Procedure Laterality Date  . ABLATION  2007   fast heart rate  . ANKLE FRACTURE SURGERY Left 2014  . BREAST LUMPECTOMY Right   . CATARACT EXTRACTION Right 2017  . colonscopy  2015  . HYSTEROTOMY     45 years ago  . TONSILLECTOMY AND  ADENOIDECTOMY     age 70    There were no vitals filed for this visit.  Subjective Assessment - 06/13/18 1248    Subjective  Pt reports needing to cancel OT visit today as daughter in law drove her and she is having refrigerator delivered to home at 29.  OT notified and pt to reshedule later in day.     Pertinent History  CVA, Small vessel disease, DM type 2 in nonobese, Stage 70 CKD, HLD, hx of R Breast cancer (TAKE BP ON L SIDE), Colon cancer, TIA, Dyspnea on exertion, Essential HTN, Sinus Tachycardia, Anxiety and depression    Limitations  Lifting;Standing;Walking    How long can you walk comfortably?  Reports she stands to cook, perform laundry, take out garbage and is currently only ambulating around house. She performs walking around neighborhood with her son for 20 min durations with cane.     Patient Stated Goals  Reports goals of driving her car again, walking more with her cane, and return to PLOF.  [facility] PT Assessment - 06/13/18 0944      Functional Gait  Assessment   Gait assessed   Yes    Gait Level Surface  Walks 20 ft in less than 7 sec but greater than 5.5 sec, uses assistive device, slower speed, mild gait deviations, or deviates 6-10 in outside of the 12 in walkway width.    Change in Gait Speed  Able to change speed, demonstrates mild gait deviations, deviates 6-10 in outside of the 12 in walkway width, or no gait deviations, unable to achieve a major change in velocity, or uses a change in velocity, or uses an assistive device.    Gait with Horizontal Head Turns  Performs head turns smoothly with slight change in gait velocity (eg, minor disruption to smooth gait path), deviates 6-10 in outside 12 in walkway width, or uses an assistive device.    Gait with Vertical Head Turns  Performs head turns with no change in gait. Deviates no more than 6 in outside 12 in walkway width.    Gait and Pivot Turn  Pivot turns safely in greater than 3 sec and stops with no loss of  balance, or pivot turns safely within 3 sec and stops with mild imbalance, requires small steps to catch balance.    Step Over Obstacle  Is able to step over one shoe box (4.5 in total height) without changing gait speed. No evidence of imbalance.    Gait with Narrow Base of Support  Ambulates less than 4 steps heel to toe or cannot perform without assistance.    Gait with Eyes Closed  Walks 20 ft, uses assistive device, slower speed, mild gait deviations, deviates 6-10 in outside 12 in walkway width. Ambulates 20 ft in less than 9 sec but greater than 7 sec.    Ambulating Backwards  Walks 20 ft, uses assistive device, slower speed, mild gait deviations, deviates 6-10 in outside 12 in walkway width.    Steps  Alternating feet, must use rail.    Total Score  19    FGA comment:  19-24 = medium risk fall                Access Code: [email or web address]  URL: [email or web address]  Date: 06/13/2018  Prepared by: Cameron Sprang   Exercises  Sit to Stand without Arm Support - 10 reps - 1 sets - 2x daily - 7x weekly  Tandem Walking with Counter Support - 4 reps - 1 sets - 1x daily - 7x weekly  Backward Walking with Counter Support - 4 reps - 1 sets - 2x daily - 7x weekly  Walking March - 10 reps - 3 sets - 2x daily - 7x weekly  Side Stepping with Resistance at Thighs - 4 reps - 1 sets - 2x daily - 7x weekly  Wide Stance with Eyes Closed on Foam Pad - 3 reps - 1 sets - 20 hold - 2x daily - 7x weekly  Romberg Stance on Foam Pad - 3 reps - 1 sets - 20 hold - 2x daily - 7x weekly             PT Education - 06/13/18 1250    Education Details  Educated on FGA results, HEP for balance/strengthening.     Person(s) Educated  Patient    Methods  Explanation;Demonstration;Handout    Comprehension  Verbalized understanding;Returned demonstration       PT Short Term Goals - 06/07/18 1240      PT SHORT  TERM GOAL #1   Title  Patient will participate in the establishment of an initial  HEP to improve strength, balance, and functional mobility     Time  4    Period  Weeks    Status  New    Target Date  07/07/18      PT SHORT TERM GOAL #2   Title  Pt will improve 5xSTS to </=15 seconds from standard height chair with no UE assist to reduce fall risk potential and demonstrate improvement in LE strength     Baseline  11/13: 18.31 seconds from standard height chair with no UE assist     Time  4    Period  Weeks    Status  New    Target Date  07/07/18      PT SHORT TERM GOAL #3   Title  Pt will improve gait speed with her SPC to >/= 2.17 ft/sec and with no AD to >/= 2.79 ft/sec to improve safety and independence with functional mobility     Baseline  11/13: 1.87 ft/sec with her SPC; 2.49 ft/sec with no AD- Both indicative of limited community ambulation    Time  4    Period  Weeks    Status  New    Target Date  07/07/18      PT SHORT TERM GOAL #4   Title  Pt will improve her Berg score to >/= 46/56 indicating reduction in fall risk potential     Baseline  11/13: 42/56 indicative of significant fall risk potential     Time  4    Period  Weeks    Status  New    Target Date  07/07/18      PT SHORT TERM GOAL #5   Title  Pt will ambulate 300 feet in clinic using her [facility] navigating obstacles, curb, ramp and perform 4 steps utilizing step to stair pattern with cane and single HR with therapist providing CGA to improve independence with functional mobility    Baseline  11/13: pt reports she primarily performs household ambulation with her cane and would like to be able to ambulate in the community again with friends without fear of falling     Time  4    Period  Weeks    Status  New    Target Date  07/07/18      Additional Short Term Goals   Additional Short Term Goals  Yes      PT SHORT TERM GOAL #6   Title  Pt will participate in FGA with no AD with establishment of appropriate LTG    Time  4    Period  Weeks    Status  New    Target Date  07/07/18        PT  Long Term Goals - 06/07/18 1245      PT LONG TERM GOAL #1   Title  Patient will compliance and independence with HEP to improve strength, balance, and functional mobility     Time  8    Period  Weeks    Status  New    Target Date  08/06/18      PT LONG TERM GOAL #2   Title  Pt will improve 5xSTS to </=13 seconds from standard height chair with no UE assist to reduce fall risk potential and demonstrate improvement in LE strength     Time  8    Period  Weeks    Status  New    Target Date  08/06/18      PT LONG TERM GOAL #3   Title  Pt will improve gait speed with her SPC to >/= 2.47 ft/sec and with no AD to >/= 3.09 ft/sec to improve safety and independence with functional mobility     Time  8    Period  Weeks    Status  New    Target Date  08/06/18      PT LONG TERM GOAL #4   Title  Pt will improve her Berg score to >/= 50/56 indicating reduction in fall risk potential     Time  8    Period  Weeks    Status  New    Target Date  08/06/18      PT LONG TERM GOAL #5   Title  Pt will ambulate 500 feet outdoors with her [place] navigating obstacles, uneven terrain, curb, ramp and perform 8 steps utilizing reciprocal stepping pattern with cane and single HR with therapist providing supervision to improve independence with community accessibility     Time  8    Period  Weeks    Status  New    Target Date  08/06/18      Additional Long Term Goals   Additional Long Term Goals  Yes      PT LONG TERM GOAL #6   Title  Pt will improve FGA score to >19/30 indicating reduction in fall risk potential during dynamic gait    Time  8    Period  Weeks    Status  New    Target Date  08/06/18            Plan - 06/13/18 1254    Clinical Impression Statement  Skilled session focused on formal balance assessment with FGA.  Note score of 19/30 indicative of medium fall risk.  Pt with difficulty with tandem gait and backwards walking, therefore added these plus BLE strength and corner balance  to HEP.  Pt tolerated all well.      Rehab Potential  Good    Clinical Impairments Affecting Rehab Potential  relies on family for transportation. Pt reports she has high anxiety when ambulating in clinic and is taking medication for this.     PT Frequency  2x / week    PT Duration  8 weeks    PT Treatment/Interventions  ADLs/Self Care Home Management;Balance training;Neuromuscular re-education;DME Instruction;Gait training;Patient/family education;Stair training;Electrical Stimulation;Functional mobility training;Therapeutic activities;Therapeutic exercise;Passive range of motion;Aquatic Therapy    PT Next Visit Plan   TAKE BP ON L SIDE 2/2 TO HX OF R BREAST CANCER** Ask her about corner balance (we did on airex which may have been more difficult that what she has at home).  Continue high level balance with emphasis on stepping strategy, SLS, compliant surfaces.     PT Home Exercise Plan  tbd    Consulted and Agree with Plan of Care  Patient       Patient will benefit from skilled therapeutic intervention in order to improve the following deficits and impairments:  Abnormal gait, Decreased strength, Decreased balance, Decreased mobility, Postural dysfunction, Decreased activity tolerance, Decreased endurance, Difficulty walking  Visit Diagnosis: Muscle weakness (generalized)  Unsteadiness on feet  Abnormality of gait and mobility     Problem List Patient Active Problem List   Diagnosis Date Noted  . Labile blood pressure   . Elevated blood pressure reading   . CKD (chronic kidney disease), stage III (  [place])   . Diabetes mellitus type 2 in obese ([place])   . Small vessel disease ([place]) 03/28/2018  . Diabetes mellitus type 2 in nonobese ([facility])   . Anxiety state   . Stage 3 chronic kidney disease ([place])   . Hyperlipidemia   . History of breast cancer   . Acute ischemic stroke ([place])   . TIA (transient ischemic attack) 03/24/2018  . Dyspnea on exertion 05/30/2017  . Sinus tachycardia  05/30/2017  . Mixed hyperlipidemia 05/30/2017  . Essential hypertension 05/30/2017    Cameron Sprang, PT, MPT [facility] [address] [place], [place], [address] Phone: [phone]   Fax:  [phone] 06/13/18, 12:57 PM  Name: [name] MRN: [id number] Date of Birth: Jan 04, 1941

## 2018-06-16 ENCOUNTER — Ambulatory Visit: Payer: Medicare Other | Admitting: Physical Therapy

## 2018-06-16 ENCOUNTER — Encounter: Payer: Self-pay | Admitting: Physical Therapy

## 2018-06-16 VITALS — BP 140/88

## 2018-06-16 DIAGNOSIS — R2681 Unsteadiness on feet: Secondary | ICD-10-CM

## 2018-06-16 DIAGNOSIS — R269 Unspecified abnormalities of gait and mobility: Secondary | ICD-10-CM

## 2018-06-16 DIAGNOSIS — R293 Abnormal posture: Secondary | ICD-10-CM

## 2018-06-16 DIAGNOSIS — M6281 Muscle weakness (generalized): Secondary | ICD-10-CM

## 2018-06-16 NOTE — Patient Instructions (Addendum)
Access Code: [email or web address]  URL: [email or web address]  Date: 06/16/2018  Prepared by: Misty Stanley   Exercises  Sit to Stand without Arm Support - 10 reps - 1 sets - 2x daily - 7x weekly  Tandem Walking with Counter Support - 4 reps - 1 sets - 1x daily - 7x weekly  Backward Walking with Counter Support - 4 reps - 1 sets - 2x daily - 7x weekly  Walking March - 10 reps - 3 sets - 2x daily - 7x weekly  Side Stepping with Resistance at Thighs - 4 reps - 1 sets - 2x daily - 7x weekly  Standing Single Leg Stance with Counter Support - 3 sets - 10 seconds hold - 1x daily - 7x weekly  Romberg Stance Eyes Closed on Foam Pad - 3 sets - 10 seconds hold - 1x daily - 7x weekly  Heel Toe Raises with Counter Support - 10 reps - 2 sets - 1x daily - 7x weekly  Romberg Stance on Foam Pad with Head Rotation - 10 reps - 2 sets - 1x daily - 7x weekly  Romberg Stance with Head Nods on Foam Pad - 10 reps - 2 sets - 1x daily - 7x weekly  Standing Knee Flexion AROM with Chair Support - 10 reps - 2 sets - 1x daily - 7x weekly

## 2018-06-16 NOTE — Therapy (Signed)
[facility] [address] [place], [place], [address] Phone: [phone]   Fax:  [phone]  Physical Therapy Treatment  Patient Details  Name: [name] MRN: [id number] Date of Birth: 06-08-1941 Referring Provider (PT): Dr. Alysia Penna   Encounter Date: 06/16/2018  PT End of Session - 06/16/18 1152    Visit Number  3    Number of Visits  17    Date for PT Re-Evaluation  08/06/18    Authorization Type  [employer] Medicare-* requires 10th visit PN    PT Start Time  1105    PT Stop Time  1148    PT Time Calculation (min)  43 min    Activity Tolerance  Patient tolerated treatment well    Behavior During Therapy  [facility] for tasks assessed/performed       Past Medical History:  Diagnosis Date  . Anxiety   . Back ache   . Breast cancer ([place])    right  . Bronchitis   . Cancer of colon ([place])   . Depression   . DM (diabetes mellitus) ([place])   . Dyspnea on exertion   . Fall on steps    age 77 feel down iron stairs couldnot move leg for 6 mths  . Family history of primary TB   . Fracture of ankle, closed 2014   left  . History of chicken pox   . History of hysterectomy 04/1980  . History of primary TB   . Hyperlipidemia   . Hypertension   . Kidney disease, chronic, stage III (moderate, EGFR 30-59 ml/min) ([place]) 2017  . Kidney disorder    reduction function  . Kidney stones   . Knee pain, right   . Lung abnormality    age 77 patient had pna spot on lungs   . Microalbuminuria   . Muscle cramps    both legs  . Muscular degeneration    of right eye  . Nephrolithiasis   . Palpitations   . Personal history of radiation therapy   . Stroke [facility])     Past Surgical History:  Procedure Laterality Date  . ABLATION  2007   fast heart rate  . ANKLE FRACTURE SURGERY Left 2014  . BREAST LUMPECTOMY Right   . CATARACT EXTRACTION Right 2017  . colonscopy  2015  . HYSTEROTOMY     45 years ago  . TONSILLECTOMY AND  ADENOIDECTOMY     age 46    Vitals:   06/16/18 1115  BP: 140/88    Subjective Assessment - 06/16/18 1109    Subjective  Was sore after Tuesday's appointment.  Feeling a little better today but the soreness caused her to wobble a little bit.    Pertinent History  CVA, Small vessel disease, DM type 2 in nonobese, Stage 3 CKD, HLD, hx of R Breast cancer (TAKE BP ON L SIDE), Colon cancer, TIA, Dyspnea on exertion, Essential HTN, Sinus Tachycardia, Anxiety and depression    Limitations  Lifting;Standing;Walking    How long can you walk comfortably?  Reports she stands to cook, perform laundry, take out garbage and is currently only ambulating around house. She performs walking around neighborhood with her son for 20 min durations with cane.     Patient Stated Goals  Reports goals of driving her car again, walking more with her cane, and return to PLOF.     Currently in Pain?  Yes    Pain Location  Leg  Pain Orientation  Right;Left    Pain Descriptors / Indicators  Sore    Pain Type  Acute pain          Access Code: [email or web address]  URL: [email or web address]  Date: 06/16/2018  Prepared by: Misty Stanley   Exercises reviewed and added to HEP today:  Side Stepping with Resistance at Thighs - 4 reps - 1 sets - 2x daily - 7x weekly  Standing Single Leg Stance with Counter Support - 3 sets - 10 seconds hold - 1x daily - 7x weekly  Romberg Stance Eyes Closed on Foam Pad - 3 sets - 10 seconds hold - 1x daily - 7x weekly  Heel Toe Raises with Counter Support - 10 reps - 2 sets - 1x daily - 7x weekly  Romberg Stance on Foam Pad with Head Rotation - 10 reps - 2 sets - 1x daily - 7x weekly  Romberg Stance with Head Nods on Foam Pad - 10 reps - 2 sets - 1x daily - 7x weekly  Standing Knee Flexion AROM with Chair Support - 10 reps - 2 sets - 1x daily - 7x weekly                          PT Education - 06/16/18 1152    Education Details  updated HEP    Person(s)  Educated  Patient    Methods  Explanation;Demonstration;Handout    Comprehension  Verbalized understanding;Returned demonstration       PT Short Term Goals - 06/07/18 1240      PT SHORT TERM GOAL #1   Title  Patient will participate in the establishment of an initial HEP to improve strength, balance, and functional mobility     Time  4    Period  Weeks    Status  New    Target Date  07/07/18      PT SHORT TERM GOAL #2   Title  Pt will improve 5xSTS to </=15 seconds from standard height chair with no UE assist to reduce fall risk potential and demonstrate improvement in LE strength     Baseline  11/13: 18.31 seconds from standard height chair with no UE assist     Time  4    Period  Weeks    Status  New    Target Date  07/07/18      PT SHORT TERM GOAL #3   Title  Pt will improve gait speed with her SPC to >/= 2.17 ft/sec and with no AD to >/= 2.79 ft/sec to improve safety and independence with functional mobility     Baseline  11/13: 1.87 ft/sec with her SPC; 2.49 ft/sec with no AD- Both indicative of limited community ambulation    Time  4    Period  Weeks    Status  New    Target Date  07/07/18      PT SHORT TERM GOAL #4   Title  Pt will improve her Berg score to >/= 46/56 indicating reduction in fall risk potential     Baseline  11/13: 42/56 indicative of significant fall risk potential     Time  4    Period  Weeks    Status  New    Target Date  07/07/18      PT SHORT TERM GOAL #5   Title  Pt will ambulate 300 feet in clinic using her [facility] navigating obstacles, curb, ramp and perform 4 steps utilizing  step to stair pattern with cane and single HR with therapist providing CGA to improve independence with functional mobility    Baseline  11/13: pt reports she primarily performs household ambulation with her cane and would like to be able to ambulate in the community again with friends without fear of falling     Time  4    Period  Weeks    Status  New    Target Date   07/07/18      Additional Short Term Goals   Additional Short Term Goals  Yes      PT SHORT TERM GOAL #6   Title  Pt will participate in FGA with no AD with establishment of appropriate LTG    Time  4    Period  Weeks    Status  New    Target Date  07/07/18        PT Long Term Goals - 06/07/18 1245      PT LONG TERM GOAL #1   Title  Patient will compliance and independence with HEP to improve strength, balance, and functional mobility     Time  8    Period  Weeks    Status  New    Target Date  08/06/18      PT LONG TERM GOAL #2   Title  Pt will improve 5xSTS to </=13 seconds from standard height chair with no UE assist to reduce fall risk potential and demonstrate improvement in LE strength     Time  8    Period  Weeks    Status  New    Target Date  08/06/18      PT LONG TERM GOAL #3   Title  Pt will improve gait speed with her SPC to >/= 2.47 ft/sec and with no AD to >/= 3.09 ft/sec to improve safety and independence with functional mobility     Time  8    Period  Weeks    Status  New    Target Date  08/06/18      PT LONG TERM GOAL #4   Title  Pt will improve her Berg score to >/= 50/56 indicating reduction in fall risk potential     Time  8    Period  Weeks    Status  New    Target Date  08/06/18      PT LONG TERM GOAL #5   Title  Pt will ambulate 500 feet outdoors with her [place] navigating obstacles, uneven terrain, curb, ramp and perform 8 steps utilizing reciprocal stepping pattern with cane and single HR with therapist providing supervision to improve independence with community accessibility     Time  8    Period  Weeks    Status  New    Target Date  08/06/18      Additional Long Term Goals   Additional Long Term Goals  Yes      PT LONG TERM GOAL #6   Title  Pt will improve FGA score to >19/30 indicating reduction in fall risk potential during dynamic gait    Time  8    Period  Weeks    Status  New    Target Date  08/06/18            Plan -  06/16/18 1152    Clinical Impression Statement  Due to significant soreness and pt reporting difficulty with a few of the exercises given last session, session focused on review  and revision of HEP.  Added exercises to address ankle and hamstring weakness and single limb stance balance.  Progressed corner balance exercises.  Pt tolerated well; will continue to progress towards LTG.    Rehab Potential  Good    Clinical Impairments Affecting Rehab Potential  relies on family for transportation. Pt reports she has high anxiety when ambulating in clinic and is taking medication for this.     PT Frequency  2x / week    PT Duration  8 weeks    PT Treatment/Interventions  ADLs/Self Care Home Management;Balance training;Neuromuscular re-education;DME Instruction;Gait training;Patient/family education;Stair training;Electrical Stimulation;Functional mobility training;Therapeutic activities;Therapeutic exercise;Passive range of motion;Aquatic Therapy    PT Next Visit Plan   TAKE BP ON L SIDE 2/2 TO HX OF R BREAST CANCER**   Continue high level balance with emphasis on stepping strategy, SLS, compliant surfaces.     PT Home Exercise Plan  [phone]     Recommended Other Services  did she get her final OT session scheduled?    Consulted and Agree with Plan of Care  Patient       Patient will benefit from skilled therapeutic intervention in order to improve the following deficits and impairments:  Abnormal gait, Decreased strength, Decreased balance, Decreased mobility, Postural dysfunction, Decreased activity tolerance, Decreased endurance, Difficulty walking  Visit Diagnosis: Muscle weakness (generalized)  Unsteadiness on feet  Abnormality of gait and mobility  Abnormal posture     Problem List Patient Active Problem List   Diagnosis Date Noted  . Labile blood pressure   . Elevated blood pressure reading   . CKD (chronic kidney disease), stage III ([place])   . Diabetes mellitus type 2 in obese  ([place])   . Small vessel disease ([place]) 03/28/2018  . Diabetes mellitus type 2 in nonobese ([facility])   . Anxiety state   . Stage 3 chronic kidney disease ([place])   . Hyperlipidemia   . History of breast cancer   . Acute ischemic stroke ([place])   . TIA (transient ischemic attack) 03/24/2018  . Dyspnea on exertion 05/30/2017  . Sinus tachycardia 05/30/2017  . Mixed hyperlipidemia 05/30/2017  . Essential hypertension 05/30/2017    Rico Junker, PT, DPT 06/16/18    11:56 AM    [facility] [address] [place], [place], [address] Phone: [phone]   Fax:  [phone]  Name: Wrigley Winborne MRN: [id number] Date of Birth: 11-23-1940

## 2018-06-20 ENCOUNTER — Ambulatory Visit: Payer: Medicare Other | Admitting: Physical Therapy

## 2018-06-28 ENCOUNTER — Ambulatory Visit: Payer: Medicare Other | Admitting: Rehabilitative and Restorative Service Providers"

## 2018-06-30 ENCOUNTER — Ambulatory Visit: Payer: Medicare Other | Admitting: Rehabilitative and Restorative Service Providers"

## 2018-07-03 ENCOUNTER — Encounter: Payer: Medicare Other | Attending: Physical Medicine & Rehabilitation

## 2018-07-03 ENCOUNTER — Ambulatory Visit: Payer: Medicare Other | Admitting: Physical Medicine & Rehabilitation

## 2018-07-03 ENCOUNTER — Encounter: Payer: Self-pay | Admitting: Physical Medicine & Rehabilitation

## 2018-07-03 VITALS — BP 120/81 | HR 98 | Ht 67.5 in | Wt 184.0 lb

## 2018-07-03 DIAGNOSIS — I69354 Hemiplegia and hemiparesis following cerebral infarction affecting left non-dominant side: Secondary | ICD-10-CM | POA: Insufficient documentation

## 2018-07-03 DIAGNOSIS — F329 Major depressive disorder, single episode, unspecified: Secondary | ICD-10-CM | POA: Diagnosis not present

## 2018-07-03 DIAGNOSIS — E785 Hyperlipidemia, unspecified: Secondary | ICD-10-CM | POA: Insufficient documentation

## 2018-07-03 DIAGNOSIS — I129 Hypertensive chronic kidney disease with stage 1 through stage 4 chronic kidney disease, or unspecified chronic kidney disease: Secondary | ICD-10-CM | POA: Diagnosis not present

## 2018-07-03 DIAGNOSIS — Z853 Personal history of malignant neoplasm of breast: Secondary | ICD-10-CM | POA: Insufficient documentation

## 2018-07-03 DIAGNOSIS — I639 Cerebral infarction, unspecified: Secondary | ICD-10-CM | POA: Diagnosis not present

## 2018-07-03 DIAGNOSIS — N183 Chronic kidney disease, stage 3 (moderate): Secondary | ICD-10-CM | POA: Diagnosis not present

## 2018-07-03 DIAGNOSIS — R269 Unspecified abnormalities of gait and mobility: Secondary | ICD-10-CM | POA: Diagnosis not present

## 2018-07-03 DIAGNOSIS — E1122 Type 2 diabetes mellitus with diabetic chronic kidney disease: Secondary | ICD-10-CM | POA: Diagnosis not present

## 2018-07-03 DIAGNOSIS — I69398 Other sequelae of cerebral infarction: Secondary | ICD-10-CM | POA: Diagnosis not present

## 2018-07-03 DIAGNOSIS — Z923 Personal history of irradiation: Secondary | ICD-10-CM | POA: Diagnosis not present
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
## 2018-07-03 NOTE — Patient Instructions (Signed)
No Driving °

## 2018-07-05 ENCOUNTER — Ambulatory Visit: Payer: Medicare Other | Attending: Physical Medicine & Rehabilitation | Admitting: Physical Therapy

## 2018-07-05 ENCOUNTER — Encounter: Payer: Self-pay | Admitting: Physical Therapy

## 2018-07-05 DIAGNOSIS — R269 Unspecified abnormalities of gait and mobility: Secondary | ICD-10-CM | POA: Diagnosis present

## 2018-07-05 DIAGNOSIS — R293 Abnormal posture: Secondary | ICD-10-CM

## 2018-07-05 DIAGNOSIS — M6281 Muscle weakness (generalized): Secondary | ICD-10-CM | POA: Diagnosis not present

## 2018-07-05 DIAGNOSIS — R2681 Unsteadiness on feet: Secondary | ICD-10-CM

## 2018-07-05 NOTE — Therapy (Signed)
[facility] [address] [place], [place], [address] Phone: [phone]   Fax:  [phone]  Physical Therapy Treatment  Patient Details  Name: [name] MRN: [id number] Date of Birth: [birth year] Referring Provider (PT): Dr. Alysia Penna   Encounter Date: 07/05/2018  PT End of Session - 07/05/18 1324    Visit Number  4    Number of Visits  17    Date for PT Re-Evaluation  08/06/18    Authorization Type  [employer] Medicare-* requires 10th visit PN    PT Start Time  1102    PT Stop Time  1145    PT Time Calculation (min)  43 min    Activity Tolerance  Patient tolerated treatment well    Behavior During Therapy  [facility] for tasks assessed/performed       Past Medical History:  Diagnosis Date  . Anxiety   . Back ache   . Breast cancer ([place])    right  . Bronchitis   . Cancer of colon ([place])   . Depression   . DM (diabetes mellitus) ([place])   . Dyspnea on exertion   . Fall on steps    age 17 feel down iron stairs couldnot move leg for 6 mths  . Family history of primary TB   . Fracture of ankle, closed 2014   left  . History of chicken pox   . History of hysterectomy 04/1980  . History of primary TB   . Hyperlipidemia   . Hypertension   . Kidney disease, chronic, stage III (moderate, EGFR 30-59 ml/min) ([place]) 2017  . Kidney disorder    reduction function  . Kidney stones   . Knee pain, right   . Lung abnormality    age 77 patient had pna spot on lungs   . Microalbuminuria   . Muscle cramps    both legs  . Muscular degeneration    of right eye  . Nephrolithiasis   . Palpitations   . Personal history of radiation therapy   . Stroke [facility])     Past Surgical History:  Procedure Laterality Date  . ABLATION  2007   fast heart rate  . ANKLE FRACTURE SURGERY Left 2014  . BREAST LUMPECTOMY Right   . CATARACT EXTRACTION Right 2017  . colonscopy  2015  . HYSTEROTOMY     45 years ago  . TONSILLECTOMY AND  ADENOIDECTOMY     age 77    There were no vitals filed for this visit.  Subjective Assessment - 07/05/18 1107    Subjective  No pain today.  Did have a small fall where she was reaching down and her foot tripped over an item in the floor causing her to sit back on the floor.  No injury and was able to get back up without difficulty.      Pertinent History  CVA, Small vessel disease, DM type 2 in nonobese, Stage 70 CKD, HLD, hx of R Breast cancer (TAKE BP ON L SIDE), Colon cancer, TIA, Dyspnea on exertion, Essential HTN, Sinus Tachycardia, Anxiety and depression    Limitations  Lifting;Standing;Walking    How long can you walk comfortably?  Reports she stands to cook, perform laundry, take out garbage and is currently only ambulating around house. She performs walking around neighborhood with her son for 20 min durations with cane.     Patient Stated Goals  Reports goals of driving her car again, walking more with her cane,  and return to PLOF.     Currently in Pain?  No/denies         [facility] PT Assessment - 07/05/18 1113      Ambulation/Gait   Ambulation/Gait  Yes    Ambulation/Gait Assistance  5: Supervision    Ambulation Distance (Feet)  300 Feet    Assistive device  Straight cane    Ambulation Surface  Level;Indoor    Stairs  Yes    Stairs Assistance  5: Supervision    Stairs Assistance Details (indicate cue type and reason)  alternating to ascend, step to to descend    Stair Management Technique  One rail Left;Alternating pattern;Step to pattern;Forwards;With cane    Number of Stairs  4    Height of Stairs  6    Ramp  5: Supervision    Ramp Details (indicate cue type and reason)  with cane x 2 reps, verbal cues for foot clearance when ascending    Curb  5: Supervision    Curb Details (indicate cue type and reason)  with cane      Standardized Balance Assessment   Standardized Balance Assessment  Berg Balance Test;10 meter walk test;Five Times Sit to Stand    Five times sit to  stand comments   17 seconds from arm chair, no UE support    10 Meter Walk  13.81 sec with cane or 2.37 ft/sec; without cane 11.56 seconds or 2.83 ft/sec       Berg Balance Test   Sit to Stand  Able to stand without using hands and stabilize independently    Standing Unsupported  Able to stand safely 2 minutes    Sitting with Back Unsupported but Feet Supported on Floor or Stool  Able to sit safely and securely 2 minutes    Stand to Sit  Sits safely with minimal use of hands    Transfers  Able to transfer safely, minor use of hands    Standing Unsupported with Eyes Closed  Able to stand 10 seconds safely    Standing Ubsupported with Feet Together  Able to place feet together independently and stand 1 minute safely    From Standing, Reach Forward with Outstretched Arm  Can reach confidently >25 cm (10")    From Standing Position, Pick up Object from Floor  Able to pick up shoe safely and easily    From Standing Position, Turn to Look Behind Over each Shoulder  Looks behind one side only/other side shows less weight shift    Turn 360 Degrees  Able to turn 360 degrees safely but slowly    Standing Unsupported, Alternately Place Feet on Step/Stool  Able to complete 4 steps without aid or supervision    Standing Unsupported, One Foot in Front  Able to plae foot ahead of the other independently and hold 30 seconds    Standing on One Leg  Able to lift leg independently and hold equal to or more than 3 seconds    Total Score  48    Berg comment:  48/56                           PT Education - 07/05/18 1324    Education Details  progress towards goals and areas to continue to focus on over next few weeks    Person(s) Educated  Patient    Methods  Explanation    Comprehension  Verbalized understanding  PT Short Term Goals - 07/05/18 1111      PT SHORT TERM GOAL #1   Title  Patient will participate in the establishment of an initial HEP to improve strength, balance,  and functional mobility     Time  4    Period  Weeks    Status  Achieved      PT SHORT TERM GOAL #2   Title  Pt will improve 5xSTS to </=15 seconds from standard height chair with no UE assist to reduce fall risk potential and demonstrate improvement in LE strength     Baseline  17 seconds from arm chair without UE support    Time  4    Period  Weeks    Status  Partially Met      PT SHORT TERM GOAL #3   Title  Pt will improve gait speed with her SPC to >/= 2.17 ft/sec and with no AD to >/= 2.79 ft/sec to improve safety and independence with functional mobility     Baseline  2.3 ft/sec with cane; 2.83 without cane    Time  4    Period  Weeks    Status  Achieved      PT SHORT TERM GOAL #4   Title  Pt will improve her Berg score to >/= 46/56 indicating reduction in fall risk potential     Baseline  48/56    Time  4    Period  Weeks    Status  Achieved      PT SHORT TERM GOAL #5   Title  Pt will ambulate 300 feet in clinic using her [facility] navigating obstacles, curb, ramp and perform 4 steps utilizing step to stair pattern with cane and single HR with therapist providing CGA to improve independence with functional mobility    Baseline  300' indoors, curb and ramp x 2 with cane with supervision.  4 stairs with cane and rail alternating sequence to ascend, step to to descend    Time  4    Period  Weeks    Status  Achieved      PT SHORT TERM GOAL #6   Title  Pt will participate in FGA with no AD with establishment of appropriate LTG    Time  4    Period  Weeks    Status  Achieved        PT Long Term Goals - 07/05/18 1127      PT LONG TERM GOAL #1   Title  Patient will compliance and independence with HEP to improve strength, balance, and functional mobility     Time  8    Period  Weeks    Status  New      PT LONG TERM GOAL #2   Title  Pt will improve 5xSTS to </=15 seconds from standard height chair with no UE assist to reduce fall risk potential and demonstrate improvement  in LE strength     Time  8    Period  Weeks    Status  Revised      PT LONG TERM GOAL #3   Title  Pt will improve gait speed with her SPC to >/= 2.47 ft/sec and with no AD to >/= 3.09 ft/sec to improve safety and independence with functional mobility     Time  8    Period  Weeks    Status  New      PT LONG TERM GOAL #4   Title  Pt will improve her Berg score to >/= 50/56 indicating reduction in fall risk potential     Time  8    Period  Weeks    Status  New      PT LONG TERM GOAL #5   Title  Pt will ambulate 500 feet outdoors with her [place] navigating obstacles, uneven terrain, curb, ramp and perform 8 steps utilizing reciprocal stepping pattern with cane and single HR with therapist providing supervision to improve independence with community accessibility     Time  8    Period  Weeks    Status  New      PT LONG TERM GOAL #6   Title  Pt will improve FGA score to >19/30 indicating reduction in fall risk potential during dynamic gait    Time  8    Period  Weeks    Status  New            Plan - 07/05/18 1140    Clinical Impression Statement  Session focused on assessment of progress towards STG.  Pt is making good progress and has met 5/6 STG; pt partially met five time sit to stand goal.  Time has decreased but not to goal level.  Discussed ongoing areas of impairment and plan for next few visits.  Pt verbalized agreement.    Rehab Potential  Good    Clinical Impairments Affecting Rehab Potential  relies on family for transportation. Pt reports she has high anxiety when ambulating in clinic and is taking medication for this.     PT Frequency  2x / week    PT Duration  8 weeks    PT Treatment/Interventions  ADLs/Self Care Home Management;Balance training;Neuromuscular re-education;DME Instruction;Gait training;Patient/family education;Stair training;Electrical Stimulation;Functional mobility training;Therapeutic activities;Therapeutic exercise;Passive range of motion;Aquatic  Therapy    PT Next Visit Plan   TAKE BP ON L SIDE 2/2 TO HX OF R BREAST CANCER**   Revise HEP: continue to focus on SLS, tandem stand and gait, stepping over obstacles, gait with head turns, backwards gait, balance with eyes closed, stairs with alternating sequence.  Gait outside if weather is appropriate    PT Home Exercise Plan  [phone]     Consulted and Agree with Plan of Care  Patient       Patient will benefit from skilled therapeutic intervention in order to improve the following deficits and impairments:  Abnormal gait, Decreased strength, Decreased balance, Decreased mobility, Postural dysfunction, Decreased activity tolerance, Decreased endurance, Difficulty walking  Visit Diagnosis: Muscle weakness (generalized)  Unsteadiness on feet  Abnormality of gait and mobility  Abnormal posture     Problem List Patient Active Problem List   Diagnosis Date Noted  . 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 Labile blood pressure   . Elevated blood pressure reading   . CKD (chronic kidney disease), stage III ([place])   . Diabetes mellitus type 2 in obese ([place])   . Small vessel disease ([place]) 03/28/2018  . Diabetes mellitus type 2 in nonobese ([facility])   . Anxiety state   . Stage 3 chronic kidney disease ([place])   . Hyperlipidemia   . History of breast cancer   . Acute ischemic stroke ([place])   . TIA (transient ischemic attack) 03/24/2018  . Dyspnea on exertion 05/30/2017  . Sinus tachycardia 05/30/2017  . Mixed hyperlipidemia 05/30/2017  . Essential hypertension 05/30/2017    Rico Junker, PT, DPT 07/05/18    1:29 PM    [facility]  [facility] [address] [place], [place], [address] Phone: [phone]   Fax:  [phone]  Name: [name] MRN: [id number] Date of Birth: 11-06-1940

## 2018-07-07 ENCOUNTER — Ambulatory Visit: Payer: Medicare Other

## 2018-07-07 DIAGNOSIS — M6281 Muscle weakness (generalized): Secondary | ICD-10-CM

## 2018-07-07 DIAGNOSIS — R269 Unspecified abnormalities of gait and mobility: Secondary | ICD-10-CM

## 2018-07-07 DIAGNOSIS — R293 Abnormal posture: Secondary | ICD-10-CM

## 2018-07-07 DIAGNOSIS — R2681 Unsteadiness on feet: Secondary | ICD-10-CM

## 2018-07-07 NOTE — Therapy (Signed)
[facility] [address] [place], [place], [address] Phone: [phone]   Fax:  [phone]  Physical Therapy Treatment  Patient Details  Name: [name] MRN: [id number] Date of Birth: [birth year] Referring Provider (PT): Dr. Alysia Penna   Encounter Date: 07/07/2018  PT End of Session - 07/07/18 1020    Visit Number  5    Number of Visits  17    Date for PT Re-Evaluation  08/06/18    Authorization Type  [employer] Medicare-* requires 10th visit PN    PT Start Time  1016    PT Stop Time  1100    PT Time Calculation (min)  44 min    Equipment Utilized During Treatment  Gait belt    Activity Tolerance  Patient tolerated treatment well    Behavior During Therapy  WFL for tasks assessed/performed       Past Medical History:  Diagnosis Date  . Anxiety   . Back ache   . Breast cancer ([place])    right  . Bronchitis   . Cancer of colon ([place])   . Depression   . DM (diabetes mellitus) ([place])   . Dyspnea on exertion   . Fall on steps    age 77 feel down iron stairs couldnot move leg for 6 mths  . Family history of primary TB   . Fracture of ankle, closed 2014   left  . History of chicken pox   . History of hysterectomy 04/1980  . History of primary TB   . Hyperlipidemia   . Hypertension   . Kidney disease, chronic, stage III (moderate, EGFR 30-59 ml/min) ([place]) 2017  . Kidney disorder    reduction function  . Kidney stones   . Knee pain, right   . Lung abnormality    age 77 patient had pna spot on lungs   . Microalbuminuria   . Muscle cramps    both legs  . Muscular degeneration    of right eye  . Nephrolithiasis   . Palpitations   . Personal history of radiation therapy   . Stroke [facility])     Past Surgical History:  Procedure Laterality Date  . ABLATION  2007   fast heart rate  . ANKLE FRACTURE SURGERY Left 2014  . BREAST LUMPECTOMY Right   . CATARACT EXTRACTION Right 2017  . colonscopy  2015  .  HYSTEROTOMY     45 years ago  . TONSILLECTOMY AND ADENOIDECTOMY     age 18    There were no vitals filed for this visit.  Subjective Assessment - 07/07/18 1018    Subjective  Pt reports cramp in RLE that started last night, its still "a little sore". HEP is going well other than SLS, she is having trouble performing this exercise.     Patient is accompained by:  Family member    Pertinent History  CVA, Small vessel disease, DM type 2 in nonobese, Stage 18 CKD, HLD, hx of R Breast cancer (TAKE BP ON L SIDE), Colon cancer, TIA, Dyspnea on exertion, Essential HTN, Sinus Tachycardia, Anxiety and depression    Limitations  Lifting;Standing;Walking    How long can you walk comfortably?  Reports she stands to cook, perform laundry, take out garbage and is currently only ambulating around house. She performs walking around neighborhood with her son for 20 min durations with cane.     Patient Stated Goals  Reports goals of driving her car again, walking more  with her cane, and return to PLOF.     Currently in Pain?  No/denies       [facility] Adult PT Treatment/Exercise - 07/07/18 1022      Transfers   Transfers  Sit to Stand;Stand to Sit    Sit to Stand  5: Supervision    Sit to Stand Details (indicate cue type and reason)  Pt required no UE support during STS, increased time required and cues for forward gaze when standing    Stand to Sit  5: Supervision    Stand to Sit Details  VC's for controlled descent.       Ambulation/Gait   Ambulation/Gait  Yes    Ambulation/Gait Assistance  5: Supervision;4: Min guard    Ambulation/Gait Assistance Details  In hallway scanning performing head turns/nods/diagonals/stop to turn/change in pace with min/mod deviation from path and slow pace while looking up/diagonal.     Ambulation Distance (Feet)  750 Feet    Assistive device  Straight cane    Gait Pattern  Step-through pattern;Decreased arm swing - left;Decreased step length - left;Decreased stride  length;Decreased dorsiflexion - left;Decreased weight shift to left;Decreased trunk rotation;Trunk flexed    Ambulation Surface  Level;Indoor    Stairs  Yes    Stairs Assistance  5: Supervision    Stairs Assistance Details (indicate cue type and reason)  Alt pattern while ascending/descending, no LOB, minimal instability.     Stair Management Technique  One rail Left;Alternating pattern;Forwards;With cane    Number of Stairs  4    Height of Stairs  6    Ramp  5: Supervision    Ramp Details (indicate cue type and reason)  VC's for technique, cane placement.     Curb  5: Supervision    Curb Details (indicate cue type and reason)  VC's for posture and pacing.       High Level Balance   High Level Balance Activities  Side stepping;Negotitating around obstacles;Negotiating over obstacles    High Level Balance Comments  In parallel bars negotiating over foam beams forwards/side stepping with BUE support/step to pattern progressing to no UE support/alt pattern with one episode of LOB, pt able to stabilize using BUE on bars. pt required 2 seated rest breaks during activity.       Neuro Re-ed    Neuro Re-ed Details   Pt in parallel bars performing toe tap to cones with BUE support progressing to no UE support required/min guard for assistance, no LOB, some instability. One seated rest break required.          PT Short Term Goals - 07/05/18 1111      PT SHORT TERM GOAL #1   Title  Patient will participate in the establishment of an initial HEP to improve strength, balance, and functional mobility     Time  4    Period  Weeks    Status  Achieved      PT SHORT TERM GOAL #2   Title  Pt will improve 5xSTS to </=15 seconds from standard height chair with no UE assist to reduce fall risk potential and demonstrate improvement in LE strength     Baseline  17 seconds from arm chair without UE support    Time  4    Period  Weeks    Status  Partially Met      PT SHORT TERM GOAL #3   Title  Pt will  improve gait speed with her SPC to >/= 2.17  ft/sec and with no AD to >/= 2.79 ft/sec to improve safety and independence with functional mobility     Baseline  2.3 ft/sec with cane; 2.83 without cane    Time  4    Period  Weeks    Status  Achieved      PT SHORT TERM GOAL #4   Title  Pt will improve her Berg score to >/= 46/56 indicating reduction in fall risk potential     Baseline  48/56    Time  4    Period  Weeks    Status  Achieved      PT SHORT TERM GOAL #5   Title  Pt will ambulate 300 feet in clinic using her [facility] navigating obstacles, curb, ramp and perform 4 steps utilizing step to stair pattern with cane and single HR with therapist providing CGA to improve independence with functional mobility    Baseline  300' indoors, curb and ramp x 2 with cane with supervision.  4 stairs with cane and rail alternating sequence to ascend, step to to descend    Time  4    Period  Weeks    Status  Achieved      PT SHORT TERM GOAL #6   Title  Pt will participate in FGA with no AD with establishment of appropriate LTG    Time  4    Period  Weeks    Status  Achieved        PT Long Term Goals - 07/05/18 1127      PT LONG TERM GOAL #1   Title  Patient will compliance and independence with HEP to improve strength, balance, and functional mobility     Time  8    Period  Weeks    Status  New      PT LONG TERM GOAL #2   Title  Pt will improve 5xSTS to </=15 seconds from standard height chair with no UE assist to reduce fall risk potential and demonstrate improvement in LE strength     Time  8    Period  Weeks    Status  Revised      PT LONG TERM GOAL #3   Title  Pt will improve gait speed with her SPC to >/= 2.47 ft/sec and with no AD to >/= 3.09 ft/sec to improve safety and independence with functional mobility     Time  8    Period  Weeks    Status  New      PT LONG TERM GOAL #4   Title  Pt will improve her Berg score to >/= 50/56 indicating reduction in fall risk potential      Time  8    Period  Weeks    Status  New      PT LONG TERM GOAL #5   Title  Pt will ambulate 500 feet outdoors with her SPC navigating obstacles, uneven terrain, curb, ramp and perform 8 steps utilizing reciprocal stepping pattern with cane and single HR with therapist providing supervision to improve independence with community accessibility     Time  8    Period  Weeks    Status  New      PT LONG TERM GOAL #6   Title  Pt will improve FGA score to >19/30 indicating reduction in fall risk potential during dynamic gait    Time  8    Period  Weeks    Status  New  Plan - 07/07/18 1247    Clinical Impression Statement  Todays skilled session focused on gait training with SPC while performing head movements/sudden stops/turns/change in pace with min/mod deviation, curb/stair/ramp training and STS with supervision for safety, and high level balance progressing to no BUE support required. Pt should beneift from continued PT sessions to progress towards goals.     Rehab Potential  Good    Clinical Impairments Affecting Rehab Potential  relies on family for transportation. Pt reports she has high anxiety when ambulating in clinic and is taking medication for this.     PT Frequency  2x / week    PT Duration  8 weeks    PT Treatment/Interventions  ADLs/Self Care Home Management;Balance training;Neuromuscular re-education;DME Instruction;Gait training;Patient/family education;Stair training;Electrical Stimulation;Functional mobility training;Therapeutic activities;Therapeutic exercise;Passive range of motion;Aquatic Therapy    PT Next Visit Plan   TAKE BP ON L SIDE 2/2 TO HX OF R BREAST CANCER**   Revise HEP: continue to focus on SLS, tandem stand and gait, stepping over obstacles, gait with head turns, backwards gait, balance with eyes closed, stairs with alternating sequence.  Gait outside if weather is appropriate    PT Home Exercise Plan  [phone]     Consulted and Agree with Plan of Care   Patient       Patient will benefit from skilled therapeutic intervention in order to improve the following deficits and impairments:  Abnormal gait, Decreased strength, Decreased balance, Decreased mobility, Postural dysfunction, Decreased activity tolerance, Decreased endurance, Difficulty walking  Visit Diagnosis: Muscle weakness (generalized)  Unsteadiness on feet  Abnormality of gait and mobility  Abnormal posture     Problem List Patient Active Problem List   Diagnosis Date Noted  . 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 Labile blood pressure   . Elevated blood pressure reading   . CKD (chronic kidney disease), stage III ([place])   . Diabetes mellitus type 2 in obese ([place])   . Small vessel disease ([place]) 03/28/2018  . Diabetes mellitus type 2 in nonobese ([facility])   . Anxiety state   . Stage 3 chronic kidney disease ([place])   . Hyperlipidemia   . History of breast cancer   . Acute ischemic stroke ([place])   . TIA (transient ischemic attack) 03/24/2018  . Dyspnea on exertion 05/30/2017  . Sinus tachycardia 05/30/2017  . Mixed hyperlipidemia 05/30/2017  . Essential hypertension 05/30/2017    , PTA   A  07/07/2018, 12:53 PM  [facility] [address] [place], [place], [address] Phone: [phone]   Fax:  [phone]  Name: Rosary Filosa MRN: [id number] Date of Birth: 07/14/1941

## 2018-07-10 ENCOUNTER — Ambulatory Visit: Payer: Medicare Other | Admitting: Occupational Therapy

## 2018-07-10 ENCOUNTER — Ambulatory Visit: Payer: Medicare Other | Admitting: Physical Therapy

## 2018-07-10 ENCOUNTER — Encounter: Payer: Self-pay | Admitting: Occupational Therapy

## 2018-07-10 DIAGNOSIS — M6281 Muscle weakness (generalized): Secondary | ICD-10-CM

## 2018-07-10 DIAGNOSIS — R2681 Unsteadiness on feet: Secondary | ICD-10-CM

## 2018-07-10 DIAGNOSIS — R293 Abnormal posture: Secondary | ICD-10-CM

## 2018-07-10 DIAGNOSIS — R269 Unspecified abnormalities of gait and mobility: Secondary | ICD-10-CM

## 2018-07-10 NOTE — Therapy (Signed)
[facility] [address] [place], [place], [address] Phone: [phone]   Fax:  [phone]  Occupational Therapy Treatment  Patient Details  Name: [name] MRN: [id number] Date of Birth: April 30, 1941 Referring Provider (OT): Dr. Jacobo Forest   Encounter Date: 07/10/2018  OT End of Session - 07/10/18 1139    Visit Number  2    Number of Visits  3    Date for OT Re-Evaluation  07/10/18   date adjusted as this is pt's first visit since eval and she is certified through 02/27/1659   Authorization Type  medicare - will need PN every 10th visit if POC changes    Authorization - Visit Number  2    Authorization - Number of Visits  10    OT Start Time  1102    OT Stop Time  1138    OT Time Calculation (min)  36 min    Activity Tolerance  Patient tolerated treatment well       Past Medical History:  Diagnosis Date  . Anxiety   . Back ache   . Breast cancer ([place])    right  . Bronchitis   . Cancer of colon ([place])   . Depression   . DM (diabetes mellitus) ([place])   . Dyspnea on exertion   . Fall on steps    age 59 feel down iron stairs couldnot move leg for 6 mths  . Family history of primary TB   . Fracture of ankle, closed 2014   left  . History of chicken pox   . History of hysterectomy 04/1980  . History of primary TB   . Hyperlipidemia   . Hypertension   . Kidney disease, chronic, stage III (moderate, EGFR 30-59 ml/min) ([place]) 2017  . Kidney disorder    reduction function  . Kidney stones   . Knee pain, right   . Lung abnormality    age 29 patient had pna spot on lungs   . Microalbuminuria   . Muscle cramps    both legs  . Muscular degeneration    of right eye  . Nephrolithiasis   . Palpitations   . Personal history of radiation therapy   . Stroke [facility])     Past Surgical History:  Procedure Laterality Date  . ABLATION  2007   fast heart rate  . ANKLE FRACTURE SURGERY Left 2014  . BREAST  LUMPECTOMY Right   . CATARACT EXTRACTION Right 2017  . colonscopy  2015  . HYSTEROTOMY     45 years ago  . TONSILLECTOMY AND ADENOIDECTOMY     age 65    There were no vitals filed for this visit.  Subjective Assessment - 07/10/18 1106    Subjective   I feel like I can use the left hand better now.     Pertinent History  R [place] CVA 03/24/2018 .  Pt d/c home on 04/11/2018 after inpt rehab stay.  HHPT and OT completed approximately 2-3 weeks.  PMH:      Patient Stated Goals  I feel like my balance is my biggest problem.  I want to do all the things I used to.      Currently in Pain?  No/denies                   OT Treatments/Exercises (OP) - 07/10/18 0001      ADLs   Leisure  Educated pt on driving eval as MD sent  request for info for pt.  Reviewed process and pt reports (and MD note supports) that MD will revisit with pt at next visit. MD has information and pt able to verbalize understanding of all information. Pt knows she will need MD order for driving eval.       Exercises   Exercises  Hand      Hand Exercises   Other Hand Exercises  Pt issued red putty and HEP for grip and pinch strength.  Pt able to return demonstrate all activities after instruction and practice.  See pt instruction section for details.              OT Education - 07/10/18 1136    Education Details  HEP for grip and pinch strength, driving evaluation    Person(s) Educated  Patient    Methods  Explanation;Demonstration;Handout    Comprehension  Verbalized understanding;Returned demonstration          OT Long Term Goals - 07/10/18 1137      OT LONG TERM GOAL #1   Title  Pt and family will be mod I with HEP for grip and UE strengthening - 07/10/2018 (date adjusted as this is pts first visit back and pt is certified until 01/31/9380)    Status  Achieved            Plan - 07/10/18 1138    Clinical Impression Statement  Pt has met her OT goal and is ready for discharge     Occupational Profile and client history currently impacting functional performance  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  PMH: small vessel disease, DM, peripheral neuropathy, h/o tachycardia with ablation in 2007, chronic kidney disease Stage III, h/o breast cancer, HTN, HLD, dyspnea with exertion.     Occupational performance deficits (Please refer to evaluation for details):  IADL's;Leisure;Social Participation    Rehab Potential  Good    OT Frequency  1x / week    OT Duration  2 weeks    OT Treatment/Interventions  Self-care/ADL training;Therapeutic exercise;Therapeutic activities;Patient/family education    Plan  d/c from OT    Consulted and Agree with Plan of Care  Patient       Patient will benefit from skilled therapeutic intervention in order to improve the following deficits and impairments:  Decreased strength  Visit Diagnosis: Muscle weakness (generalized)    Problem List Patient Active Problem List   Diagnosis Date Noted  . 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 Labile blood pressure   . Elevated blood pressure reading   . CKD (chronic kidney disease), stage III ([place])   . Diabetes mellitus type 2 in obese ([place])   . Small vessel disease ([place]) 03/28/2018  . Diabetes mellitus type 2 in nonobese ([facility])   . Anxiety state   . Stage 3 chronic kidney disease ([place])   . Hyperlipidemia   . History of breast cancer   . Acute ischemic stroke ([facility] [place])   . TIA (transient ischemic attack) 03/24/2018  . Dyspnea on exertion 05/30/2017  . Sinus tachycardia 05/30/2017  . Mixed hyperlipidemia 05/30/2017  . Essential hypertension 05/30/2017   OCCUPATIONAL THERAPY DISCHARGE SUMMARY  Visits from Start of Care: 2  Current functional level related to goals / functional outcomes: See above   Remaining deficits: Mild weakness L hand; balance (PT addressing)   Education / Equipment: HEP Plan: Patient agrees to discharge.  Patient goals were met. Patient is being discharged due  to meeting the stated rehab goals.  ?????  Quay Burow, OTR/L 07/10/2018, 11:40 AM  [facility] [address] [place], [place], [address] Phone: [phone]   Fax:  [phone]  Name: [name] MRN: [id number] Date of Birth: Oct 19, 1940

## 2018-07-10 NOTE — Therapy (Signed)
[facility] [address] [place], [place], [address] Phone: [phone]   Fax:  [phone]  Physical Therapy Treatment  Patient Details  Name: [name] MRN: [id number] Date of Birth: 08-26-1940 Referring Provider (PT): Dr. Alysia Penna   Encounter Date: 07/10/2018  PT End of Session - 07/10/18 1102    Visit Number  6    Number of Visits  17    Date for PT Re-Evaluation  08/06/18    Authorization Type  [employer] Medicare-* requires 10th visit PN    PT Start Time  1014    PT Stop Time  1055    PT Time Calculation (min)  41 min    Activity Tolerance  Patient tolerated treatment well    Behavior During Therapy  [facility] for tasks assessed/performed       Past Medical History:  Diagnosis Date  . Anxiety   . Back ache   . Breast cancer ([place])    right  . Bronchitis   . Cancer of colon ([place])   . Depression   . DM (diabetes mellitus) ([place])   . Dyspnea on exertion   . Fall on steps    age 77 feel down iron stairs couldnot move leg for 6 mths  . Family history of primary TB   . Fracture of ankle, closed 2014   left  . History of chicken pox   . History of hysterectomy 04/1980  . History of primary TB   . Hyperlipidemia   . Hypertension   . Kidney disease, chronic, stage III (moderate, EGFR 30-59 ml/min) ([place]) 2017  . Kidney disorder    reduction function  . Kidney stones   . Knee pain, right   . Lung abnormality    age 77 patient had pna spot on lungs   . Microalbuminuria   . Muscle cramps    both legs  . Muscular degeneration    of right eye  . Nephrolithiasis   . Palpitations   . Personal history of radiation therapy   . Stroke  [facility])     Past Surgical History:  Procedure Laterality Date  . ABLATION  2007   fast heart rate  . ANKLE FRACTURE SURGERY Left 2014  . BREAST LUMPECTOMY Right   . CATARACT EXTRACTION Right 2017  . colonscopy  2015  . HYSTEROTOMY     45 years ago  . TONSILLECTOMY AND  ADENOIDECTOMY     age 77    There were no vitals filed for this visit.  Subjective Assessment - 07/10/18 1034    Subjective  Pt relays she has some soreness the next day after PT sessions but then it goes away. She relays much improvement since starting PT.    Currently in Pain?  No/denies                       [facility] Adult PT Treatment/Exercise - 07/10/18 0001      Ambulation/Gait   Ambulation/Gait  Yes    Ambulation/Gait Assistance  5: Supervision    Ambulation Distance (Feet)  800 Feet    Assistive device  --   no AD   Ambulation Surface  Level;Indoor    Stairs  Yes    Stairs Assistance  5: Supervision    Stairs Assistance Details (indicate cue type and reason)  alt pattern up and down, one handrail up/down X 5 reps    Number of Stairs  4    Height  of Stairs  6      High Level Balance   High Level Balance Comments  SLS 3-5 sec avg X 5 on Lt, 10 sec avg on Rt. standing on Ax 30 sec X 3 Feet together eyes open progressed to feet apart eyes closed then mod tandem stance. Then at counter sidestepping and retrowalk X 5 ea, then tandem walk in parallel bars up/down X 5. Standing Heel toe raises 2X10               PT Short Term Goals - 07/05/18 1111      PT SHORT TERM GOAL #1   Title  Patient will participate in the establishment of an initial HEP to improve strength, balance, and functional mobility     Time  4    Period  Weeks    Status  Achieved      PT SHORT TERM GOAL #2   Title  Pt will improve 5xSTS to </=15 seconds from standard height chair with no UE assist to reduce fall risk potential and demonstrate improvement in LE strength     Baseline  17 seconds from arm chair without UE support    Time  4    Period  Weeks    Status  Partially Met      PT SHORT TERM GOAL #3   Title  Pt will improve gait speed with her SPC to >/= 2.17 ft/sec and with no AD to >/= 2.79 ft/sec to improve safety and independence with functional mobility     Baseline  2.3  ft/sec with cane; 2.83 without cane    Time  4    Period  Weeks    Status  Achieved      PT SHORT TERM GOAL #4   Title  Pt will improve her Berg score to >/= 46/56 indicating reduction in fall risk potential     Baseline  48/56    Time  4    Period  Weeks    Status  Achieved      PT SHORT TERM GOAL #5   Title  Pt will ambulate 300 feet in clinic using her [facility] navigating obstacles, curb, ramp and perform 4 steps utilizing step to stair pattern with cane and single HR with therapist providing CGA to improve independence with functional mobility    Baseline  300' indoors, curb and ramp x 2 with cane with supervision.  4 stairs with cane and rail alternating sequence to ascend, step to to descend    Time  4    Period  Weeks    Status  Achieved      PT SHORT TERM GOAL #6   Title  Pt will participate in FGA with no AD with establishment of appropriate LTG    Time  4    Period  Weeks    Status  Achieved        PT Long Term Goals - 07/05/18 1127      PT LONG TERM GOAL #1   Title  Patient will compliance and independence with HEP to improve strength, balance, and functional mobility     Time  8    Period  Weeks    Status  New      PT LONG TERM GOAL #2   Title  Pt will improve 5xSTS to </=15 seconds from standard height chair with no UE assist to reduce fall risk potential and demonstrate improvement in LE strength     Time  8    Period  Weeks    Status  Revised      PT LONG TERM GOAL #3   Title  Pt will improve gait speed with her SPC to >/= 2.47 ft/sec and with no AD to >/= 3.09 ft/sec to improve safety and independence with functional mobility     Time  8    Period  Weeks    Status  New      PT LONG TERM GOAL #4   Title  Pt will improve her Berg score to >/= 50/56 indicating reduction in fall risk potential     Time  8    Period  Weeks    Status  New      PT LONG TERM GOAL #5   Title  Pt will ambulate 500 feet outdoors with her [place] navigating obstacles, uneven terrain,  curb, ramp and perform 8 steps utilizing reciprocal stepping pattern with cane and single HR with therapist providing supervision to improve independence with community accessibility     Time  8    Period  Weeks    Status  New      PT LONG TERM GOAL #6   Title  Pt will improve FGA score to >19/30 indicating reduction in fall risk potential during dynamic gait    Time  8    Period  Weeks    Status  New            Plan - 07/10/18 1103    Clinical Impression Statement  Pt able to progress balance activities today and general graded exercise with good tolerance, see execise flow sheet for details. She appears to be improving in all areas and remains motivated with PT. PT will continue to progress as able    Rehab Potential  Good    Clinical Impairments Affecting Rehab Potential  relies on family for transportation. Pt reports she has high anxiety when ambulating in clinic and is taking medication for this.     PT Frequency  2x / week    PT Duration  8 weeks    PT Treatment/Interventions  ADLs/Self Care Home Management;Balance training;Neuromuscular re-education;DME Instruction;Gait training;Patient/family education;Stair training;Electrical Stimulation;Functional mobility training;Therapeutic activities;Therapeutic exercise;Passive range of motion;Aquatic Therapy    PT Next Visit Plan   TAKE BP ON L SIDE 2/2 TO HX OF R BREAST CANCER**   Revise HEP: continue to focus on SLS, tandem stand and gait, stepping over obstacles, gait with head turns, backwards gait, balance with eyes closed, stairs with alternating sequence.  Gait outside if weather is appropriate    PT Home Exercise Plan  [phone]     Consulted and Agree with Plan of Care  Patient       Patient will benefit from skilled therapeutic intervention in order to improve the following deficits and impairments:  Abnormal gait, Decreased strength, Decreased balance, Decreased mobility, Postural dysfunction, Decreased activity tolerance,  Decreased endurance, Difficulty walking  Visit Diagnosis: Muscle weakness (generalized)  Unsteadiness on feet  Abnormality of gait and mobility  Abnormal posture     Problem List Patient Active Problem List   Diagnosis Date Noted  . 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 Labile blood pressure   . Elevated blood pressure reading   . CKD (chronic kidney disease), stage III ([place])   . Diabetes mellitus type 2 in obese ([place])   . Small vessel disease ([place]) 03/28/2018  . Diabetes mellitus type 2 in nonobese ([facility])   . Anxiety state   .  Stage 3 chronic kidney disease ([place])   . Hyperlipidemia   . History of breast cancer   . Acute ischemic stroke ([place])   . TIA (transient ischemic attack) 03/24/2018  . Dyspnea on exertion 05/30/2017  . Sinus tachycardia 05/30/2017  . Mixed hyperlipidemia 05/30/2017  . Essential hypertension 05/30/2017    Debbe Odea, PT,DPT 07/10/2018, 11:05 AM  [facility] [address] [place], [place], [address] Phone: [phone]   Fax:  [phone]  Name: [name] MRN: [id number] Date of Birth: [birth year]

## 2018-07-10 NOTE — Patient Instructions (Signed)
1. Grip Strengthening (Resistive Putty)   Squeeze putty using thumb and all fingers. Repeat 15 times___ times. Do __2__ sessions per day. WHEN YOUR HAND GETS STRONGER, do 20 times.  Do 2 sessions per day.    2. Roll putty into a long tube on table and pinch along the tube.  Do 5 times.  Do 2 sessions per day.    Copyright  VHI. All rights reserved.   Local Driver Evaluation Programs:  Comprehensive Evaluation: includes clinical and in vehicle behind the wheel testing by OCCUPATIONAL THERAPIST. Programs have varying levels of adaptive controls available for trial.   [employer], [place] [address] [place], [place]  [address] [phone] or [phone] http://www.driver-rehab.com Evaluator:  Richelle Ito, OT/CDRS/CDI/SCDCM/[facility] [address] [place], [place] [address] [phone] [email or web address] Evaluators:  Bertram Savin, OT and Mertie Clause, OT  [facility] [facility] [place]) [place] ([place]!!) Physical [place] [address] [place], [place]  [address] [phone] [email or web address] Evaluators:  Bernadene Bell, KT; Heron Sabins, KT;  Shirlee Latch, KT (KT=kiniesotherapist)   Clinical evaluations only:  Includes clinical testing, refers to other programs or local certified driving instructor for behind the wheel testing.  [facility] (outpatient Rehab) [place] [address] [place], [place] [address] [phone] for scheduling [email or web address] Evaluators:  Valentino Hue, OT; Haynes Hoehn, OT  Other area clinical evaluators available upon request including [facility], [place] and [facility].       Resource List What is a Warden/ranger: Your Road Ahead - A Guide to [employer] Evaluations http://www.thehartford.com/resources/mature-market-excellence/publications-on-aging  Association for Musician - Disability and Driving Fact Sheets http://www.aded.net/?page=510  Driving after a Brain Injury: Brain Injury Association of [employer] Laud[place]aleEstates.be?A=SearchResult&SearchID=9495675&ObjectID=2758842&ObjectType=35  Driving with Adaptive Equipment: Chiropractor Association [email or web address]

## 2018-07-12 ENCOUNTER — Encounter: Payer: Self-pay | Admitting: Physical Therapy

## 2018-07-12 ENCOUNTER — Ambulatory Visit: Payer: Medicare Other | Admitting: Physical Therapy

## 2018-07-12 DIAGNOSIS — M6281 Muscle weakness (generalized): Secondary | ICD-10-CM | POA: Diagnosis not present

## 2018-07-12 DIAGNOSIS — R269 Unspecified abnormalities of gait and mobility: Secondary | ICD-10-CM

## 2018-07-12 DIAGNOSIS — R293 Abnormal posture: Secondary | ICD-10-CM

## 2018-07-12 DIAGNOSIS — R2681 Unsteadiness on feet: Secondary | ICD-10-CM

## 2018-07-12 NOTE — Patient Instructions (Addendum)
Access Code: [email or web address]  URL: [email or web address]  Date: 07/12/2018  Prepared by: Misty Stanley   Exercises  Tandem Walking with Counter Support - 4 reps - 1x daily - 7x weekly  Heel Toe Raises with Counter Support - 10 reps - 2 sets - 1x daily - 7x weekly  Backward Walking with Counter Support - 4 reps - 2x daily - 7x weekly  Walking March - 4 sets - 2x daily - 7x weekly  Standing Single Leg Stance with Counter Support - 3 sets - 10 seconds hold - 1x daily - 7x weekly  Tandem Stance with Eyes Closed - 2 sets - 10 SECONDS hold - 1x daily - 7x weekly  Tandem Stance with Head Rotation - 10 reps - 2 sets - 1x daily - 7x weekly  Tandem Stance with Head Nods - 10 reps - 2 sets - 1x daily - 7x weekly  Sit to Stand on Foam Pad - 10 reps - 2x daily - 7x weekly

## 2018-07-12 NOTE — Therapy (Signed)
[facility] [address] [place], [place], [address] Phone: [phone]   Fax:  [phone]  Physical Therapy Treatment  Patient Details  Name: [name] MRN: [id number] Date of Birth: 10/14/1940 Referring Provider (PT): Dr. Alysia Penna   Encounter Date: 07/12/2018  PT End of Session - 07/12/18 1321    Visit Number  7    Number of Visits  17    Date for PT Re-Evaluation  08/06/18    Authorization Type  [employer] Medicare-* requires 10th visit PN    PT Start Time  1103    PT Stop Time  1150    PT Time Calculation (min)  47 min    Activity Tolerance  Patient tolerated treatment well    Behavior During Therapy  [name] for tasks assessed/performed       Past Medical History:  Diagnosis Date  . Anxiety   . Back ache   . Breast cancer ([place])    right  . Bronchitis   . Cancer of colon ([facility])   . Depression   . DM (diabetes mellitus) ([place])   . Dyspnea on exertion   . Fall on steps    age 51 feel down iron stairs couldnot move leg for 6 mths  . Family history of primary TB   . Fracture of ankle, closed 2014   left  . History of chicken pox   . History of hysterectomy 04/1980  . History of primary TB   . Hyperlipidemia   . Hypertension   . Kidney disease, chronic, stage III (moderate, EGFR 30-59 ml/min) ([facility]) 2017  . Kidney disorder    reduction function  . Kidney stones   . Knee pain, right   . Lung abnormality    age 7 patient had pna spot on lungs   . Microalbuminuria   . Muscle cramps    both legs  . Muscular degeneration    of right eye  . Nephrolithiasis   . Palpitations   . Personal history of radiation therapy   . Stroke [facility])     Past Surgical History:  Procedure Laterality Date  . ABLATION  2007   fast heart rate  . ANKLE FRACTURE SURGERY Left 2014  . BREAST LUMPECTOMY Right   . CATARACT EXTRACTION Right 2017  . colonscopy  2015  . HYSTEROTOMY     45 years ago  . TONSILLECTOMY AND  ADENOIDECTOMY     age 77    There were no vitals filed for this visit.  Subjective Assessment - 07/12/18 1109    Subjective  Went shopping with a friend.  Used a buggy but still fatigued.      Pertinent History  CVA, Small vessel disease, DM type 2 in nonobese, Stage 77 CKD, HLD, hx of R Breast cancer (TAKE BP ON L SIDE), Colon cancer, TIA, Dyspnea on exertion, Essential HTN, Sinus Tachycardia, Anxiety and depression    Limitations  Lifting;Standing;Walking    How long can you walk comfortably?  Reports she stands to cook, perform laundry, take out garbage and is currently only ambulating around house. She performs walking around neighborhood with her son for 20 min durations with cane.     Patient Stated Goals  Reports goals of driving her car again, walking more with her cane, and return to PLOF.     Currently in Pain?  No/denies       Access Code: [email or web address]  URL: [email or web address]  Date: 07/12/2018  Prepared by: Letta Moynahan  [name]   Exercises  Tandem Walking with [employer] - 4 reps - 1x daily - 7x weekly  Heel Toe Raises with Counter Support - 10 reps - 2 sets - 1x daily - 7x weekly  Backward Walking with Counter Support - 4 reps - 2x daily - 7x weekly  Walking March - 4 sets - 2x daily - 7x weekly  Standing Single Leg Stance with Counter Support - 3 sets - 10 seconds hold - 1x daily - 7x weekly  Tandem Stance with Eyes Closed - 2 sets - 10 SECONDS hold - 1x daily - 7x weekly  Tandem Stance with Head Rotation - 10 reps - 2 sets - 1x daily - 7x weekly  Tandem Stance with Head Nods - 10 reps - 2 sets - 1x daily - 7x weekly  Sit to Stand on Foam Pad - 10 reps - 2x daily - 7x weekly     PT Education - 07/12/18 1321    Education Details  Update of HEP, safety with HEP    Person(s) Educated  Patient    Methods  Explanation;Demonstration    Comprehension  Verbalized understanding;Returned demonstration       PT Short Term Goals - 07/05/18 1111      PT SHORT TERM  GOAL #1   Title  Patient will participate in the establishment of an initial HEP to improve strength, balance, and functional mobility     Time  4    Period  Weeks    Status  Achieved      PT SHORT TERM GOAL #2   Title  Pt will improve 5xSTS to </=15 seconds from standard height chair with no UE assist to reduce fall risk potential and demonstrate improvement in LE strength     Baseline  17 seconds from arm chair without UE support    Time  4    Period  Weeks    Status  Partially Met      PT SHORT TERM GOAL #3   Title  Pt will improve gait speed with her SPC to >/= 2.17 ft/sec and with no AD to >/= 2.79 ft/sec to improve safety and independence with functional mobility     Baseline  2.3 ft/sec with cane; 2.83 without cane    Time  4    Period  Weeks    Status  Achieved      PT SHORT TERM GOAL #4   Title  Pt will improve her Berg score to >/= 46/56 indicating reduction in fall risk potential     Baseline  48/56    Time  4    Period  Weeks    Status  Achieved      PT SHORT TERM GOAL #5   Title  Pt will ambulate 300 feet in clinic using her [facility] navigating obstacles, curb, ramp and perform 4 steps utilizing step to stair pattern with cane and single HR with therapist providing CGA to improve independence with functional mobility    Baseline  300' indoors, curb and ramp x 2 with cane with supervision.  4 stairs with cane and rail alternating sequence to ascend, step to to descend    Time  4    Period  Weeks    Status  Achieved      PT SHORT TERM GOAL #6   Title  Pt will participate in FGA with no AD with establishment of appropriate LTG    Time  4  Period  Weeks    Status  Achieved        PT Long Term Goals - 07/12/18 1324      PT LONG TERM GOAL #1   Title  (ALL LTG DUE ON 08/06/18)Patient will compliance and independence with HEP to improve strength, balance, and functional mobility     Time  8    Period  Weeks    Status  New      PT LONG TERM GOAL #2   Title  Pt will  improve 5xSTS to </=15 seconds from standard height chair with no UE assist to reduce fall risk potential and demonstrate improvement in LE strength     Time  8    Period  Weeks    Status  Revised      PT LONG TERM GOAL #3   Title  Pt will improve gait speed with her SPC to >/= 2.47 ft/sec and with no AD to >/= 3.09 ft/sec to improve safety and independence with functional mobility     Time  8    Period  Weeks    Status  New      PT LONG TERM GOAL #4   Title  Pt will improve her Berg score to >/= 50/56 indicating reduction in fall risk potential     Time  8    Period  Weeks    Status  New      PT LONG TERM GOAL #5   Title  Pt will ambulate 500 feet outdoors with her SPC navigating obstacles, uneven terrain, curb, ramp and perform 8 steps utilizing reciprocal stepping pattern with cane and single HR with therapist providing supervision to improve independence with community accessibility     Time  8    Period  Weeks    Status  New      PT LONG TERM GOAL #6   Title  Pt will improve FGA score to >19/30 indicating reduction in fall risk potential during dynamic gait    Time  8    Period  Weeks    Status  New            Plan - 07/12/18 1321    Clinical Impression Statement  Due to patient progress, focused session on review of HEP and updating strengthening and balance exercises.  Pt noted to have slightly increased difficulty following cues today and sequencing exercises - pt reports not feeling well today.  Will continue to monitor and will continue to progress towards LTG as pt is able to tolerate.    Rehab Potential  Good    Clinical Impairments Affecting Rehab Potential  relies on family for transportation. Pt reports she has high anxiety when ambulating in clinic and is taking medication for this.     PT Frequency  2x / week    PT Duration  8 weeks    PT Treatment/Interventions  ADLs/Self Care Home Management;Balance training;Neuromuscular re-education;DME Instruction;Gait  training;Patient/family education;Stair training;Electrical Stimulation;Functional mobility training;Therapeutic activities;Therapeutic exercise;Passive range of motion;Aquatic Therapy    PT Next Visit Plan   TAKE BP ON L SIDE 2/2 TO HX OF R BREAST CANCER**   Continue to work on SLS: cone taps, stepping over obstacles, curb, stairs with alternating sequence.  Progress corner balance with partial tandem stance    PT Home Exercise Plan  [phone]     Consulted and Agree with Plan of Care  Patient       Patient will benefit from skilled therapeutic  intervention in order to improve the following deficits and impairments:  Abnormal gait, Decreased strength, Decreased balance, Decreased mobility, Postural dysfunction, Decreased activity tolerance, Decreased endurance, Difficulty walking  Visit Diagnosis: Muscle weakness (generalized)  Unsteadiness on feet  Abnormality of gait and mobility  Abnormal posture     Problem List Patient Active Problem List   Diagnosis Date Noted  . 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 Labile blood pressure   . Elevated blood pressure reading   . CKD (chronic kidney disease), stage III ([facility])   . Diabetes mellitus type 2 in obese ([place])   . Small vessel disease ([place]) 03/28/2018  . Diabetes mellitus type 2 in nonobese ([facility])   . Anxiety state   . Stage 3 chronic kidney disease ([place])   . Hyperlipidemia   . History of breast cancer   . Acute ischemic stroke ([place])   . TIA (transient ischemic attack) 03/24/2018  . Dyspnea on exertion 05/30/2017  . Sinus tachycardia 05/30/2017  . Mixed hyperlipidemia 05/30/2017  . Essential hypertension 05/30/2017    Rico Junker, PT, DPT 07/12/18    1:28 PM    [facility] [address] [place], [place], [address] Phone: [phone]   Fax:  [phone]  Name: [name] MRN: [id number] Date of Birth: Feb 12, 1941

## 2018-07-17 ENCOUNTER — Ambulatory Visit: Payer: Medicare Other | Admitting: Physical Therapy

## 2018-07-17 ENCOUNTER — Encounter: Payer: Self-pay | Admitting: Physical Therapy

## 2018-07-17 VITALS — BP 120/78

## 2018-07-17 DIAGNOSIS — R293 Abnormal posture: Secondary | ICD-10-CM

## 2018-07-17 DIAGNOSIS — M6281 Muscle weakness (generalized): Secondary | ICD-10-CM

## 2018-07-17 DIAGNOSIS — R2681 Unsteadiness on feet: Secondary | ICD-10-CM

## 2018-07-17 DIAGNOSIS — R269 Unspecified abnormalities of gait and mobility: Secondary | ICD-10-CM

## 2018-07-17 NOTE — Therapy (Signed)
[facility] [address] [place], [place], [address] Phone: [phone]   Fax:  [phone]  Physical Therapy Treatment  Patient Details  Name: [name] MRN: [id number] Date of Birth: 1941-07-07 Referring Provider (PT): Dr. Alysia Penna   Encounter Date: 07/17/2018  PT End of Session - 07/17/18 1257    Visit Number  8    Number of Visits  17    Date for PT Re-Evaluation  08/06/18    Authorization Type  [employer] Medicare-* requires 10th visit PN    PT Start Time  1017    PT Stop Time  1101    PT Time Calculation (min)  44 min    Activity Tolerance  Patient tolerated treatment well    Behavior During Therapy  [facility] for tasks assessed/performed       Past Medical History:  Diagnosis Date  . Anxiety   . Back ache   . Breast cancer ([place])    right  . Bronchitis   . Cancer of colon ([facility] [place])   . Depression   . DM (diabetes mellitus) ([place])   . Dyspnea on exertion   . Fall on steps    age 89 feel down iron stairs couldnot move leg for 6 mths  . Family history of primary TB   . Fracture of ankle, closed 2014   left  . History of chicken pox   . History of hysterectomy 04/1980  . History of primary TB   . Hyperlipidemia   . Hypertension   . Kidney disease, chronic, stage III (moderate, EGFR 30-59 ml/min) ([place]) 2017  . Kidney disorder    reduction function  . Kidney stones   . Knee pain, right   . Lung abnormality    age 35 patient had pna spot on lungs   . Microalbuminuria   . Muscle cramps    both legs  . Muscular degeneration    of right eye  . Nephrolithiasis   . Palpitations   . Personal history of radiation therapy   . Stroke [facility])     Past Surgical History:  Procedure Laterality Date  . ABLATION  2007   fast heart rate  . ANKLE FRACTURE SURGERY Left 2014  . BREAST LUMPECTOMY Right   . CATARACT EXTRACTION Right 2017  . colonscopy  2015  . HYSTEROTOMY     45 years ago  . TONSILLECTOMY AND  ADENOIDECTOMY     age 78    Vitals:   07/17/18 1023  BP: 120/78    Subjective Assessment - 07/17/18 1021    Subjective  Feeling better today; but still had mm cramps last night.  Had Christmas with the grandkids last night.      Pertinent History  CVA, Small vessel disease, DM type 2 in nonobese, Stage 3 CKD, HLD, hx of R Breast cancer (TAKE BP ON L SIDE), Colon cancer, TIA, Dyspnea on exertion, Essential HTN, Sinus Tachycardia, Anxiety and depression    Limitations  Lifting;Standing;Walking    How long can you walk comfortably?  Reports she stands to cook, perform laundry, take out garbage and is currently only ambulating around house. She performs walking around neighborhood with her son for 20 min durations with cane.     Patient Stated Goals  Reports goals of driving her car again, walking more with her cane, and return to PLOF.     Currently in Pain?  No/denies  [facility] Adult PT Treatment/Exercise - 07/17/18 1056      Ambulation/Gait   Stairs  Yes    Stairs Assistance  5: Supervision;4: Min guard    Stairs Assistance Details (indicate cue type and reason)  first two sets with one rail in RUE only; second two sets with cane only, no rail.  Able to perform alternating sequence but when using cane only pt required use of rail as well when descending    Stair Management Technique  No rails;One rail Right;Alternating pattern;Forwards;With cane    Number of Stairs  16    Height of Stairs  6          Balance Exercises - 07/17/18 1038      Balance Exercises: Standing   Step Over Hurdles / Cones  Forwards and retro over 4 low and 4 higher hurdles without UE support.  Required supervision-min A for balance stepping over forwards.  Required mod A for balance when stepping backwards; demonstrated shuffling backwards.  Also performed laterally L and R over 8 hurdles x 2 sets with min A for weight shifting.  Focused solely on stepping over obstacles  backwards with use of colored dots for visual feedback for foot placement and increased step length          PT Short Term Goals - 07/05/18 1111      PT SHORT TERM GOAL #1   Title  Patient will participate in the establishment of an initial HEP to improve strength, balance, and functional mobility     Time  4    Period  Weeks    Status  Achieved      PT SHORT TERM GOAL #2   Title  Pt will improve 5xSTS to </=15 seconds from standard height chair with no UE assist to reduce fall risk potential and demonstrate improvement in LE strength     Baseline  17 seconds from arm chair without UE support    Time  4    Period  Weeks    Status  Partially Met      PT SHORT TERM GOAL #3   Title  Pt will improve gait speed with her SPC to >/= 2.17 ft/sec and with no AD to >/= 2.79 ft/sec to improve safety and independence with functional mobility     Baseline  2.3 ft/sec with cane; 2.83 without cane    Time  4    Period  Weeks    Status  Achieved      PT SHORT TERM GOAL #4   Title  Pt will improve her Berg score to >/= 46/56 indicating reduction in fall risk potential     Baseline  48/56    Time  4    Period  Weeks    Status  Achieved      PT SHORT TERM GOAL #5   Title  Pt will ambulate 300 feet in clinic using her [facility] navigating obstacles, curb, ramp and perform 4 steps utilizing step to stair pattern with cane and single HR with therapist providing CGA to improve independence with functional mobility    Baseline  300' indoors, curb and ramp x 2 with cane with supervision.  4 stairs with cane and rail alternating sequence to ascend, step to to descend    Time  4    Period  Weeks    Status  Achieved      PT SHORT TERM GOAL #6   Title  Pt will participate in FGA with  no AD with establishment of appropriate LTG    Time  4    Period  Weeks    Status  Achieved        PT Long Term Goals - 07/12/18 1324      PT LONG TERM GOAL #1   Title  (ALL LTG DUE ON 08/06/18)Patient will compliance  and independence with HEP to improve strength, balance, and functional mobility     Time  8    Period  Weeks    Status  New      PT LONG TERM GOAL #2   Title  Pt will improve 5xSTS to </=15 seconds from standard height chair with no UE assist to reduce fall risk potential and demonstrate improvement in LE strength     Time  8    Period  Weeks    Status  Revised      PT LONG TERM GOAL #3   Title  Pt will improve gait speed with her SPC to >/= 2.47 ft/sec and with no AD to >/= 3.09 ft/sec to improve safety and independence with functional mobility     Time  8    Period  Weeks    Status  New      PT LONG TERM GOAL #4   Title  Pt will improve her Berg score to >/= 50/56 indicating reduction in fall risk potential     Time  8    Period  Weeks    Status  New      PT LONG TERM GOAL #5   Title  Pt will ambulate 500 feet outdoors with her SPC navigating obstacles, uneven terrain, curb, ramp and perform 8 steps utilizing reciprocal stepping pattern with cane and single HR with therapist providing supervision to improve independence with community accessibility     Time  8    Period  Weeks    Status  New      PT LONG TERM GOAL #6   Title  Pt will improve FGA score to >19/30 indicating reduction in fall risk potential during dynamic gait    Time  8    Period  Weeks    Status  New            Plan - 07/17/18 1257    Clinical Impression Statement  Treatment session focused on SLS training during functional task of negotiating over obstacles forwards, backwards and laterally to also focus on active weight shifts.  Pt required greatest amount of assistance with backwards negotiation.   Pt demonstrating improved safety and balance when negotiating stairs with alternating sequence; when using cane alone pt can ascend with alternating technique but continues to require rail for descending.  Will continue to progress towards LTG.    Rehab Potential  Good    Clinical Impairments Affecting  Rehab Potential  relies on family for transportation. Pt reports she has high anxiety when ambulating in clinic and is taking medication for this.     PT Frequency  2x / week    PT Duration  8 weeks    PT Treatment/Interventions  ADLs/Self Care Home Management;Balance training;Neuromuscular re-education;DME Instruction;Gait training;Patient/family education;Stair training;Electrical Stimulation;Functional mobility training;Therapeutic activities;Therapeutic exercise;Passive range of motion;Aquatic Therapy    PT Next Visit Plan   TAKE BP ON L SIDE 2/2 TO HX OF R BREAST CANCER**   Continue to work on functional SLS: cone taps, stepping over obstacles forwards, back, sideways (on solid surface and mat), curb, stairs with alternating sequence.  Progress corner balance with partial tandem stance    PT Home Exercise Plan  [phone]     Consulted and Agree with Plan of Care  Patient       Patient will benefit from skilled therapeutic intervention in order to improve the following deficits and impairments:  Abnormal gait, Decreased strength, Decreased balance, Decreased mobility, Postural dysfunction, Decreased activity tolerance, Decreased endurance, Difficulty walking  Visit Diagnosis: Muscle weakness (generalized)  Unsteadiness on feet  Abnormality of gait and mobility  Abnormal posture     Problem List Patient Active Problem List   Diagnosis Date Noted  . 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 Labile blood pressure   . Elevated blood pressure reading   . CKD (chronic kidney disease), stage III ([place])   . Diabetes mellitus type 2 in obese ([place])   . Small vessel disease ([place]) 03/28/2018  . Diabetes mellitus type 2 in nonobese ([facility])   . Anxiety state   . Stage 3 chronic kidney disease ([place])   . Hyperlipidemia   . History of breast cancer   . Acute ischemic stroke ([place])   . TIA (transient ischemic attack) 03/24/2018  . Dyspnea on exertion 05/30/2017  . Sinus tachycardia 05/30/2017   . Mixed hyperlipidemia 05/30/2017  . Essential hypertension 05/30/2017    Rico Junker, PT, DPT 07/17/18    1:00 PM    [facility] [address] [place], [place], [address] Phone: [phone]   Fax:  [phone]  Name: [name] MRN: [id number] Date of Birth: Apr 06, 1941

## 2018-07-18 ENCOUNTER — Ambulatory Visit: Payer: Medicare Other | Admitting: Physical Therapy

## 2018-07-25 ENCOUNTER — Ambulatory Visit: Payer: Medicare Other | Admitting: Physical Therapy

## 2018-07-27 ENCOUNTER — Ambulatory Visit: Payer: Medicare Other | Attending: Physical Medicine & Rehabilitation | Admitting: Physical Therapy

## 2018-07-27 ENCOUNTER — Encounter: Payer: Self-pay | Admitting: Physical Therapy

## 2018-07-27 DIAGNOSIS — R2681 Unsteadiness on feet: Secondary | ICD-10-CM | POA: Insufficient documentation

## 2018-07-27 DIAGNOSIS — R293 Abnormal posture: Secondary | ICD-10-CM | POA: Diagnosis present

## 2018-07-27 DIAGNOSIS — M6281 Muscle weakness (generalized): Secondary | ICD-10-CM | POA: Insufficient documentation

## 2018-07-27 DIAGNOSIS — R269 Unspecified abnormalities of gait and mobility: Secondary | ICD-10-CM | POA: Insufficient documentation

## 2018-07-27 NOTE — Therapy (Signed)
[facility] [address] [place], [place], [address] Phone: [phone]   Fax:  [phone]  Physical Therapy Treatment  Patient Details  Name: [name] MRN: [id number] Date of Birth: 24-Apr-1941 Referring Provider (PT): Dr. Alysia Penna   Encounter Date: 07/27/2018  PT End of Session - 07/27/18 1211    Visit Number  9    Number of Visits  17    Date for PT Re-Evaluation  08/06/18    Authorization Type  [employer] Medicare-* requires 10th visit PN    PT Start Time  1018    PT Stop Time  1103    PT Time Calculation (min)  45 min    Activity Tolerance  Patient tolerated treatment well    Behavior During Therapy  [facility] for tasks assessed/performed       Past Medical History:  Diagnosis Date  . Anxiety   . Back ache   . Breast cancer ([place])    right  . Bronchitis   . Cancer of colon ([place])   . Depression   . DM (diabetes mellitus) ([place])   . Dyspnea on exertion   . Fall on steps    age 60 feel down iron stairs couldnot move leg for 6 mths  . Family history of primary TB   . Fracture of ankle, closed 2014   left  . History of chicken pox   . History of hysterectomy 04/1980  . History of primary TB   . Hyperlipidemia   . Hypertension   . Kidney disease, chronic, stage III (moderate, EGFR 30-59 ml/min) ([place]) 2017  . Kidney disorder    reduction function  . Kidney stones   . Knee pain, right   . Lung abnormality    age 89 patient had pna spot on lungs   . Microalbuminuria   . Muscle cramps    both legs  . Muscular degeneration    of right eye  . Nephrolithiasis   . Palpitations   . Personal history of radiation therapy   . Stroke [facility])     Past Surgical History:  Procedure Laterality Date  . ABLATION  2007   fast heart rate  . ANKLE FRACTURE SURGERY Left 2014  . BREAST LUMPECTOMY Right   . CATARACT EXTRACTION Right 2017  . colonscopy  2015  . HYSTEROTOMY     45 years ago  . TONSILLECTOMY AND  ADENOIDECTOMY     age 67    There were no vitals filed for this visit.  Subjective Assessment - 07/27/18 1020    Subjective  Has had transportation issues the past few visits.  Still having mild LE cramps at night.  Has not had a chance to drive with son recently due to the holidays.    Pertinent History  CVA, Small vessel disease, DM type 2 in nonobese, Stage 67 CKD, HLD, hx of R Breast cancer (TAKE BP ON L SIDE), Colon cancer, TIA, Dyspnea on exertion, Essential HTN, Sinus Tachycardia, Anxiety and depression    Limitations  Lifting;Standing;Walking    How long can you walk comfortably?  Reports she stands to cook, perform laundry, take out garbage and is currently only ambulating around house. She performs walking around neighborhood with her son for 20 min durations with cane.     Patient Stated Goals  Reports goals of driving her car again, walking more with her cane, and return to PLOF.     Currently in Pain?  No/denies  Balance Exercises - 07/27/18 1053      Balance Exercises: Standing   Rockerboard  Lateral;EO;10 reps;UE support   progressed from bilat UE > no UE, min A   Step Over Hurdles / Cones  using floor ladder and wall on R side performing reaching up the wall to R when standing on RLE and advancing LLE; performed 2-3 laps each forwards and retro stepping over ladder and R/L lateral stepping to continue to focus on postural control, weight shifting and balance during SLS.  Required min-mod A for balance and max-total verbal cues to sequence and maintain upright trunk.            PT Short Term Goals - 07/05/18 1111      PT SHORT TERM GOAL #1   Title  Patient will participate in the establishment of an initial HEP to improve strength, balance, and functional mobility     Time  4    Period  Weeks    Status  Achieved      PT SHORT TERM GOAL #2   Title  Pt will improve 5xSTS to </=15 seconds from standard height chair with no  UE assist to reduce fall risk potential and demonstrate improvement in LE strength     Baseline  17 seconds from arm chair without UE support    Time  4    Period  Weeks    Status  Partially Met      PT SHORT TERM GOAL #3   Title  Pt will improve gait speed with her SPC to >/= 2.17 ft/sec and with no AD to >/= 2.79 ft/sec to improve safety and independence with functional mobility     Baseline  2.3 ft/sec with cane; 2.83 without cane    Time  4    Period  Weeks    Status  Achieved      PT SHORT TERM GOAL #4   Title  Pt will improve her Berg score to >/= 46/56 indicating reduction in fall risk potential     Baseline  48/56    Time  4    Period  Weeks    Status  Achieved      PT SHORT TERM GOAL #5   Title  Pt will ambulate 300 feet in clinic using her [facility] navigating obstacles, curb, ramp and perform 4 steps utilizing step to stair pattern with cane and single HR with therapist providing CGA to improve independence with functional mobility    Baseline  300' indoors, curb and ramp x 2 with cane with supervision.  4 stairs with cane and rail alternating sequence to ascend, step to to descend    Time  4    Period  Weeks    Status  Achieved      PT SHORT TERM GOAL #6   Title  Pt will participate in FGA with no AD with establishment of appropriate LTG    Time  4    Period  Weeks    Status  Achieved        PT Long Term Goals - 07/12/18 1324      PT LONG TERM GOAL #1   Title  (ALL LTG DUE ON 08/06/18)Patient will compliance and independence with HEP to improve strength, balance, and functional mobility     Time  8    Period  Weeks    Status  New      PT LONG TERM GOAL #2   Title  Pt will  improve 5xSTS to </=15 seconds from standard height chair with no UE assist to reduce fall risk potential and demonstrate improvement in LE strength     Time  8    Period  Weeks    Status  Revised      PT LONG TERM GOAL #3   Title  Pt will improve gait speed with her SPC to >/= 2.47 ft/sec and  with no AD to >/= 3.09 ft/sec to improve safety and independence with functional mobility     Time  8    Period  Weeks    Status  New      PT LONG TERM GOAL #4   Title  Pt will improve her Berg score to >/= 50/56 indicating reduction in fall risk potential     Time  8    Period  Weeks    Status  New      PT LONG TERM GOAL #5   Title  Pt will ambulate 500 feet outdoors with her [place] navigating obstacles, uneven terrain, curb, ramp and perform 8 steps utilizing reciprocal stepping pattern with cane and single HR with therapist providing supervision to improve independence with community accessibility     Time  8    Period  Weeks    Status  New      PT LONG TERM GOAL #6   Title  Pt will improve FGA score to >19/30 indicating reduction in fall risk potential during dynamic gait    Time  8    Period  Weeks    Status  New            Plan - 07/27/18 1103    Clinical Impression Statement  Continued to utilize stepping over low obstacles (floor ladder) to facilitate functional weight shifting and balance during SLS.  Utilized reaching RUE up wall to facilitate increased weight shift and maintaining weight shift over RLE when stepping with L.  Pt required ongoing tactile and verbal cues to sequence.  Transitioned to rockerboard to continue lateral weight shift training.  Will continue to address and progress to decrease pt reliance on UE for support and to decrease falls risk.    Rehab Potential  Good    Clinical Impairments Affecting Rehab Potential  relies on family for transportation. Pt reports she has high anxiety when ambulating in clinic and is taking medication for this.     PT Frequency  2x / week    PT Duration  8 weeks    PT Treatment/Interventions  ADLs/Self Care Home Management;Balance training;Neuromuscular re-education;DME Instruction;Gait training;Patient/family education;Stair training;Electrical Stimulation;Functional mobility training;Therapeutic activities;Therapeutic  exercise;Passive range of motion;Aquatic Therapy    PT Next Visit Plan   TAKE BP ON L SIDE 2/2 TO HX OF R BREAST CANCER**  10th visit PN - route to [place] after session.  Rockerboard laterally, alternating and lateral step ups, stepping over floor ladder/obstacles forwards, back, sideways (on solid surface and mat), curb, stairs with alternating sequence.  Progress corner balance with partial tandem stance    PT Home Exercise Plan  [phone]     Consulted and Agree with Plan of Care  Patient       Patient will benefit from skilled therapeutic intervention in order to improve the following deficits and impairments:  Abnormal gait, Decreased strength, Decreased balance, Decreased mobility, Postural dysfunction, Decreased activity tolerance, Decreased endurance, Difficulty walking  Visit Diagnosis: Muscle weakness (generalized)  Unsteadiness on feet  Abnormality of gait and mobility  Abnormal posture  Problem List Patient Active Problem List   Diagnosis Date Noted  . 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 Labile blood pressure   . Elevated blood pressure reading   . CKD (chronic kidney disease), stage III ([place])   . Diabetes mellitus type 2 in obese ([place])   . Small vessel disease ([place]) 03/28/2018  . Diabetes mellitus type 2 in nonobese ([facility])   . Anxiety state   . Stage 3 chronic kidney disease ([place])   . Hyperlipidemia   . History of breast cancer   . Acute ischemic stroke ([place])   . TIA (transient ischemic attack) 03/24/2018  . Dyspnea on exertion 05/30/2017  . Sinus tachycardia 05/30/2017  . Mixed hyperlipidemia 05/30/2017  . Essential hypertension 05/30/2017    Rico Junker, PT, DPT 07/27/18    12:16 PM    [facility] [address] [place], [place], [address] Phone: [phone]   Fax:  [phone]  Name: [name] MRN: [id number] Date of Birth: [birth year]

## 2018-07-28 ENCOUNTER — Ambulatory Visit: Payer: Medicare Other | Admitting: Physical Therapy

## 2018-07-28 DIAGNOSIS — M6281 Muscle weakness (generalized): Secondary | ICD-10-CM

## 2018-07-28 DIAGNOSIS — R2681 Unsteadiness on feet: Secondary | ICD-10-CM

## 2018-07-28 DIAGNOSIS — R269 Unspecified abnormalities of gait and mobility: Secondary | ICD-10-CM

## 2018-07-28 NOTE — Therapy (Addendum)
[facility] [address] [place], [place], [address] Phone: [phone]   Fax:  [phone]  Physical Therapy Treatment and 10th visit PN  Patient Details  Name: [name] MRN: [id number] Date of Birth: [birth year] Referring Provider (PT): Dr. Alysia Penna   Encounter Date: 07/28/2018  PT End of Session - 07/28/18 1654    Visit Number  10    Number of Visits  17    Date for PT Re-Evaluation  08/06/18    Authorization Type  [employer] Medicare-* requires 10th visit PN    PT Start Time  1450    PT Stop Time  1530    PT Time Calculation (min)  40 min    Equipment Utilized During Treatment  Gait belt    Activity Tolerance  Patient tolerated treatment well    Behavior During Therapy  [facility] for tasks assessed/performed       Past Medical History:  Diagnosis Date  . Anxiety   . Back ache   . Breast cancer ([place])    right  . Bronchitis   . Cancer of colon ([place])   . Depression   . DM (diabetes mellitus) ([place])   . Dyspnea on exertion   . Fall on steps    age 78 feel down iron stairs couldnot move leg for 6 mths  . Family history of primary TB   . Fracture of ankle, closed 2014   left  . History of chicken pox   . History of hysterectomy 04/1980  . History of primary TB   . Hyperlipidemia   . Hypertension   . Kidney disease, chronic, stage III (moderate, EGFR 30-59 ml/min) ([place]) 2017  . Kidney disorder    reduction function  . Kidney stones   . Knee pain, right   . Lung abnormality    age 78 patient had pna spot spot on lungs   . Microalbuminuria   . Muscle cramps    both legs  . Muscular degeneration    of right eye  . Nephrolithiasis   . Palpitations   . Personal history of radiation therapy   . Stroke [facility])     Past Surgical History:  Procedure Laterality Date  . ABLATION  2007   fast heart rate  . ANKLE FRACTURE SURGERY Left 2014  . BREAST LUMPECTOMY Right   . CATARACT EXTRACTION Right 2017  .  colonscopy  2015  . HYSTEROTOMY     45 years ago  . TONSILLECTOMY AND ADENOIDECTOMY     age 26    There were no vitals filed for this visit.  Subjective Assessment - 07/28/18 1455    Subjective  Denies falls or changes since last visit.  No leg cramps last night.    Pertinent History  CVA, Small vessel disease, DM type 2 in nonobese, Stage 26 CKD, HLD, hx of R Breast cancer (TAKE BP ON L SIDE), Colon cancer, TIA, Dyspnea on exertion, Essential HTN, Sinus Tachycardia, Anxiety and depression    Limitations  Lifting;Standing;Walking    How long can you walk comfortably?  Reports she stands to cook, perform laundry, take out garbage and is currently only ambulating around house. She performs walking around neighborhood with her son for 20 min durations with cane.     Patient Stated Goals  Reports goals of driving her car again, walking more with her cane, and return to PLOF.     Currently in Pain?  No/denies  [facility] Adult PT Treatment/Exercise - 07/28/18 0001      Ambulation/Gait   Stairs  Yes    Stairs Assistance  5: Supervision;4: Min guard    Stair Management Technique  One rail Right;Alternating pattern;Forwards    Number of Stairs  16    Height of Stairs  6      Posture/Postural Control   Posture/Postural Control  Postural limitations    Postural Limitations  Forward head;Rounded Shoulders;Increased thoracic kyphosis;Flexed trunk;Weight shift right       Balance Exercises - 07/28/18 1650      Balance Exercises: Standing   Standing Eyes Opened  Wide (BOA);Foam/compliant surface;Other reps (comment);Other (comment)   >20 reps on red balance beam;also tossing ball;   Stepping Strategy  Anterior;Posterior;Lateral;UE support;Other reps (comment)   > 20 reps;across 4" hurdle all directions;across blue foam   Step Ups  Forward;Lateral;6 inch;UE support 1;Intermittent UE support   >20 reps with 1 UE and 20 with intermittent   Sidestepping  Foam/compliant support;5 reps;Other  (comment)   along blue foam beam       PT Short Term Goals - 07/05/18 1111      PT SHORT TERM GOAL #1   Title  Patient will participate in the establishment of an initial HEP to improve strength, balance, and functional mobility     Time  4    Period  Weeks    Status  Achieved      PT SHORT TERM GOAL #2   Title  Pt will improve 5xSTS to </=15 seconds from standard height chair with no UE assist to reduce fall risk potential and demonstrate improvement in LE strength     Baseline  17 seconds from arm chair without UE support    Time  4    Period  Weeks    Status  Partially Met      PT SHORT TERM GOAL #3   Title  Pt will improve gait speed with her SPC to >/= 2.17 ft/sec and with no AD to >/= 2.79 ft/sec to improve safety and independence with functional mobility     Baseline  2.3 ft/sec with cane; 2.83 without cane    Time  4    Period  Weeks    Status  Achieved      PT SHORT TERM GOAL #4   Title  Pt will improve her Berg score to >/= 46/56 indicating reduction in fall risk potential     Baseline  48/56    Time  4    Period  Weeks    Status  Achieved      PT SHORT TERM GOAL #5   Title  Pt will ambulate 300 feet in clinic using her [facility] navigating obstacles, curb, ramp and perform 4 steps utilizing step to stair pattern with cane and single HR with therapist providing CGA to improve independence with functional mobility    Baseline  300' indoors, curb and ramp x 2 with cane with supervision.  4 stairs with cane and rail alternating sequence to ascend, step to to descend    Time  4    Period  Weeks    Status  Achieved      PT SHORT TERM GOAL #6   Title  Pt will participate in FGA with no AD with establishment of appropriate LTG    Time  4    Period  Weeks    Status  Achieved        PT Long Term  Goals - 07/12/18 1324      PT LONG TERM GOAL #1   Title  (ALL LTG DUE ON 08/06/18)Patient will compliance and independence with HEP to improve strength, balance, and functional  mobility     Time  8    Period  Weeks    Status  New      PT LONG TERM GOAL #2   Title  Pt will improve 5xSTS to </=15 seconds from standard height chair with no UE assist to reduce fall risk potential and demonstrate improvement in LE strength     Time  8    Period  Weeks    Status  Revised      PT LONG TERM GOAL #3   Title  Pt will improve gait speed with her SPC to >/= 2.47 ft/sec and with no AD to >/= 3.09 ft/sec to improve safety and independence with functional mobility     Time  8    Period  Weeks    Status  New      PT LONG TERM GOAL #4   Title  Pt will improve her Berg score to >/= 50/56 indicating reduction in fall risk potential     Time  8    Period  Weeks    Status  New      PT LONG TERM GOAL #5   Title  Pt will ambulate 500 feet outdoors with her SPC navigating obstacles, uneven terrain, curb, ramp and perform 8 steps utilizing reciprocal stepping pattern with cane and single HR with therapist providing supervision to improve independence with community accessibility     Time  8    Period  Weeks    Status  New      PT LONG TERM GOAL #6   Title  Pt will improve FGA score to >19/30 indicating reduction in fall risk potential during dynamic gait    Time  8    Period  Weeks    Status  New            Plan - 07/28/18 1655    Clinical Impression Statement  Skilled session focused on balance on various surfaces and SLS activities.  Continue PT per POC.    Rehab Potential  Good    Clinical Impairments Affecting Rehab Potential  relies on family for transportation. Pt reports she has high anxiety when ambulating in clinic and is taking medication for this.     PT Frequency  2x / week    PT Duration  8 weeks    PT Treatment/Interventions  ADLs/Self Care Home Management;Balance training;Neuromuscular re-education;DME Instruction;Gait training;Patient/family education;Stair training;Electrical Stimulation;Functional mobility training;Therapeutic activities;Therapeutic  exercise;Passive range of motion;Aquatic Therapy    PT Next Visit Plan   TAKE BP ON L SIDE 2/2 TO HX OF R BREAST CANCER** Continue Rockerboard laterally, alternating and lateral step ups, stepping over floor ladder/obstacles forwards, back, sideways (on solid surface and mat), curb, stairs with alternating sequence.  Progress corner balance with partial tandem stance    PT Home Exercise Plan  [phone]     Consulted and Agree with Plan of Care  Patient       Patient will benefit from skilled therapeutic intervention in order to improve the following deficits and impairments:  Abnormal gait, Decreased strength, Decreased balance, Decreased mobility, Postural dysfunction, Decreased activity tolerance, Decreased endurance, Difficulty walking  Visit Diagnosis: Muscle weakness (generalized)  Unsteadiness on feet  Abnormality of gait and mobility     Problem List  Patient Active Problem List   Diagnosis Date Noted  . 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 Labile blood pressure   . Elevated blood pressure reading   . CKD (chronic kidney disease), stage III ([place])   . Diabetes mellitus type 2 in obese ([place])   . Small vessel disease ([place]) 03/28/2018  . Diabetes mellitus type 2 in nonobese ([facility])   . Anxiety state   . Stage 3 chronic kidney disease ([place])   . Hyperlipidemia   . History of breast cancer   . Acute ischemic stroke ([place])   . TIA (transient ischemic attack) 03/24/2018  . Dyspnea on exertion 05/30/2017  . Sinus tachycardia 05/30/2017  . Mixed hyperlipidemia 05/30/2017  . Essential hypertension 05/30/2017   Narda Bonds, PTA [place] 07/28/18 4:59 PM Phone: [phone] Fax: [phone]   10th Visit Physical Therapy Progress Note  Dates of Reporting Period: 06/07/2018 to 07/28/2018  Objective Reports: See above  Objective Measurements: See above  Goal Update: See STG achievement  Plan: Continue with POC; plan to  recertify in 2-3 visits  Reason Skilled Services are Required: to continue to address balance and strength deficits to decrease falls risk and maximize functional mobility independence.  Rico Junker, PT, DPT 07/31/18    12:53 PM   [facility] [address] [place], [place], [address] Phone: [phone]   Fax:  [phone]  Name: Kamber Vignola MRN: [id number] Date of Birth: Dec 16, 1940

## 2018-07-31 ENCOUNTER — Encounter: Payer: Self-pay | Admitting: Physical Therapy

## 2018-07-31 ENCOUNTER — Ambulatory Visit: Payer: Medicare Other | Admitting: Physical Therapy

## 2018-07-31 VITALS — BP 140/90 | HR 89

## 2018-07-31 DIAGNOSIS — M6281 Muscle weakness (generalized): Secondary | ICD-10-CM

## 2018-07-31 DIAGNOSIS — R293 Abnormal posture: Secondary | ICD-10-CM

## 2018-07-31 DIAGNOSIS — R269 Unspecified abnormalities of gait and mobility: Secondary | ICD-10-CM

## 2018-07-31 DIAGNOSIS — R2681 Unsteadiness on feet: Secondary | ICD-10-CM

## 2018-07-31 NOTE — Therapy (Signed)
[facility] [address] [place], [place], [address] Phone: [phone]   Fax:  [phone]  Physical Therapy Treatment  Patient Details  Name: [name] MRN: [id number] Date of Birth: 10-08-1940 Referring Provider (PT): Dr. Alysia Penna   Encounter Date: 07/31/2018  PT End of Session - 07/31/18 1205    Visit Number  11    Number of Visits  17    Date for PT Re-Evaluation  08/06/18    Authorization Type  [employer] Medicare-* requires 10th visit PN    PT Start Time  1106    PT Stop Time  1146    PT Time Calculation (min)  40 min    Activity Tolerance  Patient tolerated treatment well    Behavior During Therapy  [facility] for tasks assessed/performed       Past Medical History:  Diagnosis Date  . Anxiety   . Back ache   . Breast cancer ([place])    right  . Bronchitis   . Cancer of colon ([place])   . Depression   . DM (diabetes mellitus) ([place])   . Dyspnea on exertion   . Fall on steps    age 65 feel down iron stairs couldnot move leg for 6 mths  . Family history of primary TB   . Fracture of ankle, closed 2014   left  . History of chicken pox   . History of hysterectomy 04/1980  . History of primary TB   . Hyperlipidemia   . Hypertension   . Kidney disease, chronic, stage III (moderate, EGFR 30-59 ml/min) ([place]) 2017  . Kidney disorder    reduction function  . Kidney stones   . Knee pain, right   . Lung abnormality    age 89 patient had pna spot on lungs   . Microalbuminuria   . Muscle cramps    both legs  . Muscular degeneration    of right eye  . Nephrolithiasis   . Palpitations   . Personal history of radiation therapy   . Stroke [facility])     Past Surgical History:  Procedure Laterality Date  . ABLATION  2007   fast heart rate  . ANKLE FRACTURE SURGERY Left 2014  . BREAST LUMPECTOMY Right   . CATARACT EXTRACTION Right 2017  . colonscopy  2015  . HYSTEROTOMY     45 years ago  . TONSILLECTOMY AND  ADENOIDECTOMY     age 62    Vitals:   07/31/18 1133  BP: 140/90  Pulse: 89  SpO2: 98%    Subjective Assessment - 07/31/18 1109    Subjective  Drove her car this weekend around the parking lot and home with her son in the car.  Also went to see a movie with her son and walked long distances across the parking lot without any difficulty.      Pertinent History  CVA, Small vessel disease, DM type 2 in nonobese, Stage 3 CKD, HLD, hx of R Breast cancer (TAKE BP ON L SIDE), Colon cancer, TIA, Dyspnea on exertion, Essential HTN, Sinus Tachycardia, Anxiety and depression    Limitations  Lifting;Standing;Walking    How long can you walk comfortably?  Reports she stands to cook, perform laundry, take out garbage and is currently only ambulating around house. She performs walking around neighborhood with her son for 20 min durations with cane.     Patient Stated Goals  Reports goals of driving her car again, walking more with  her cane, and return to PLOF.     Currently in Pain?  No/denies                       [facility] Adult PT Treatment/Exercise - 07/31/18 1115      Therapeutic Activites    Therapeutic Activities  Other Therapeutic Activities    Other Therapeutic Activities  Discussed plan for recertification and addition of visits but decreasing frequency to 1x/week and transitioning to HEP and walking program for D/C.  Pt does not feel she would consistently go to a gym to do exercises but will ask son to buy her a stationary bike to use at home and she has a walking track right outside her house      Exercises   Exercises  Knee/Hip      Knee/Hip Exercises: Aerobic   Nustep  [employer] Fit at level 2 resistance x 8 minutes with bilat UE and LE for aerobic conditioning        Access Code: [email or web address]  URL: [email or web address]  Date: 07/31/2018  Prepared by: Misty Stanley   Exercises all performed in standing with 2lb dumbbells in each UE:  Standing Bicep Curls  Supinated with Dumbbells - 12 reps - 1 sets - 1x daily - 5x weekly  Standing Triceps Extension - 12 reps - 1 sets - 1x daily - 5x weekly  Shoulder Overhead Press in Flexion with Dumbbells - 12 reps - 1 sets - 1x daily - 5x weekly  Standing Shoulder Abduction with Bent Elbow with Dumbbell - 12 reps - 1 sets - 1x daily - 5x weekly Standing Single Arm Scapular Protraction with Dumbbell - 10 reps - 1 sets - 1x daily - 5x weekly       PT Education - 07/31/18 1204    Education Details  discussed plan for future visits; added strengthening UE exercises in standing to HEP.    Person(s) Educated  Patient    Methods  Explanation;Demonstration    Comprehension  Verbalized understanding;Need further instruction;Returned demonstration       PT Short Term Goals - 07/05/18 1111      PT SHORT TERM GOAL #1   Title  Patient will participate in the establishment of an initial HEP to improve strength, balance, and functional mobility     Time  4    Period  Weeks    Status  Achieved      PT SHORT TERM GOAL #2   Title  Pt will improve 5xSTS to </=15 seconds from standard height chair with no UE assist to reduce fall risk potential and demonstrate improvement in LE strength     Baseline  17 seconds from arm chair without UE support    Time  4    Period  Weeks    Status  Partially Met      PT SHORT TERM GOAL #3   Title  Pt will improve gait speed with her SPC to >/= 2.17 ft/sec and with no AD to >/= 2.79 ft/sec to improve safety and independence with functional mobility     Baseline  2.3 ft/sec with cane; 2.83 without cane    Time  4    Period  Weeks    Status  Achieved      PT SHORT TERM GOAL #4   Title  Pt will improve her Berg score to >/= 46/56 indicating reduction in fall risk potential     Baseline  48/56    Time  4    Period  Weeks    Status  Achieved      PT SHORT TERM GOAL #5   Title  Pt will ambulate 300 feet in clinic using her [facility] navigating obstacles, curb, ramp and perform 4  steps utilizing step to stair pattern with cane and single HR with therapist providing CGA to improve independence with functional mobility    Baseline  300' indoors, curb and ramp x 2 with cane with supervision.  4 stairs with cane and rail alternating sequence to ascend, step to to descend    Time  4    Period  Weeks    Status  Achieved      PT SHORT TERM GOAL #6   Title  Pt will participate in FGA with no AD with establishment of appropriate LTG    Time  4    Period  Weeks    Status  Achieved        PT Long Term Goals - 07/12/18 1324      PT LONG TERM GOAL #1   Title  (ALL LTG DUE ON 08/06/18)Patient will compliance and independence with HEP to improve strength, balance, and functional mobility     Time  8    Period  Weeks    Status  New      PT LONG TERM GOAL #2   Title  Pt will improve 5xSTS to </=15 seconds from standard height chair with no UE assist to reduce fall risk potential and demonstrate improvement in LE strength     Time  8    Period  Weeks    Status  Revised      PT LONG TERM GOAL #3   Title  Pt will improve gait speed with her SPC to >/= 2.47 ft/sec and with no AD to >/= 3.09 ft/sec to improve safety and independence with functional mobility     Time  8    Period  Weeks    Status  New      PT LONG TERM GOAL #4   Title  Pt will improve her Berg score to >/= 50/56 indicating reduction in fall risk potential     Time  8    Period  Weeks    Status  New      PT LONG TERM GOAL #5   Title  Pt will ambulate 500 feet outdoors with her SPC navigating obstacles, uneven terrain, curb, ramp and perform 8 steps utilizing reciprocal stepping pattern with cane and single HR with therapist providing supervision to improve independence with community accessibility     Time  8    Period  Weeks    Status  New      PT LONG TERM GOAL #6   Title  Pt will improve FGA score to >19/30 indicating reduction in fall risk potential during dynamic gait    Time  8    Period  Weeks     Status  New            Plan - 07/31/18 1205    Clinical Impression Statement  Plan to begin to assess LTG next session and anticipate pt will benefit from addition of visits to continue to address balance and strength deficits.  Initiated discussion with pt regarding plan for ongoing wellness after D/C; pt is not open to joining a gym or wellness/activity center.  Discussed importance of performing variety of exercises at home: aerobic, strengthening and balance.  Initiated UE  strengthening exercises in standing with 2lb hand weight.  Will also add LE strengthening to HEP next visit.    Rehab Potential  Good    Clinical Impairments Affecting Rehab Potential  relies on family for transportation. Pt reports she has high anxiety when ambulating in clinic and is taking medication for this.     PT Frequency  2x / week    PT Duration  8 weeks    PT Treatment/Interventions  ADLs/Self Care Home Management;Balance training;Neuromuscular re-education;DME Instruction;Gait training;Patient/family education;Stair training;Electrical Stimulation;Functional mobility training;Therapeutic activities;Therapeutic exercise;Passive range of motion;Aquatic Therapy    PT Next Visit Plan   TAKE BP ON L SIDE 2/2 TO HX OF R BREAST CANCER** Begin to check LTG and recertify.  Add LE strengthening to HEP with band or ankle weights.  Did she ask her son about 2lb hand weights?  Can also do aerobic on Nustep/Scifit.    PT Home Exercise Plan  [phone]     Consulted and Agree with Plan of Care  Patient       Patient will benefit from skilled therapeutic intervention in order to improve the following deficits and impairments:  Abnormal gait, Decreased strength, Decreased balance, Decreased mobility, Postural dysfunction, Decreased activity tolerance, Decreased endurance, Difficulty walking  Visit Diagnosis: Muscle weakness (generalized)  Unsteadiness on feet  Abnormality of gait and mobility  Abnormal  posture     Problem List Patient Active Problem List   Diagnosis Date Noted  . 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 Labile blood pressure   . Elevated blood pressure reading   . CKD (chronic kidney disease), stage III ([place])   . Diabetes mellitus type 2 in obese ([place])   . Small vessel disease ([place]) 03/28/2018  . Diabetes mellitus type 2 in nonobese ([facility])   . Anxiety state   . Stage 3 chronic kidney disease ([place])   . Hyperlipidemia   . History of breast cancer   . Acute ischemic stroke ([place])   . TIA (transient ischemic attack) 03/24/2018  . Dyspnea on exertion 05/30/2017  . Sinus tachycardia 05/30/2017  . Mixed hyperlipidemia 05/30/2017  . Essential hypertension 05/30/2017    Rico Junker, PT, DPT 07/31/18    12:10 PM    [facility] [address] [place], [place], [address] Phone: [phone]   Fax:  [phone]  Name: [name] MRN: [id number] Date of Birth: 05/29/1941

## 2018-07-31 NOTE — Patient Instructions (Signed)
  Access Code: [email or web address]  URL: [email or web address]  Date: 07/31/2018  Prepared by: Misty Stanley   Exercises  Standing Bicep Curls Supinated with Dumbbells - 12 reps - 1 sets - 1x daily - 5x weekly  Standing Triceps Extension - 12 reps - 1 sets - 1x daily - 5x weekly  Shoulder Overhead Press in Flexion with Dumbbells - 12 reps - 1 sets - 1x daily - 5x weekly  Standing Shoulder Abduction with Bent Elbow with Dumbbell - 12 reps - 1 sets - 1x daily - 5x weekly  Standing Single Arm Scapular Protraction with Dumbbell - 10 reps - 1 sets - 1x daily - 5x weekly

## 2018-08-03 ENCOUNTER — Encounter: Payer: Self-pay | Admitting: Physical Therapy

## 2018-08-03 ENCOUNTER — Ambulatory Visit: Payer: Medicare Other | Admitting: Physical Therapy

## 2018-08-03 DIAGNOSIS — M6281 Muscle weakness (generalized): Secondary | ICD-10-CM

## 2018-08-03 DIAGNOSIS — R2681 Unsteadiness on feet: Secondary | ICD-10-CM

## 2018-08-03 DIAGNOSIS — R293 Abnormal posture: Secondary | ICD-10-CM

## 2018-08-03 DIAGNOSIS — R269 Unspecified abnormalities of gait and mobility: Secondary | ICD-10-CM

## 2018-08-03 NOTE — Therapy (Signed)
[facility] [address] [place], [place], [address] Phone: [phone]   Fax:  [phone]  Physical Therapy Treatment  Patient Details  Name: [name] MRN: [id number] Date of Birth: 11-09-1940 Referring Provider (PT): Dr. Alysia Penna   Encounter Date: 08/03/2018  PT End of Session - 08/03/18 1148    Visit Number  12    Number of Visits  17    Date for PT Re-Evaluation  08/06/18    Authorization Type  [employer] Medicare-* requires 10th visit PN    PT Start Time  1102    PT Stop Time  1146    PT Time Calculation (min)  44 min    Activity Tolerance  Patient tolerated treatment well    Behavior During Therapy  [facility] for tasks assessed/performed       Past Medical History:  Diagnosis Date  . Anxiety   . Back ache   . Breast cancer ([place])    right  . Bronchitis   . Cancer of colon ([place])   . Depression   . DM (diabetes mellitus) ([place])   . Dyspnea on exertion   . Fall on steps    age 78 feel down iron stairs couldnot move leg for 6 mths  . Family history of primary TB   . Fracture of ankle, closed 2014   left  . History of chicken pox   . History of hysterectomy 04/1980  . History of primary TB   . Hyperlipidemia   . Hypertension   . Kidney disease, chronic, stage III (moderate, EGFR 30-59 ml/min) ([place]) 2017  . Kidney disorder    reduction function  . Kidney stones   . Knee pain, right   . Lung abnormality    age 73 patient had pna spot on lungs   . Microalbuminuria   . Muscle cramps    both legs  . Muscular degeneration    of right eye  . Nephrolithiasis   . Palpitations   . Personal history of radiation therapy   . Stroke [facility]     Past Surgical History:  Procedure Laterality Date  . ABLATION  2007   fast heart rate  . ANKLE FRACTURE SURGERY Left 2014  . BREAST LUMPECTOMY Right   . CATARACT EXTRACTION Right 2017  . colonscopy  2015  . HYSTEROTOMY     45 years ago  . TONSILLECTOMY AND  ADENOIDECTOMY     age 87    There were no vitals filed for this visit.  Subjective Assessment - 08/03/18 1109    Subjective  No changes since Monday.  Asked son for 2lb hand weights, was going to ask for ankle weights but wasn't sure what weight to ask for. Brought in schedule, confused about how to read it - was concerned therapy would conflict with MD appointment    Pertinent History  CVA, Small vessel disease, DM type 2 in nonobese, Stage 87 CKD, HLD, hx of R Breast cancer (TAKE BP ON L SIDE), Colon cancer, TIA, Dyspnea on exertion, Essential HTN, Sinus Tachycardia, Anxiety and depression    Limitations  Lifting;Standing;Walking    How long can you walk comfortably?  Reports she stands to cook, perform laundry, take out garbage and is currently only ambulating around house. She performs walking around neighborhood with her son for 20 min durations with cane.     Patient Stated Goals  Reports goals of driving her car again, walking more with her cane, and return  to PLOF.     Currently in Pain?  No/denies         [employer] PT Assessment - 08/03/18 1114      Assessment   Medical Diagnosis  R pontine CVA    Referring Provider (PT)  Dr. Alysia Penna    Onset Date/Surgical Date  03/24/18    Hand Dominance  Right      Precautions   Precautions  Fall;Other (comment)    Precaution Comments  H/o of breast cancer take BP in L UE      Prior Function   Level of Independence  Independent      Observation/Other Assessments   Focus on Therapeutic Outcomes (FOTO)   not performed      Ambulation/Gait   Ambulation/Gait  Yes    Ambulation/Gait Assistance  5: Supervision    Ambulation/Gait Assistance Details  outside over pavement without cane with intermittent L foot drag and veering    Ambulation Distance (Feet)  500 Feet    Assistive device  None    Gait Pattern  Step-through pattern;Poor foot clearance - left    Ambulation Surface  Unlevel;Outdoor;Paved    Stairs  Yes    Stairs Assistance   4: Min guard    Stairs Assistance Details (indicate cue type and reason)  can ascend without UE support but had one episode of toe catching and then posterior LOB.  Requires one hand rail when descending    Stair Management Technique  One rail Right;Alternating pattern;Forwards    Number of Stairs  8    Height of Stairs  6    Ramp  5: Supervision    Ramp Details (indicate cue type and reason)  L foot drags going up incline    Curb  5: Supervision    Curb Details (indicate cue type and reason)  no AD      Standardized Balance Assessment   Standardized Balance Assessment  Berg Balance Test;10 meter walk test;Five Times Sit to Stand    Five times sit to stand comments   17 seconds, no use of UE    10 Meter Walk  3 ft/sec without cane      Berg Balance Test   Sit to Stand  Able to stand without using hands and stabilize independently    Standing Unsupported  Able to stand safely 2 minutes    Sitting with Back Unsupported but Feet Supported on Floor or Stool  Able to sit safely and securely 2 minutes    Stand to Sit  Sits safely with minimal use of hands    Transfers  Able to transfer safely, minor use of hands    Standing Unsupported with Eyes Closed  Able to stand 10 seconds safely    Standing Ubsupported with Feet Together  Able to place feet together independently and stand 1 minute safely    From Standing, Reach Forward with Outstretched Arm  Can reach confidently >25 cm (10")    From Standing Position, Pick up Object from Floor  Able to pick up shoe safely and easily    From Standing Position, Turn to Look Behind Over each Shoulder  Looks behind from both sides and weight shifts well    Turn 360 Degrees  Able to turn 360 degrees safely but slowly    Standing Unsupported, Alternately Place Feet on Step/Stool  Able to complete 4 steps without aid or supervision    Standing Unsupported, One Foot in Front  Able to plae foot ahead  of the other independently and hold 30 seconds    Standing  on One Leg  Able to lift leg independently and hold equal to or more than 3 seconds    Total Score  49    Berg comment:  49/56      Functional Gait  Assessment   Gait assessed   Yes    Gait Level Surface  Walks 20 ft in less than 7 sec but greater than 5.5 sec, uses assistive device, slower speed, mild gait deviations, or deviates 6-10 in outside of the 12 in walkway width.    Change in Gait Speed  Able to smoothly change walking speed without loss of balance or gait deviation. Deviate no more than 6 in outside of the 12 in walkway width.    Gait with Horizontal Head Turns  Performs head turns smoothly with slight change in gait velocity (eg, minor disruption to smooth gait path), deviates 6-10 in outside 12 in walkway width, or uses an assistive device.    Gait with Vertical Head Turns  Performs head turns with no change in gait. Deviates no more than 6 in outside 12 in walkway width.    Gait and Pivot Turn  Pivot turns safely within 3 sec and stops quickly with no loss of balance.    Step Over Obstacle  Is able to step over one shoe box (4.5 in total height) without changing gait speed. No evidence of imbalance.    Gait with Narrow Base of Support  Ambulates less than 4 steps heel to toe or cannot perform without assistance.    Gait with Eyes Closed  Walks 20 ft, uses assistive device, slower speed, mild gait deviations, deviates 6-10 in outside 12 in walkway width. Ambulates 20 ft in less than 9 sec but greater than 7 sec.    Ambulating Backwards  Walks 20 ft, uses assistive device, slower speed, mild gait deviations, deviates 6-10 in outside 12 in walkway width.    Steps  Alternating feet, must use rail.    Total Score  21    FGA comment:  21/30; 19-24 = medium risk fall                           PT Education - 08/03/18 1147    Education Details  progress towards goals, areas to continue to address over next few visits    Person(s) Educated  Patient    Methods   Explanation    Comprehension  Verbalized understanding       PT Short Term Goals - 07/05/18 1111      PT SHORT TERM GOAL #1   Title  Patient will participate in the establishment of an initial HEP to improve strength, balance, and functional mobility     Time  4    Period  Weeks    Status  Achieved      PT SHORT TERM GOAL #2   Title  Pt will improve 5xSTS to </=15 seconds from standard height chair with no UE assist to reduce fall risk potential and demonstrate improvement in LE strength     Baseline  17 seconds from arm chair without UE support    Time  4    Period  Weeks    Status  Partially Met      PT SHORT TERM GOAL #3   Title  Pt will improve gait speed with her SPC to >/= 2.17 ft/sec and  with no AD to >/= 2.79 ft/sec to improve safety and independence with functional mobility     Baseline  2.3 ft/sec with cane; 2.83 without cane    Time  4    Period  Weeks    Status  Achieved      PT SHORT TERM GOAL #4   Title  Pt will improve her Berg score to >/= 46/56 indicating reduction in fall risk potential     Baseline  48/56    Time  4    Period  Weeks    Status  Achieved      PT SHORT TERM GOAL #5   Title  Pt will ambulate 300 feet in clinic using her [facility] navigating obstacles, curb, ramp and perform 4 steps utilizing step to stair pattern with cane and single HR with therapist providing CGA to improve independence with functional mobility    Baseline  300' indoors, curb and ramp x 2 with cane with supervision.  4 stairs with cane and rail alternating sequence to ascend, step to to descend    Time  4    Period  Weeks    Status  Achieved      PT SHORT TERM GOAL #6   Title  Pt will participate in FGA with no AD with establishment of appropriate LTG    Time  4    Period  Weeks    Status  Achieved        PT Long Term Goals - 08/03/18 1112      PT LONG TERM GOAL #1   Title  (ALL LTG DUE ON 08/06/18)Patient will compliance and independence with HEP to improve strength,  balance, and functional mobility     Time  8    Period  Weeks    Status  Achieved      PT LONG TERM GOAL #2   Title  Pt will improve 5xSTS to </=15 seconds from standard height chair with no UE assist to reduce fall risk potential and demonstrate improvement in LE strength     Baseline  17 seconds    Time  8    Period  Weeks    Status  Not Met      PT LONG TERM GOAL #3   Title  Pt will improve gait speed with her SPC to >/= 2.47 ft/sec and with no AD to >/= 3.09 ft/sec to improve safety and independence with functional mobility     Baseline  3 ft/sec    Time  8    Period  Weeks    Status  Achieved      PT LONG TERM GOAL #4   Title  Pt will improve her Berg score to >/= 50/56 indicating reduction in fall risk potential     Baseline  49/56    Time  8    Period  Weeks    Status  Not Met      PT LONG TERM GOAL #5   Title  Pt will ambulate 500 feet outdoors with her SPC navigating obstacles, uneven terrain, curb, ramp and perform 8 steps utilizing reciprocal stepping pattern with cane and single HR with therapist providing supervision to improve independence with community accessibility     Baseline  500' without cane over uneven pavement with supervision with intermittent L foot drag, curb with supervision, ramps with supervision.  8 stairs with one hand rail with supervision    Time  8    Period  Weeks  Status  Achieved      PT LONG TERM GOAL #6   Title  Pt will improve FGA score to >19/30 indicating reduction in fall risk potential during dynamic gait    Baseline  21/30    Time  8    Period  Weeks    Status  Achieved       New Goals for Re-certification: PT Short Term Goals - 08/03/18 1156      PT SHORT TERM GOAL #1   Title  Pt will demonstrate independence with strengthening UE and LE HEP with hand weights, ankle weights or theraband    Time  4    Period  Weeks    Status  New    Target Date  09/02/18      PT SHORT TERM GOAL #2   Title  5x sit to stand will  decrease to </= 15 seconds without UE support    Baseline  17 seconds    Time  4    Period  Weeks    Status  Revised    Target Date  09/02/18      PT SHORT TERM GOAL #3   Title  Will improve gait velocity to >/= 3.2 ft/sec without cane    Baseline  3 ft/sec    Time  4    Period  Weeks    Status  Revised    Target Date  09/02/18      PT SHORT TERM GOAL #4   Title  Pt will improve her Berg score to >/= 51/56 indicating reduction in fall risk potential     Baseline  49/56    Time  4    Period  Weeks    Status  New    Target Date  09/02/18      PT SHORT TERM GOAL #5   Title  Will improve FGA to >/= 23/30 without use of cane    Baseline  21/30    Time  4    Period  Weeks    Status  New    Target Date  09/02/18      PT Long Term Goals - 08/03/18 1159      PT LONG TERM GOAL #1   Title  Will demonstrate independence with walking program, balance and strengthening HEP    Time  8    Period  Weeks    Target Date  10/02/18      PT LONG TERM GOAL #2   Title  Pt will improve 5xSTS to </=13 seconds from standard height chair with no UE assist to reduce fall risk potential and demonstrate improvement in LE strength     Time  8    Period  Weeks    Status  Revised    Target Date  10/02/18      PT LONG TERM GOAL #3   Title  Will improve gait velocity to >/= 3.4 ft/sec without cane    Time  8    Period  Weeks    Status  Revised    Target Date  10/02/18      PT LONG TERM GOAL #4   Title  Pt will improve her Berg score to >/= 53/56 indicating reduction in fall risk potential     Time  8    Period  Weeks    Status  Revised    Target Date  10/02/18      PT LONG TERM GOAL #5   Title  Pt will ambulate 500 feet outdoors over uneven pavement, up/down curb and over obstacles without AD, supervision.  Will negotiate 8 steps utilizing reciprocal stepping pattern without cane or rail with supervision    Time  8    Period  Weeks    Status  Revised    Target Date  10/02/18      PT  LONG TERM GOAL #6   Title  Pt will improve FGA score to >25/30 indicating reduction in fall risk potential during dynamic gait    Time  8    Period  Weeks    Status  Revised    Target Date  10/02/18           Plan - 08/03/18 1148    Clinical Impression Statement  Treatment session focused on assessment of progress towards LTG.  Pt has made steady progress and has met 4/6 LTG.  Pt did not meet 5 times sit to stand goal or BERG goal as both scores remained the same from 4 week assessment.  Pt demonstrates improved gait velocity and improved safety with gait with slightly decreased falls risk.  Pt will benefit from continued skilled PT services to address ongoing impairments in LE strength, standing balance, safety with gait over uneven surfaces and when negotiating stairs and obstacles to maximize independence and decrease falls risk.    Rehab Potential  Good    Clinical Impairments Affecting Rehab Potential  relies on family for transportation. Pt reports she has high anxiety when ambulating in clinic and is taking medication for this.     PT Frequency  Other (comment)   2x/week the first week + 1x/week for 7 weeks   PT Duration  8 weeks    PT Treatment/Interventions  ADLs/Self Care Home Management;Balance training;Neuromuscular re-education;DME Instruction;Gait training;Patient/family education;Stair training;Electrical Stimulation;Functional mobility training;Therapeutic activities;Therapeutic exercise;Passive range of motion;Aquatic Therapy    PT Next Visit Plan   TAKE BP ON L SIDE 2/2 TO HX OF R BREAST CANCER**  Add LE strengthening to HEP with band or ankle weights. Can also do aerobic on Nustep/Scifit.  Treadmill uphill/downhill.  Stairs without UE support.  stepping over obstacles.    PT Home Exercise Plan  [phone]     Consulted and Agree with Plan of Care  Patient       Patient will benefit from skilled therapeutic intervention in order to improve the following deficits and  impairments:  Abnormal gait, Decreased strength, Decreased balance, Decreased mobility, Postural dysfunction, Decreased activity tolerance, Decreased endurance, Difficulty walking  Visit Diagnosis: Muscle weakness (generalized)  Unsteadiness on feet  Abnormality of gait and mobility  Abnormal posture     Problem List Patient Active Problem List   Diagnosis Date Noted  . 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 Labile blood pressure   . Elevated blood pressure reading   . CKD (chronic kidney disease), stage III ([place])   . Diabetes mellitus type 2 in obese ([place])   . Small vessel disease ([place]) 03/28/2018  . Diabetes mellitus type 2 in nonobese ([facility])   . Anxiety state   . Stage 3 chronic kidney disease ([place])   . Hyperlipidemia   . History of breast cancer   . Acute ischemic stroke ([place])   . TIA (transient ischemic attack) 03/24/2018  . Dyspnea on exertion 05/30/2017  . Sinus tachycardia 05/30/2017  . Mixed hyperlipidemia 05/30/2017  . Essential hypertension 05/30/2017    Rico Junker, PT, DPT 08/03/18    11:54 AM  [facility] [address] [place], [place], [address] Phone: [phone]   Fax:  [phone]  Name: [name] MRN: [id number] Date of Birth: [birth year]

## 2018-08-07 ENCOUNTER — Ambulatory Visit: Payer: Medicare Other | Admitting: Physical Therapy

## 2018-08-10 ENCOUNTER — Encounter: Payer: Self-pay | Admitting: Physical Therapy

## 2018-08-10 ENCOUNTER — Ambulatory Visit: Payer: Medicare Other | Admitting: Physical Therapy

## 2018-08-10 DIAGNOSIS — M6281 Muscle weakness (generalized): Secondary | ICD-10-CM | POA: Diagnosis not present

## 2018-08-10 DIAGNOSIS — R293 Abnormal posture: Secondary | ICD-10-CM

## 2018-08-10 DIAGNOSIS — R269 Unspecified abnormalities of gait and mobility: Secondary | ICD-10-CM

## 2018-08-10 DIAGNOSIS — R2681 Unsteadiness on feet: Secondary | ICD-10-CM

## 2018-08-10 NOTE — Therapy (Signed)
[facility] [address] [place], [place], [address] Phone: [phone]   Fax:  [phone]  Physical Therapy Treatment  Patient Details  Name: [name] MRN: [id number] Date of Birth: 22-Oct-1940 Referring Provider (PT): Dr. Alysia Penna   Encounter Date: 08/10/2018  PT End of Session - 08/10/18 1153    Visit Number  13    Number of Visits  21    Date for PT Re-Evaluation  10/02/18    Authorization Type  [employer] Medicare-* requires 10th visit PN    PT Start Time  1100    PT Stop Time  1145    PT Time Calculation (min)  45 min    Activity Tolerance  Patient tolerated treatment well    Behavior During Therapy  [facility] for tasks assessed/performed       Past Medical History:  Diagnosis Date  . Anxiety   . Back ache   . Breast cancer ([place])    right  . Bronchitis   . Cancer of colon ([place])   . Depression   . DM (diabetes mellitus) ([place])   . Dyspnea on exertion   . Fall on steps    age 74 feel down iron stairs couldnot move leg for 6 mths  . Family history of primary TB   . Fracture of ankle, closed 2014   left  . History of chicken pox   . History of hysterectomy 04/1980  . History of primary TB   . Hyperlipidemia   . Hypertension   . Kidney disease, chronic, stage III (moderate, EGFR 30-59 ml/min) ([place]) 2017  . Kidney disorder    reduction function  . Kidney stones   . Knee pain, right   . Lung abnormality    age 63 patient had pna spot on lungs   . Microalbuminuria   . Muscle cramps    both legs  . Muscular degeneration    of right eye  . Nephrolithiasis   . Palpitations   . Personal history of radiation therapy   . Stroke [facility])     Past Surgical History:  Procedure Laterality Date  . ABLATION  2007   fast heart rate  . ANKLE FRACTURE SURGERY Left 2014  . BREAST LUMPECTOMY Right   . CATARACT EXTRACTION Right 2017  . colonscopy  2015  . HYSTEROTOMY     45 years ago  . TONSILLECTOMY AND  ADENOIDECTOMY     age 63    There were no vitals filed for this visit.  Subjective Assessment - 08/10/18 1103    Subjective  Doesn't think Dr. Letta Pate will let her drive but has not had reliable transportation with [employer].  Other than transportation, pt is doing well.  Got 2lb hand weights but is unsure of how to do the exercises.    Pertinent History  CVA, Small vessel disease, DM type 2 in nonobese, Stage 63 CKD, HLD, hx of R Breast cancer (TAKE BP ON L SIDE), Colon cancer, TIA, Dyspnea on exertion, Essential HTN, Sinus Tachycardia, Anxiety and depression    Limitations  Lifting;Standing;Walking    How long can you walk comfortably?  Reports she stands to cook, perform laundry, take out garbage and is currently only ambulating around house. She performs walking around neighborhood with her son for 20 min durations with cane.     Patient Stated Goals  Reports goals of driving her car again, walking more with her cane, and return to PLOF.  Currently in Pain?  No/denies                       [facility] Adult PT Treatment/Exercise - 08/10/18 1148      Exercises   Exercises  Shoulder      Knee/Hip Exercises: Standing   Hip Flexion  Stengthening;Right;Left;1 set;10 reps;Knee bent    Hip Flexion Limitations  green theraband resistance    Hip ADduction  Strengthening;Right;Left;1 set;10 reps    Hip ADduction Limitations  green theraband resistance    Hip Abduction  Stengthening;Right;Left;1 set;10 reps    Abduction Limitations  green theraband resistance    Hip Extension  Stengthening;Right;Left;1 set;10 reps;Knee straight    Extension Limitations  green theraband resistance      Shoulder Exercises: Standing   Protraction  Strengthening;Right;Left;10 reps;Weights    Protraction Weight (lbs)  2    External Rotation  Strengthening;Right;Left;10 reps;Weights    External Rotation Weight (lbs)  2    Flexion  Strengthening;Right;Left;10 reps;Weights    Shoulder Flexion Weight  (lbs)  2    ABduction  Strengthening;Right;Left;10 reps;Weights    Shoulder ABduction Weight (lbs)  2    Extension  Strengthening;Right;Left;10 reps;Weights    Extension Weight (lbs)  2    Other Standing Exercises  Strengthening, Right and Left, Bicep curl, 1 set x 10 reps with 2lb weight             PT Education - 08/10/18 1153    Education Details  Practice driving from home <> Neuro clinic with son in the car this weekend and then discuss driving with physiatrist this weekend.  Continue with UE exercises, adding shoulder ER.  Introduction to 4 way hip    Person(s) Educated  Patient    Methods  Explanation;Demonstration    Comprehension  Verbalized understanding;Returned demonstration;Need further instruction       PT Short Term Goals - 08/03/18 1156      PT SHORT TERM GOAL #1   Title  Pt will demonstrate independence with strengthening UE and LE HEP with hand weights, ankle weights or theraband    Time  4    Period  Weeks    Status  New    Target Date  09/02/18      PT SHORT TERM GOAL #2   Title  5x sit to stand will decrease to </= 15 seconds without UE support    Baseline  17 seconds    Time  4    Period  Weeks    Status  Revised    Target Date  09/02/18      PT SHORT TERM GOAL #3   Title  Will improve gait velocity to >/= 3.2 ft/sec without cane    Baseline  3 ft/sec    Time  4    Period  Weeks    Status  Revised    Target Date  09/02/18      PT SHORT TERM GOAL #4   Title  Pt will improve her Berg score to >/= 51/56 indicating reduction in fall risk potential     Baseline  49/56    Time  4    Period  Weeks    Status  New    Target Date  09/02/18      PT SHORT TERM GOAL #5   Title  Will improve FGA to >/= 23/30 without use of cane    Baseline  21/30    Time  4  Period  Weeks    Status  New    Target Date  09/02/18        PT Long Term Goals - 08/03/18 1159      PT LONG TERM GOAL #1   Title  Will demonstrate independence with walking program,  balance and strengthening HEP    Time  8    Period  Weeks    Target Date  10/02/18      PT LONG TERM GOAL #2   Title  Pt will improve 5xSTS to </=13 seconds from standard height chair with no UE assist to reduce fall risk potential and demonstrate improvement in LE strength     Time  8    Period  Weeks    Status  Revised    Target Date  10/02/18      PT LONG TERM GOAL #3   Title  Will improve gait velocity to >/= 3.4 ft/sec without cane    Time  8    Period  Weeks    Status  Revised    Target Date  10/02/18      PT LONG TERM GOAL #4   Title  Pt will improve her Berg score to >/= 53/56 indicating reduction in fall risk potential     Time  8    Period  Weeks    Status  Revised    Target Date  10/02/18      PT LONG TERM GOAL #5   Title  Pt will ambulate 500 feet outdoors over uneven pavement, up/down curb and over obstacles without AD, supervision.  Will negotiate 8 steps utilizing reciprocal stepping pattern without cane or rail with supervision    Time  8    Period  Weeks    Status  Revised    Target Date  10/02/18      PT LONG TERM GOAL #6   Title  Pt will improve FGA score to >25/30 indicating reduction in fall risk potential during dynamic gait    Time  8    Period  Weeks    Status  Revised    Target Date  10/02/18            Plan - 08/10/18 1154    Clinical Impression Statement  Continued to review patient's goals for driving and recommended pt practice with son in the car this weekend before appointment with physiatrist on Monday.  Reviewed UE exercises for home providing pt with visual cues and tactile cues.  Added shoulder ER to HEP.  Initiated 4 way hip strengthening with theraband.  Will continue to review and ensure safety at next visit    Rehab Potential  Good    Clinical Impairments Affecting Rehab Potential  relies on family for transportation. Pt reports she has high anxiety when ambulating in clinic and is taking medication for this.     PT Frequency   Other (comment)   2x/week the first week + 1x/week for 7 weeks   PT Duration  8 weeks    PT Treatment/Interventions  ADLs/Self Care Home Management;Balance training;Neuromuscular re-education;DME Instruction;Gait training;Patient/family education;Stair training;Electrical Stimulation;Functional mobility training;Therapeutic activities;Therapeutic exercise;Passive range of motion;Aquatic Therapy    PT Next Visit Plan   TAKE BP ON L SIDE 2/2 TO HX OF R BREAST CANCER**  Review 4 way hip with green theraband; give handout if safe. Can also do aerobic on Nust[place]ifit.  Treadmill uphill/downhill.  Stairs without UE support.  stepping over obstacles.    PT  Home Exercise Plan  [phone]     Consulted and Agree with Plan of Care  Patient       Patient will benefit from skilled therapeutic intervention in order to improve the following deficits and impairments:  Abnormal gait, Decreased strength, Decreased balance, Decreased mobility, Postural dysfunction, Decreased activity tolerance, Decreased endurance, Difficulty walking  Visit Diagnosis: Muscle weakness (generalized)  Unsteadiness on feet  Abnormality of gait and mobility  Abnormal posture     Problem List Patient Active Problem List   Diagnosis Date Noted  . 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 Labile blood pressure   . Elevated blood pressure reading   . CKD (chronic kidney disease), stage III ([place])   . Diabetes mellitus type 2 in obese ([place])   . Small vessel disease ([place]) 03/28/2018  . Diabetes mellitus type 2 in nonobese ([facility])   . Anxiety state   . Stage 3 chronic kidney disease ([place])   . Hyperlipidemia   . History of breast cancer   . Acute ischemic stroke ([place])   . TIA (transient ischemic attack) 03/24/2018  . Dyspnea on exertion 05/30/2017  . Sinus tachycardia 05/30/2017  . Mixed hyperlipidemia 05/30/2017  . Essential hypertension 05/30/2017    Rico Junker, PT, DPT 08/10/18    11:56 AM    [facility] [address] [place], [place], [address] Phone: [phone]   Fax:  [phone]  Name: [name] MRN: [id number] Date of Birth: 1941/06/28

## 2018-08-10 NOTE — Patient Instructions (Signed)
  Access Code: [email or web address]  URL: [email or web address]  Date: 07/31/2018  Prepared by: Misty Stanley   Exercises  Standing Bicep Curls Supinated with Dumbbells - 12 reps - 1 sets - 1x daily - 5x weekly  Standing Triceps Extension - 12 reps - 1 sets - 1x daily - 5x weekly  Shoulder Overhead Press in Flexion with Dumbbells - 12 reps - 1 sets - 1x daily - 5x weekly  Standing Shoulder Abduction with Bent Elbow with Dumbbell - 12 reps - 1 sets - 1x daily - 5x weekly  Standing Single Arm Scapular Protraction with Dumbbell - 10 reps - 1 sets - 1x daily - 5x weekly

## 2018-08-14 ENCOUNTER — Other Ambulatory Visit: Payer: Self-pay

## 2018-08-14 ENCOUNTER — Ambulatory Visit: Payer: Medicare Other | Admitting: Physical Medicine & Rehabilitation

## 2018-08-14 ENCOUNTER — Encounter: Payer: Medicare Other | Attending: Physical Medicine & Rehabilitation

## 2018-08-14 ENCOUNTER — Encounter: Payer: Self-pay | Admitting: Physical Medicine & Rehabilitation

## 2018-08-14 VITALS — BP 128/82 | HR 89 | Ht 67.5 in | Wt 185.6 lb

## 2018-08-14 DIAGNOSIS — R269 Unspecified abnormalities of gait and mobility: Secondary | ICD-10-CM | POA: Diagnosis not present

## 2018-08-14 DIAGNOSIS — E785 Hyperlipidemia, unspecified: Secondary | ICD-10-CM | POA: Insufficient documentation

## 2018-08-14 DIAGNOSIS — E1122 Type 2 diabetes mellitus with diabetic chronic kidney disease: Secondary | ICD-10-CM | POA: Diagnosis not present

## 2018-08-14 DIAGNOSIS — Z853 Personal history of malignant neoplasm of breast: Secondary | ICD-10-CM | POA: Insufficient documentation

## 2018-08-14 DIAGNOSIS — I639 Cerebral infarction, unspecified: Secondary | ICD-10-CM | POA: Insufficient documentation

## 2018-08-14 DIAGNOSIS — I69398 Other sequelae of cerebral infarction: Secondary | ICD-10-CM | POA: Diagnosis not present

## 2018-08-14 DIAGNOSIS — I6389 Other cerebral infarction: Secondary | ICD-10-CM

## 2018-08-14 DIAGNOSIS — N183 Chronic kidney disease, stage 3 (moderate): Secondary | ICD-10-CM | POA: Insufficient documentation

## 2018-08-14 DIAGNOSIS — Z923 Personal history of irradiation: Secondary | ICD-10-CM | POA: Insufficient documentation

## 2018-08-14 DIAGNOSIS — I69354 Hemiplegia and hemiparesis following cerebral infarction affecting left non-dominant side: Secondary | ICD-10-CM

## 2018-08-14 DIAGNOSIS — F329 Major depressive disorder, single episode, unspecified: Secondary | ICD-10-CM | POA: Insufficient documentation

## 2018-08-14 DIAGNOSIS — I129 Hypertensive chronic kidney disease with stage 1 through stage 4 chronic kidney disease, or unspecified chronic kidney disease: Secondary | ICD-10-CM | POA: Diagnosis not present

## 2018-08-14 NOTE — Patient Instructions (Signed)

## 2018-08-14 NOTE — Progress Notes (Signed)
Subjective:    Patient ID: [name], female    DOB: 10/09/1940, 78 y.o.   MRN: [id number]  HPI 78 year old right-handed female with history of tachycardia, status post ablation in 2007, diabetes mellitus, CKD stage III.  Lives alone, independent prior to admission.  Family provides Doctor, hospital.  A 1-level home.   Presented 03/24/2018 with left-sided weakness, facial droop, slurred speech.  Cranial CT scan negative.  The patient did receive tPA.  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  No significant proximal  stenosis, aneurysm or branch vessel occlusion.  Echocardiogram with ejection fraction of 28%, grade I diastolic dysfunction.  Carotid Dopplers negative for ICA stenosis.  Neurology consulted.  Placed on aspirin and Plavix therapy ADMIT DATE:  03/28/2018   DISCHARGE DATE:  04/12/2018  Ambulates without cane in house Uses cane outside  Does HEP Down to weekly on PT, OT finished a couple weeks ago  Pain Inventory Average Pain 0 Pain Right Now 0 My pain is na  In the last 24 hours, has pain interfered with the following? General activity 0 Relation with others 0 Enjoyment of life 0 What TIME of day is your pain at its worst? na Sleep (in general) Fair  Pain is worse with: na Pain improves with: na Relief from Meds: na  Mobility walk with assistance use a cane ability to climb steps?  yes do you drive?  no  Function retired  Neuro/Psych anxiety  Prior Studies Any changes since last visit?  no  Physicians involved in your care Any changes since last visit?  no   Family History  Problem Relation Age of Onset  . Peripheral Artery Disease Mother 35       HX OF POOR CIRCULATION, SMOKING, LEG AMPUTATION  . Cancer Father        cancer of spine  . Asthma Brother 3  . Kidney Stones Brother   . Breast cancer Neg Hx    Social History   Socioeconomic History  . Marital status: Divorced    Spouse  name: Not on file  . Number of children: 2  . Years of education: Not on file  . Highest education level: Not on file  Occupational History  . Not on file  Social Needs  . Financial resource strain: Not on file  . Food insecurity:    Worry: Not on file    Inability: Not on file  . Transportation needs:    Medical: Not on file    Non-medical: Not on file  Tobacco Use  . Smoking status: Never Smoker  . Smokeless tobacco: Never Used  Substance and Sexual Activity  . Alcohol use: No  . Drug use: No  . Sexual activity: Not Currently  Lifestyle  . Physical activity:    Days per week: Not on file    Minutes per session: Not on file  . Stress: Not on file  Relationships  . Social connections:    Talks on phone: Not on file    Gets together: Not on file    Attends religious service: Not on file    Active member of club or organization: Not on file    Attends meetings of clubs or organizations: Not on file    Relationship status: Not on file  Other Topics Concern  . Not on file  Social History Narrative  . Not on file   Past Surgical History:  Procedure Laterality Date  . ABLATION  2007   fast heart rate  . ANKLE FRACTURE SURGERY Left 2014  . BREAST LUMPECTOMY Right   . CATARACT EXTRACTION Right 2017  . colonscopy  2015  . HYSTEROTOMY     45 years ago  . TONSILLECTOMY AND ADENOIDECTOMY     age 74   Past Medical History:  Diagnosis Date  . Anxiety   . Back ache   . Breast cancer ([place])    right  . Bronchitis   . Cancer of colon ([place])   . Depression   . DM (diabetes mellitus) ([place])   . Dyspnea on exertion   . Fall on steps    age 71 feel down iron stairs couldnot move leg for 6 mths  . Family history of primary TB   . Fracture of ankle, closed 2014   left  . History of chicken pox   . History of hysterectomy 04/1980  . History of primary TB   . Hyperlipidemia   . Hypertension   . Kidney disease, chronic, stage III (moderate, EGFR 30-59 ml/min) ([place]) 2017  .  Kidney disorder    reduction function  . Kidney stones   . Knee pain, right   . Lung abnormality    age 61 patient had pna spot on lungs   . Microalbuminuria   . Muscle cramps    both legs  . Muscular degeneration    of right eye  . Nephrolithiasis   . Palpitations   . Personal history of radiation therapy   . Stroke ([place])    BP 128/82   Pulse 89   Ht 5' 7.5" (1.715 m)   Wt 185 lb 9.6 oz (84.2 kg)   SpO2 97%   BMI 28.64 kg/m   Opioid Risk Score:   Fall Risk Score:  `1  Depression screen PHQ 2/9  Depression screen [facility] 2/9 08/14/2018 04/25/2018  Decreased Interest 1 0  Down, Depressed, Hopeless 1 0  PHQ - 2 Score 2 0  Altered sleeping - 0  Tired, decreased energy - 1  Change in appetite - 0  Feeling bad or failure about yourself  - 0  Trouble concentrating - 1  Moving slowly or fidgety/restless - 0  Suicidal thoughts - 0  PHQ-9 Score - 2  Difficult doing work/chores - Somewhat difficult   Review of Systems  Constitutional: Negative.   HENT: Negative.   Eyes: Negative.   Respiratory: Negative.   Cardiovascular: Negative.   Gastrointestinal: Negative.   Endocrine: Negative.   Genitourinary: Negative.   Musculoskeletal: Negative.   Skin: Negative.   Allergic/Immunologic: Negative.   Neurological: Negative.   Hematological: Negative.   Psychiatric/Behavioral: Negative.   All other systems reviewed and are negative.      Objective:   Physical Exam Vitals signs and nursing note reviewed.  Constitutional:      Appearance: Normal appearance.  HENT:     Head: Normocephalic.     Nose: Nose normal.     Mouth/Throat:     Mouth: Mucous membranes are moist.  Eyes:     Extraocular Movements: Extraocular movements intact.     Pupils: Pupils are equal, round, and reactive to light.     Visual Fields: Right eye visual fields normal and left eye visual fields normal.  Musculoskeletal:     Right shoulder: She exhibits normal range of motion.     Left shoulder:  She exhibits normal range of motion.     Cervical back: She exhibits normal range of motion.  Comments: Hold Left arm in a flexed position during ambulation but can be cued to extend arm  Neurological:     Mental Status: She is alert and oriented to person, place, and time.     Gait: Tandem walk abnormal. Gait normal.     Comments: Motor 5/5 in Bilateral delt, Bi, tri, grip, HF, KE, ADF           Assessment & Plan:  1. 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  Still has balance issues but improving Berg scores.  Cont OP PT  We discused driving at some length, input from OP OT , no serious concerns.  Visual fields ok, no motor def or cognitive def to interfere with driving  Graduated return to driving instructions were provided. It is recommended that the patient first drives with another licensed driver in an empty parking lot. If the patient does well with this, and they can drive on a quiet street with the licensed driver. If the patient does well with this they can drive on a busy street with a licensed driver. If the patient does well with this, the next time out they can go by himself. For the first month after resuming driving, I recommend no nighttime or Interstate driving.   RTC 2 mo, if doing well may be prn

## 2018-08-17 ENCOUNTER — Encounter: Payer: Self-pay | Admitting: Physical Therapy

## 2018-08-17 ENCOUNTER — Ambulatory Visit: Payer: Medicare Other | Admitting: Physical Therapy

## 2018-08-17 DIAGNOSIS — R269 Unspecified abnormalities of gait and mobility: Secondary | ICD-10-CM

## 2018-08-17 DIAGNOSIS — R2681 Unsteadiness on feet: Secondary | ICD-10-CM

## 2018-08-17 DIAGNOSIS — M6281 Muscle weakness (generalized): Secondary | ICD-10-CM

## 2018-08-17 DIAGNOSIS — R293 Abnormal posture: Secondary | ICD-10-CM

## 2018-08-17 NOTE — Therapy (Signed)
[facility] [address] [place], [place], [address] Phone: [phone]   Fax:  [phone]  Physical Therapy Treatment  Patient Details  Name: [name] MRN: [id number] Date of Birth: 12-Jun-1941 Referring Provider (PT): Dr. Alysia Penna   Encounter Date: 08/17/2018  PT End of Session - 08/17/18 1648    Visit Number  14    Number of Visits  21    Date for PT Re-Evaluation  10/02/18    Authorization Type  [employer] Medicare-* requires 10th visit PN    PT Start Time  1448    PT Stop Time  1531    PT Time Calculation (min)  43 min    Activity Tolerance  Patient tolerated treatment well    Behavior During Therapy  [facility] for tasks assessed/performed       Past Medical History:  Diagnosis Date  . Anxiety   . Back ache   . Breast cancer ([place])    right  . Bronchitis   . Cancer of colon ([place])   . Depression   . DM (diabetes mellitus) ([place])   . Dyspnea on exertion   . Fall on steps    age 28 feel down iron stairs couldnot move leg for 6 mths  . Family history of primary TB   . Fracture of ankle, closed 2014   left  . History of chicken pox   . History of hysterectomy 04/1980  . History of primary TB   . Hyperlipidemia   . Hypertension   . Kidney disease, chronic, stage III (moderate, EGFR 30-59 ml/min) ([place]) 2017  . Kidney disorder    reduction function  . Kidney stones   . Knee pain, right   . Lung abnormality    age 78 patient had pna spot on lungs   . Microalbuminuria   . Muscle cramps    both legs  . Muscular degeneration    of right eye  . Nephrolithiasis   . Palpitations   . Personal history of radiation therapy   . Stroke [facility])     Past Surgical History:  Procedure Laterality Date  . ABLATION  2007   fast heart rate  . ANKLE FRACTURE SURGERY Left 2014  . BREAST LUMPECTOMY Right   . CATARACT EXTRACTION Right 2017  . colonscopy  2015  . HYSTEROTOMY     45 years ago  . TONSILLECTOMY AND  ADENOIDECTOMY     age 64    There were no vitals filed for this visit.  Subjective Assessment - 08/17/18 1453    Subjective  Had appointment with Dr. Letta Pate who gave her clearance to drive gradually.  Did not get to drive this past weekend because son's kids had the flu.  Soreness after LE exercises, lasted 2 days.      Pertinent History  CVA, Small vessel disease, DM type 2 in nonobese, Stage 64 CKD, HLD, hx of R Breast cancer (TAKE BP ON L SIDE), Colon cancer, TIA, Dyspnea on exertion, Essential HTN, Sinus Tachycardia, Anxiety and depression    Limitations  Lifting;Standing;Walking    How long can you walk comfortably?  Reports she stands to cook, perform laundry, take out garbage and is currently only ambulating around house. She performs walking around neighborhood with her son for 20 min durations with cane.     Patient Stated Goals  Reports goals of driving her car again, walking more with her cane, and return to PLOF.     Currently  in Pain?  No/denies         Access Code: [email or web address]  URL: [email or web address]  Date: 08/17/2018  Prepared by: Misty Stanley   Balance Exercises reviewed and consolidated with patient today: Heel Toe Raises with Counter Support - 10 reps - 2 sets - 1x daily - 7x weekly  Backward Walking with Counter Support - 4 reps - 2x daily - 7x weekly  Standing Single Leg Stance with Counter Support - 3 sets - 10 seconds hold - 1x daily - 7x weekly  Tandem Stance with Eyes Closed - 2 sets - 10 SECONDS hold - 1x daily - 7x weekly  Tandem Stance with Head Rotation - 10 reps - 2 sets - 1x daily - 7x weekly  Tandem Stance with Head Nods - 10 reps - 2 sets - 1x daily - 7x weekly    Also reviewed 4 way hip exercises with green theraband resistance with mod cues for safety when donning band and sequencing of turning to perform each exercise.  Standing Repeated Hip Abduction with Resistance - 10 reps - 2 sets - 1x daily - 5x weekly  Hip and Knee Flexion  with Anchored Resistance - 10 reps - 2 sets - 1x daily - 5x weekly  Standing Repeated Hip Adduction with Resistance - 10 reps - 2 sets - 1x daily - 5x weekly  Standing Repeated Hip Extension with Resistance - 10 reps - 3 sets - 1x daily - 5x weekly         PT Education - 08/17/18 1648    Education Details  consolidated HEP    Person(s) Educated  Patient    Methods  Explanation;Demonstration;Handout    Comprehension  Verbalized understanding;Returned demonstration       PT Short Term Goals - 08/03/18 1156      PT SHORT TERM GOAL #1   Title  Pt will demonstrate independence with strengthening UE and LE HEP with hand weights, ankle weights or theraband    Time  4    Period  Weeks    Status  New    Target Date  09/02/18      PT SHORT TERM GOAL #2   Title  5x sit to stand will decrease to </= 15 seconds without UE support    Baseline  17 seconds    Time  4    Period  Weeks    Status  Revised    Target Date  09/02/18      PT SHORT TERM GOAL #3   Title  Will improve gait velocity to >/= 3.2 ft/sec without cane    Baseline  3 ft/sec    Time  4    Period  Weeks    Status  Revised    Target Date  09/02/18      PT SHORT TERM GOAL #4   Title  Pt will improve her Berg score to >/= 51/56 indicating reduction in fall risk potential     Baseline  49/56    Time  4    Period  Weeks    Status  New    Target Date  09/02/18      PT SHORT TERM GOAL #5   Title  Will improve FGA to >/= 23/30 without use of cane    Baseline  21/30    Time  4    Period  Weeks    Status  New    Target Date  09/02/18  PT Long Term Goals - 08/03/18 1159      PT LONG TERM GOAL #1   Title  Will demonstrate independence with walking program, balance and strengthening HEP    Time  8    Period  Weeks    Target Date  10/02/18      PT LONG TERM GOAL #2   Title  Pt will improve 5xSTS to </=13 seconds from standard height chair with no UE assist to reduce fall risk potential and demonstrate  improvement in LE strength     Time  8    Period  Weeks    Status  Revised    Target Date  10/02/18      PT LONG TERM GOAL #3   Title  Will improve gait velocity to >/= 3.4 ft/sec without cane    Time  8    Period  Weeks    Status  Revised    Target Date  10/02/18      PT LONG TERM GOAL #4   Title  Pt will improve her Berg score to >/= 53/56 indicating reduction in fall risk potential     Time  8    Period  Weeks    Status  Revised    Target Date  10/02/18      PT LONG TERM GOAL #5   Title  Pt will ambulate 500 feet outdoors over uneven pavement, up/down curb and over obstacles without AD, supervision.  Will negotiate 8 steps utilizing reciprocal stepping pattern without cane or rail with supervision    Time  8    Period  Weeks    Status  Revised    Target Date  10/02/18      PT LONG TERM GOAL #6   Title  Pt will improve FGA score to >25/30 indicating reduction in fall risk potential during dynamic gait    Time  8    Period  Weeks    Status  Revised    Target Date  10/02/18            Plan - 08/17/18 1648    Clinical Impression Statement  Treatment session focused on review of 4 way standing hip strengthening exercises to determine if pt will be safe to perform at home.  Pt continued to require mod cues so will continue to review but exercises added to HEP.  Also reviewed and consolidated balance HEP.  Provided pt with extensive/comprehensive HEP that includes balance exercises, UE strengthening and LE strengthening.  Will continue to progress towards LTG.    Rehab Potential  Good    Clinical Impairments Affecting Rehab Potential  relies on family for transportation. Pt reports she has high anxiety when ambulating in clinic and is taking medication for this.     PT Frequency  Other (comment)   2x/week the first week + 1x/week for 7 weeks   PT Duration  8 weeks    PT Treatment/Interventions  ADLs/Self Care Home Management;Balance training;Neuromuscular re-education;DME  Instruction;Gait training;Patient/family education;Stair training;Electrical Stimulation;Functional mobility training;Therapeutic activities;Therapeutic exercise;Passive range of motion;Aquatic Therapy    PT Next Visit Plan   TAKE BP ON L SIDE 2/2 TO HX OF R BREAST CANCER**  Review 4 way hip one more time and provide pt with green theraband.  Can also do aerobic on Nustep/Scifit.  Treadmill uphill/downhill.  Stairs without UE support.  stepping over obstacles; gait without AD    PT Home Exercise Plan  [phone]     Consulted and  Agree with Plan of Care  Patient       Patient will benefit from skilled therapeutic intervention in order to improve the following deficits and impairments:  Abnormal gait, Decreased strength, Decreased balance, Decreased mobility, Postural dysfunction, Decreased activity tolerance, Decreased endurance, Difficulty walking  Visit Diagnosis: Muscle weakness (generalized)  Unsteadiness on feet  Abnormality of gait and mobility  Abnormal posture     Problem List Patient Active Problem List   Diagnosis Date Noted  . 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 Labile blood pressure   . Elevated blood pressure reading   . CKD (chronic kidney disease), stage III ([place])   . Diabetes mellitus type 2 in obese ([place])   . Small vessel disease ([place]) 03/28/2018  . Diabetes mellitus type 2 in nonobese ([facility])   . Anxiety state   . Stage 3 chronic kidney disease ([place])   . Hyperlipidemia   . History of breast cancer   . Acute ischemic stroke ([place])   . TIA (transient ischemic attack) 03/24/2018  . Dyspnea on exertion 05/30/2017  . Sinus tachycardia 05/30/2017  . Mixed hyperlipidemia 05/30/2017  . Essential hypertension 05/30/2017    Rico Junker, PT, DPT 08/17/18    4:54 PM    [facility] [address] [place], [place], [address] Phone: [phone]   Fax:  [phone]  Name: Jamella Grayer MRN:  [id number] Date of Birth: [birth year]

## 2018-08-17 NOTE — Patient Instructions (Addendum)
Access Code: [email or web address]  URL: [email or web address]  Date: 08/17/2018  Prepared by: Misty Stanley   Exercises  Heel Toe Raises with Counter Support - 10 reps - 2 sets - 1x daily - 7x weekly  Backward Walking with Counter Support - 4 reps - 2x daily - 7x weekly  Standing Single Leg Stance with Counter Support - 3 sets - 10 seconds hold - 1x daily - 7x weekly  Tandem Stance with Eyes Closed - 2 sets - 10 SECONDS hold - 1x daily - 7x weekly  Tandem Stance with Head Rotation - 10 reps - 2 sets - 1x daily - 7x weekly  Tandem Stance with Head Nods - 10 reps - 2 sets - 1x daily - 7x weekly  Standing Bicep Curls Supinated with Dumbbells - 12 reps - 1 sets - 1x daily - 5x weekly  Standing Triceps Extension - 12 reps - 1 sets - 1x daily - 5x weekly  Shoulder Overhead Press in Flexion with Dumbbells - 12 reps - 1 sets - 1x daily - 5x weekly  Standing Shoulder Abduction with Bent Elbow with Dumbbell - 12 reps - 1 sets - 1x daily - 5x weekly  Standing Single Arm Scapular Protraction with Dumbbell - 10 reps - 1 sets - 1x daily - 5x weekly  Shoulder External Rotation - 10 reps - 1 sets - 1x daily - 5x weekly  Standing Repeated Hip Abduction with Resistance - 10 reps - 2 sets - 1x daily - 5x weekly  Hip and Knee Flexion with Anchored Resistance - 10 reps - 2 sets - 1x daily - 5x weekly  Standing Repeated Hip Adduction with Resistance - 10 reps - 2 sets - 1x daily - 5x weekly  Standing Repeated Hip Extension with Resistance - 10 reps - 3 sets - 1x daily - 5x weekly

## 2018-08-23 ENCOUNTER — Ambulatory Visit: Payer: Medicare Other | Admitting: Physical Therapy

## 2018-08-30 ENCOUNTER — Ambulatory Visit: Payer: Medicare Other | Admitting: Physical Therapy

## 2018-08-30 DIAGNOSIS — I110 Hypertensive heart disease with heart failure: Secondary | ICD-10-CM | POA: Insufficient documentation

## 2018-09-06 ENCOUNTER — Encounter: Payer: Self-pay | Admitting: Physical Therapy

## 2018-09-06 ENCOUNTER — Ambulatory Visit: Payer: Medicare Other | Attending: Physical Medicine & Rehabilitation | Admitting: Physical Therapy

## 2018-09-06 DIAGNOSIS — M6281 Muscle weakness (generalized): Secondary | ICD-10-CM

## 2018-09-06 DIAGNOSIS — R2681 Unsteadiness on feet: Secondary | ICD-10-CM | POA: Diagnosis present

## 2018-09-06 DIAGNOSIS — R269 Unspecified abnormalities of gait and mobility: Secondary | ICD-10-CM | POA: Diagnosis present

## 2018-09-06 DIAGNOSIS — R293 Abnormal posture: Secondary | ICD-10-CM

## 2018-09-06 NOTE — Therapy (Signed)
[facility] [address] [place], [place], [address] Phone: [phone]   Fax:  [phone]  Physical Therapy Treatment  Patient Details  Name: [name] MRN: [id number] Date of Birth: 01/22/1941 Referring Provider (PT): Dr. Alysia Penna   Encounter Date: 09/06/2018  PT End of Session - 09/06/18 1246    Visit Number  15    Number of Visits  21    Date for PT Re-Evaluation  10/02/18    Authorization Type  [employer] Medicare-* requires 10th visit PN    PT Start Time  1150    PT Stop Time  1230    PT Time Calculation (min)  40 min    Equipment Utilized During Treatment  Gait belt    Activity Tolerance  Patient tolerated treatment well    Behavior During Therapy  [facility] for tasks assessed/performed       Past Medical History:  Diagnosis Date  . Anxiety   . Back ache   . Breast cancer ([place])    right  . Bronchitis   . Cancer of colon ([place])   . Depression   . DM (diabetes mellitus) ([place])   . Dyspnea on exertion   . Fall on steps    age 89 feel down iron stairs couldnot move leg for 6 mths  . Family history of primary TB   . Fracture of ankle, closed 2014   left  . History of chicken pox   . History of hysterectomy 04/1980  . History of primary TB   . Hyperlipidemia   . Hypertension   . Kidney disease, chronic, stage III (moderate, EGFR 30-59 ml/min) ([place]) 2017  . Kidney disorder    reduction function  . Kidney stones   . Knee pain, right   . Lung abnormality    age 64 patient had pna spot on lungs   . Microalbuminuria   . Muscle cramps    both legs  . Muscular degeneration    of right eye  . Nephrolithiasis   . Palpitations   . Personal history of radiation therapy   . Stroke [facility])     Past Surgical History:  Procedure Laterality Date  . ABLATION  2007   fast heart rate  . ANKLE FRACTURE SURGERY Left 2014  . BREAST LUMPECTOMY Right   . CATARACT EXTRACTION Right 2017  . colonscopy  2015  .  HYSTEROTOMY     45 years ago  . TONSILLECTOMY AND ADENOIDECTOMY     age 60    There were no vitals filed for this visit.  Subjective Assessment - 09/06/18 1239    Subjective  Pt reports being sick last week.  Has not been doing exercises consistently.    Pertinent History  CVA, Small vessel disease, DM type 2 in nonobese, Stage 60 CKD, HLD, hx of R Breast cancer (TAKE BP ON L SIDE), Colon cancer, TIA, Dyspnea on exertion, Essential HTN, Sinus Tachycardia, Anxiety and depression    Limitations  Lifting;Standing;Walking    How long can you walk comfortably?  Reports she stands to cook, perform laundry, take out garbage and is currently only ambulating around house. She performs walking around neighborhood with her son for 20 min durations with cane.     Patient Stated Goals  Reports goals of driving her car again, walking more with her cane, and return to PLOF.     Currently in Pain?  No/denies          [facility]  Adult PT Treatment/Exercise - 09/06/18 0001      Posture/Postural Control   Posture/Postural Control  Postural limitations    Postural Limitations  Forward head;Rounded Shoulders;Increased thoracic kyphosis;Flexed trunk;Weight shift right      Knee/Hip Exercises: Aerobic   Tread Mill  Treadmill for endurance x 2 minutes x 2 reps at .4-.5 mph.  Pt reports history of a fall while using treadmill 2 years ago so was very fearful to try even with assistance therefore decreased mph today.    Nustep  Scifit level 1.5 all 4 extremities x 5 minutes      Knee/Hip Exercises: Standing   Hip Flexion  Stengthening;Right;Left;1 set;10 reps;Knee bent;Other (comment)   green theraband   Hip ADduction  Strengthening;Right;Left;1 set;10 reps    Hip ADduction Limitations  green theraband resistance    Hip Abduction  Stengthening;Right;Left;1 set;10 reps    Abduction Limitations  green theraband resistance    Hip Extension  Stengthening;Right;Left;1 set;10 reps;Knee straight    Extension  Limitations  green theraband resistance    Other Standing Knee Exercises  Provided theraband for HEP and discussed how to have her son anchor band to table leg and have chair on other side for support while performing exercises.             PT Education - 09/06/18 1245    Education Details  HEP, how to anchor band, being seated to place band around ankle    Person(s) Educated  Patient    Methods  Explanation;Demonstration;Handout   handout provided previous session   Comprehension  Verbalized understanding       PT Short Term Goals - 08/03/18 1156      PT SHORT TERM GOAL #1   Title  Pt will demonstrate independence with strengthening UE and LE HEP with hand weights, ankle weights or theraband    Time  4    Period  Weeks    Status  New    Target Date  09/02/18      PT SHORT TERM GOAL #2   Title  5x sit to stand will decrease to </= 15 seconds without UE support    Baseline  17 seconds    Time  4    Period  Weeks    Status  Revised    Target Date  09/02/18      PT SHORT TERM GOAL #3   Title  Will improve gait velocity to >/= 3.2 ft/sec without cane    Baseline  3 ft/sec    Time  4    Period  Weeks    Status  Revised    Target Date  09/02/18      PT SHORT TERM GOAL #4   Title  Pt will improve her Berg score to >/= 51/56 indicating reduction in fall risk potential     Baseline  49/56    Time  4    Period  Weeks    Status  New    Target Date  09/02/18      PT SHORT TERM GOAL #5   Title  Will improve FGA to >/= 23/30 without use of cane    Baseline  21/30    Time  4    Period  Weeks    Status  New    Target Date  09/02/18        PT Long Term Goals - 08/03/18 1159      PT LONG TERM GOAL #1   Title  Will demonstrate independence with walking program, balance and strengthening HEP    Time  8    Period  Weeks    Target Date  10/02/18      PT LONG TERM GOAL #2   Title  Pt will improve 5xSTS to </=13 seconds from standard height chair with no UE assist to  reduce fall risk potential and demonstrate improvement in LE strength     Time  8    Period  Weeks    Status  Revised    Target Date  10/02/18      PT LONG TERM GOAL #3   Title  Will improve gait velocity to >/= 3.4 ft/sec without cane    Time  8    Period  Weeks    Status  Revised    Target Date  10/02/18      PT LONG TERM GOAL #4   Title  Pt will improve her Berg score to >/= 53/56 indicating reduction in fall risk potential     Time  8    Period  Weeks    Status  Revised    Target Date  10/02/18      PT LONG TERM GOAL #5   Title  Pt will ambulate 500 feet outdoors over uneven pavement, up/down curb and over obstacles without AD, supervision.  Will negotiate 8 steps utilizing reciprocal stepping pattern without cane or rail with supervision    Time  8    Period  Weeks    Status  Revised    Target Date  10/02/18      PT LONG TERM GOAL #6   Title  Pt will improve FGA score to >25/30 indicating reduction in fall risk potential during dynamic gait    Time  8    Period  Weeks    Status  Revised    Target Date  10/02/18            Plan - 09/06/18 1247    Clinical Impression Statement  Treatment session focused on reviewing 4 way standing HEP with green band and endurance training.  Pt was able to try treadmill despite being fearful from h/o fall.  Continue PT per POC.    Rehab Potential  Good    Clinical Impairments Affecting Rehab Potential  relies on family for transportation. Pt reports she has high anxiety when ambulating in clinic and is taking medication for this.     PT Frequency  Other (comment)   2x/week the first week + 1x/week for 7 weeks   PT Duration  8 weeks    PT Treatment/Interventions  ADLs/Self Care Home Management;Balance training;Neuromuscular re-education;DME Instruction;Gait training;Patient/family education;Stair training;Electrical Stimulation;Functional mobility training;Therapeutic activities;Therapeutic exercise;Passive range of motion;Aquatic  Therapy    PT Next Visit Plan   TAKE BP ON L SIDE 2/2 TO HX OF R BREAST CANCER**  Follow up on how 4 way hip exercises have been at home.  Can also do aerobic on Nustep/Scifit.  Treadmill uphill/downhill.  Stairs without UE support.  stepping over obstacles; gait without AD    PT Home Exercise Plan  [phone]     Consulted and Agree with Plan of Care  Patient       Patient will benefit from skilled therapeutic intervention in order to improve the following deficits and impairments:  Abnormal gait, Decreased strength, Decreased balance, Decreased mobility, Postural dysfunction, Decreased activity tolerance, Decreased endurance, Difficulty walking  Visit Diagnosis: Muscle weakness (generalized)  Unsteadiness on feet  Abnormality of gait and mobility  Abnormal posture     Problem List Patient Active Problem List   Diagnosis Date Noted  . 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 Labile blood pressure   . Elevated blood pressure reading   . CKD (chronic kidney disease), stage III ([place])   . Diabetes mellitus type 2 in obese ([place])   . Small vessel disease ([place]) 03/28/2018  . Diabetes mellitus type 2 in nonobese ([facility])   . Anxiety state   . Stage 3 chronic kidney disease ([place])   . Hyperlipidemia   . History of breast cancer   . Acute ischemic stroke ([place])   . TIA (transient ischemic attack) 03/24/2018  . Dyspnea on exertion 05/30/2017  . Sinus tachycardia 05/30/2017  . Mixed hyperlipidemia 05/30/2017  . Essential hypertension 05/30/2017    Narda Bonds, PTA [facility] 09/06/18 12:58 PM Phone: [phone] Fax: [facility] [address] [place], [place], [address] Phone: [phone]   Fax:  [phone]  Name: [name] MRN: [id number] Date of Birth: 1940/08/15

## 2018-09-13 ENCOUNTER — Encounter: Payer: Self-pay | Admitting: Physical Therapy

## 2018-09-13 ENCOUNTER — Ambulatory Visit: Payer: Medicare Other | Admitting: Physical Therapy

## 2018-09-13 DIAGNOSIS — M6281 Muscle weakness (generalized): Secondary | ICD-10-CM

## 2018-09-13 DIAGNOSIS — R269 Unspecified abnormalities of gait and mobility: Secondary | ICD-10-CM

## 2018-09-13 DIAGNOSIS — R293 Abnormal posture: Secondary | ICD-10-CM

## 2018-09-13 DIAGNOSIS — R2681 Unsteadiness on feet: Secondary | ICD-10-CM

## 2018-09-13 NOTE — Therapy (Signed)
[facility] [address] [place], [place], [address] Phone: [phone]   Fax:  [phone]  Physical Therapy Treatment  Patient Details  Name: [name] MRN: [id number] Date of Birth: October 27, 1940 Referring Provider (PT): Dr. Alysia Penna   Encounter Date: 09/13/2018  PT End of Session - 09/13/18 1234    Visit Number  16    Number of Visits  21    Date for PT Re-Evaluation  10/02/18    Authorization Type  [employer] Medicare-* requires 10th visit PN    PT Start Time  1147    PT Stop Time  1230    PT Time Calculation (min)  43 min    Activity Tolerance  Patient tolerated treatment well    Behavior During Therapy  [facility] for tasks assessed/performed       Past Medical History:  Diagnosis Date  . Anxiety   . Back ache   . Breast cancer ([place])    right  . Bronchitis   . Cancer of colon ([place])   . Depression   . DM (diabetes mellitus) ([place])   . Dyspnea on exertion   . Fall on steps    age 89 feel down iron stairs couldnot move leg for 6 mths  . Family history of primary TB   . Fracture of ankle, closed 2014   left  . History of chicken pox   . History of hysterectomy 04/1980  . History of primary TB   . Hyperlipidemia   . Hypertension   . Kidney disease, chronic, stage III (moderate, EGFR 30-59 ml/min) ([place]) 2017  . Kidney disorder    reduction function  . Kidney stones   . Knee pain, right   . Lung abnormality    age 69 patient had pna spot on lungs   . Microalbuminuria   . Muscle cramps    both legs  . Muscular degeneration    of right eye  . Nephrolithiasis   . Palpitations   . Personal history of radiation therapy   . Stroke [facility])     Past Surgical History:  Procedure Laterality Date  . ABLATION  2007   fast heart rate  . ANKLE FRACTURE SURGERY Left 2014  . BREAST LUMPECTOMY Right   . CATARACT EXTRACTION Right 2017  . colonscopy  2015  . HYSTEROTOMY     45 years ago  . TONSILLECTOMY AND  ADENOIDECTOMY     age 59    There were no vitals filed for this visit.  Subjective Assessment - 09/13/18 1154    Subjective  Is back to doing her exercises but is having increased discomfort in her back.  Wakes up with back pain and it improves after a few hours of being up and moving around.    Pertinent History  CVA, Small vessel disease, DM type 2 in nonobese, Stage 59 CKD, HLD, hx of R Breast cancer (TAKE BP ON L SIDE), Colon cancer, TIA, Dyspnea on exertion, Essential HTN, Sinus Tachycardia, Anxiety and depression    Limitations  Lifting;Standing;Walking    How long can you walk comfortably?  Reports she stands to cook, perform laundry, take out garbage and is currently only ambulating around house. She performs walking around neighborhood with her son for 20 min durations with cane.     Patient Stated Goals  Reports goals of driving her car again, walking more with her cane, and return to PLOF.     Currently in Pain?  No/denies  [facility] PT Assessment - 09/13/18 1158      Ambulation/Gait   Ambulation/Gait  Yes    Ambulation/Gait Assistance  5: Supervision    Ambulation/Gait Assistance Details  outside over pavement without cane with decreased veering and foot drag today    Ambulation Distance (Feet)  500 Feet    Assistive device  None    Gait Pattern  Step-through pattern    Ambulation Surface  Unlevel;Paved;Outdoor    Stairs  Yes    Stairs Assistance  5: Supervision    Stair Management Technique  No rails;Alternating pattern;Forwards    Number of Stairs  8    Height of Stairs  6      Standardized Balance Assessment   Standardized Balance Assessment  Berg Balance Test;Five Times Sit to Stand;10 meter walk test    Five times sit to stand comments   13 seconds from mat without use of UE    10 Meter Walk  11.35 seconds without cane or 2.88 ft/sec      Berg Balance Test   Sit to Stand  Able to stand without using hands and stabilize independently    Standing Unsupported   Able to stand safely 2 minutes    Sitting with Back Unsupported but Feet Supported on Floor or Stool  Able to sit safely and securely 2 minutes    Stand to Sit  Sits safely with minimal use of hands    Transfers  Able to transfer safely, minor use of hands    Standing Unsupported with Eyes Closed  Able to stand 10 seconds safely    Standing Ubsupported with Feet Together  Able to place feet together independently and stand 1 minute safely    From Standing, Reach Forward with Outstretched Arm  Can reach confidently >25 cm (10")    From Standing Position, Pick up Object from Floor  Able to pick up shoe safely and easily    From Standing Position, Turn to Look Behind Over each Shoulder  Looks behind from both sides and weight shifts well    Turn 360 Degrees  Able to turn 360 degrees safely in 4 seconds or less    Standing Unsupported, Alternately Place Feet on Step/Stool  Able to complete 4 steps without aid or supervision    Standing Unsupported, One Foot in Front  Able to plae foot ahead of the other independently and hold 30 seconds    Standing on One Leg  Able to lift leg independently and hold equal to or more than 3 seconds    Total Score  51    Berg comment:  51/56      Functional Gait  Assessment   Gait assessed   Yes    Gait Level Surface  Walks 20 ft, slow speed, abnormal gait pattern, evidence for imbalance or deviates 10-15 in outside of the 12 in walkway width. Requires more than 7 sec to ambulate 20 ft.    Change in Gait Speed  Able to smoothly change walking speed without loss of balance or gait deviation. Deviate no more than 6 in outside of the 12 in walkway width.    Gait with Horizontal Head Turns  Performs head turns smoothly with no change in gait. Deviates no more than 6 in outside 12 in walkway width    Gait with Vertical Head Turns  Performs head turns with no change in gait. Deviates no more than 6 in outside 12 in walkway width.    Gait and  Pivot Turn  Pivot turns safely  within 3 sec and stops quickly with no loss of balance.    Step Over Obstacle  Is able to step over 2 stacked shoe boxes taped together (9 in total height) without changing gait speed. No evidence of imbalance.    Gait with Narrow Base of Support  Ambulates less than 4 steps heel to toe or cannot perform without assistance.    Gait with Eyes Closed  Walks 20 ft, uses assistive device, slower speed, mild gait deviations, deviates 6-10 in outside 12 in walkway width. Ambulates 20 ft in less than 9 sec but greater than 7 sec.    Ambulating Backwards  Walks 20 ft, uses assistive device, slower speed, mild gait deviations, deviates 6-10 in outside 12 in walkway width.    Steps  Alternating feet, no rail.    Total Score  23    FGA comment:  23/30 medium falls risk                           PT Education - 09/13/18 1233    Education Details  progress towards goals and plan to D/C at next visit    Person(s) Educated  Patient    Methods  Explanation    Comprehension  Verbalized understanding       PT Short Term Goals - 08/03/18 1156      PT SHORT TERM GOAL #1   Title  Pt will demonstrate independence with strengthening UE and LE HEP with hand weights, ankle weights or theraband    Time  4    Period  Weeks    Status  New    Target Date  09/02/18      PT SHORT TERM GOAL #2   Title  5x sit to stand will decrease to </= 15 seconds without UE support    Baseline  17 seconds    Time  4    Period  Weeks    Status  Revised    Target Date  09/02/18      PT SHORT TERM GOAL #3   Title  Will improve gait velocity to >/= 3.2 ft/sec without cane    Baseline  3 ft/sec    Time  4    Period  Weeks    Status  Revised    Target Date  09/02/18      PT SHORT TERM GOAL #4   Title  Pt will improve her Berg score to >/= 51/56 indicating reduction in fall risk potential     Baseline  49/56    Time  4    Period  Weeks    Status  New    Target Date  09/02/18      PT SHORT TERM GOAL  #5   Title  Will improve FGA to >/= 23/30 without use of cane    Baseline  21/30    Time  4    Period  Weeks    Status  New    Target Date  09/02/18        PT Long Term Goals - 09/13/18 1157      PT LONG TERM GOAL #1   Title  Will demonstrate independence with walking program, balance and strengthening HEP    Time  8    Period  Weeks    Status  On-going      PT LONG TERM GOAL #2   Title  Pt will improve 5xSTS to </=13 seconds from standard height chair with no UE assist to reduce fall risk potential and demonstrate improvement in LE strength     Baseline  13 seconds    Time  8    Period  Weeks    Status  Achieved      PT LONG TERM GOAL #3   Title  Will improve gait velocity to >/= 3.4 ft/sec without cane    Baseline  2.8 ft/sec    Time  8    Period  Weeks    Status  Not Met      PT LONG TERM GOAL #4   Title  Pt will improve her Berg score to >/= 53/56 indicating reduction in fall risk potential     Baseline  51/56    Time  8    Period  Weeks    Status  Partially Met      PT LONG TERM GOAL #5   Title  Pt will ambulate 500 feet outdoors over uneven pavement, up/down curb and over obstacles without AD, supervision.  Will negotiate 8 steps utilizing reciprocal stepping pattern without cane or rail with supervision    Baseline  --    Time  8    Period  Weeks    Status  Achieved      PT LONG TERM GOAL #6   Title  Pt will improve FGA score to >25/30 indicating reduction in fall risk potential during dynamic gait    Baseline  23/30     Time  8    Period  Weeks    Status  Partially Met            Plan - 09/13/18 1234    Clinical Impression Statement  Due to progress, pt will likely not need 2 more visits to finish out certification period.  Initiated re-assessment of outcome measures and progress towards LTG.  Pt has made steady progress and has met 2 LTG, partially met 2 LTG, did not meet gait velocity goal (velocity remained the same as previous assessment) and  HEP goal is ongoing and will be address at next visit.  Pt agreeable to D/C at next visit    Rehab Potential  Good    Clinical Impairments Affecting Rehab Potential  relies on family for transportation. Pt reports she has high anxiety when ambulating in clinic and is taking medication for this.     PT Frequency  Other (comment)   2x/week the first week + 1x/week for 7 weeks   PT Duration  8 weeks    PT Treatment/Interventions  ADLs/Self Care Home Management;Balance training;Neuromuscular re-education;DME Instruction;Gait training;Patient/family education;Stair training;Electrical Stimulation;Functional mobility training;Therapeutic activities;Therapeutic exercise;Passive range of motion;Aquatic Therapy    PT Next Visit Plan  finalize HEP and D/C    PT Home Exercise Plan  [phone]     Consulted and Agree with Plan of Care  Patient       Patient will benefit from skilled therapeutic intervention in order to improve the following deficits and impairments:  Abnormal gait, Decreased strength, Decreased balance, Decreased mobility, Postural dysfunction, Decreased activity tolerance, Decreased endurance, Difficulty walking  Visit Diagnosis: Muscle weakness (generalized)  Unsteadiness on feet  Abnormality of gait and mobility  Abnormal posture     Problem List Patient Active Problem List   Diagnosis Date Noted  . 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 Labile blood pressure   . Elevated blood pressure reading   . CKD (chronic  kidney disease), stage III ([place])   . Diabetes mellitus type 2 in obese ([place])   . Small vessel disease ([place]) 03/28/2018  . Diabetes mellitus type 2 in nonobese ([facility])   . Anxiety state   . Stage 3 chronic kidney disease ([place])   . Hyperlipidemia   . History of breast cancer   . Acute ischemic stroke ([place])   . TIA (transient ischemic attack) 03/24/2018  . Dyspnea on exertion 05/30/2017  . Sinus tachycardia 05/30/2017  . Mixed hyperlipidemia 05/30/2017  .  Essential hypertension 05/30/2017    Rico Junker, PT, DPT 09/13/18    12:38 PM    [facility] [address] [place], [place], [address] Phone: [phone]   Fax:  [phone]  Name: Sayla Golonka MRN: [id number] Date of Birth: 1940/08/16

## 2018-09-15 ENCOUNTER — Other Ambulatory Visit: Payer: Self-pay

## 2018-09-19 ENCOUNTER — Ambulatory Visit: Payer: Medicare Other | Admitting: Internal Medicine

## 2018-09-19 VITALS — BP 100/80 | HR 104 | Ht 67.0 in | Wt 183.6 lb

## 2018-09-19 DIAGNOSIS — E118 Type 2 diabetes mellitus with unspecified complications: Secondary | ICD-10-CM

## 2018-09-19 DIAGNOSIS — E782 Mixed hyperlipidemia: Secondary | ICD-10-CM

## 2018-09-19 DIAGNOSIS — I10 Essential (primary) hypertension: Secondary | ICD-10-CM

## 2018-09-19 DIAGNOSIS — R0609 Other forms of dyspnea: Secondary | ICD-10-CM

## 2018-09-19 DIAGNOSIS — R0600 Dyspnea, unspecified: Secondary | ICD-10-CM

## 2018-09-19 DIAGNOSIS — R000 Tachycardia, unspecified: Secondary | ICD-10-CM

## 2018-09-19 NOTE — Patient Instructions (Signed)
Medication Instructions:  Dr.Acharya recommends that you continue on your current medications as directed. Please refer to the Current Medication list given to you today.  If you need a refill on your cardiac medications before your next appointment, please call your pharmacy.   Lab work: None ordered  Testing/Procedures: None ordered  Follow-Up: At [facility], you and your health needs are our priority.  As part of our continuing mission to provide you with exceptional heart care, we have created designated Provider Care Teams.  These Care Teams include your primary Cardiologist (physician) and Advanced Practice Providers (APPs -  Physician Assistants and Nurse Practitioners) who all work together to provide you with the care you need, when you need it. You will need a follow up appointment in 6 months.  Please call our office 2 months in advance to schedule this appointment.  You may see  Dr.Acharya or one of the following Advanced Practice Providers on your designated Care Team:   Makayla Ferries, PA-C . Makayla Sims, DNP, ANP  Any Other Special Instructions Will Be Listed Below (If Applicable). Exercise recommendations: Goal of exercising for at least 30 minutes a day, at least 5 times per week.  Please exercise to a moderate exertion.  This means that while exercising it is difficult to speak in full sentences, however you are not so short of breath that you feel you must stop, and not so comfortable that you can carry on a full conversation.  Exertion level should be approximately a 5/10, if 10 is the most exertion you can perform.  Diet recommendations: Recommend a heart healthy diet such as the Mediterranean diet.  This diet consists of plant based foods, healthy fats, lean meats, olive oil.  It suggests limiting the intake of simple carbohydrates such as white breads, pastries, and pastas.  It also limits the amount of red meat, wine, and dairy products such as cheese that one  should consume on a daily basis.     Mediterranean Diet A Mediterranean diet refers to food and lifestyle choices that are based on the traditions of countries located on the [employer]. This way of eating has been shown to help prevent certain conditions and improve outcomes for people who have chronic diseases, like kidney disease and heart disease. What are tips for following this plan? Lifestyle  Cook and eat meals together with your family, when possible.  Drink enough fluid to keep your urine clear or pale yellow.  Be physically active every day. This includes: ? Aerobic exercise like running or swimming. ? Leisure activities like gardening, walking, or housework.  Get 7-8 hours of sleep each night.  If recommended by your health care provider, drink red wine in moderation. This means 1 glass a day for nonpregnant women and 2 glasses a day for men. A glass of wine equals 5 oz (150 mL). Reading food labels   Check the serving size of packaged foods. For foods such as rice and pasta, the serving size refers to the amount of cooked product, not dry.  Check the total fat in packaged foods. Avoid foods that have saturated fat or trans fats.  Check the ingredients list for added sugars, such as corn syrup. Shopping  At the grocery store, buy most of your food from the areas near the walls of the store. This includes: ? Fresh fruits and vegetables (produce). ? Grains, beans, nuts, and seeds. Some of these may be available in unpackaged forms or large amounts (in bulk). ?  Fresh seafood. ? Poultry and eggs. ? Low-fat dairy products.  Buy whole ingredients instead of prepackaged foods.  Buy fresh fruits and vegetables in-season from local farmers markets.  Buy frozen fruits and vegetables in resealable bags.  If you do not have access to quality fresh seafood, buy precooked frozen shrimp or canned fish, such as tuna, salmon, or sardines.  Buy small amounts of raw or  cooked vegetables, salads, or olives from the deli or salad bar at your store.  Stock your pantry so you always have certain foods on hand, such as olive oil, canned tuna, canned tomatoes, rice, pasta, and beans. Cooking  Cook foods with extra-virgin olive oil instead of using butter or other vegetable oils.  Have meat as a side dish, and have vegetables or grains as your main dish. This means having meat in small portions or adding small amounts of meat to foods like pasta or stew.  Use beans or vegetables instead of meat in common dishes like chili or lasagna.  Experiment with different cooking methods. Try roasting or broiling vegetables instead of steaming or sauteing them.  Add frozen vegetables to soups, stews, pasta, or rice.  Add nuts or seeds for added healthy fat at each meal. You can add these to yogurt, salads, or vegetable dishes.  Marinate fish or vegetables using olive oil, lemon juice, garlic, and fresh herbs. Meal planning   Plan to eat 1 vegetarian meal one day each week. Try to work up to 2 vegetarian meals, if possible.  Eat seafood 2 or more times a week.  Have healthy snacks readily available, such as: ? Vegetable sticks with hummus. ? [place] yogurt. ? Fruit and nut trail mix.  Eat balanced meals throughout the week. This includes: ? Fruit: 2-3 servings a day ? Vegetables: 4-5 servings a day ? Low-fat dairy: 2 servings a day ? Fish, poultry, or lean meat: 1 serving a day ? Beans and legumes: 2 or more servings a week ? Nuts and seeds: 1-2 servings a day ? Whole grains: 6-8 servings a day ? Extra-virgin olive oil: 3-4 servings a day  Limit red meat and sweets to only a few servings a month What are my food choices?  Mediterranean diet ? Recommended ? Grains: Whole-grain pasta. Brown rice. Bulgar wheat. Polenta. Couscous. Whole-wheat bread. [place]. ? Vegetables: Artichokes. Beets. Broccoli. Cabbage. Carrots. Eggplant. Green beans. Chard.  Kale. Spinach. Onions. Leeks. Peas. Squash. Tomatoes. Peppers. Radishes. ? Fruits: Apples. Apricots. Avocado. Berries. Bananas. Cherries. Dates. Figs. Grapes. Lemons. Melon. Oranges. Peaches. Plums. Pomegranate. ? Meats and other protein foods: Beans. Almonds. Sunflower seeds. Pine nuts. Peanuts. [place]. Salmon. Scallops. Shrimp. [place]. Tilapia. Clams. Oysters. Eggs. ? Dairy: Low-fat milk. Cheese. Greek yogurt. ? Beverages: Water. Red wine. Herbal tea. ? Fats and oils: Extra virgin olive oil. Avocado oil. Grape seed oil. ? Sweets and desserts: [place] yogurt with honey. Baked apples. Poached pears. Trail mix. ? Seasoning and other foods: Basil. Cilantro. Coriander. Cumin. Mint. Parsley. Sage. Rosemary. Tarragon. Garlic. Oregano. Thyme. Pepper. Balsalmic vinegar. Tahini. Hummus. Tomato sauce. Olives. Mushrooms. ? Limit these ? Grains: Prepackaged pasta or rice dishes. Prepackaged cereal with added sugar. ? Vegetables: Deep fried potatoes (french fries). ? Fruits: Fruit canned in syrup. ? Meats and other protein foods: Beef. Pork. Lamb. Poultry with skin. Hot dogs. [place] Salines. ? Dairy: Ice cream. Sour cream. Whole milk. ? Beverages: Juice. Sugar-sweetened soft drinks. Beer. Liquor and spirits. ? Fats and oils: Butter. Canola oil. Vegetable oil. Beef fat (tallow).  Lard. ? Sweets and desserts: Cookies. Cakes. Pies. Candy. ? Seasoning and other foods: Mayonnaise. Premade sauces and marinades. ? The items listed may not be a complete list. Talk with your dietitian about what dietary choices are right for you. Summary  The Mediterranean diet includes both food and lifestyle choices.  Eat a variety of fresh fruits and vegetables, beans, nuts, seeds, and whole grains.  Limit the amount of red meat and sweets that you eat.  Talk with your health care provider about whether it is safe for you to drink red wine in moderation. This means 1 glass a day for nonpregnant women and 2 glasses a day for men. A glass of  wine equals 5 oz (150 mL). This information is not intended to replace advice given to you by your health care provider. Make sure you discuss any questions you have with your health care provider. Document Released: 03/04/2016 Document Revised: 04/06/2016 Document Reviewed: 03/04/2016 Elsevier Interactive Patient Education  2019 [employer].

## 2018-09-19 NOTE — Progress Notes (Signed)
Cardiology Office Note:    Date:  09/19/2018   ID:  [name], DOB [birth year], MRN [id number]  PCP:  Crist Infante, MD  Cardiologist:  Nelva Bush, MD  Electrophysiologist:  None   Referring MD: Crist Infante, MD   Cardiovascular follow up  History of Present Illness:    [name] is a 78 y.o. female with a hx of tachycardia status post ablation in 2007, hypertension, hyperlipidemia, type 2 diabetes mellitus, chronic kidney disease stage III,breast cancer status post right lumpectomy and radiation, and colon cancer presents for follow up.   TTE (06/14/17): Normal LV size. LVEF 65-70% with normal wall motion. Grade 1 diastolic dysfunction. Trileaflet aortic valve with mild thickening. No stenosis or regurgitation. Trivial MR, TR, and PR. Normal RV size and function. Normal PA pressure.  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 Echocardiogram with ejection fraction of 40%, grade I diastolic dysfunction. Carotid Dopplers negative for ICA stenosis. Patient was started on dual antiplatelet therapy aspirin and Plavix x 3 months. Currently on Plavix only.   Last follow up in cardiology was 05/23/2018. She has been overall well since that time. We discussed best ways to maintain good diet and lifestyle for optimal cardiovascular health. She endorses mild shortness of breath on exertion, and has recently had a negative ischemic and structural workup.   The patient denies chest pain, chest pressure, dyspnea at rest, palpitations, PND, orthopnea, or leg swelling. Denies syncope or presyncope. Denies dizziness or lightheadedness.  Past Medical History:  Diagnosis Date  . Anxiety   . Back ache   . Breast cancer ([place])    right  . Bronchitis   . Cancer of colon ([place])   . Depression   . DM (diabetes mellitus) ([place])   . Dyspnea on exertion   . Fall on steps    age 36 feel down iron stairs couldnot move leg for 6 mths  . Family history of primary TB   . Fracture of ankle, closed 2014     left  . History of chicken pox   . History of hysterectomy 04/1980  . History of primary TB   . Hyperlipidemia   . Hypertension   . Kidney disease, chronic, stage III (moderate, EGFR 30-59 ml/min) ([place]) 2017  . Kidney disorder    reduction function  . Kidney stones   . Knee pain, right   . Lung abnormality    age 58 patient had pna spot on lungs   . Microalbuminuria   . Muscle cramps    both legs  . Muscular degeneration    of right eye  . Nephrolithiasis   . Palpitations   . Personal history of radiation therapy   . Stroke [facility])     Past Surgical History:  Procedure Laterality Date  . ABLATION  2007   fast heart rate  . ANKLE FRACTURE SURGERY Left 2014  . BREAST LUMPECTOMY Right   . CATARACT EXTRACTION Right 2017  . colonscopy  2015  . HYSTEROTOMY     45 years ago  . TONSILLECTOMY AND ADENOIDECTOMY     age 41    Current Medications: Current Meds  Medication Sig  . acetaminophen (TYLENOL) 325 MG tablet Take 2 tablets (650 mg total) by mouth every 4 (four) hours as needed for mild pain (or temp > 37.5 C (99.5 F)).  Marland Kitchen ARIPiprazole (ABILIFY) 5 MG tablet Take 1 tablet (5 mg total) by mouth daily.  Marland Kitchen atorvastatin (LIPITOR) 80 MG tablet Take 1 tablet (80 mg total)  by mouth daily at 6 PM.  . clopidogrel (PLAVIX) 75 MG tablet Take 1 tablet (75 mg total) by mouth daily.  Marland Kitchen docusate sodium (COLACE) 100 MG capsule Take 100 mg by mouth 2 (two) times daily.  . Insulin Lispro Prot & Lispro (HUMALOG MIX 75/25 [place]) Inject into the skin. INJECT 20U WITH BREAKFAST AND SUPPER. INCREASE AS DIRCTED IN CLINIC.  Marland Kitchen lisinopril (PRINIVIL,ZESTRIL) 10 MG tablet Take 10 mg by mouth daily.  Marland Kitchen LORazepam (ATIVAN) 0.5 MG tablet Take 1 tablet (0.5 mg total) by mouth 4 (four) times daily.  . mirtazapine (REMERON) 15 MG tablet Take 1 tablet (15 mg total) by mouth at bedtime.  . Multiple Vitamin (MULTIVITAMIN) capsule Take 1 capsule by mouth daily.  . multivitamin-lutein (OCUVITE-LUTEIN) CAPS capsule  Take 2 capsules by mouth daily.  Marland Kitchen omega-3 acid ethyl esters (LOVAZA) 1 g capsule Take 1 capsule (1 g total) by mouth 2 (two) times daily.  Glory Rosebush VERIO test strip USE 1 STRIP TO TEST BLOOD GLUCOSE TWICE DAILY DIAG CODE E11.9  . sennosides-docusate sodium (SENOKOT-S) 8.6-50 MG tablet Take 1 tablet by mouth daily.  Marland Kitchen venlafaxine XR (EFFEXOR-XR) 150 MG 24 hr capsule Take 1 capsule by mouth daily.     Allergies:   Patient has no known allergies.   Social History   Socioeconomic History  . Marital status: Divorced    Spouse name: Not on file  . Number of children: 2  . Years of education: Not on file  . Highest education level: Not on file  Occupational History  . Not on file  Social Needs  . Financial resource strain: Not on file  . Food insecurity:    Worry: Not on file    Inability: Not on file  . Transportation needs:    Medical: Not on file    Non-medical: Not on file  Tobacco Use  . Smoking status: Never Smoker  . Smokeless tobacco: Never Used  Substance and Sexual Activity  . Alcohol use: No  . Drug use: No  . Sexual activity: Not Currently  Lifestyle  . Physical activity:    Days per week: Not on file    Minutes per session: Not on file  . Stress: Not on file  Relationships  . Social connections:    Talks on phone: Not on file    Gets together: Not on file    Attends religious service: Not on file    Active member of club or organization: Not on file    Attends meetings of clubs or organizations: Not on file    Relationship status: Not on file  Other Topics Concern  . Not on file  Social History Narrative  . Not on file     Family History: The patient's family history includes Asthma (age of onset: 93) in her brother; Cancer in her father; Kidney Stones in her brother; Peripheral Artery Disease (age of onset: 34) in her mother. There is no history of Breast cancer.  ROS:   Please see the history of present illness.    All other systems reviewed and  are negative.  EKGs/Labs/Other Studies Reviewed:    The following studies were reviewed today:  EKG:  Sinus tachycardia, rate 104 bpm, left axis deviation.  Recent Labs: 03/29/2018: ALT 46; Hemoglobin 11.9; Platelets 207 04/10/2018: BUN 37; Creatinine, Ser 1.97; Potassium 4.5; Sodium 139  Recent Lipid Panel    Component Value Date/Time   CHOL 189 03/25/2018 0247   TRIG 578 (H) 03/25/2018  0247   HDL 25 (L) 03/25/2018 0247   CHOLHDL 7.6 03/25/2018 0247   VLDL UNABLE TO CALCULATE IF TRIGLYCERIDE OVER 400 mg/dL 03/25/2018 0247   LDLCALC UNABLE TO CALCULATE IF TRIGLYCERIDE OVER 400 mg/dL 03/25/2018 0247    Physical Exam:    VS:  BP 100/80   Pulse (!) 104   Ht '5\' 7"'$  (1.702 m)   Wt 183 lb 9.6 oz (83.3 kg)   BMI 28.76 kg/m     Wt Readings from Last 3 Encounters:  09/19/18 183 lb 9.6 oz (83.3 kg)  08/14/18 185 lb 9.6 oz (84.2 kg)  07/03/18 184 lb (83.5 kg)     Constitutional: No acute distress Eyes:  sclera non-icteric, normal conjunctiva and lids ENMT: normal dentition, moist mucous membranes Cardiovascular: regular rhythm, tachycardic, no murmurs. S1 and S2 normal. Radial pulses normal bilaterally. No jugular venous distention.  Respiratory: clear to auscultation bilaterally GI : normal bowel sounds, soft and nontender. No distention.   MSK: extremities warm, well perfused. No edema.  NEURO: grossly nonfocal exam, moves all extremities. PSYCH: alert and oriented x 3, normal mood and affect.   ASSESSMENT:    1. Sinus tachycardia   2. Essential hypertension   3. Mixed hyperlipidemia   4. Type 2 diabetes mellitus with complication, without long-term current use of insulin ([place])   5. Dyspnea on exertion    PLAN:    We discussed healthy diet and lifestyle modification for cardiovascular health and hypertension/hyperlipidemia.  Sinus tachycardia is stable, no medication changes needed.   HLD - continue atorvastatin 80 mg daily, tolerating well.   HTN - continue  lisinopril, no concerning hypotension.   F/u in 6 mo.    Medication Adjustments/Labs and Tests Ordered: Current medicines are reviewed at length with the patient today.  Concerns regarding medicines are outlined above.  Orders Placed This Encounter  Procedures  . EKG 12-Lead   No orders of the defined types were placed in this encounter.   Patient Instructions  Medication Instructions:  Dr.Joselle Deeds recommends that you continue on your current medications as directed. Please refer to the Current Medication list given to you today.  If you need a refill on your cardiac medications before your next appointment, please call your pharmacy.   Lab work: None ordered  Testing/Procedures: None ordered  Follow-Up: At [facility], you and your health needs are our priority.  As part of our continuing mission to provide you with exceptional heart care, we have created designated Provider Care Teams.  These Care Teams include your primary Cardiologist (physician) and Advanced Practice Providers (APPs -  Physician Assistants and Nurse Practitioners) who all work together to provide you with the care you need, when you need it. You will need a follow up appointment in 6 months.  Please call our office 2 months in advance to schedule this appointment.  You may see  Dr.Adreana Coull or one of the following Advanced Practice Providers on your designated Care Team:   Rosaria Ferries, PA-C . Jory Sims, DNP, ANP  Any Other Special Instructions Will Be Listed Below (If Applicable). Exercise recommendations: Goal of exercising for at least 30 minutes a day, at least 5 times per week.  Please exercise to a moderate exertion.  This means that while exercising it is difficult to speak in full sentences, however you are not so short of breath that you feel you must stop, and not so comfortable that you can carry on a full conversation.  Exertion level should be  approximately a 5/10, if 10 is the most  exertion you can perform.  Diet recommendations: Recommend a heart healthy diet such as the Mediterranean diet.  This diet consists of plant based foods, healthy fats, lean meats, olive oil.  It suggests limiting the intake of simple carbohydrates such as white breads, pastries, and pastas.  It also limits the amount of red meat, wine, and dairy products such as cheese that one should consume on a daily basis.     Mediterranean Diet A Mediterranean diet refers to food and lifestyle choices that are based on the traditions of countries located on the [employer]. This way of eating has been shown to help prevent certain conditions and improve outcomes for people who have chronic diseases, like kidney disease and heart disease. What are tips for following this plan? Lifestyle  Cook and eat meals together with your family, when possible.  Drink enough fluid to keep your urine clear or pale yellow.  Be physically active every day. This includes: ? Aerobic exercise like running or swimming. ? Leisure activities like gardening, walking, or housework.  Get 7-8 hours of sleep each night.  If recommended by your health care provider, drink red wine in moderation. This means 1 glass a day for nonpregnant women and 2 glasses a day for men. A glass of wine equals 5 oz (150 mL). Reading food labels   Check the serving size of packaged foods. For foods such as rice and pasta, the serving size refers to the amount of cooked product, not dry.  Check the total fat in packaged foods. Avoid foods that have saturated fat or trans fats.  Check the ingredients list for added sugars, such as corn syrup. Shopping  At the grocery store, buy most of your food from the areas near the walls of the store. This includes: ? Fresh fruits and vegetables (produce). ? Grains, beans, nuts, and seeds. Some of these may be available in unpackaged forms or large amounts (in bulk). ? Fresh seafood. ? Poultry and  eggs. ? Low-fat dairy products.  Buy whole ingredients instead of prepackaged foods.  Buy fresh fruits and vegetables in-season from local farmers markets.  Buy frozen fruits and vegetables in resealable bags.  If you do not have access to quality fresh seafood, buy precooked frozen shrimp or canned fish, such as tuna, salmon, or sardines.  Buy small amounts of raw or cooked vegetables, salads, or olives from the deli or salad bar at your store.  Stock your pantry so you always have certain foods on hand, such as olive oil, canned tuna, canned tomatoes, rice, pasta, and beans. Cooking  Cook foods with extra-virgin olive oil instead of using butter or other vegetable oils.  Have meat as a side dish, and have vegetables or grains as your main dish. This means having meat in small portions or adding small amounts of meat to foods like pasta or stew.  Use beans or vegetables instead of meat in common dishes like chili or lasagna.  Experiment with different cooking methods. Try roasting or broiling vegetables instead of steaming or sauteing them.  Add frozen vegetables to soups, stews, pasta, or rice.  Add nuts or seeds for added healthy fat at each meal. You can add these to yogurt, salads, or vegetable dishes.  Marinate fish or vegetables using olive oil, lemon juice, garlic, and fresh herbs. Meal planning   Plan to eat 1 vegetarian meal one day each week. Try to work up to  2 vegetarian meals, if possible.  Eat seafood 2 or more times a week.  Have healthy snacks readily available, such as: ? Vegetable sticks with hummus. ? [place] yogurt. ? Fruit and nut trail mix.  Eat balanced meals throughout the week. This includes: ? Fruit: 2-3 servings a day ? Vegetables: 4-5 servings a day ? Low-fat dairy: 2 servings a day ? Fish, poultry, or lean meat: 1 serving a day ? Beans and legumes: 2 or more servings a week ? Nuts and seeds: 1-2 servings a day ? Whole grains: 6-8 servings a  day ? Extra-virgin olive oil: 3-4 servings a day  Limit red meat and sweets to only a few servings a month What are my food choices?  Mediterranean diet ? Recommended ? Grains: Whole-grain pasta. Brown rice. Bulgar wheat. Polenta. Couscous. Whole-wheat bread. [place]. ? Vegetables: Artichokes. Beets. Broccoli. Cabbage. Carrots. Eggplant. Green beans. Chard. Kale. Spinach. Onions. Leeks. Peas. Squash. Tomatoes. Peppers. Radishes. ? Fruits: Apples. Apricots. Avocado. Berries. Bananas. Cherries. Dates. Figs. Grapes. Lemons. Melon. Oranges. Peaches. Plums. Pomegranate. ? Meats and other protein foods: Beans. Almonds. Sunflower seeds. Pine nuts. Peanuts. [place]. Salmon. Scall[place]. Shrimp. [place]. Tilapia. Clams. Oysters. Eggs. ? Dairy: Low-fat milk. Cheese. Greek yogurt. ? Beverages: Water. Red wine. Herbal tea. ? Fats and oils: Extra virgin olive oil. Avocado oil. Grape seed oil. ? Sweets and desserts: [place] yogurt with honey. Baked apples. Poached pears. Trail mix. ? Seasoning and other foods: Basil. Cilantro. Coriander. Cumin. Mint. Parsley. Sage. Rosemary. Tarragon. Garlic. Oregano. Thyme. Pepper. Balsalmic vinegar. Tahini. Hummus. Tomato sauce. Olives. Mushrooms. ? Limit these ? Grains: Prepackaged pasta or rice dishes. Prepackaged cereal with added sugar. ? Vegetables: Deep fried potatoes (french fries). ? Fruits: Fruit canned in syrup. ? Meats and other protein foods: Beef. Pork. Lamb. Poultry with skin. Hot dogs. Bernie[place] Salines. ? Dairy: Ice cream. Sour cream. Whole milk. ? Beverages: Juice. Sugar-sweetened soft drinks. Beer. Liquor and spirits. ? Fats and oils: Butter. [place]ola oil. Vegetable oil. Beef fat (tallow). Lard. ? Sweets and desserts: Cookies. Cakes. Pies. Candy. ? Seasoning and other foods: Mayonnaise. Premade sauces and marinades. ? The items listed may not be a complete list. Talk with your dietitian about what dietary choices are right for you. Summary  The Mediterranean diet  includes both food and lifestyle choices.  Eat a variety of fresh fruits and vegetables, beans, nuts, seeds, and whole grains.  Limit the amount of red meat and sweets that you eat.  Talk with your health care provider about whether it is safe for you to drink red wine in moderation. This means 1 glass a day for nonpregnant women and 2 glasses a day for men. A glass of wine equals 5 oz (150 mL). This information is not intended to replace advice given to you by your health care provider. Make sure you discuss any questions you have with your health care provider. Document Released: 03/04/2016 Document Revised: 04/06/2016 Document Reviewed: 03/04/2016 Elsevier Interactive Patient Education  2019 [place], Elouise Munroe, MD  09/19/2018 4:40 PM    [facility]

## 2018-09-20 ENCOUNTER — Ambulatory Visit: Payer: Medicare Other | Admitting: Physical Therapy

## 2018-09-20 ENCOUNTER — Encounter: Payer: Self-pay | Admitting: Physical Therapy

## 2018-09-20 VITALS — BP 114/84 | HR 99

## 2018-09-20 DIAGNOSIS — R2681 Unsteadiness on feet: Secondary | ICD-10-CM

## 2018-09-20 DIAGNOSIS — R293 Abnormal posture: Secondary | ICD-10-CM

## 2018-09-20 DIAGNOSIS — M6281 Muscle weakness (generalized): Secondary | ICD-10-CM | POA: Diagnosis not present

## 2018-09-20 DIAGNOSIS — R269 Unspecified abnormalities of gait and mobility: Secondary | ICD-10-CM

## 2018-09-20 NOTE — Therapy (Signed)
[facility] [address] [place], [place], [address] Phone: [phone]   Fax:  [phone]  Physical Therapy Treatment and D/C Summary  Patient Details  Name: [name] MRN: [id number] Date of Birth: 1940/11/21 Referring Provider (PT): Dr. Alysia Penna   Encounter Date: 09/20/2018  PT End of Session - 09/20/18 1252    Visit Number  17    Number of Visits  21    Date for PT Re-Evaluation  10/02/18    Authorization Type  [employer] Medicare-* requires 10th visit PN    PT Start Time  1152    PT Stop Time  1238    PT Time Calculation (min)  46 min    Activity Tolerance  Patient tolerated treatment well    Behavior During Therapy  [facility] for tasks assessed/performed       Past Medical History:  Diagnosis Date  . Anxiety   . Back ache   . Breast cancer ([place])    right  . Bronchitis   . Cancer of colon ([place])   . Depression   . DM (diabetes mellitus) ([place])   . Dyspnea on exertion   . Fall on steps    age 41 feel down iron stairs couldnot move leg for 6 mths  . Family history of primary TB   . Fracture of ankle, closed 2014   left  . History of chicken pox   . History of hysterectomy 04/1980  . History of primary TB   . Hyperlipidemia   . Hypertension   . Kidney disease, chronic, stage III (moderate, EGFR 30-59 ml/min) ([place]) 2017  . Kidney disorder    reduction function  . Kidney stones   . Knee pain, right   . Lung abnormality    age 89 patient had pna spot on lungs   . Microalbuminuria   . Muscle cramps    both legs  . Muscular degeneration    of right eye  . Nephrolithiasis   . Palpitations   . Personal history of radiation therapy   . Stroke [facility])     Past Surgical History:  Procedure Laterality Date  . ABLATION  2007   fast heart rate  . ANKLE FRACTURE SURGERY Left 2014  . BREAST LUMPECTOMY Right   . CATARACT EXTRACTION Right 2017  . colonscopy  2015  . HYSTEROTOMY     45 years ago  .  TONSILLECTOMY AND ADENOIDECTOMY     age 34    Vitals:   09/20/18 1157  BP: 114/84  Pulse: 99    Subjective Assessment - 09/20/18 1156    Subjective  "I'm ready to stop."  Pt is still not driving yet but son is planning to take her out driving.    Pertinent History  CVA, Small vessel disease, DM type 2 in nonobese, Stage 3 CKD, HLD, hx of R Breast cancer (TAKE BP ON L SIDE), Colon cancer, TIA, Dyspnea on exertion, Essential HTN, Sinus Tachycardia, Anxiety and depression    Limitations  Lifting;Standing;Walking    How long can you walk comfortably?  Reports she stands to cook, perform laundry, take out garbage and is currently only ambulating around house. She performs walking around neighborhood with her son for 20 min durations with cane.     Patient Stated Goals  Reports goals of driving her car again, walking more with her cane, and return to PLOF.     Currently in Pain?  No/denies  Access Code: [email or web address]  URL: [email or web address]  Date: 09/20/2018  Prepared by: Misty Stanley   Exercises  Standing Single Leg Stance with Counter Support - 3 sets - 10 seconds hold - 1x daily - 7x weekly  Standing Bicep Curls Supinated with Dumbbells - 12 reps - 1 sets - 1x daily - 5x weekly  Standing Triceps Extension - 12 reps - 1 sets - 1x daily - 5x weekly  Shoulder Overhead Press in Flexion with Dumbbells - 12 reps - 1 sets - 1x daily - 5x weekly  Standing Shoulder Abduction with Bent Elbow with Dumbbell - 12 reps - 1 sets - 1x daily - 5x weekly  Standing Single Arm Scapular Protraction with Dumbbell - 10 reps - 1 sets - 1x daily - 5x weekly  Shoulder External Rotation - 10 reps - 1 sets - 1x daily - 5x weekly  Standing Repeated Hip Abduction with Resistance - 10 reps - 2 sets - 1x daily - 5x weekly  Hip and Knee Flexion with Anchored Resistance - 10 reps - 2 sets - 1x daily - 5x weekly  Standing Repeated Hip Adduction with Resistance - 10 reps - 2 sets - 1x daily - 5x weekly   Standing Repeated Hip Extension with Resistance - 10 reps - 3 sets - 1x daily - 5x weekly  Half Tandem Stance Balance with Head Nods - 10 reps - 2 sets - 1x daily - 7x weekly  Half Tandem Stance Balance with Head Rotation - 10 reps - 2 sets - 1x daily - 7x weekly  Heel Walking with Counter Support - 10 reps - 3 sets - 1x daily - 7x weekly  Standing Tandem Balance with Unilateral Counter Support - 3 sets - 10 seconds hold - 1x daily - 7x weekly  Romberg Stance with Eyes Closed - 3 sets - 10 seconds hold - 1x daily - 7x weekly        PT Education - 09/20/18 1251    Education Details  final HEP and D/C today    Person(s) Educated  Patient    Methods  Explanation;Demonstration;Handout    Comprehension  Verbalized understanding;Returned demonstration       PT Short Term Goals - 08/03/18 1156      PT SHORT TERM GOAL #1   Title  Pt will demonstrate independence with strengthening UE and LE HEP with hand weights, ankle weights or theraband    Time  4    Period  Weeks    Status  New    Target Date  09/02/18      PT SHORT TERM GOAL #2   Title  5x sit to stand will decrease to </= 15 seconds without UE support    Baseline  17 seconds    Time  4    Period  Weeks    Status  Revised    Target Date  09/02/18      PT SHORT TERM GOAL #3   Title  Will improve gait velocity to >/= 3.2 ft/sec without cane    Baseline  3 ft/sec    Time  4    Period  Weeks    Status  Revised    Target Date  09/02/18      PT SHORT TERM GOAL #4   Title  Pt will improve her Berg score to >/= 51/56 indicating reduction in fall risk potential     Baseline  49/56    Time  4    Period  Weeks    Status  New    Target Date  09/02/18      PT SHORT TERM GOAL #5   Title  Will improve FGA to >/= 23/30 without use of cane    Baseline  21/30    Time  4    Period  Weeks    Status  New    Target Date  09/02/18        PT Long Term Goals - 09/20/18 1254      PT LONG TERM GOAL #1   Title  Will demonstrate  independence with walking program, balance and strengthening HEP    Time  8    Period  Weeks    Status  Achieved      PT LONG TERM GOAL #2   Title  Pt will improve 5xSTS to </=13 seconds from standard height chair with no UE assist to reduce fall risk potential and demonstrate improvement in LE strength     Baseline  13 seconds    Time  8    Period  Weeks    Status  Achieved      PT LONG TERM GOAL #3   Title  Will improve gait velocity to >/= 3.4 ft/sec without cane    Baseline  2.8 ft/sec    Time  8    Period  Weeks    Status  Not Met      PT LONG TERM GOAL #4   Title  Pt will improve her Berg score to >/= 53/56 indicating reduction in fall risk potential     Baseline  51/56    Time  8    Period  Weeks    Status  Partially Met      PT LONG TERM GOAL #5   Title  Pt will ambulate 500 feet outdoors over uneven pavement, up/down curb and over obstacles without AD, supervision.  Will negotiate 8 steps utilizing reciprocal stepping pattern without cane or rail with supervision    Time  8    Period  Weeks    Status  Achieved      PT LONG TERM GOAL #6   Title  Pt will improve FGA score to >25/30 indicating reduction in fall risk potential during dynamic gait    Baseline  23/30     Time  8    Period  Weeks    Status  Partially Met            Plan - 09/20/18 1253    Clinical Impression Statement  Final visit focused on review and revision of balance exercises for final HEP.  Pt return demonstrated all exercises with therapist providing pt with safety recommendations while performing exercises at home.  Pt has made good progress and has met 3/6 LTG, partially met 2 LTG and did not meet gait velocity goal but did improve gait velocity overall since eval.  Pt is agreeable to D/C today and is independent with HEP.    Rehab Potential  Good    Clinical Impairments Affecting Rehab Potential  relies on family for transportation. Pt reports she has high anxiety when ambulating in  clinic and is taking medication for this.     PT Frequency  Other (comment)   2x/week the first week + 1x/week for 7 weeks   PT Duration  8 weeks    PT Treatment/Interventions  ADLs/Self Care Home Management;Balance training;Neuromuscular re-education;DME Instruction;Gait training;Patient/family education;Stair training;Electrical Stimulation;Functional mobility training;Therapeutic activities;Therapeutic exercise;Passive range of  motion;Aquatic Therapy    PT Next Visit Plan  --    PT Home Exercise Plan  [id number]     Consulted and Agree with Plan of Care  Patient       Patient will benefit from skilled therapeutic intervention in order to improve the following deficits and impairments:  Abnormal gait, Decreased strength, Decreased balance, Decreased mobility, Postural dysfunction, Decreased activity tolerance, Decreased endurance, Difficulty walking  Visit Diagnosis: Muscle weakness (generalized)  Unsteadiness on feet  Abnormality of gait and mobility  Abnormal posture     Problem List Patient Active Problem List   Diagnosis Date Noted  . 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 Labile blood pressure   . Elevated blood pressure reading   . CKD (chronic kidney disease), stage III ([place])   . Diabetes mellitus type 2 in obese ([place])   . Small vessel disease ([place]) 03/28/2018  . Diabetes mellitus type 2 in nonobese ([facility])   . Anxiety state   . Stage 3 chronic kidney disease ([place])   . Hyperlipidemia   . History of breast cancer   . Acute ischemic stroke ([place])   . TIA (transient ischemic attack) 03/24/2018  . Dyspnea on exertion 05/30/2017  . Sinus tachycardia 05/30/2017  . Mixed hyperlipidemia 05/30/2017  . Essential hypertension 05/30/2017   PHYSICAL THERAPY DISCHARGE SUMMARY  Visits from Start of Care: 17  Current functional level related to goals / functional outcomes: See impression statement and LTG achievement above   Remaining deficits: Mild L sided weakness,  imbalance   Education / Equipment: HEP for balance, UE strength and LE strength  Plan: Patient agrees to discharge.  Patient goals were met. Patient is being discharged due to meeting the stated rehab goals.  ?????    Rico Junker, PT, DPT 09/20/18    12:58 PM     [facility] [address] [place], [place], [address] Phone: [phone]   Fax:  [phone]  Name: [name] MRN: [id number] Date of Birth: [birth year]

## 2018-09-20 NOTE — Patient Instructions (Addendum)
Access Code: [email or web address]  URL: [email or web address]  Date: 09/20/2018  Prepared by: Misty Stanley   Exercises  Standing Single Leg Stance with Counter Support - 3 sets - 10 seconds hold - 1x daily - 7x weekly  Standing Bicep Curls Supinated with Dumbbells - 12 reps - 1 sets - 1x daily - 5x weekly  Standing Triceps Extension - 12 reps - 1 sets - 1x daily - 5x weekly  Shoulder Overhead Press in Flexion with Dumbbells - 12 reps - 1 sets - 1x daily - 5x weekly  Standing Shoulder Abduction with Bent Elbow with Dumbbell - 12 reps - 1 sets - 1x daily - 5x weekly  Standing Single Arm Scapular Protraction with Dumbbell - 10 reps - 1 sets - 1x daily - 5x weekly  Shoulder External Rotation - 10 reps - 1 sets - 1x daily - 5x weekly  Standing Repeated Hip Abduction with Resistance - 10 reps - 2 sets - 1x daily - 5x weekly  Hip and Knee Flexion with Anchored Resistance - 10 reps - 2 sets - 1x daily - 5x weekly  Standing Repeated Hip Adduction with Resistance - 10 reps - 2 sets - 1x daily - 5x weekly  Standing Repeated Hip Extension with Resistance - 10 reps - 3 sets - 1x daily - 5x weekly  Half Tandem Stance Balance with Head Nods - 10 reps - 2 sets - 1x daily - 7x weekly  Half Tandem Stance Balance with Head Rotation - 10 reps - 2 sets - 1x daily - 7x weekly  Heel Walking with Counter Support - 10 reps - 3 sets - 1x daily - 7x weekly  Standing Tandem Balance with Unilateral Counter Support - 3 sets - 10 seconds hold - 1x daily - 7x weekly  Romberg Stance with Eyes Closed - 3 sets - 10 seconds hold - 1x daily - 7x weekly

## 2018-10-13 ENCOUNTER — Ambulatory Visit: Payer: Medicare Other | Admitting: Physical Medicine & Rehabilitation

## 2018-11-06 ENCOUNTER — Encounter: Payer: Self-pay | Admitting: Physical Medicine & Rehabilitation

## 2018-11-06 ENCOUNTER — Encounter: Payer: Medicare Other | Attending: Physical Medicine & Rehabilitation

## 2018-11-06 ENCOUNTER — Ambulatory Visit (HOSPITAL_BASED_OUTPATIENT_CLINIC_OR_DEPARTMENT_OTHER): Payer: Medicare Other | Admitting: Physical Medicine & Rehabilitation

## 2018-11-06 ENCOUNTER — Other Ambulatory Visit: Payer: Self-pay

## 2018-11-06 VITALS — Ht 67.0 in | Wt 183.0 lb

## 2018-11-06 DIAGNOSIS — Z923 Personal history of irradiation: Secondary | ICD-10-CM | POA: Insufficient documentation

## 2018-11-06 DIAGNOSIS — N183 Chronic kidney disease, stage 3 (moderate): Secondary | ICD-10-CM | POA: Insufficient documentation

## 2018-11-06 DIAGNOSIS — R269 Unspecified abnormalities of gait and mobility: Secondary | ICD-10-CM

## 2018-11-06 DIAGNOSIS — I129 Hypertensive chronic kidney disease with stage 1 through stage 4 chronic kidney disease, or unspecified chronic kidney disease: Secondary | ICD-10-CM | POA: Insufficient documentation

## 2018-11-06 DIAGNOSIS — I10 Essential (primary) hypertension: Secondary | ICD-10-CM

## 2018-11-06 DIAGNOSIS — I639 Cerebral infarction, unspecified: Secondary | ICD-10-CM | POA: Insufficient documentation

## 2018-11-06 DIAGNOSIS — I69398 Other sequelae of cerebral infarction: Secondary | ICD-10-CM

## 2018-11-06 DIAGNOSIS — I69354 Hemiplegia and hemiparesis following cerebral infarction affecting left non-dominant side: Secondary | ICD-10-CM | POA: Diagnosis not present

## 2018-11-06 DIAGNOSIS — E1122 Type 2 diabetes mellitus with diabetic chronic kidney disease: Secondary | ICD-10-CM | POA: Insufficient documentation

## 2018-11-06 DIAGNOSIS — F329 Major depressive disorder, single episode, unspecified: Secondary | ICD-10-CM | POA: Insufficient documentation

## 2018-11-06 DIAGNOSIS — Z853 Personal history of malignant neoplasm of breast: Secondary | ICD-10-CM | POA: Insufficient documentation

## 2018-11-06 DIAGNOSIS — E785 Hyperlipidemia, unspecified: Secondary | ICD-10-CM | POA: Insufficient documentation

## 2018-11-06 NOTE — Progress Notes (Signed)
Subjective:    Patient ID: [name], female    DOB: [birth year], 78 y.o.   MRN: [id number] 78 year old right-handed female with history of tachycardia, status post ablation in 2007, diabetes mellitus, CKD stage III. Lives alone, independent prior to admission. Family provides Doctor, hospital. A 1-level home.  Presented 03/24/2018 with left-sided weakness, facial droop, slurred speech. Cranial CT scan negative. The patient did receive tPA. 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 No significant proximal  stenosis, aneurysm or branch vessel occlusion. Echocardiogram with ejection fraction of 23%, grade I diastolic dysfunction. Carotid Dopplers negative for ICA stenosis. Neurology consulted. Placed on aspirin and Plavix therapy ADMIT DATE: 03/28/2018  DISCHARGE DATE: 04/12/2018 HPI   Modified Independent with self care and mobility Walks 84mn per day outside the home Pt is self quarantining due to Covid 19, no resp sx Son and Daughter in l[employer] coachbuy groceries Started driving ~~3AQTago but then stopped going out.  No reported difficulties with driving   Pain Inventory Average Pain 0 Pain Right Now 0 My pain is na  In the last 24 hours, has pain interfered with the following? General activity 0 Relation with others 0 Enjoyment of life 0 What TIME of day is your pain at its worst? na Sleep (in general) Good  Pain is worse with: na Pain improves with: na Relief from Meds: na  Mobility ability to climb steps?  yes do you drive?  yes  Function retired  Neuro/Psych No problems in this area  Prior Studies Any changes since last visit?  no  Physicians involved in your care Any changes since last visit?  no   Family History  Problem Relation Age of Onset  . Peripheral Artery Disease Mother [id number]      HX OF POOR CIRCULATION, SMOKING, LEG AMPUTATION  . Cancer Father        cancer of spine  . Asthma  Brother 777 . Kidney Stones Brother   . Breast cancer Neg Hx    Social History   Socioeconomic History  . Marital status: Divorced    Spouse name: Not on file  . Number of children: 2  . Years of education: Not on file  . Highest education level: Not on file  Occupational History  . Not on file  Social Needs  . Financial resource strain: Not on file  . Food insecurity:    Worry: Not on file    Inability: Not on file  . Transportation needs:    Medical: Not on file    Non-medical: Not on file  Tobacco Use  . Smoking status: Never Smoker  . Smokeless tobacco: Never Used  Substance and Sexual Activity  . Alcohol use: No  . Drug use: No  . Sexual activity: Not Currently  Lifestyle  . Physical activity:    Days per week: Not on file    Minutes per session: Not on file  . Stress: Not on file  Relationships  . Social connections:    Talks on phone: Not on file    Gets together: Not on file    Attends religious service: Not on file    Active member of club or organization: Not on file    Attends meetings of clubs or organizations: Not on file    Relationship status: Not on file  Other Topics Concern  . Not on file  Social History Narrative  . Not on file   Past Surgical  History:  Procedure Laterality Date  . ABLATION  2007   fast heart rate  . ANKLE FRACTURE SURGERY Left 2014  . BREAST LUMPECTOMY Right   . CATARACT EXTRACTION Right 2017  . colonscopy  2015  . HYSTEROTOMY     45 years ago  . TONSILLECTOMY AND ADENOIDECTOMY     age 57   Past Medical History:  Diagnosis Date  . Anxiety   . Back ache   . Breast cancer ([place])    right  . Bronchitis   . Cancer of colon ([place])   . Depression   . DM (diabetes mellitus) ([place])   . Dyspnea on exertion   . Fall on steps    age 79 feel down iron stairs couldnot move leg for 6 mths  . Family history of primary TB   . Fracture of ankle, closed 2014   left  . History of chicken pox   . History of hysterectomy 04/1980   . History of primary TB   . Hyperlipidemia   . Hypertension   . Kidney disease, chronic, stage III (moderate, EGFR 30-59 ml/min) ([place]) 2017  . Kidney disorder    reduction function  . Kidney stones   . Knee pain, right   . Lung abnormality    age 67 patient had pna spot on lungs   . Microalbuminuria   . Muscle cramps    both legs  . Muscular degeneration    of right eye  . Nephrolithiasis   . Palpitations   . Personal history of radiation therapy   . Stroke ([place])    Ht 5' 7"  (1.702 m)   Wt 183 lb (83 kg)   BMI 28.66 kg/m   Opioid Risk Score:   Fall Risk Score:  `1  Depression screen PHQ 2/9  Depression screen [facility] 2/9 08/14/2018 04/25/2018  Decreased Interest 1 0  Down, Depressed, Hopeless 1 0  PHQ - 2 Score 2 0  Altered sleeping - 0  Tired, decreased energy - 1  Change in appetite - 0  Feeling bad or failure about yourself  - 0  Trouble concentrating - 1  Moving slowly or fidgety/restless - 0  Suicidal thoughts - 0  PHQ-9 Score - 2  Difficult doing work/chores - Somewhat difficult     Review of Systems  Constitutional: Negative.   HENT: Negative.   Eyes: Negative.   Respiratory: Negative.   Cardiovascular: Negative.   Gastrointestinal: Negative.   Endocrine: Negative.   Genitourinary: Negative.   Musculoskeletal: Negative.   Skin: Negative.   Allergic/Immunologic: Negative.   Neurological: Negative.   Hematological: Negative.   Psychiatric/Behavioral: Negative.   All other systems reviewed and are negative.      Objective:   Physical Exam  No examination due to phone visit Speech without evidence of dysarthria or aphasia      Assessment & Plan:  1.  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  She has had an excellent functional recovery.  The patient is back to independent level with all self-care and mobility however she feels like her balance has not returned to normal.  She has to be more careful than she used to be on steps and also tries not to  hurry.  She does not require any further formalized PT OT at this time but may benefit from a community-based exercise program This will not be possible until Covid restrictions are lifted  I will see the patient back in 6 weeks, will transition to a  community-based exercise program. Also advised patient to get her own blood pressure cuff.  She will talk to her son about this

## 2018-11-15 ENCOUNTER — Ambulatory Visit: Payer: Medicare Other | Admitting: Adult Health

## 2019-01-02 DIAGNOSIS — I130 Hypertensive heart and chronic kidney disease with heart failure and stage 1 through stage 4 chronic kidney disease, or unspecified chronic kidney disease: Secondary | ICD-10-CM | POA: Insufficient documentation

## 2019-02-21 ENCOUNTER — Telehealth: Payer: Self-pay | Admitting: *Deleted

## 2019-02-21 NOTE — Telephone Encounter (Signed)

## 2019-02-27 ENCOUNTER — Ambulatory Visit: Payer: Self-pay | Admitting: Adult Health

## 2019-02-28 ENCOUNTER — Encounter: Payer: Self-pay | Admitting: Adult Health

## 2019-03-01 ENCOUNTER — Telehealth: Payer: Medicare Other | Admitting: Internal Medicine

## 2019-07-30 ENCOUNTER — Ambulatory Visit: Payer: Medicare Other | Admitting: Internal Medicine

## 2019-07-30 NOTE — Progress Notes (Deleted)
Cardiology Office Note:    Date:  07/30/2019   ID:  [name], DOB Feb 05, 1941, MRN [id number]  PCP:  Crist Infante, MD  Cardiologist:  Nelva Bush, MD  Electrophysiologist:  None   Referring MD: Crist Infante, MD   Chief Complaint: cardiovascular follow up.  History of Present Illness:    [name] is a 79 y.o. female with a hx of tachycardia status post ablation in 2007, hypertension, hyperlipidemia, type 2 diabetes mellitus, chronic kidney disease stage III,breast cancer status post right lumpectomy and radiation, and colon cancerpresents for follow up.   TTE (06/14/17): Normal LV size. LVEF 65-70% with normal wall motion. Grade 1 diastolic dysfunction. Trileaflet aortic valve with mild thickening. No stenosis or regurgitation. Trivial MR, TR, and PR. Normal RV size and function. Normal PA pressure.  Admitted 02/2018 with acute CVA.Echocardiogram with ejection fraction of 23%, grade I diastolic dysfunction. Carotid Dopplers negative for ICA stenosis.Patient was started on dual antiplatelet therapy aspirin and Plavixx 3 months. Currently on Plavix only.  Last follow up in cardiology was 09/19/2018. She has been overall well since that time. We discussed best ways to maintain good diet and lifestyle for optimal cardiovascular health. She endorses mild shortness of breath on exertion, and has recently had a negative ischemic and structural workup.    The patient denies chest pain, chest pressure, dyspnea at rest or with exertion, palpitations, PND, orthopnea, or leg swelling. Denies cough, fever, chills. Denies nausea, vomiting. Denies syncope or presyncope. Denies dizziness or lightheadedness. Denies snoring.  Past Medical History:  Diagnosis Date  . Anxiety   . Back ache   . Breast cancer ([place])    right  . Bronchitis   . Cancer of colon ([place])   . Depression   . DM (diabetes mellitus) ([place])   . Dyspnea on exertion   . Fall on steps    age 66 feel down  iron stairs couldnot move leg for 6 mths  . Family history of primary TB   . Fracture of ankle, closed 2014   left  . History of chicken pox   . History of hysterectomy 04/1980  . History of primary TB   . Hyperlipidemia   . Hypertension   . Kidney disease, chronic, stage III (moderate, EGFR 30-59 ml/min) ([place]) 2017  . Kidney disorder    reduction function  . Kidney stones   . Knee pain, right   . Lung abnormality    age 56 patient had pna spot on lungs   . Microalbuminuria   . Muscle cramps    both legs  . Muscular degeneration    of right eye  . Nephrolithiasis   . Palpitations   . Personal history of radiation therapy   . Stroke [facility])     Past Surgical History:  Procedure Laterality Date  . ABLATION  2007   fast heart rate  . ANKLE FRACTURE SURGERY Left 2014  . BREAST LUMPECTOMY Right   . CATARACT EXTRACTION Right 2017  . colonscopy  2015  . HYSTEROTOMY     45 years ago  . TONSILLECTOMY AND ADENOIDECTOMY     age 79    Current Medications: No outpatient medications have been marked as taking for the 07/30/19 encounter (Appointment) with Elouise Munroe, MD.     Allergies:   Patient has no known allergies.   Social History   Socioeconomic History  . Marital status: Divorced    Spouse name: Not on file  . Number of children:  2  . Years of education: Not on file  . Highest education level: Not on file  Occupational History  . Not on file  Tobacco Use  . Smoking status: Never Smoker  . Smokeless tobacco: Never Used  Substance and Sexual Activity  . Alcohol use: No  . Drug use: No  . Sexual activity: Not Currently  Other Topics Concern  . Not on file  Social History Narrative  . Not on file   Social Determinants of Health   Financial Resource Strain:   . Difficulty of Paying Living Expenses: Not on file  Food Insecurity:   . Worried About Charity fundraiser in the Last Year: Not on file  . Ran Out of Food in the Last Year: Not on file    Transportation Needs:   . Lack of Transportation (Medical): Not on file  . Lack of Transportation (Non-Medical): Not on file  Physical Activity:   . Days of Exercise per Week: Not on file  . Minutes of Exercise per Session: Not on file  Stress:   . Feeling of Stress : Not on file  Social Connections:   . Frequency of Communication with Friends and Family: Not on file  . Frequency of Social Gatherings with Friends and Family: Not on file  . Attends Religious Services: Not on file  . Active Member of Clubs or Organizations: Not on file  . Attends Archivist Meetings: Not on file  . Marital Status: Not on file     Family History: The patient's ***family history includes Asthma (age of onset: 7) in her brother; Cancer in her father; Kidney Stones in her brother; Peripheral Artery Disease (age of onset: 53) in her mother. There is no history of Breast cancer.  ROS:   Please see the history of present illness.    All other systems reviewed and are negative.  EKGs/Labs/Other Studies Reviewed:    The following studies were reviewed today:  EKG:  ***  Recent Labs: No results found for requested labs within last 8760 hours.  Recent Lipid Panel    Component Value Date/Time   CHOL 189 03/25/2018 0247   TRIG 578 (H) 03/25/2018 0247   HDL 25 (L) 03/25/2018 0247   CHOLHDL 7.6 03/25/2018 0247   VLDL UNABLE TO CALCULATE IF TRIGLYCERIDE OVER 400 mg/dL 03/25/2018 0247   LDLCALC UNABLE TO CALCULATE IF TRIGLYCERIDE OVER 400 mg/dL 03/25/2018 0247    Physical Exam:    VS:  There were no vitals taken for this visit.    Wt Readings from Last 5 Encounters:  11/06/18 183 lb (83 kg)  09/19/18 183 lb 9.6 oz (83.3 kg)  08/14/18 185 lb 9.6 oz (84.2 kg)  07/03/18 184 lb (83.5 kg)  05/23/18 181 lb 6.4 oz (82.3 kg)     Constitutional: No acute distress Eyes: sclera non-icteric, normal conjunctiva and lids ENMT: normal dentition, moist mucous membranes Cardiovascular: regular  rhythm, normal rate, no murmurs. S1 and S2 normal. Radial pulses normal bilaterally. No jugular venous distention.  Respiratory: clear to auscultation bilaterally GI : normal bowel sounds, soft and nontender. No distention.   MSK: extremities warm, well perfused. No edema.  NEURO: grossly nonfocal exam, moves all extremities. PSYCH: alert and oriented x 3, normal mood and affect.      ASSESSMENT:    No diagnosis found. PLAN:     TIME SPENT WITH PATIENT: *** minutes of direct patient care. More than 50% of that time was spent on coordination  of care and counseling regarding ***.  Cherlynn Kaiser, MD [place]  [facility]   Medication Adjustments/Labs and Tests Ordered: Current medicines are reviewed at length with the patient today.  Concerns regarding medicines are outlined above.  No orders of the defined types were placed in this encounter.  No orders of the defined types were placed in this encounter.   There are no Patient Instructions on file for this visit.

## 2019-09-06 ENCOUNTER — Encounter: Payer: Self-pay | Admitting: Internal Medicine

## 2019-09-06 ENCOUNTER — Telehealth (INDEPENDENT_AMBULATORY_CARE_PROVIDER_SITE_OTHER): Payer: Medicare Other | Admitting: Internal Medicine

## 2019-09-06 VITALS — Ht 67.5 in | Wt 185.0 lb

## 2019-09-06 DIAGNOSIS — Z8673 Personal history of transient ischemic attack (TIA), and cerebral infarction without residual deficits: Secondary | ICD-10-CM

## 2019-09-06 DIAGNOSIS — E782 Mixed hyperlipidemia: Secondary | ICD-10-CM | POA: Diagnosis not present

## 2019-09-06 DIAGNOSIS — E118 Type 2 diabetes mellitus with unspecified complications: Secondary | ICD-10-CM | POA: Diagnosis not present

## 2019-09-06 DIAGNOSIS — I10 Essential (primary) hypertension: Secondary | ICD-10-CM | POA: Diagnosis not present

## 2019-09-06 DIAGNOSIS — R0600 Dyspnea, unspecified: Secondary | ICD-10-CM

## 2019-09-06 DIAGNOSIS — I639 Cerebral infarction, unspecified: Secondary | ICD-10-CM

## 2019-09-06 DIAGNOSIS — R0609 Other forms of dyspnea: Secondary | ICD-10-CM

## 2019-09-06 NOTE — Patient Instructions (Signed)
Medication Instructions:  Your physician recommends that you continue on your current medications as directed. Please refer to the Current Medication list given to you today.  *If you need a refill on your cardiac medications before your next appointment, please call your pharmacy*  Lab Work: NONE  Testing/Procedures: NONE  Follow-Up: At [facility], you and your health needs are our priority.  As part of our continuing mission to provide you with exceptional heart care, we have created designated Provider Care Teams.  These Care Teams include your primary Cardiologist (physician) and Advanced Practice Providers (APPs -  Physician Assistants and Nurse Practitioners) who all work together to provide you with the care you need, when you need it.  Your next appointment:   6 month(s)  The format for your next appointment:   In Person  Provider:   Cherlynn Kaiser, MD

## 2019-09-06 NOTE — Progress Notes (Signed)
Virtual Visit via Telephone Note   This visit type was conducted due to national recommendations for restrictions regarding the COVID-19 Pandemic (e.g. social distancing) in an effort to limit this patient's exposure and mitigate transmission in our community.  Due to her co-morbid illnesses, this patient is at least at moderate risk for complications without adequate follow up.  This format is felt to be most appropriate for this patient at this time.  The patient did not have access to video technology/had technical difficulties with video requiring transitioning to audio format only (telephone).  All issues noted in this document were discussed and addressed.  No physical exam could be performed with this format.  Please refer to the patient's chart for her  consent to telehealth for [facility].   Date:  09/06/2019   ID:  [name], DOB 04/30/1941, MRN [id number]  Patient Location: Home Provider Location: Home  PCP:  Crist Infante, MD  Cardiologist:  Nelva Bush, MD  Electrophysiologist:  None   Evaluation Performed:  Follow-Up Visit  Chief Complaint:  Cardiovascular follow up  History of Present Illness:    [name] is a 79 y.o. female with tachycardia status post ablation in 2007, hypertension, hyperlipidemia, type 2 diabetes mellitus, chronic kidney disease stage III,breast cancer status post right lumpectomy and radiation, and colon cancerpresents for follow up.  She tells me her palpitations are gone which is excellent, she overall feels ok. No chest pain or worrisome shortness of breath. She takes Lasix 20 mg daily. She notes daily fatigue, and attributes this to not being able to do therapy after her stroke due the COVID 19 pandemic.  Negative ischemic and structural workup in 2019. No new symptoms.  The patient does not have symptoms concerning for COVID-19 infection (fever, chills, cough, or new shortness of breath).    Past Medical History:  Diagnosis  Date  . Anxiety   . Back ache   . Breast cancer ([place])    right  . Bronchitis   . Cancer of colon ([place])   . Depression   . DM (diabetes mellitus) ([place])   . Dyspnea on exertion   . Fall on steps    age 40 feel down iron stairs couldnot move leg for 6 mths  . Family history of primary TB   . Fracture of ankle, closed 2014   left  . History of chicken pox   . History of hysterectomy 04/1980  . History of primary TB   . Hyperlipidemia   . Hypertension   . Kidney disease, chronic, stage III (moderate, EGFR 30-59 ml/min) 2017  . Kidney disorder    reduction function  . Kidney stones   . Knee pain, right   . Lung abnormality    age 42 patient had pna spot on lungs   . Microalbuminuria   . Muscle cramps    both legs  . Muscular degeneration    of right eye  . Nephrolithiasis   . Palpitations   . Personal history of radiation therapy   . Stroke [facility])    Past Surgical History:  Procedure Laterality Date  . ABLATION  2007   fast heart rate  . ANKLE FRACTURE SURGERY Left 2014  . BREAST LUMPECTOMY Right   . CATARACT EXTRACTION Right 2017  . colonscopy  2015  . HYSTEROTOMY     45 years ago  . TONSILLECTOMY AND ADENOIDECTOMY     age 80     Current Meds  Medication Sig  .  acetaminophen (TYLENOL) 325 MG tablet Take 2 tablets (650 mg total) by mouth every 4 (four) hours as needed for mild pain (or temp > 37.5 C (99.5 F)).  Marland Kitchen ARIPiprazole (ABILIFY) 5 MG tablet Take 1 tablet (5 mg total) by mouth daily.  Marland Kitchen atorvastatin (LIPITOR) 80 MG tablet Take 1 tablet (80 mg total) by mouth daily at 6 PM.  . clopidogrel (PLAVIX) 75 MG tablet Take 1 tablet (75 mg total) by mouth daily.  Marland Kitchen docusate sodium (COLACE) 100 MG capsule Take 100 mg by mouth 2 (two) times daily.  . furosemide (LASIX) 20 MG tablet Take 20 mg by mouth every morning.  . Insulin Lispro Prot & Lispro (HUMALOG MIX 75/25 [place]) Inject into the skin. INJECT 20U WITH BREAKFAST AND SUPPER. INCREASE AS DIRCTED IN CLINIC.  Marland Kitchen  lisinopril (PRINIVIL,ZESTRIL) 10 MG tablet Take 10 mg by mouth daily.  Marland Kitchen LORazepam (ATIVAN) 0.5 MG tablet Take 1 tablet (0.5 mg total) by mouth 4 (four) times daily.  . mirtazapine (REMERON) 15 MG tablet Take 1 tablet (15 mg total) by mouth at bedtime.  . Multiple Vitamin (MULTIVITAMIN) capsule Take 1 capsule by mouth daily.  . multivitamin-lutein (OCUVITE-LUTEIN) CAPS capsule Take 2 capsules by mouth daily.  Marland Kitchen omega-3 acid ethyl esters (LOVAZA) 1 g capsule Take 1 capsule (1 g total) by mouth 2 (two) times daily.  Glory Rosebush VERIO test strip USE 1 STRIP TO TEST BLOOD GLUCOSE TWICE DAILY DIAG CODE E11.9  . sennosides-docusate sodium (SENOKOT-S) 8.6-50 MG tablet Take 1 tablet by mouth daily.  Marland Kitchen venlafaxine XR (EFFEXOR-XR) 150 MG 24 hr capsule Take 1 capsule by mouth daily.     Allergies:   Patient has no known allergies.   Social History   Tobacco Use  . Smoking status: Never Smoker  . Smokeless tobacco: Never Used  Substance Use Topics  . Alcohol use: No  . Drug use: No     Family Hx: The patient's family history includes Asthma (age of onset: 66) in her brother; Cancer in her father; Kidney Stones in her brother; Peripheral Artery Disease (age of onset: 47) in her mother. There is no history of Breast cancer.  ROS:   Please see the history of present illness.     All other systems reviewed and are negative.   Prior CV studies:   The following studies were reviewed today:    Labs/Other Tests and Data Reviewed:    EKG:  No ECG reviewed.  Recent Labs: No results found for requested labs within last 8760 hours.   Recent Lipid Panel Lab Results  Component Value Date/Time   CHOL 189 03/25/2018 02:47 AM   TRIG 578 (H) 03/25/2018 02:47 AM   HDL 25 (L) 03/25/2018 02:47 AM   CHOLHDL 7.6 03/25/2018 02:47 AM   LDLCALC UNABLE TO CALCULATE IF TRIGLYCERIDE OVER 400 mg/dL 03/25/2018 02:47 AM    Wt Readings from Last 3 Encounters:  09/06/19 185 lb (83.9 kg)  11/06/18 183 lb (83  kg)  09/19/18 183 lb 9.6 oz (83.3 kg)     Objective:    Vital Signs:  Ht 5' 7.5" (1.715 m)   Wt 185 lb (83.9 kg)   BMI 28.55 kg/m    VITAL SIGNS:  reviewed GEN:  no acute distress NEURO:  alert and oriented x 3, no obvious focal deficit PSYCH:  normal affect  ASSESSMENT & PLAN:    1. Essential hypertension  - continue lasix 20 mg daily, lisinopril 10 mg daily. Stable.  2. Mixed hyperlipidemia  - continue atorvastatin 80 mg daily.   3. Type 2 diabetes mellitus - on Humalog injections. Son encourages her to lose weight and get back in shape. We discussed exercise and diet strategies today.  4. Dyspnea on exertion - mild , stable, negative ischemic workup in 2019.   5. Acute ischemic stroke (HCC)  - plavix per neurology, continue, no bleeding complications.     COVID-19 Education: The signs and symptoms of COVID-19 were discussed with the patient and how to seek care for testing (follow up with PCP or arrange E-visit).  The importance of social distancing was discussed today.  Time:   Today, I have spent 20 minutes with the patient with telehealth technology discussing the above problems.     Medication Adjustments/Labs and Tests Ordered: Current medicines are reviewed at length with the patient today.  Concerns regarding medicines are outlined above.   Tests Ordered: No orders of the defined types were placed in this encounter.   Medication Changes: No orders of the defined types were placed in this encounter.   Follow Up:  6 mo   Signed, Elouise Munroe, MD  09/06/2019 8:58 AM    [facility]

## 2019-10-03 ENCOUNTER — Encounter: Payer: Self-pay | Admitting: Nurse Practitioner

## 2019-10-03 ENCOUNTER — Encounter: Payer: Self-pay | Admitting: Internal Medicine

## 2019-10-12 ENCOUNTER — Telehealth: Payer: Self-pay | Admitting: General Surgery

## 2019-10-12 ENCOUNTER — Ambulatory Visit: Payer: Medicare Other | Admitting: Nurse Practitioner

## 2019-10-12 ENCOUNTER — Encounter: Payer: Self-pay | Admitting: Nurse Practitioner

## 2019-10-12 VITALS — BP 136/80 | HR 64 | Temp 98.5°F | Ht 67.5 in | Wt 191.0 lb

## 2019-10-12 DIAGNOSIS — Z85038 Personal history of other malignant neoplasm of large intestine: Secondary | ICD-10-CM

## 2019-10-12 DIAGNOSIS — Z853 Personal history of malignant neoplasm of breast: Secondary | ICD-10-CM | POA: Diagnosis not present

## 2019-10-12 MED ORDER — NA SULFATE-K SULFATE-MG SULF 17.5-3.13-1.6 GM/177ML PO SOLN: 1.0000 | Freq: Once | ORAL | 0 refills | Status: AC

## 2019-10-12 NOTE — Progress Notes (Signed)
10/12/2019 [name] [id number] Jun 14, 1941   CHIEF COMPLAINT: Schedule a colonoscopy   HISTORY OF PRESENT ILLNESS: [name] is a 79 year old female with a past medical history significant for anxiety, depression,  hypertension, sinus tachycardia, CVA 02/2018 on Plavix, diabetes mellitus type II, CKD III, breast cancer diagnosed in 2002 status post right lumpectomy with radiation and oral chemotherapy, colon cancer status post resection 2003 when living in  [place].  She reported having an annual colonoscopy for the next 7 years.  Her most recent colonoscopy was approximately 7 years ago at the [facility]  which she reported was normal.  Past hysterectomy. She presents today accompanied by her daughter-in-law.  She wishes to schedule a colonoscopy at this time.  She denies having any upper or lower abdominal pain.  She is passing normal formed brown bowel movement daily.  No rectal bleeding or melena.  No GERD symptoms.  No complaints today.  Her daughter-in-law is present.  Labs 06/06/2019: Glucose 182.  BUN 43.  Creatinine 2.0.  Sodium 139.  Potassium 5.3.  Albumin 3.5.  AST 20.  ALT 28.  Alk phos 112.  Total bili 0.4.  WBC 5.7.  Hemoglobin 12.0.  Hematocrit 36.0.  MCV 94.3.  Platelet 207.   Past Medical History:  Diagnosis Date  . Anxiety   . Back ache   . Breast cancer ([place])    right  . Bronchitis   . Cancer of colon ([place])   . Depression   . DM (diabetes mellitus) ([place])   . Dyspnea on exertion   . Fall on steps    age 58 feel down iron stairs couldnot move leg for 6 mths  . Family history of primary TB   . Fracture of ankle, closed 2014   left  . History of chicken pox   . History of hysterectomy 04/1980  . History of primary TB   . Hyperlipidemia   . Hypertension   . Kidney disease, chronic, stage III (moderate, EGFR 30-59 ml/min) 2017  . Kidney disorder    reduction function  . Kidney stones   . Knee pain, right   . Lung abnormality    age 11  patient had pna spot on lungs   . Microalbuminuria   . Muscle cramps    both legs  . Muscular degeneration    of right eye  . Nephrolithiasis   . Palpitations   . Personal history of radiation therapy   . Stroke [facility])    Past Surgical History:  Procedure Laterality Date  . ABLATION  2007   fast heart rate  . ANKLE FRACTURE SURGERY Left 2014  . BREAST LUMPECTOMY Right   . CATARACT EXTRACTION Right 2017  . colonscopy  2015  . HYSTEROTOMY     45 years ago  . TONSILLECTOMY AND ADENOIDECTOMY     age 20    reports that she has never smoked. She has never used smokeless tobacco. She reports that she does not drink alcohol or use drugs. family history includes Asthma (age of onset: 56) in her brother; Cancer in her father; Kidney Stones in her brother; Peripheral Artery Disease (age of onset: 50) in her mother.  Father had cancer to his spine. No Known Allergies    Outpatient Encounter Medications as of 10/12/2019  Medication Sig  . acetaminophen (TYLENOL) 325 MG tablet Take 2 tablets (650 mg total) by mouth every 4 (four) hours as needed for mild pain (or temp > 37.5 C (99.5  F)).  Marland Kitchen ARIPiprazole (ABILIFY) 5 MG tablet Take 1 tablet (5 mg total) by mouth daily.  Marland Kitchen atorvastatin (LIPITOR) 80 MG tablet Take 1 tablet (80 mg total) by mouth daily at 6 PM.  . BD PEN NEEDLE NANO 2ND GEN 32G X 4 MM MISC USE WITH TRESIBA DAILY  . clopidogrel (PLAVIX) 75 MG tablet Take 1 tablet (75 mg total) by mouth daily.  Marland Kitchen docusate sodium (COLACE) 100 MG capsule Take 100 mg by mouth 2 (two) times daily.  . furosemide (LASIX) 20 MG tablet Take 10 mg by mouth every morning.   . Insulin Lispro Prot & Lispro (HUMALOG MIX 75/25 [place]) Inject into the skin. INJECT 20U WITH BREAKFAST AND SUPPER. INCREASE AS DIRCTED IN CLINIC.  Marland Kitchen lisinopril (PRINIVIL,ZESTRIL) 10 MG tablet Take 10 mg by mouth daily.  Marland Kitchen LORazepam (ATIVAN) 0.5 MG tablet Take 1 tablet (0.5 mg total) by mouth 4 (four) times daily.  . mirtazapine (REMERON) 15  MG tablet Take 1 tablet (15 mg total) by mouth at bedtime.  . Multiple Vitamin (MULTIVITAMIN) capsule Take 1 capsule by mouth daily.  . multivitamin-lutein (OCUVITE-LUTEIN) CAPS capsule Take 2 capsules by mouth daily.  Marland Kitchen omega-3 acid ethyl esters (LOVAZA) 1 g capsule Take 1 capsule (1 g total) by mouth 2 (two) times daily.  Glory Rosebush VERIO test strip USE 1 STRIP TO TEST BLOOD GLUCOSE TWICE DAILY DIAG CODE E11.9  . sennosides-docusate sodium (SENOKOT-S) 8.6-50 MG tablet Take 1 tablet by mouth daily.  Marland Kitchen venlafaxine XR (EFFEXOR-XR) 150 MG 24 hr capsule Take 1 capsule by mouth daily.   No facility-administered encounter medications on file as of 10/12/2019.     REVIEW OF SYSTEMS: All other systems reviewed and negative except where noted in the History of Present Illness.   PHYSICAL EXAM: BP 136/80   Pulse 64   Temp 98.5 F (36.9 C)   Ht 5' 7.5" (1.715 m)   Wt 191 lb (86.6 kg)   BMI 29.47 kg/m  General: Well developed 79 year old female in no acute distress. Head: Normocephalic and atraumatic. Eyes:  Sclerae non-icteric, conjunctive pink. Ears: Normal auditory acuity. Mouth:  No ulcers or lesions.  Neck: Supple, no lymphadenopathy or thyromegaly.  Lungs: Clear bilaterally to auscultation without wheezes, crackles or rhonchi. Heart: Tachycardic. Regular rhythm. No murmur, rub or gallop appreciated.  Abdomen: Soft, nontender, non distended. No masses. No hepatosplenomegaly. Normoactive bowel sounds x 4 quadrants.  Rectal: Deferred.  Musculoskeletal: Symmetrical with no gross deformities. Skin: Warm and dry. No rash or lesions on visible extremities. Extremities: No edema. Neurological: Alert oriented x 4, no focal deficits.  Psychological:  Alert and cooperative. Normal mood and affect.  ASSESSMENT AND PLAN:  80.  78 year old female with a history of colon cancer status post resection in 2003 while living in [place].  She presents today to schedule a surveillance  colonoscopy. -Colonoscopy benefits and risks discussed including risk with sedation, risk of bleeding, perforation and infection  -Our office will contact your PCP/cardiologist to verify Plavix instructions prior to your colonoscopy  2.  Personal history of breast cancer  3.  Diabetes mellitus type 2  4. Sinus tachycardia followed by cardiologist Dr. Margaretann Loveless. .Echo 03/24/2018 with ejection fraction of 96%, grade I diastolic dysfunction.  -Patient was advised to follow-up with Dr.Acharya guarding persistent tachycardia with a heart rate in the low 100s.  She is asymptomatic and hemodynamically stable.  5.   CVA 2019 on Plavix. Carotid Dopplers negative for ICA stenosis  6. CKD  III         CC:  Crist Infante, MD

## 2019-10-12 NOTE — Telephone Encounter (Signed)
[facility] Pre-operative Risk Assessment     Request for surgical clearance:     Endoscopy Procedure  What type of surgery is being performed?     Colonoscopy  When is this surgery scheduled?     11/14/2019  What type of clearance is required ?   Pharmacy  Are there any medications that need to be held prior to surgery and how long? Plavix for 5 days  Practice name and name of physician performing surgery?      [facility]  What is your office phone and fax number?      Phone- [phone]  [phone]  Anesthesia type (None, local, MAC, general) ?       MAC

## 2019-10-12 NOTE — Telephone Encounter (Signed)
Indication for plavix is stroke. No cardiovascular indication for plavix.   OK to hold plavix for 5 days prior to colonoscopy if required by GI. Please restart as soon as safe from GI perspective.

## 2019-10-12 NOTE — Progress Notes (Signed)
Reviewed and agree with management plans. ? ?Dontasia Miranda L. Marga Gramajo, MD, MPH  ?

## 2019-10-12 NOTE — Telephone Encounter (Signed)
Notified the patient to stop her plavix 5 days prior to her procedure. The patient verbalized understanding,

## 2019-10-12 NOTE — Patient Instructions (Signed)
If you are age 79 or older, your body mass index should be between 23-30. Your Body mass index is 29.47 kg/m. If this is out of the aforementioned range listed, please consider follow up with your Primary Care Provider.  If you are age 98 or younger, your body mass index should be between 19-25. Your Body mass index is 29.47 kg/m. If this is out of the aformentioned range listed, please consider follow up with your Primary Care Provider.   You will be contaced by our office prior to your procedure for directions on holding your Plavix.  If you do not hear from our office 1 week prior to your scheduled procedure, please call [phone] to discuss.   You have been scheduled for a colonoscopy. Please follow written instructions given to you at your visit today.  Please pick up your prep supplies at the pharmacy within the next 1-3 days.  Follow up with your cardiologist regarding your heart rate of 104 today.  We have sent the following medications to your pharmacy for you to pick up at your convenience:  Suprep( colonoscopy prep)  Due to recent changes in healthcare laws, you may see the results of your imaging and laboratory studies on MyChart before your provider has had a chance to review them.  We understand that in some cases there may be results that are confusing or concerning to you. Not all laboratory results come back in the same time frame and the provider may be waiting for multiple results in order to interpret others.  Please give [place] 48 hours in order for your provider to thoroughly review all the results before contacting the office for clarification of your results.   Thank you for choosing [facility] Noralyn Pick, CRNP

## 2019-10-16 ENCOUNTER — Other Ambulatory Visit: Payer: Self-pay | Admitting: Nephrology

## 2019-10-16 DIAGNOSIS — N184 Chronic kidney disease, stage 4 (severe): Secondary | ICD-10-CM

## 2019-10-28 IMAGING — MR MR HEAD W/O CM
10 of 12 series · 30 of 48 positions shown · non-contrast
Comparison: None.

CLINICAL DATA: Acute onset of left-sided weakness and facial droop.
Last known well yesterday evening.

EXAM:
MRI HEAD WITHOUT CONTRAST
MRA HEAD WITHOUT CONTRAST
TECHNIQUE: Multiplanar, multiecho pulse sequences of the brain and surrounding
structures were obtained without intravenous contrast. Angiographic
images of the head were obtained using MRA technique without
contrast.

[Series 5: ax dwi_tracew · axial · 3.0mm · 1.50mm/px · z∈[-85,+54]mm · 6 of 96 slices shown]
[im 1/96]
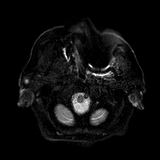
[im 20/96]
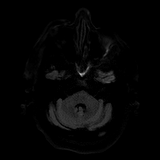
[im 39/96]
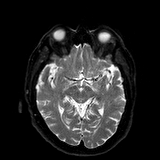
[im 58/96]
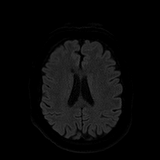
[im 77/96]
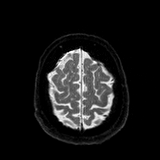
[im 96/96]
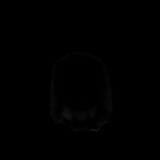

[Series 6: ax dwi_adc · axial · 3.0mm · 1.50mm/px · z∈[-85,+54]mm · 3 of 48 slices shown]
[im 1/48]
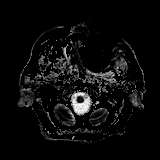
[im 24/48]
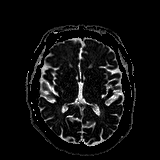
[im 48/48]
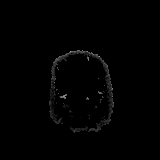

[Series 7: cor dwi_tracew · coronal · 5.0mm · 1.44mm/px · 4 of 64 slices shown]
[im 1/64]
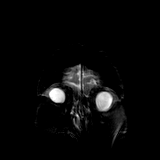
[im 22/64]
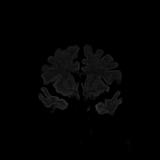
[im 43/64]
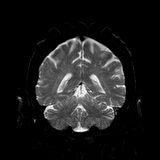
[im 64/64]
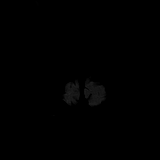

[Series 8: cor dwi_adc · coronal · 5.0mm · 1.44mm/px · 2 of 32 slices shown]
[im 1/32]
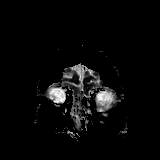
[im 32/32]
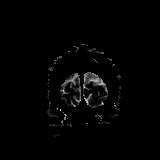

[Series 13: T1 · sagittal · 5.0mm · 0.75mm/px · 2 of 23 slices shown]
[im 1/23]
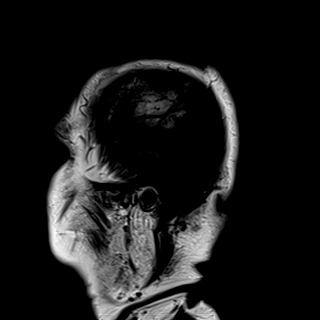
[im 23/23]
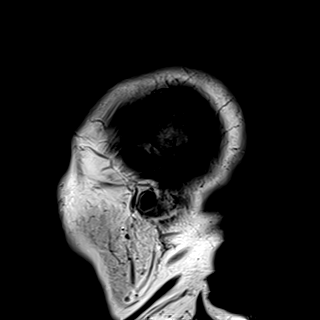

[Series 14: T2 · axial · 5.0mm · 0.72mm/px · z∈[-82,+60]mm · 2 of 25 slices shown (1 of 2)]
[im 1/25]
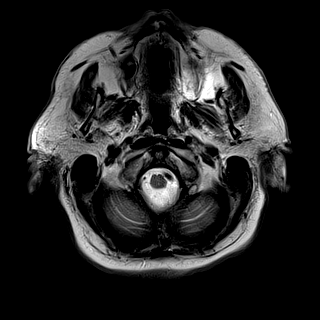
[im 25/25]
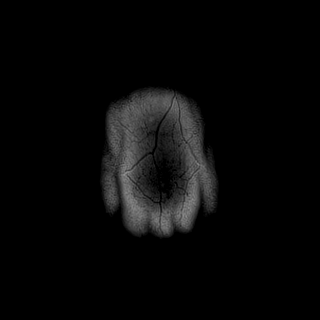

[Series 15: FLAIR · axial · 5.0mm · 0.45mm/px · z∈[-80,+62]mm · 2 of 25 slices shown]
[im 1/25]
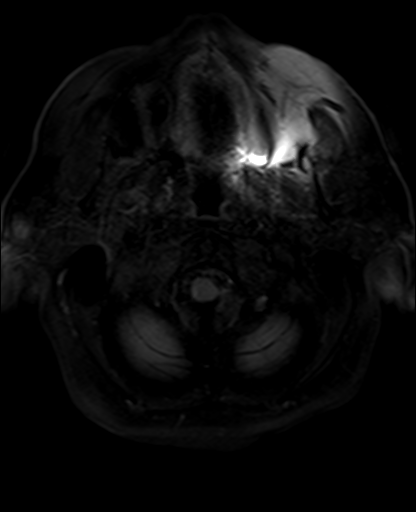
[im 25/25]
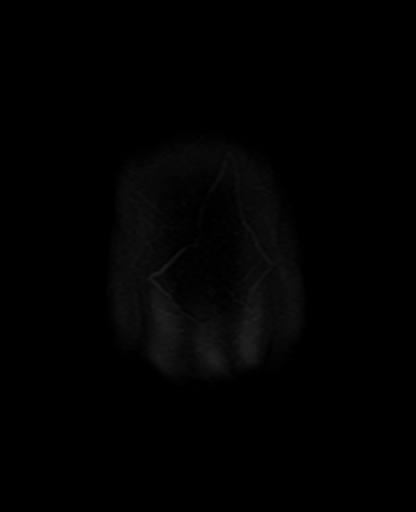

[Series 16: swi_images · axial · 3.0mm · 0.90mm/px · z∈[-90,+72]mm · 4 of 56 slices shown]
[im 1/56]
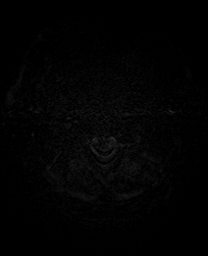
[im 19/56]
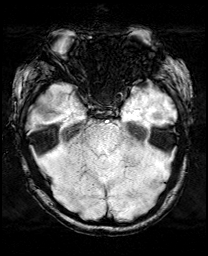
[im 37/56]
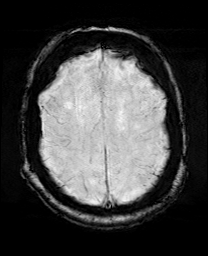
[im 56/56]
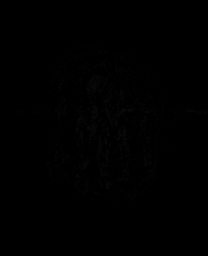

[Series 17: mip_images(sw) · axial · 24.0mm · 0.90mm/px · z∈[-80,+62]mm · 3 of 49 slices shown]
[im 1/49]
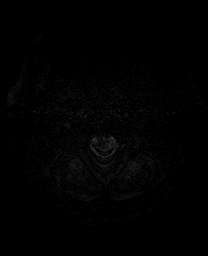
[im 25/49]
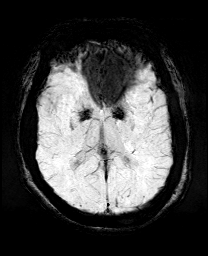
[im 49/49]
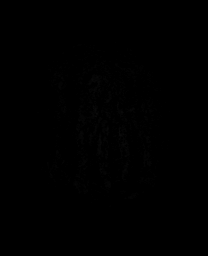

[Series 19: T2 · coronal · 5.0mm · 0.34mm/px · 2 of 29 slices shown (2 of 2)]
[im 1/29]
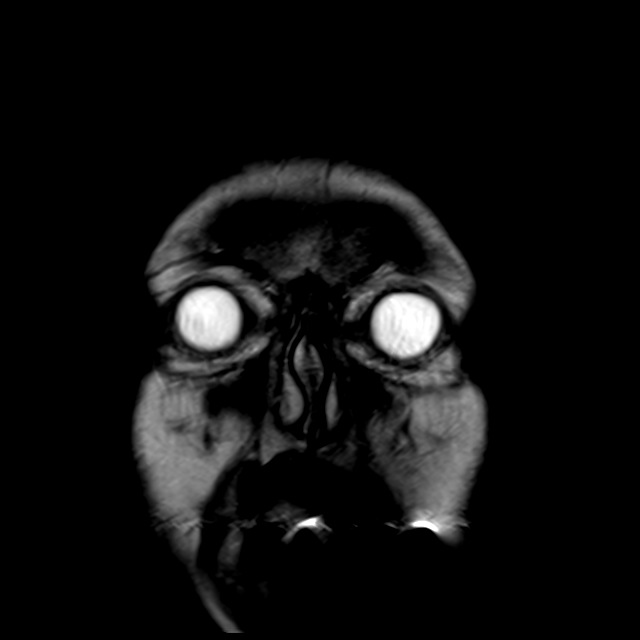
[im 29/29]
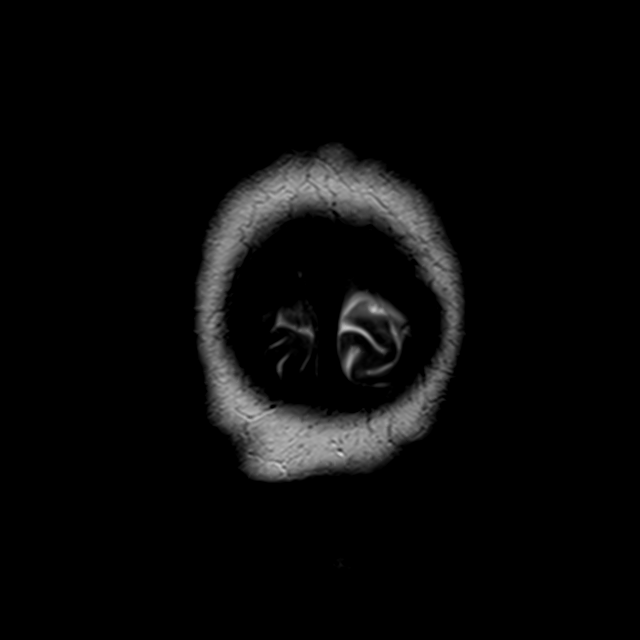

[30 of 48 positions shown; findings below may reference images not displayed]

FINDINGS: MRI HEAD FINDINGS

Brain: The diffusion-weighted images demonstrate an acute
nonhemorrhagic 12 mm linear right paramedian pontine infarct. No
other acute infarct is present. Subtle T2 signal changes are
associated with the acute/subacute infarct. Scattered subcortical T2
hyperintensities bilaterally are mildly advanced for age. Ventricles
are of normal size. No other cortical lesions are present. No
significant extra-axial fluid collection is present. The cerebellum
is within normal limits. The internal auditory canals are normal.

Vascular: Flow is present in the major intracranial arteries. The
left vertebral artery is dominant.

Skull and upper cervical spine: The skull base is within normal
limits. Craniocervical junction is within normal limits. Is present
at C4-5. Marrow signal is normal.

Sinuses/Orbits: The paranasal sinuses and mastoid air cells are
clear. Bilateral lens replacements are present. Globes and orbits
are within normal limits.

MRA HEAD FINDINGS

Internal carotid arteries demonstrate normal flow signal the high
cervical segments through the ICA termini bilaterally. The A1 and M1
segments are normal. The anterior communicating artery is patent.
MCA bifurcations are within normal limits. ACA and MCA branch
vessels are normal.

The left vertebral artery is the dominant vessel. The right
vertebral artery is hypoplastic with segmental narrowing through the
V4 segment. The right vertebral artery essentially terminates at the
PICA. The left PICA origin is visualized and normal. The basilar
artery is normal. The right posterior cerebral artery originates
from basilar tip. The left posterior cerebral artery is of fetal
type. There is some attenuation of distal PCA branch vessels
bilaterally
IMPRESSION: 1. Acute non hemorrhagic 12 mm right paramedian pontine infarct.
2. Scattered subcortical T2 hyperintensities bilaterally are mildly
advanced for age. This likely reflects the sequela of chronic
microvascular ischemia.
3. MRA circle-of-Willis demonstrates mild distal small vessel
disease without significant proximal stenosis, aneurysm, or branch
vessel occlusion.
4. Degenerative changes of the cervical spine at C4-5.

These results were called by telephone at the time of interpretation
on 03/24/2018 at [DATE] to Dr. RICHAR NAZARETH , who verbally
acknowledged these results.

## 2019-10-31 ENCOUNTER — Ambulatory Visit
Admission: RE | Admit: 2019-10-31 | Discharge: 2019-10-31 | Disposition: A | Discharge status: Home / Self Care | Payer: Medicare Other | Source: Ambulatory Visit | Attending: Nephrology | Admitting: Nephrology

## 2019-10-31 DIAGNOSIS — N184 Chronic kidney disease, stage 4 (severe): Secondary | ICD-10-CM

## 2019-11-08 ENCOUNTER — Telehealth: Payer: Self-pay | Admitting: Gastroenterology

## 2019-11-08 NOTE — Telephone Encounter (Signed)
Dr. Tarri Glenn, pt called to cx her colonoscopy scheduled 11/14/19.  She stated that she would like to discuss with her PCP and have her physical first.  She will call back to reschedule.  FYI.

## 2019-11-08 NOTE — Telephone Encounter (Signed)
Dr Perini's nurse notified.

## 2019-11-08 NOTE — Telephone Encounter (Signed)
Please notify Dr. Joylene Draft of the patient's decision. Thank you.

## 2019-11-14 ENCOUNTER — Encounter: Payer: Medicare Other | Admitting: Gastroenterology

## 2019-12-19 DIAGNOSIS — L84 Corns and callosities: Secondary | ICD-10-CM | POA: Insufficient documentation

## 2020-02-01 ENCOUNTER — Ambulatory Visit: Payer: Medicare Other | Admitting: Podiatry

## 2020-02-07 ENCOUNTER — Telehealth: Payer: Self-pay | Admitting: Internal Medicine

## 2020-02-07 NOTE — Telephone Encounter (Signed)
Attempted to contact patient to schedule f/u with Margaretann Loveless from recall list, patient didn't answer, unable to LVM, no VM set up

## 2020-03-05 ENCOUNTER — Ambulatory Visit: Payer: Medicare Other | Admitting: Podiatry

## 2020-03-05 ENCOUNTER — Other Ambulatory Visit: Payer: Self-pay

## 2020-03-05 DIAGNOSIS — M79674 Pain in right toe(s): Secondary | ICD-10-CM | POA: Diagnosis not present

## 2020-03-05 DIAGNOSIS — B351 Tinea unguium: Secondary | ICD-10-CM | POA: Diagnosis not present

## 2020-03-05 DIAGNOSIS — E119 Type 2 diabetes mellitus without complications: Secondary | ICD-10-CM | POA: Diagnosis not present

## 2020-03-05 DIAGNOSIS — M2041 Other hammer toe(s) (acquired), right foot: Secondary | ICD-10-CM | POA: Diagnosis not present

## 2020-03-05 DIAGNOSIS — M79675 Pain in left toe(s): Secondary | ICD-10-CM | POA: Diagnosis not present

## 2020-03-05 DIAGNOSIS — M2042 Other hammer toe(s) (acquired), left foot: Secondary | ICD-10-CM

## 2020-03-06 ENCOUNTER — Encounter: Payer: Self-pay | Admitting: Podiatry

## 2020-03-06 ENCOUNTER — Encounter: Payer: Self-pay | Admitting: General Practice

## 2020-03-06 NOTE — Progress Notes (Signed)
°  Subjective:  Patient ID: [name], female    DOB: [birth year],  MRN: [id number]  Chief Complaint  Patient presents with   Nail Problem    Thick, long toenails. Requests nail trim.   Callouses    R plantar forefoot submet 2 and L hallux.   Diabetes    Unsure of recent A1c results. Pt stated, "I've had diabetes for about a year. I had it before, but it resolved. No neuropathy or ulcers".   79 y.o. female returns for the above complaint.  Patient presents with thickened elongated dystrophic toenails x10.  Patient would like to have them debrided down.  She states that she has not been able to do it herself.  She is a diabetic with last A1c of 8.6.  Patient is also on Plavix as well.  She denies any other acute complaints.  She would like to also think about getting diabetic shoes as well.  Objective:  There were no vitals filed for this visit. Podiatric Exam: Vascular: dorsalis pedis and posterior tibial pulses are palpable bilateral. Capillary return is immediate. Temperature gradient is WNL. Skin turgor WNL  Sensorium: Diminished Semmes Weinstein monofilament test. Normal tactile sensation bilaterally. Nail Exam: Pt has thick disfigured discolored nails with subungual debris noted bilateral entire nail hallux through fifth toenails.  Pain on palpation to the nails. Ulcer Exam: There is no evidence of ulcer or pre-ulcerative changes or infection. Orthopedic Exam: Muscle tone and strength are WNL. No limitations in general ROM. No crepitus or effusions noted. HAV  B/L.  Hammer toes 2-5  B/L. Skin: No Porokeratosis. No infection or ulcers    Assessment & Plan:   1. Diabetes mellitus type 2 in nonobese (HCC)   2. Pain due to onychomycosis of toenails of both feet   3. Hammer toes of both feet     Patient was evaluated and treated and all questions answered.  Hammertoe contractures 2 through 5 bilateral -I explained to patient the etiology of hammertoe contractures various  treatment options were extensively discussed.  Given that there is contracture in the setting of uncontrolled A1c and beginning signs of neuropathy I believe she will benefit from diabetic shoes.  Patient agrees with plan would like to obtain diabetic shoes. -Should be scheduled to see Liliane Channel for diabetic shoes  Onychomycosis with pain  -Nails palliatively debrided as below. -Educated on self-care  Procedure: Nail Debridement Rationale: pain  Type of Debridement: manual, sharp debridement. Instrumentation: Nail nipper, rotary burr. Number of Nails: 10  Procedures and Treatment: Consent by patient was obtained for treatment procedures. The patient understood the discussion of treatment and procedures well. All questions were answered thoroughly reviewed. Debridement of mycotic and hypertrophic toenails, 1 through 5 bilateral and clearing of subungual debris. No ulceration, no infection noted.  Return Visit-Office Procedure: Patient instructed to return to the office for a follow up visit 3 months for continued evaluation and treatment.  Boneta Lucks, DPM    Return in about 3 months (around 06/05/2020), or See Dr. Posey Pronto, for See Liliane Channel for Diabetic shoes ASAP.

## 2020-03-19 ENCOUNTER — Ambulatory Visit: Payer: Medicare Other | Admitting: Orthotics

## 2020-03-19 ENCOUNTER — Other Ambulatory Visit: Payer: Self-pay

## 2020-03-19 DIAGNOSIS — M2042 Other hammer toe(s) (acquired), left foot: Secondary | ICD-10-CM

## 2020-03-19 DIAGNOSIS — M2041 Other hammer toe(s) (acquired), right foot: Secondary | ICD-10-CM

## 2020-03-19 DIAGNOSIS — E119 Type 2 diabetes mellitus without complications: Secondary | ICD-10-CM

## 2020-03-19 NOTE — Progress Notes (Signed)

## 2020-04-17 ENCOUNTER — Other Ambulatory Visit: Payer: Medicare Other | Admitting: Orthotics

## 2020-04-23 ENCOUNTER — Emergency Department (HOSPITAL_COMMUNITY)
Admission: EM | Admit: 2020-04-23 | Discharge: 2020-04-24 | Discharge status: Against Medical Advice | Payer: Medicare Other

## 2020-04-23 NOTE — ED Notes (Signed)
Pt stated they would see their PCP tomorrow and did not want to be triaged.

## 2020-04-25 ENCOUNTER — Emergency Department (HOSPITAL_COMMUNITY): Payer: Medicare Other

## 2020-04-25 ENCOUNTER — Inpatient Hospital Stay (HOSPITAL_COMMUNITY)
Admission: EM | Admit: 2020-04-25 | Discharge: 2020-05-05 | DRG: 682 | Disposition: A | Discharge status: Skilled Nursing Facility | Payer: Medicare Other | Attending: Internal Medicine | Admitting: Internal Medicine

## 2020-04-25 DIAGNOSIS — N183 Chronic kidney disease, stage 3 unspecified: Secondary | ICD-10-CM

## 2020-04-25 DIAGNOSIS — E1165 Type 2 diabetes mellitus with hyperglycemia: Secondary | ICD-10-CM | POA: Diagnosis present

## 2020-04-25 DIAGNOSIS — I493 Ventricular premature depolarization: Secondary | ICD-10-CM | POA: Diagnosis present

## 2020-04-25 DIAGNOSIS — I77810 Thoracic aortic ectasia: Secondary | ICD-10-CM | POA: Diagnosis present

## 2020-04-25 DIAGNOSIS — R140 Abdominal distension (gaseous): Secondary | ICD-10-CM

## 2020-04-25 DIAGNOSIS — N281 Cyst of kidney, acquired: Secondary | ICD-10-CM

## 2020-04-25 DIAGNOSIS — Z20822 Contact with and (suspected) exposure to covid-19: Secondary | ICD-10-CM | POA: Diagnosis present

## 2020-04-25 DIAGNOSIS — Z7902 Long term (current) use of antithrombotics/antiplatelets: Secondary | ICD-10-CM

## 2020-04-25 DIAGNOSIS — E860 Dehydration: Secondary | ICD-10-CM | POA: Diagnosis present

## 2020-04-25 DIAGNOSIS — J811 Chronic pulmonary edema: Secondary | ICD-10-CM

## 2020-04-25 DIAGNOSIS — E785 Hyperlipidemia, unspecified: Secondary | ICD-10-CM | POA: Diagnosis present

## 2020-04-25 DIAGNOSIS — Z6833 Body mass index (BMI) 33.0-33.9, adult: Secondary | ICD-10-CM

## 2020-04-25 DIAGNOSIS — R000 Tachycardia, unspecified: Secondary | ICD-10-CM

## 2020-04-25 DIAGNOSIS — G9341 Metabolic encephalopathy: Secondary | ICD-10-CM | POA: Diagnosis present

## 2020-04-25 DIAGNOSIS — K8020 Calculus of gallbladder without cholecystitis without obstruction: Secondary | ICD-10-CM | POA: Diagnosis present

## 2020-04-25 DIAGNOSIS — Z8673 Personal history of transient ischemic attack (TIA), and cerebral infarction without residual deficits: Secondary | ICD-10-CM

## 2020-04-25 DIAGNOSIS — I959 Hypotension, unspecified: Secondary | ICD-10-CM | POA: Diagnosis not present

## 2020-04-25 DIAGNOSIS — D649 Anemia, unspecified: Secondary | ICD-10-CM

## 2020-04-25 DIAGNOSIS — D631 Anemia in chronic kidney disease: Secondary | ICD-10-CM | POA: Diagnosis present

## 2020-04-25 DIAGNOSIS — E119 Type 2 diabetes mellitus without complications: Secondary | ICD-10-CM

## 2020-04-25 DIAGNOSIS — N170 Acute kidney failure with tubular necrosis: Principal | ICD-10-CM | POA: Diagnosis present

## 2020-04-25 DIAGNOSIS — Z794 Long term (current) use of insulin: Secondary | ICD-10-CM

## 2020-04-25 DIAGNOSIS — I129 Hypertensive chronic kidney disease with stage 1 through stage 4 chronic kidney disease, or unspecified chronic kidney disease: Secondary | ICD-10-CM | POA: Diagnosis present

## 2020-04-25 DIAGNOSIS — E041 Nontoxic single thyroid nodule: Secondary | ICD-10-CM | POA: Diagnosis present

## 2020-04-25 DIAGNOSIS — N179 Acute kidney failure, unspecified: Secondary | ICD-10-CM | POA: Diagnosis not present

## 2020-04-25 DIAGNOSIS — E1169 Type 2 diabetes mellitus with other specified complication: Secondary | ICD-10-CM | POA: Diagnosis present

## 2020-04-25 DIAGNOSIS — E876 Hypokalemia: Secondary | ICD-10-CM | POA: Diagnosis present

## 2020-04-25 DIAGNOSIS — Z85038 Personal history of other malignant neoplasm of large intestine: Secondary | ICD-10-CM

## 2020-04-25 DIAGNOSIS — Z923 Personal history of irradiation: Secondary | ICD-10-CM

## 2020-04-25 DIAGNOSIS — Z90710 Acquired absence of both cervix and uterus: Secondary | ICD-10-CM

## 2020-04-25 DIAGNOSIS — I472 Ventricular tachycardia: Secondary | ICD-10-CM | POA: Diagnosis present

## 2020-04-25 DIAGNOSIS — E861 Hypovolemia: Secondary | ICD-10-CM | POA: Diagnosis present

## 2020-04-25 DIAGNOSIS — N189 Chronic kidney disease, unspecified: Secondary | ICD-10-CM | POA: Diagnosis present

## 2020-04-25 DIAGNOSIS — I152 Hypertension secondary to endocrine disorders: Secondary | ICD-10-CM | POA: Diagnosis present

## 2020-04-25 DIAGNOSIS — R52 Pain, unspecified: Secondary | ICD-10-CM

## 2020-04-25 DIAGNOSIS — N2581 Secondary hyperparathyroidism of renal origin: Secondary | ICD-10-CM | POA: Diagnosis present

## 2020-04-25 DIAGNOSIS — Z79899 Other long term (current) drug therapy: Secondary | ICD-10-CM

## 2020-04-25 DIAGNOSIS — I712 Thoracic aortic aneurysm, without rupture: Secondary | ICD-10-CM | POA: Diagnosis present

## 2020-04-25 DIAGNOSIS — E872 Acidosis: Secondary | ICD-10-CM | POA: Diagnosis present

## 2020-04-25 DIAGNOSIS — Z23 Encounter for immunization: Secondary | ICD-10-CM

## 2020-04-25 DIAGNOSIS — R6510 Systemic inflammatory response syndrome (SIRS) of non-infectious origin without acute organ dysfunction: Secondary | ICD-10-CM | POA: Diagnosis present

## 2020-04-25 DIAGNOSIS — G9340 Encephalopathy, unspecified: Secondary | ICD-10-CM | POA: Diagnosis present

## 2020-04-25 DIAGNOSIS — E1122 Type 2 diabetes mellitus with diabetic chronic kidney disease: Secondary | ICD-10-CM | POA: Diagnosis present

## 2020-04-25 DIAGNOSIS — N2889 Other specified disorders of kidney and ureter: Secondary | ICD-10-CM | POA: Diagnosis present

## 2020-04-25 DIAGNOSIS — E1159 Type 2 diabetes mellitus with other circulatory complications: Secondary | ICD-10-CM | POA: Diagnosis present

## 2020-04-25 DIAGNOSIS — D72829 Elevated white blood cell count, unspecified: Secondary | ICD-10-CM | POA: Diagnosis present

## 2020-04-25 DIAGNOSIS — Z853 Personal history of malignant neoplasm of breast: Secondary | ICD-10-CM

## 2020-04-25 DIAGNOSIS — N184 Chronic kidney disease, stage 4 (severe): Secondary | ICD-10-CM | POA: Diagnosis present

## 2020-04-25 DIAGNOSIS — F418 Other specified anxiety disorders: Secondary | ICD-10-CM | POA: Diagnosis present

## 2020-04-25 HISTORY — DX: Disorder of arteries and arterioles, unspecified: I77.9

## 2020-04-25 LAB — URINALYSIS, ROUTINE W REFLEX MICROSCOPIC
Bilirubin Urine: NEGATIVE
Glucose, UA: NEGATIVE mg/dL
Hgb urine dipstick: NEGATIVE
Ketones, ur: NEGATIVE mg/dL
Leukocytes,Ua: NEGATIVE
Nitrite: NEGATIVE
Protein, ur: 100 mg/dL — AB
Specific Gravity, Urine: 1.015 (ref 1.005–1.030)
pH: 5 (ref 5.0–8.0)

## 2020-04-25 LAB — COMPREHENSIVE METABOLIC PANEL
ALT: 49 U/L — ABNORMAL HIGH (ref 0–44)
AST: 65 U/L — ABNORMAL HIGH (ref 15–41)
Albumin: 2.7 g/dL — ABNORMAL LOW (ref 3.5–5.0)
Alkaline Phosphatase: 123 U/L (ref 38–126)
Anion gap: 19 — ABNORMAL HIGH (ref 5–15)
BUN: 93 mg/dL — ABNORMAL HIGH (ref 8–23)
CO2: 13 mmol/L — ABNORMAL LOW (ref 22–32)
Calcium: 9.3 mg/dL (ref 8.9–10.3)
Chloride: 103 mmol/L (ref 98–111)
Creatinine, Ser: 5.52 mg/dL — ABNORMAL HIGH (ref 0.44–1.00)
GFR calc Af Amer: 8 mL/min — ABNORMAL LOW (ref >60)
GFR calc non Af Amer: 7 mL/min — ABNORMAL LOW (ref >60)
Glucose, Bld: 126 mg/dL — ABNORMAL HIGH (ref 70–99)
Potassium: 4.3 mmol/L (ref 3.5–5.1)
Sodium: 135 mmol/L (ref 135–145)
Total Bilirubin: 1.3 mg/dL — ABNORMAL HIGH (ref 0.3–1.2)
Total Protein: 6.7 g/dL (ref 6.5–8.1)

## 2020-04-25 LAB — CBC WITH DIFFERENTIAL/PLATELET
Abs Immature Granulocytes: 0.11 10*3/uL — ABNORMAL HIGH (ref 0.00–0.07)
Basophils Absolute: 0.1 10*3/uL (ref 0.0–0.1)
Basophils Relative: 0 %
Eosinophils Absolute: 0.1 10*3/uL (ref 0.0–0.5)
Eosinophils Relative: 1 %
HCT: 29.3 % — ABNORMAL LOW (ref 36.0–46.0)
Hemoglobin: 9.5 g/dL — ABNORMAL LOW (ref 12.0–15.0)
Immature Granulocytes: 1 %
Lymphocytes Relative: 4 %
Lymphs Abs: 0.7 10*3/uL (ref 0.7–4.0)
MCH: 32.3 pg (ref 26.0–34.0)
MCHC: 32.4 g/dL (ref 30.0–36.0)
MCV: 99.7 fL (ref 80.0–100.0)
Monocytes Absolute: 1 10*3/uL (ref 0.1–1.0)
Monocytes Relative: 6 %
Neutro Abs: 14.5 10*3/uL — ABNORMAL HIGH (ref 1.7–7.7)
Neutrophils Relative %: 88 %
Platelets: 293 10*3/uL (ref 150–400)
RBC: 2.94 MIL/uL — ABNORMAL LOW (ref 3.87–5.11)
RDW: 13.2 % (ref 11.5–15.5)
WBC: 16.5 10*3/uL — ABNORMAL HIGH (ref 4.0–10.5)
nRBC: 0 % (ref 0.0–0.2)

## 2020-04-25 LAB — POC OCCULT BLOOD, ED: Fecal Occult Bld: NEGATIVE

## 2020-04-25 LAB — RESPIRATORY PANEL BY RT PCR (FLU A&B, COVID)
Influenza A by PCR: NEGATIVE
Influenza B by PCR: NEGATIVE
SARS Coronavirus 2 by RT PCR: NEGATIVE

## 2020-04-25 LAB — CBG MONITORING, ED: Glucose-Capillary: 105 mg/dL — ABNORMAL HIGH (ref 70–99)

## 2020-04-25 LAB — LACTIC ACID, PLASMA: Lactic Acid, Venous: 1.2 mmol/L (ref 0.5–1.9)

## 2020-04-25 MED ORDER — ACETAMINOPHEN 650 MG RE SUPP: 650.0000 mg | Freq: Four times a day (QID) | RECTAL | Status: DC | PRN

## 2020-04-25 MED ORDER — ONDANSETRON HCL 4 MG/2ML IJ SOLN: 4.0000 mg | Freq: Four times a day (QID) | INTRAMUSCULAR | Status: DC | PRN

## 2020-04-25 MED ORDER — HEPARIN SODIUM (PORCINE) 5000 UNIT/ML IJ SOLN
5000.0000 [IU] | Freq: Three times a day (TID) | INTRAMUSCULAR | Status: DC
  Administered 2020-04-26 – 2020-05-05 (×29): 5000 [IU] via SUBCUTANEOUS
  Filled 2020-04-25 (×28): qty 1

## 2020-04-25 MED ORDER — ACETAMINOPHEN 500 MG PO TABS
1000.0000 mg | ORAL_TABLET | Freq: Once | ORAL | Status: AC
  Administered 2020-04-25: 1000 mg via ORAL
  Filled 2020-04-25: qty 2

## 2020-04-25 MED ORDER — ONDANSETRON HCL 4 MG PO TABS: 4.0000 mg | ORAL_TABLET | Freq: Four times a day (QID) | ORAL | Status: DC | PRN

## 2020-04-25 MED ORDER — SODIUM CHLORIDE 0.9 % IV BOLUS
1000.0000 mL | Freq: Once | INTRAVENOUS | Status: AC
  Administered 2020-04-25: 1000 mL via INTRAVENOUS

## 2020-04-25 MED ORDER — ACETAMINOPHEN 325 MG PO TABS
650.0000 mg | ORAL_TABLET | Freq: Four times a day (QID) | ORAL | Status: DC | PRN
  Administered 2020-04-30: 650 mg via ORAL
  Filled 2020-04-25: qty 2

## 2020-04-25 MED ORDER — LACTATED RINGERS IV SOLN: INTRAVENOUS | Status: DC

## 2020-04-25 NOTE — ED Triage Notes (Signed)
Pt presents from home where she lives alone (independent, drives) for AMS starting Sunday, see well Saturday by family. Sunday noted that she had not taken several medications, seen by PCP today who recommended transport to ED for hypotension, N/V ( none seen by EMS 110/60, but SBP70s for PCP per son). Alert and oriented to self only (A&Ox4 at baseline) .  H/o CVA (no deficits), CHF, DM, CKD, colon CA (2015)  EMS exam: abd distention, no motor neuro deficits, A&Ox1. Pt unable to provide information about last BM, output. Denies pain

## 2020-04-25 NOTE — H&P (Signed)
History and Physical    [name] RVU:[id number] DOB: 11-03-1940 DOA: 04/25/2020  PCP: Crist Infante, MD  Patient coming from: Home via EMS  I have personally briefly reviewed patient's old medical records in [place]  Chief Complaint: Altered mental status  HPI: [name] is a 79 y.o. female with medical history significant for type 2 diabetes, HTN, HLD, CKD stage IV, history of CVA, and depression/anxiety who presents to the ED for evaluation of altered mental status. Patient currently lives alone. She was last seen normal by family on 9/25. She was noticed to have some confusion on 9/26. Patient states she has been having generalized weakness and lightheadedness without syncope or fall. She says had nausea and vomiting and had not been taking her home medications. She reports new loose stools recently without hematochezia or melena. She has not had any chest pain, dyspnea, abdominal pain, or dysuria.  She was seen by her PCP earlier today 10/1 and was noted to be hypotensive with SBP 70s per ED triage documentation. EMS were called and she was brought to the hospital for further evaluation.  ED Course:  Initial vitals showed BP 121/46, pulse 130, RR 18, temp 99.7 Fahrenheit, SPO2 96% on room air.  Labs show WBC 16.5, hemoglobin 9.5, platelets 293,000, sodium 135, potassium 4.3, bicarb 13, BUN 93, creatinine 5.52 (previously 1.97 on 04/10/2018), AST 65, ALT 49, alk phos 123, total bilirubin 1.3, serum glucose 126, lactic acid 1.2.  Urinalysis shows negative nitrates, negative leukocytes, 0-5 RBCs and WBC/hpf, rare bacteria microscopy.  Blood and urine cultures were obtained and pending.  SARS-CoV-2 PCR is negative.  Influenza A/B are negative.  FOBT is negative.  2 view chest x-ray showed prominent cardiomediastinal silhouette with mild pulmonary edema and bibasilar hazy opacities likely representing atelectasis.  CT head without contrast was negative for acute  intracranial abnormality.  CT chest without contrast showed scattered subsegmental atelectasis in the lung bases without evidence of pneumonia.  Fusiform dilatation of the ascending aorta maximal dimension 4 cm is noted.  Questionable edema in the gallbladder fossa also seen.  Left thyroid nodule measuring 2.4 cm also reported.  RUQ ultrasound shows cholelithiasis and mild amount of pericholecystic fluid without evidence of acute cholecystitis.  Subcentimeter gallbladder polyp noted.  Complex right renal mass also seen.  Patient was given 2 L normal saline and Tylenol.  The hospitalist service was consulted to admit for further evaluation and management.  Review of Systems: All systems reviewed and are negative except as documented in history of present illness above.   Past Medical History:  Diagnosis Date  . Anxiety   . Back ache   . Breast cancer ([place])    right  . Bronchitis   . Cancer of colon ([place])   . Depression   . DM (diabetes mellitus) ([place])   . Dyspnea on exertion   . Fall on steps    age 43 feel down iron stairs couldnot move leg for 6 mths  . Family history of primary TB   . Fracture of ankle, closed 2014   left  . History of chicken pox   . History of hysterectomy 04/1980  . History of primary TB   . Hyperlipidemia   . Hypertension   . Kidney disease, chronic, stage III (moderate, EGFR 30-59 ml/min) 2017  . Kidney disorder    reduction function  . Kidney stones   . Knee pain, right   . Lung abnormality    age 65 patient  had pna spot on lungs   . Microalbuminuria   . Muscle cramps    both legs  . Muscular degeneration    of right eye  . Nephrolithiasis   . Palpitations   . Personal history of radiation therapy   . Stroke [facility])     Past Surgical History:  Procedure Laterality Date  . ABLATION  2007   fast heart rate  . ANKLE FRACTURE SURGERY Left 2014  . BREAST LUMPECTOMY Right   . CATARACT EXTRACTION Right 2017  . colonscopy  2015  . HYSTEROTOMY       45 years ago  . TONSILLECTOMY AND ADENOIDECTOMY     age 4    Social History:  reports that she has never smoked. She has never used smokeless tobacco. She reports that she does not drink alcohol and does not use drugs.  No Known Allergies  Family History  Problem Relation Age of Onset  . Peripheral Artery Disease Mother 50       HX OF POOR CIRCULATION, SMOKING, LEG AMPUTATION  . Cancer Father        cancer of spine  . Asthma Brother 34  . Kidney Stones Brother   . Breast cancer Neg Hx   . Colon cancer Neg Hx   . Colon polyps Neg Hx      Prior to Admission medications   Medication Sig Start Date End Date Taking? Authorizing Provider  acetaminophen (TYLENOL) 325 MG tablet Take 2 tablets (650 mg total) by mouth every 4 (four) hours as needed for mild pain (or temp > 37.5 C (99.5 F)). 04/12/18   Angiulli, Lavon Paganini, PA-C  ARIPiprazole (ABILIFY) 5 MG tablet Take 1 tablet (5 mg total) by mouth daily. 04/12/18   Angiulli, Lavon Paganini, PA-C  atorvastatin (LIPITOR) 80 MG tablet Take 1 tablet (80 mg total) by mouth daily at 6 PM. 04/12/18   Angiulli, Lavon Paganini, PA-C  BD PEN NEEDLE NANO 2ND GEN 32G X 4 MM MISC USE WITH TRESIBA DAILY 07/24/19   [provider]  clopidogrel (PLAVIX) 75 MG tablet Take 1 tablet (75 mg total) by mouth daily. 04/12/18   Angiulli, Lavon Paganini, PA-C  docusate sodium (COLACE) 100 MG capsule Take 100 mg by mouth 2 (two) times daily.    [provider]  furosemide (LASIX) 20 MG tablet Take 20 mg by mouth every morning.  08/30/19   [provider]  insulin lispro protamine-lispro (HUMALOG 75/25 MIX) (75-25) 100 UNIT/ML SUSP injection Inject 45 Units into the skin 2 (two) times daily with a meal.    [provider]  lisinopril (PRINIVIL,ZESTRIL) 10 MG tablet Take 10 mg by mouth daily.    [provider]  LORazepam (ATIVAN) 0.5 MG tablet Take 1 tablet (0.5 mg total) by mouth 4 (four) times daily. 04/12/18   Angiulli, Lavon Paganini, PA-C   mirtazapine (REMERON) 15 MG tablet Take 1 tablet (15 mg total) by mouth at bedtime. 04/12/18   Angiulli, Lavon Paganini, PA-C  Multiple Vitamin (MULTIVITAMIN) capsule Take 1 capsule by mouth daily.    [provider]  multivitamin-lutein (OCUVITE-LUTEIN) CAPS capsule Take 2 capsules by mouth daily.    [provider]  omega-3 acid ethyl esters (LOVAZA) 1 g capsule Take 1 capsule (1 g total) by mouth 2 (two) times daily. Patient taking differently: Take 2 g by mouth 2 (two) times daily.  04/12/18   Angiulli, Lavon Paganini, PA-C  ONETOUCH VERIO test strip USE 1 STRIP TO TEST BLOOD  GLUCOSE TWICE DAILY DIAG CODE E11.9 08/16/18   [provider]  sennosides-docusate sodium (SENOKOT-S) 8.6-50 MG tablet Take 1 tablet by mouth daily.    [provider]  venlafaxine XR (EFFEXOR-XR) 150 MG 24 hr capsule Take 1 capsule by mouth daily. 05/12/18   [provider]    Physical Exam: Vitals:   04/25/20 1945 04/25/20 2145 04/25/20 2200 04/25/20 2245  BP:      Pulse: (!) 104 (!) 101 (!) 103 (!) 118  Resp: (!) 22 (!) 22 18 (!) 21  Temp:      TempSrc:      SpO2: 99% 96% 96% 97%   Constitutional: Obese woman resting supine in bed, NAD, calm, comfortable Eyes: PERRL, lids and conjunctivae normal ENMT: Mucous membranes are dry. Posterior pharynx clear of any exudate or lesions.Normal dentition.  Neck: normal, supple, no masses. Respiratory: clear to auscultation bilaterally, no wheezing, no crackles. Normal respiratory effort. No accessory muscle use.  Cardiovascular: Tachycardic with regular rhythm, no murmurs / rubs / gallops. No extremity edema. 2+ pedal pulses. Abdomen: Slightly distended nontense abdomen, no tenderness, no masses palpated. No hepatosplenomegaly. Bowel sounds positive.  Musculoskeletal: no clubbing / cyanosis. No joint deformity upper and lower extremities. Good ROM, no contractures. Normal muscle tone.  Skin: no rashes, lesions, ulcers. No  induration Neurologic: CN 2-12 grossly intact. Sensation intact, Strength 5/5 in all 4.  Psychiatric: Normal judgment and insight. Alert and oriented x 3. Normal mood. Some tangential speech.  Labs on Admission: I have personally reviewed following labs and imaging studies  CBC: Recent Labs  Lab 04/25/20 1639  WBC 16.5*  NEUTROABS 14.5*  HGB 9.5*  HCT 29.3*  MCV 99.7  PLT 546   Basic Metabolic Panel: Recent Labs  Lab 04/25/20 1639  NA 135  K 4.3  CL 103  CO2 13*  GLUCOSE 126*  BUN 93*  CREATININE 5.52*  CALCIUM 9.3   GFR: CrCl cannot be calculated (Unknown ideal weight.). Liver Function Tests: Recent Labs  Lab 04/25/20 1639  AST 65*  ALT 49*  ALKPHOS 123  BILITOT 1.3*  PROT 6.7  ALBUMIN 2.7*   No results for input(s): LIPASE, AMYLASE in the last 168 hours. No results for input(s): AMMONIA in the last 168 hours. Coagulation Profile: No results for input(s): INR, PROTIME in the last 168 hours. Cardiac Enzymes: No results for input(s): CKTOTAL, CKMB, CKMBINDEX, TROPONINI in the last 168 hours. BNP (last 3 results) No results for input(s): PROBNP in the last 8760 hours. HbA1C: No results for input(s): HGBA1C in the last 72 hours. CBG: Recent Labs  Lab 04/25/20 1654  GLUCAP 105*   Lipid Profile: No results for input(s): CHOL, HDL, LDLCALC, TRIG, CHOLHDL, LDLDIRECT in the last 72 hours. Thyroid Function Tests: No results for input(s): TSH, T4TOTAL, FREET4, T3FREE, THYROIDAB in the last 72 hours. Anemia Panel: No results for input(s): VITAMINB12, FOLATE, FERRITIN, TIBC, IRON, RETICCTPCT in the last 72 hours. Urine analysis:    Component Value Date/Time   COLORURINE AMBER (A) 04/25/2020 2212   APPEARANCEUR CLOUDY (A) 04/25/2020 2212   LABSPEC 1.015 04/25/2020 2212   PHURINE 5.0 04/25/2020 2212   GLUCOSEU NEGATIVE 04/25/2020 2212   HGBUR NEGATIVE 04/25/2020 2212   BILIRUBINUR NEGATIVE 04/25/2020 2212   KETONESUR NEGATIVE 04/25/2020 2212   PROTEINUR  100 (A) 04/25/2020 2212   NITRITE NEGATIVE 04/25/2020 2212   LEUKOCYTESUR NEGATIVE 04/25/2020 2212    Radiological Exams on Admission: DG Chest 2 View  Addendum Date: 04/25/2020  ADDENDUM REPORT: 04/25/2020 17:44 ADDENDUM: These results were called by telephone at the time of interpretation on 04/25/2020 at 5:36pm to provider JULIE HAVILAND , who verbally acknowledged these results. Electronically Signed   By: Iven Finn M.D.   On: 04/25/2020 17:44   Result Date: 04/25/2020 CLINICAL DATA:  Altered mental status.  Tachycardic. EXAM: CHEST - 2 VIEW.  Sitting upright AP. COMPARISON:  None. FINDINGS: Prominent cardiomediastinal silhouette with suggestion of a widened mediastinum on AP view without patient rotation. Aortic arch calcifications. Bibasilar hazy airspace opacities. Increased interstitial markings. No pulmonary edema. No pleural effusion. No pneumothorax. No acute osseous abnormality. IMPRESSION: 1. Prominent cardiomediastinal silhouette with mild pulmonary edema. 2. Bibasilar hazy airspace opacities likely represent atelectasis. Overlying infection/inflammation not excluded. Electronically Signed: By: Iven Finn M.D. On: 04/25/2020 17:32   CT Head Wo Contrast  Result Date: 04/25/2020 CLINICAL DATA:  Mental status change, unknown cause EXAM: CT HEAD WITHOUT CONTRAST TECHNIQUE: Contiguous axial images were obtained from the base of the skull through the vertex without intravenous contrast. COMPARISON:  CT head 03/24/18. FINDINGS: Brain: Patchy and confluent areas of decreased attenuation are noted throughout the deep and periventricular white matter of the cerebral hemispheres bilaterally, compatible with chronic microvascular ischemic disease. No evidence of acute infarction, hemorrhage, hydrocephalus, extra-axial collection or mass lesion/mass effect. Vascular: No hyperdense vessel or unexpected calcification. Skull: Negative for fracture or focal lesion. Sinuses/Orbits: No acute  finding.  Bilateral lens replacement. Other: None. IMPRESSION: No acute intracranial abnormality. Electronically Signed   By: Iven Finn M.D.   On: 04/25/2020 17:44   CT Chest Wo Contrast  Result Date: 04/25/2020 CLINICAL DATA:  Respiratory illness, nondiagnostic xray Hypotension with nausea and vomiting. EXAM: CT CHEST WITHOUT CONTRAST TECHNIQUE: Multidetector CT imaging of the chest was performed following the standard protocol without IV contrast. COMPARISON:  Chest radiograph earlier this day. No remote imaging available. FINDINGS: Cardiovascular: Ectatic ascending aorta at 4 cm. Mild diffuse aortic atherosclerosis. The descending aorta is tortuous. There is no periaortic stranding. Common origin of the brachiocephalic and left common carotid artery, variant arch anatomy. Left vertebral artery also arises from the aortic arch. Borderline cardiomegaly. There are coronary artery calcifications. No significant pericardial effusion. Mediastinum/Nodes: There is a 2.4 cm hypodense left thyroid nodule. No enlarged mediastinal lymph nodes. There is no bulky hilar adenopathy. No esophageal wall thickening. Lungs/Pleura: Breathing motion artifact in the lung bases. Dependent lower lobe atelectasis, with additional scattered subsegmental atelectasis throughout both lungs. No septal thickening or ground-glass opacity to suggest pulmonary edema. No pulmonary mass or dominant pulmonary nodule, motion limited assessment. No significant pleural effusion. Upper Abdomen: Questionable edema in the gallbladder fossa included on the last image in the field of view. Liquid stool in the included colon. Musculoskeletal: There are no acute or suspicious osseous abnormalities. IMPRESSION: 1. Breathing motion artifact in the lung bases with scattered subsegmental atelectasis. No evidence of pneumonia. 2. 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 Aortic atherosclerosis. Recommend annual imaging followup by  CTA or MRA. This recommendation follows 2010 ACCF/AHA/AATS/ACR/ASA/SCA/SCAI/SIR/STS/SVM Guidelines for the Diagnosis and Management of Patients with Thoracic Aortic Disease. Circulation. 2010; 121: P536-R443. Aortic aneurysm NOS (ICD10-I71.9) 3. Questionable edema in the gallbladder fossa included on the last image in the field of view. Recommend clinical correlation. 4. Left thyroid nodule measuring 2.4 cm. Recommend thyroid US, however if patient has significant comorbidities or limited life expectancy, no follow-up is recommended. (Ref: J Am Coll Radiol. 2015 Feb;12(2): 143-50). Aortic Atherosclerosis (ICD10-I70.0).  Electronically Signed   By: Keith Rake M.D.   On: 04/25/2020 20:47   US Abdomen Limited RUQ  Result Date: 04/25/2020 CLINICAL DATA:  Hypertension and altered mental status. EXAM: ULTRASOUND ABDOMEN LIMITED RIGHT UPPER QUADRANT COMPARISON:  October 31, 2019 FINDINGS: Gallbladder: Multiple shadowing echogenic foci are seen within the gallbladder lumen, near the gallbladder neck. The largest measures approximately 2.1 cm. A 4.7 mm nonshadowing echogenic focus is seen along the nondependent portion of the gallbladder wall. The gallbladder wall measures 4.0 mm in thickness. Mild amount of pericholecystic fluid is seen. No sonographic Murphy sign noted by sonographer. Common bile duct: Diameter: 2.4 mm Liver: No focal lesion identified. Within normal limits in parenchymal echogenicity. Portal vein is patent on color Doppler imaging with normal direction of blood flow towards the liver. Other: Of incidental note is the presence of a 4.0 cm x 3.4 cm x 3.8 cm complex hypoechoic and anechoic right renal mass. IMPRESSION: 1. Cholelithiasis and a mild amount of pericholecystic fluid, without evidence of acute cholecystitis. 2. Subcentimeter gallbladder polyp. 3. Complex right renal mass which may correspond to one of the complex renal cysts seen on the prior renal ultrasound. Correlation with  contrast-enhanced abdomen pelvis CT is recommended. Electronically Signed   By: Virgina Norfolk M.D.   On: 04/25/2020 21:56    EKG: Independently reviewed. Sinus tachycardia with PVCs. Rate is faster and PVC new when compared to previous.  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 female with medical history significant for type 2 diabetes, HTN, HLD, CKD stage IV, history of CVA, and depression/anxiety who is admitted with AKI on CKD stage IV.  AKI on CKD stage IV: Creatinine 5.52 on admission compared to previous of 1.97 two years ago.  Suspect related to hypovolemia. -Continue IV fluid hydration overnight -Repeat labs in a.m. -Check urine sodium and creatinine -Hold Lasix and lisinopril -Avoid NSAIDs and nephrotoxic agents  Cholelithiasis: Cholelithiasis with mild amount of pericholecystic fluid seen on RUQ ultrasound without evidence of acute cholecystitis.  Patient does not have any abdominal pain.  LFTs mildly elevated.  Continue to monitor.  Type 2 diabetes: Place on sensitive SSI while in hospital.  Hypertension: Hold home lisinopril and Lasix due to soft BP on admission and AKI as above.  History of CVA: Continue Plavix and atorvastatin.  Hyperlipidemia: Continue atorvastatin.  Anemia: Hemoglobin 9.5 on admission without obvious bleeding.  FOBT is negative.  Check anemia panel and continue to monitor.  Left thyroid nodule: 2.4 cm left thyroid nodule seen on CT imaging.  Consider follow-up thyroid ultrasound.  TSH pending.  Dilatation of ascending aorta: Seen on CT imaging with maximal  dimension 4 cm.  Annual follow-up by CTA or MRA recommended.  Complex right renal mass: Seen on abdominal ultrasound. May be renal cyst seen on prior renal ultrasound however correlation with contrast-enhanced CT abdomen/pelvis was recommended.  Cannot obtain contrast imaging at this time due to AKI.  Depression/anxiety: Continue Abilify and Remeron.  Continue chronic home Ativan 0.5 mg q6h as needed with hold parameters.  Generalized weakness: PT/OT eval.  DVT prophylaxis: Subcutaneous heparin Code Status:  Full code Family Communication: Attempted to call son, call went to voicemail with full inbox Disposition Plan: From home, dispo pending improvement in renal function and PT/OT eval. Consults called: None Admission status:  Status is: Observation  The patient remains OBS appropriate and will d/c before 2 midnights.  Dispo: The patient is from: Home              Anticipated d/c is to: TBD pending PT/OT eval              Anticipated d/c date is: 1 day              Patient currently is not medically stable to d/c.   Zada Finders MD [facility]  If 7PM-7AM, please contact night-coverage [email or web address]  04/25/2020, 11:02 PM

## 2020-04-25 NOTE — ED Notes (Signed)
Pt transported to CT from CXR

## 2020-04-25 NOTE — ED Provider Notes (Signed)
[facility] EMERGENCY DEPARTMENT Provider Note   CSN: [id number] Arrival date & time: 04/25/20  1640     History Chief Complaint  Patient presents with  . Altered Mental Status    [name] is a 79 y.o. female.  Pt presents to the ED today with AMS.  Pt lives alone.  Family said she was LSN on 9/25.  They said she was confused on the 26th.  The family told EMS that pt had not been taking her meds.  They took her to her pcp office today.  There, she was hypotensive and so EMS was called.  When EMS arrived her bp was nl.  Pt denies any pain.  She said she feels fine.  Pt tells me that she has been vomiting for the past 2 days.  Pt's son is an orthopedist here at [facility].  He said she lives alone, but she's been on the border of needing an ALF.        Past Medical History:  Diagnosis Date  . Anxiety   . Back ache   . Breast cancer ([place])    right  . Bronchitis   . Cancer of colon ([place])   . Depression   . DM (diabetes mellitus) ([place])   . Dyspnea on exertion   . Fall on steps    age 75 feel down iron stairs couldnot move leg for 6 mths  . Family history of primary TB   . Fracture of ankle, closed 2014   left  . History of chicken pox   . History of hysterectomy 04/1980  . History of primary TB   . Hyperlipidemia   . Hypertension   . Kidney disease, chronic, stage III (moderate, EGFR 30-59 ml/min) 2017  . Kidney disorder    reduction function  . Kidney stones   . Knee pain, right   . Lung abnormality    age 39 patient had pna spot on lungs   . Microalbuminuria   . Muscle cramps    both legs  . Muscular degeneration    of right eye  . Nephrolithiasis   . Palpitations   . Personal history of radiation therapy   . Stroke [facility])     Patient Active Problem List   Diagnosis Date Noted  . Acute kidney injury superimposed on CKD ([place]) 04/25/2020  . History of CVA (cerebrovascular accident) 04/25/2020  . Depression with anxiety 04/25/2020  .  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 Left thyroid nodule 04/25/2020  . Right renal mass 04/25/2020  . 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 Labile blood pressure   . Elevated blood pressure reading   . CKD (chronic kidney disease), stage III ([place])   . Hyperlipidemia associated with type 2 diabetes mellitus ([place])   . Small vessel disease ([place]) 03/28/2018  . Type 2 diabetes mellitus ([place])   . Anxiety state   . Stage 3 chronic kidney disease ([place])   . Hyperlipidemia   . History of breast cancer   . Acute ischemic stroke ([place])   . TIA (transient ischemic attack) 03/24/2018  . Dyspnea on exertion 05/30/2017  . Sinus tachycardia 05/30/2017  . Mixed hyperlipidemia 05/30/2017  . Hypertension associated with diabetes ([place]) 05/30/2017    Past Surgical History:  Procedure Laterality Date  . ABLATION  2007   fast heart rate  . ANKLE FRACTURE SURGERY Left 2014  . BREAST LUMPECTOMY Right   . CATARACT EXTRACTION Right 2017  . colonscopy  2015  .  HYSTEROTOMY     45 years ago  . TONSILLECTOMY AND ADENOIDECTOMY     age 23     OB History   No obstetric history on file.     Family History  Problem Relation Age of Onset  . Peripheral Artery Disease Mother 67       HX OF POOR CIRCULATION, SMOKING, LEG AMPUTATION  . Cancer Father        cancer of spine  . Asthma Brother 14  . Kidney Stones Brother   . Breast cancer Neg Hx   . Colon cancer Neg Hx   . Colon polyps Neg Hx     Social History   Tobacco Use  . Smoking status: Never Smoker  . Smokeless tobacco: Never Used  Vaping Use  . Vaping Use: Never used  Substance Use Topics  . Alcohol use: No  . Drug use: No    Home Medications Prior to Admission medications   Medication Sig Start Date End Date Taking? Authorizing Provider  acetaminophen (TYLENOL) 325 MG tablet Take 2 tablets (650 mg total) by mouth every 4 (four) hours as needed for mild pain (or temp > 37.5 C (99.5 F)). 04/12/18   Angiulli, Lavon Paganini, PA-C   ARIPiprazole (ABILIFY) 5 MG tablet Take 1 tablet (5 mg total) by mouth daily. 04/12/18   Angiulli, Lavon Paganini, PA-C  atorvastatin (LIPITOR) 80 MG tablet Take 1 tablet (80 mg total) by mouth daily at 6 PM. 04/12/18   Angiulli, Lavon Paganini, PA-C  BD PEN NEEDLE NANO 2ND GEN 32G X 4 MM MISC USE WITH TRESIBA DAILY 07/24/19   [provider]  clopidogrel (PLAVIX) 75 MG tablet Take 1 tablet (75 mg total) by mouth daily. 04/12/18   Angiulli, Lavon Paganini, PA-C  docusate sodium (COLACE) 100 MG capsule Take 100 mg by mouth 2 (two) times daily.    [provider]  furosemide (LASIX) 20 MG tablet Take 20 mg by mouth every morning.  08/30/19   [provider]  insulin lispro protamine-lispro (HUMALOG 75/25 MIX) (75-25) 100 UNIT/ML SUSP injection Inject 45 Units into the skin 2 (two) times daily with a meal.    [provider]  lisinopril (PRINIVIL,ZESTRIL) 10 MG tablet Take 10 mg by mouth daily.    [provider]  LORazepam (ATIVAN) 0.5 MG tablet Take 1 tablet (0.5 mg total) by mouth 4 (four) times daily. 04/12/18   Angiulli, Lavon Paganini, PA-C  mirtazapine (REMERON) 15 MG tablet Take 1 tablet (15 mg total) by mouth at bedtime. 04/12/18   Angiulli, Lavon Paganini, PA-C  Multiple Vitamin (MULTIVITAMIN) capsule Take 1 capsule by mouth daily.    [provider]  multivitamin-lutein (OCUVITE-LUTEIN) CAPS capsule Take 2 capsules by mouth daily.    [provider]  omega-3 acid ethyl esters (LOVAZA) 1 g capsule Take 1 capsule (1 g total) by mouth 2 (two) times daily. Patient taking differently: Take 2 g by mouth 2 (two) times daily.  04/12/18   Angiulli, Lavon Paganini, PA-C  ONETOUCH VERIO test strip USE 1 STRIP TO TEST BLOOD GLUCOSE TWICE DAILY DIAG CODE E11.9 08/16/18   [provider]  sennosides-docusate sodium (SENOKOT-S) 8.6-50 MG tablet Take 1 tablet by mouth daily.    [provider]  venlafaxine XR (EFFEXOR-XR) 150 MG 24 hr capsule Take 1 capsule by mouth  daily. 05/12/18   [provider]    Allergies    Patient has no known allergies.  Review of Systems   Review  of Systems  All other systems reviewed and are negative.   Physical Exam Updated Vital Signs BP (!) 115/93   Pulse (!) 118   Temp 99.7 F (37.6 C) (Oral)   Resp (!) 21   SpO2 97%   Physical Exam Vitals and nursing note reviewed. Exam conducted with a chaperone present.  Constitutional:      Appearance: Normal appearance. She is obese.  HENT:     Head: Normocephalic and atraumatic.     Right Ear: External ear normal.     Left Ear: External ear normal.     Nose: Nose normal.     Mouth/Throat:     Mouth: Mucous membranes are dry.  Eyes:     Extraocular Movements: Extraocular movements intact.     Conjunctiva/sclera: Conjunctivae normal.     Pupils: Pupils are equal, round, and reactive to light.  Cardiovascular:     Rate and Rhythm: Regular rhythm. Tachycardia present.     Pulses: Normal pulses.     Heart sounds: Normal heart sounds.  Pulmonary:     Effort: Pulmonary effort is normal.  Abdominal:     General: Abdomen is flat. Bowel sounds are normal.     Palpations: Abdomen is soft.  Genitourinary:    Rectum: Guaiac result negative.  Musculoskeletal:        General: Normal range of motion.     Cervical back: Normal range of motion and neck supple.  Skin:    General: Skin is warm.     Capillary Refill: Capillary refill takes less than 2 seconds.  Neurological:     General: No focal deficit present.     Mental Status: She is alert and oriented to person, place, and time.  Psychiatric:        Mood and Affect: Mood normal.        Behavior: Behavior normal.     ED Results / Procedures / Treatments   Labs (all labs ordered are listed, but only abnormal results are displayed) Labs Reviewed  CBC WITH DIFFERENTIAL/PLATELET - Abnormal; Notable for the following components:      Result Value   WBC 16.5 (*)    RBC 2.94 (*)    Hemoglobin 9.5 (*)      HCT 29.3 (*)    Neutro Abs 14.5 (*)    Abs Immature Granulocytes 0.11 (*)    All other components within normal limits  COMPREHENSIVE METABOLIC PANEL - Abnormal; Notable for the following components:   CO2 13 (*)    Glucose, Bld 126 (*)    BUN 93 (*)    Creatinine, Ser 5.52 (*)    Albumin 2.7 (*)    AST 65 (*)    ALT 49 (*)    Total Bilirubin 1.3 (*)    GFR calc non Af Amer 7 (*)    GFR calc Af Amer 8 (*)    Anion gap 19 (*)    All other components within normal limits  URINALYSIS, ROUTINE W REFLEX MICROSCOPIC - Abnormal; Notable for the following components:   Color, Urine AMBER (*)    APPearance CLOUDY (*)    Protein, ur 100 (*)    Bacteria, UA RARE (*)    All other components within normal limits  CBG MONITORING, ED - Abnormal; Notable for the following components:   Glucose-Capillary 105 (*)    All other components within normal limits  RESPIRATORY PANEL BY RT PCR (FLU A&B, COVID)  URINE CULTURE  CULTURE, BLOOD (ROUTINE X  2)  CULTURE, BLOOD (ROUTINE X 2)  LACTIC ACID, PLASMA  LIPASE, BLOOD  TSH  POC OCCULT BLOOD, ED    EKG EKG Interpretation  Date/Time:  Friday April 25 2020 16:48:28 EDT Ventricular Rate:  109 PR Interval:    QRS Duration: 88 QT Interval:  313 QTC Calculation: 422 R Axis:   -20 Text Interpretation: Sinus tachycardia Multiform ventricular premature complexes Inferior infarct, old Since last tracing rate faster Confirmed by Isla Pence [phone]) on 04/25/2020 5:11:44 PM   Radiology DG Chest 2 View  Addendum Date: 04/25/2020   ADDENDUM REPORT: 04/25/2020 17:44 ADDENDUM: These results were called by telephone at the time of interpretation on 04/25/2020 at 5:36pm to provider Andrae Claunch , who verbally acknowledged these results. Electronically Signed   By: Iven Finn M.D.   On: 04/25/2020 17:44   Result Date: 04/25/2020 CLINICAL DATA:  Altered mental status.  Tachycardic. EXAM: CHEST - 2 VIEW.  Sitting upright AP. COMPARISON:  None.  FINDINGS: Prominent cardiomediastinal silhouette with suggestion of a widened mediastinum on AP view without patient rotation. Aortic arch calcifications. Bibasilar hazy airspace opacities. Increased interstitial markings. No pulmonary edema. No pleural effusion. No pneumothorax. No acute osseous abnormality. IMPRESSION: 1. Prominent cardiomediastinal silhouette with mild pulmonary edema. 2. Bibasilar hazy airspace opacities likely represent atelectasis. Overlying infection/inflammation not excluded. Electronically Signed: By: Iven Finn M.D. On: 04/25/2020 17:32   CT Head Wo Contrast  Result Date: 04/25/2020 CLINICAL DATA:  Mental status change, unknown cause EXAM: CT HEAD WITHOUT CONTRAST TECHNIQUE: Contiguous axial images were obtained from the base of the skull through the vertex without intravenous contrast. COMPARISON:  CT head 03/24/18. FINDINGS: Brain: Patchy and confluent areas of decreased attenuation are noted throughout the deep and periventricular white matter of the cerebral hemispheres bilaterally, compatible with chronic microvascular ischemic disease. No evidence of acute infarction, hemorrhage, hydrocephalus, extra-axial collection or mass lesion/mass effect. Vascular: No hyperdense vessel or unexpected calcification. Skull: Negative for fracture or focal lesion. Sinuses/Orbits: No acute finding.  Bilateral lens replacement. Other: None. IMPRESSION: No acute intracranial abnormality. Electronically Signed   By: Iven Finn M.D.   On: 04/25/2020 17:44   CT Chest Wo Contrast  Result Date: 04/25/2020 CLINICAL DATA:  Respiratory illness, nondiagnostic xray Hypotension with nausea and vomiting. EXAM: CT CHEST WITHOUT CONTRAST TECHNIQUE: Multidetector CT imaging of the chest was performed following the standard protocol without IV contrast. COMPARISON:  Chest radiograph earlier this day. No remote imaging available. FINDINGS: Cardiovascular: Ectatic ascending aorta at 4 cm. Mild diffuse  aortic atherosclerosis. The descending aorta is tortuous. There is no periaortic stranding. Common origin of the brachiocephalic and left common carotid artery, variant arch anatomy. Left vertebral artery also arises from the aortic arch. Borderline cardiomegaly. There are coronary artery calcifications. No significant pericardial effusion. Mediastinum/Nodes: There is a 2.4 cm hypodense left thyroid nodule. No enlarged mediastinal lymph nodes. There is no bulky hilar adenopathy. No esophageal wall thickening. Lungs/Pleura: Breathing motion artifact in the lung bases. Dependent lower lobe atelectasis, with additional scattered subsegmental atelectasis throughout both lungs. No septal thickening or ground-glass opacity to suggest pulmonary edema. No pulmonary mass or dominant pulmonary nodule, motion limited assessment. No significant pleural effusion. Upper Abdomen: Questionable edema in the gallbladder fossa included on the last image in the field of view. Liquid stool in the included colon. Musculoskeletal: There are no acute or suspicious osseous abnormalities. IMPRESSION: 1. Breathing motion artifact in the lung bases with scattered subsegmental atelectasis. No evidence of pneumonia. 2. Fusiform dilatation  of the ascending aorta maximal dimension 4 cm. Aortic atherosclerosis. Recommend annual imaging followup by CTA or MRA. This recommendation follows 2010 ACCF/AHA/AATS/ACR/ASA/SCA/SCAI/SIR/STS/SVM Guidelines for the Diagnosis and Management of Patients with Thoracic Aortic Disease. Circulation. 2010; 121: E993-Z169. Aortic aneurysm NOS (ICD10-I71.9) 3. Questionable edema in the gallbladder fossa included on the last image in the field of view. Recommend clinical correlation. 4. Left thyroid nodule measuring 2.4 cm. Recommend thyroid US, however if patient has significant comorbidities or limited life expectancy, no follow-up is recommended. (Ref: J Am Coll Radiol. 2015 Feb;12(2): 143-50). Aortic Atherosclerosis  (ICD10-I70.0). Electronically Signed   By: Keith Rake M.D.   On: 04/25/2020 20:47   US Abdomen Limited RUQ  Result Date: 04/25/2020 CLINICAL DATA:  Hypertension and altered mental status. EXAM: ULTRASOUND ABDOMEN LIMITED RIGHT UPPER QUADRANT COMPARISON:  October 31, 2019 FINDINGS: Gallbladder: Multiple shadowing echogenic foci are seen within the gallbladder lumen, near the gallbladder neck. The largest measures approximately 2.1 cm. A 4.7 mm nonshadowing echogenic focus is seen along the nondependent portion of the gallbladder wall. The gallbladder wall measures 4.0 mm in thickness. Mild amount of pericholecystic fluid is seen. No sonographic Murphy sign noted by sonographer. Common bile duct: Diameter: 2.4 mm Liver: No focal lesion identified. Within normal limits in parenchymal echogenicity. Portal vein is patent on color Doppler imaging with normal direction of blood flow towards the liver. Other: Of incidental note is the presence of a 4.0 cm x 3.4 cm x 3.8 cm complex hypoechoic and anechoic right renal mass. IMPRESSION: 1. Cholelithiasis and a mild amount of pericholecystic fluid, without evidence of acute cholecystitis. 2. Subcentimeter gallbladder polyp. 3. Complex right renal mass which may correspond to one of the complex renal cysts seen on the prior renal ultrasound. Correlation with contrast-enhanced abdomen pelvis CT is recommended. Electronically Signed   By: Virgina Norfolk M.D.   On: 04/25/2020 21:56    Procedures Procedures (including critical care time)  Medications Ordered in ED Medications  sodium chloride 0.9 % bolus 1,000 mL (0 mLs Intravenous Stopped 04/25/20 1856)  acetaminophen (TYLENOL) tablet 1,000 mg (1,000 mg Oral Given 04/25/20 1919)  sodium chloride 0.9 % bolus 1,000 mL (0 mLs Intravenous Stopped 04/25/20 2159)    ED Course  I have reviewed the triage vital signs and the nursing notes.  Pertinent labs & imaging results that were available during my care of the  patient were reviewed by me and considered in my medical decision making (see chart for details).    MDM Rules/Calculators/A&P                          Mental status has improved with IVFs.  She has a low grade fever, but no source of infection.  She does have significant AKI, likely from vomiting.  She is no longer vomiting now.  She is able to tolerate po fluids.  GB US shows cholelithiasis without cholecystitis.  CT scan without pneumonia.  Ascending aorta is slightly dilated.  This will need annual f/u.    Pt d/w Dr. Posey Pronto (triad) who will admit.   CRITICAL CARE Performed by: Isla Pence   Total critical care time: 30 minutes  Critical care time was exclusive of separately billable procedures and treating other patients.  Critical care was necessary to treat or prevent imminent or life-threatening deterioration.  Critical care was time spent personally by me on the following activities: development of treatment plan with patient and/or surrogate as well as  nursing, discussions with consultants, evaluation of patient's response to treatment, examination of patient, obtaining history from patient or surrogate, ordering and performing treatments and interventions, ordering and review of laboratory studies, ordering and review of radiographic studies, pulse oximetry and re-evaluation of patient's condition.   Final Clinical Impression(s) / ED Diagnoses Final diagnoses:  Dehydration  AKI (acute kidney injury) ([place])  Anemia, unspecified type  Pain  Calculus of gallbladder without cholecystitis without obstruction  Renal cyst    Rx / DC Orders ED Discharge Orders    None       Isla Pence, MD 04/25/20 2305

## 2020-04-26 ENCOUNTER — Inpatient Hospital Stay (HOSPITAL_COMMUNITY): Payer: Medicare Other

## 2020-04-26 DIAGNOSIS — E860 Dehydration: Secondary | ICD-10-CM | POA: Diagnosis present

## 2020-04-26 DIAGNOSIS — N170 Acute kidney failure with tubular necrosis: Secondary | ICD-10-CM | POA: Diagnosis present

## 2020-04-26 DIAGNOSIS — E041 Nontoxic single thyroid nodule: Secondary | ICD-10-CM | POA: Diagnosis present

## 2020-04-26 DIAGNOSIS — Z90710 Acquired absence of both cervix and uterus: Secondary | ICD-10-CM | POA: Diagnosis not present

## 2020-04-26 DIAGNOSIS — E872 Acidosis: Secondary | ICD-10-CM | POA: Diagnosis present

## 2020-04-26 DIAGNOSIS — Z853 Personal history of malignant neoplasm of breast: Secondary | ICD-10-CM | POA: Diagnosis not present

## 2020-04-26 DIAGNOSIS — Z8673 Personal history of transient ischemic attack (TIA), and cerebral infarction without residual deficits: Secondary | ICD-10-CM | POA: Diagnosis not present

## 2020-04-26 DIAGNOSIS — G9341 Metabolic encephalopathy: Secondary | ICD-10-CM | POA: Diagnosis present

## 2020-04-26 DIAGNOSIS — R0600 Dyspnea, unspecified: Secondary | ICD-10-CM | POA: Diagnosis not present

## 2020-04-26 DIAGNOSIS — Z23 Encounter for immunization: Secondary | ICD-10-CM | POA: Diagnosis not present

## 2020-04-26 DIAGNOSIS — G9340 Encephalopathy, unspecified: Secondary | ICD-10-CM | POA: Diagnosis present

## 2020-04-26 DIAGNOSIS — Z20822 Contact with and (suspected) exposure to covid-19: Secondary | ICD-10-CM | POA: Diagnosis present

## 2020-04-26 DIAGNOSIS — F418 Other specified anxiety disorders: Secondary | ICD-10-CM | POA: Diagnosis not present

## 2020-04-26 DIAGNOSIS — E1165 Type 2 diabetes mellitus with hyperglycemia: Secondary | ICD-10-CM | POA: Diagnosis present

## 2020-04-26 DIAGNOSIS — E1169 Type 2 diabetes mellitus with other specified complication: Secondary | ICD-10-CM | POA: Diagnosis present

## 2020-04-26 DIAGNOSIS — I77810 Thoracic aortic ectasia: Secondary | ICD-10-CM | POA: Diagnosis not present

## 2020-04-26 DIAGNOSIS — I152 Hypertension secondary to endocrine disorders: Secondary | ICD-10-CM | POA: Diagnosis present

## 2020-04-26 DIAGNOSIS — I472 Ventricular tachycardia: Secondary | ICD-10-CM | POA: Diagnosis present

## 2020-04-26 DIAGNOSIS — N189 Chronic kidney disease, unspecified: Secondary | ICD-10-CM | POA: Diagnosis not present

## 2020-04-26 DIAGNOSIS — Z85038 Personal history of other malignant neoplasm of large intestine: Secondary | ICD-10-CM | POA: Diagnosis not present

## 2020-04-26 DIAGNOSIS — R6510 Systemic inflammatory response syndrome (SIRS) of non-infectious origin without acute organ dysfunction: Secondary | ICD-10-CM | POA: Diagnosis present

## 2020-04-26 DIAGNOSIS — E785 Hyperlipidemia, unspecified: Secondary | ICD-10-CM | POA: Diagnosis present

## 2020-04-26 DIAGNOSIS — Z7902 Long term (current) use of antithrombotics/antiplatelets: Secondary | ICD-10-CM | POA: Diagnosis not present

## 2020-04-26 DIAGNOSIS — N184 Chronic kidney disease, stage 4 (severe): Secondary | ICD-10-CM | POA: Diagnosis present

## 2020-04-26 DIAGNOSIS — N2581 Secondary hyperparathyroidism of renal origin: Secondary | ICD-10-CM | POA: Diagnosis present

## 2020-04-26 DIAGNOSIS — N179 Acute kidney failure, unspecified: Secondary | ICD-10-CM | POA: Diagnosis not present

## 2020-04-26 DIAGNOSIS — Z923 Personal history of irradiation: Secondary | ICD-10-CM | POA: Diagnosis not present

## 2020-04-26 DIAGNOSIS — E1122 Type 2 diabetes mellitus with diabetic chronic kidney disease: Secondary | ICD-10-CM | POA: Diagnosis present

## 2020-04-26 DIAGNOSIS — I493 Ventricular premature depolarization: Secondary | ICD-10-CM | POA: Diagnosis not present

## 2020-04-26 DIAGNOSIS — Z794 Long term (current) use of insulin: Secondary | ICD-10-CM | POA: Diagnosis not present

## 2020-04-26 DIAGNOSIS — D631 Anemia in chronic kidney disease: Secondary | ICD-10-CM | POA: Diagnosis present

## 2020-04-26 DIAGNOSIS — E1159 Type 2 diabetes mellitus with other circulatory complications: Secondary | ICD-10-CM | POA: Diagnosis not present

## 2020-04-26 DIAGNOSIS — I129 Hypertensive chronic kidney disease with stage 1 through stage 4 chronic kidney disease, or unspecified chronic kidney disease: Secondary | ICD-10-CM | POA: Diagnosis present

## 2020-04-26 LAB — FERRITIN: Ferritin: 674 ng/mL — ABNORMAL HIGH (ref 11–307)

## 2020-04-26 LAB — CBC
HCT: 26.3 % — ABNORMAL LOW (ref 36.0–46.0)
Hemoglobin: 8.6 g/dL — ABNORMAL LOW (ref 12.0–15.0)
MCH: 32.3 pg (ref 26.0–34.0)
MCHC: 32.7 g/dL (ref 30.0–36.0)
MCV: 98.9 fL (ref 80.0–100.0)
Platelets: 306 10*3/uL (ref 150–400)
RBC: 2.66 MIL/uL — ABNORMAL LOW (ref 3.87–5.11)
RDW: 13.3 % (ref 11.5–15.5)
WBC: 15 10*3/uL — ABNORMAL HIGH (ref 4.0–10.5)
nRBC: 0 % (ref 0.0–0.2)

## 2020-04-26 LAB — I-STAT ARTERIAL BLOOD GAS, ED
Acid-base deficit: 12 mmol/L — ABNORMAL HIGH (ref 0.0–2.0)
Bicarbonate: 11.9 mmol/L — ABNORMAL LOW (ref 20.0–28.0)
Calcium, Ion: 1.19 mmol/L (ref 1.15–1.40)
HCT: 25 % — ABNORMAL LOW (ref 36.0–46.0)
Hemoglobin: 8.5 g/dL — ABNORMAL LOW (ref 12.0–15.0)
O2 Saturation: 95 %
Potassium: 3.4 mmol/L — ABNORMAL LOW (ref 3.5–5.1)
Sodium: 136 mmol/L (ref 135–145)
TCO2: 13 mmol/L — ABNORMAL LOW (ref 22–32)
pCO2 arterial: 21.8 mmHg — ABNORMAL LOW (ref 32.0–48.0)
pH, Arterial: 7.344 — ABNORMAL LOW (ref 7.350–7.450)
pO2, Arterial: 77 mmHg — ABNORMAL LOW (ref 83.0–108.0)

## 2020-04-26 LAB — IRON AND TIBC
Iron: 16 ug/dL — ABNORMAL LOW (ref 28–170)
Saturation Ratios: 9 % — ABNORMAL LOW (ref 10.4–31.8)
TIBC: 185 ug/dL — ABNORMAL LOW (ref 250–450)
UIBC: 169 ug/dL

## 2020-04-26 LAB — COMPREHENSIVE METABOLIC PANEL
ALT: 53 U/L — ABNORMAL HIGH (ref 0–44)
AST: 76 U/L — ABNORMAL HIGH (ref 15–41)
Albumin: 2.5 g/dL — ABNORMAL LOW (ref 3.5–5.0)
Alkaline Phosphatase: 132 U/L — ABNORMAL HIGH (ref 38–126)
Anion gap: 21 — ABNORMAL HIGH (ref 5–15)
BUN: 93 mg/dL — ABNORMAL HIGH (ref 8–23)
CO2: 11 mmol/L — ABNORMAL LOW (ref 22–32)
Calcium: 8.6 mg/dL — ABNORMAL LOW (ref 8.9–10.3)
Chloride: 105 mmol/L (ref 98–111)
Creatinine, Ser: 5.29 mg/dL — ABNORMAL HIGH (ref 0.44–1.00)
GFR calc Af Amer: 8 mL/min — ABNORMAL LOW (ref >60)
GFR calc non Af Amer: 7 mL/min — ABNORMAL LOW (ref >60)
Glucose, Bld: 83 mg/dL (ref 70–99)
Potassium: 3.7 mmol/L (ref 3.5–5.1)
Sodium: 137 mmol/L (ref 135–145)
Total Bilirubin: 1.1 mg/dL (ref 0.3–1.2)
Total Protein: 6.4 g/dL — ABNORMAL LOW (ref 6.5–8.1)

## 2020-04-26 LAB — GLUCOSE, CAPILLARY
Glucose-Capillary: 125 mg/dL — ABNORMAL HIGH (ref 70–99)
Glucose-Capillary: 113 mg/dL — ABNORMAL HIGH (ref 70–99)

## 2020-04-26 LAB — BASIC METABOLIC PANEL
Anion gap: 14 (ref 5–15)
BUN: 92 mg/dL — ABNORMAL HIGH (ref 8–23)
CO2: 13 mmol/L — ABNORMAL LOW (ref 22–32)
Calcium: 8.3 mg/dL — ABNORMAL LOW (ref 8.9–10.3)
Chloride: 109 mmol/L (ref 98–111)
Creatinine, Ser: 4.89 mg/dL — ABNORMAL HIGH (ref 0.44–1.00)
GFR calc Af Amer: 9 mL/min — ABNORMAL LOW (ref >60)
GFR calc non Af Amer: 8 mL/min — ABNORMAL LOW (ref >60)
Glucose, Bld: 141 mg/dL — ABNORMAL HIGH (ref 70–99)
Potassium: 3.8 mmol/L (ref 3.5–5.1)
Sodium: 136 mmol/L (ref 135–145)

## 2020-04-26 LAB — CBG MONITORING, ED: Glucose-Capillary: 115 mg/dL — ABNORMAL HIGH (ref 70–99)

## 2020-04-26 LAB — SODIUM, URINE, RANDOM: Sodium, Ur: 21 mmol/L

## 2020-04-26 LAB — FOLATE: Folate: 38.8 ng/mL (ref >5.9)

## 2020-04-26 LAB — HEMOGLOBIN A1C
Hgb A1c MFr Bld: 7.8 % — ABNORMAL HIGH (ref 4.8–5.6)
Mean Plasma Glucose: 177.16 mg/dL

## 2020-04-26 LAB — CREATININE, URINE, RANDOM: Creatinine, Urine: 221.44 mg/dL

## 2020-04-26 LAB — LIPASE, BLOOD: Lipase: 53 U/L — ABNORMAL HIGH (ref 11–51)

## 2020-04-26 LAB — VITAMIN B12: Vitamin B-12: 427 pg/mL (ref 180–914)

## 2020-04-26 LAB — TSH: TSH: 1.691 u[IU]/mL (ref 0.350–4.500)

## 2020-04-26 MED ORDER — PIPERACILLIN-TAZOBACTAM IN DEX 2-0.25 GM/50ML IV SOLN
2.2500 g | Freq: Three times a day (TID) | INTRAVENOUS | Status: DC
  Administered 2020-04-26 – 2020-04-28 (×6): 2.25 g via INTRAVENOUS
  Filled 2020-04-26 (×9): qty 50

## 2020-04-26 MED ORDER — MIRTAZAPINE 15 MG PO TABS
15.0000 mg | ORAL_TABLET | Freq: Every day | ORAL | Status: DC
  Administered 2020-04-26 – 2020-05-04 (×9): 15 mg via ORAL
  Filled 2020-04-26 (×11): qty 1

## 2020-04-26 MED ORDER — LORAZEPAM 0.5 MG PO TABS
0.5000 mg | ORAL_TABLET | Freq: Four times a day (QID) | ORAL | Status: DC | PRN
  Administered 2020-04-27 – 2020-05-05 (×19): 0.5 mg via ORAL
  Filled 2020-04-26 (×20): qty 1

## 2020-04-26 MED ORDER — ATORVASTATIN CALCIUM 80 MG PO TABS
80.0000 mg | ORAL_TABLET | Freq: Every day | ORAL | Status: DC
  Administered 2020-04-26 – 2020-05-04 (×9): 80 mg via ORAL
  Filled 2020-04-26 (×9): qty 1

## 2020-04-26 MED ORDER — ARIPIPRAZOLE 5 MG PO TABS
5.0000 mg | ORAL_TABLET | Freq: Every day | ORAL | Status: DC
  Administered 2020-04-27 – 2020-05-05 (×9): 5 mg via ORAL
  Filled 2020-04-26 (×10): qty 1

## 2020-04-26 MED ORDER — INSULIN ASPART 100 UNIT/ML ~~LOC~~ SOLN
0.0000 [IU] | Freq: Three times a day (TID) | SUBCUTANEOUS | Status: DC
  Administered 2020-04-26: 2 [IU] via SUBCUTANEOUS
  Administered 2020-04-27: 1 [IU] via SUBCUTANEOUS
  Administered 2020-04-27 (×2): 2 [IU] via SUBCUTANEOUS
  Administered 2020-04-28: 5 [IU] via SUBCUTANEOUS
  Administered 2020-04-28 (×2): 2 [IU] via SUBCUTANEOUS
  Administered 2020-04-29 (×2): 1 [IU] via SUBCUTANEOUS
  Administered 2020-04-29: 3 [IU] via SUBCUTANEOUS
  Administered 2020-04-30: 2 [IU] via SUBCUTANEOUS
  Administered 2020-04-30: 1 [IU] via SUBCUTANEOUS
  Administered 2020-04-30: 2 [IU] via SUBCUTANEOUS
  Administered 2020-05-01: 1 [IU] via SUBCUTANEOUS
  Administered 2020-05-01 – 2020-05-03 (×3): 2 [IU] via SUBCUTANEOUS
  Administered 2020-05-03 (×2): 1 [IU] via SUBCUTANEOUS
  Administered 2020-05-04: 2 [IU] via SUBCUTANEOUS

## 2020-04-26 MED ORDER — STERILE WATER FOR INJECTION IV SOLN
INTRAVENOUS | Status: DC
  Filled 2020-04-26 (×3): qty 850
  Filled 2020-04-26 (×2): qty 150

## 2020-04-26 MED ORDER — CLOPIDOGREL BISULFATE 75 MG PO TABS
75.0000 mg | ORAL_TABLET | Freq: Every day | ORAL | Status: DC
  Administered 2020-04-27 – 2020-05-05 (×9): 75 mg via ORAL
  Filled 2020-04-26 (×10): qty 1

## 2020-04-26 MED ORDER — SODIUM CHLORIDE 0.9 % IV SOLN
510.0000 mg | INTRAVENOUS | Status: AC
  Administered 2020-04-26 – 2020-04-29 (×2): 510 mg via INTRAVENOUS
  Filled 2020-04-26 (×3): qty 17

## 2020-04-26 MED ORDER — PIPERACILLIN-TAZOBACTAM 3.375 G IVPB 30 MIN
3.3750 g | Freq: Once | INTRAVENOUS | Status: AC
  Administered 2020-04-26: 3.375 g via INTRAVENOUS
  Filled 2020-04-26: qty 50

## 2020-04-26 NOTE — ED Notes (Signed)
MS Breakfast Ordered 

## 2020-04-26 NOTE — Evaluation (Addendum)
Physical Therapy Evaluation Patient Details Name: [name] MRN: [id number] DOB: 1941-06-13 Today's Date: 04/26/2020   History of Present Illness  79 yo female with onset of AKI and AMS was brought to ED, noted atelectasis, cholelithiasis, acute encephalopathy, SIRS, and concerns about renal mass.  PMHx:  R eye macular degeneration, colon CA, radiation therapy, thyroid nodule, atherosclerosis, HTN, CKD4, stroke  Clinical Impression  Pt was seen for initial mobility and was fairly able to talk but with some confusion about her situation.  Has a son that is not able to assist her, per pt, who stated he might be too far away to help at home.  Pt is going to be walking with an AD likely, as HHA was challenging and unsteady.  Pt is unable to tolerate much activity, is weak and incontinent, and cannot be OOB for much time before being quite tired.  Follow acutely to work on strength and tolerance for gait to decrease her time at SNF to recover.    Follow Up Recommendations SNF    Equipment Recommendations  None recommended by PT    Recommendations for Other Services       Precautions / Restrictions Precautions Precautions: Fall Precaution Comments: incontinent Restrictions Weight Bearing Restrictions: No      Mobility  Bed Mobility Overal bed mobility: Needs Assistance Bed Mobility: Rolling;Supine to Sit;Sit to Supine Rolling: Mod assist   Supine to sit: Mod assist Sit to supine: Mod assist   General bed mobility comments: mod assist to transition from supine and full sitting but scoots back on bed wiht S  Transfers Overall transfer level: Needs assistance Equipment used: 1 person hand held assist Transfers: Sit to/from [facility] Sit to Stand: Min assist Stand pivot transfers: Min assist       General transfer comment: min assist with cues for hand placement, stands with min guard for cleanup from [facility]  Ambulation/Gait Ambulation/Gait assistance: Min  assist Gait Distance (Feet): 4 Feet Assistive device: 1 person hand held assist Gait Pattern/deviations: Step-to pattern;Narrow base of support;Shuffle;Decreased stride length Gait velocity: reduced   General Gait Details: short stumbling steps but then once at bed can push back with HHA  Stairs            Wheelchair Mobility    Modified Rankin (Stroke Patients Only)       Balance Overall balance assessment: Needs assistance Sitting-balance support: Feet supported;Bilateral upper extremity supported Sitting balance-Leahy Scale: Good     Standing balance support: Single extremity supported Standing balance-Leahy Scale: Poor Standing balance comment: fair with hand hold on stable furniture                             Pertinent Vitals/Pain Pain Assessment: No/denies pain    Home Living Family/patient expects to be discharged to:: Skilled nursing facility                 Additional Comments: no family are present to help determine    Prior Function Level of Independence: Independent with assistive device(s)               Hand Dominance   Dominant Hand: Right    Extremity/Trunk Assessment   Upper Extremity Assessment Upper Extremity Assessment: Generalized weakness    Lower Extremity Assessment Lower Extremity Assessment: Generalized weakness    Cervical / Trunk Assessment Cervical / Trunk Assessment: Normal  Communication   Communication: No difficulties  Cognition Arousal/Alertness: Lethargic  Behavior During Therapy: Flat affect Overall Cognitive Status: No family/caregiver present to determine baseline cognitive functioning                                 General Comments: pt is not able to give a full history but can ask for things appropriately      General Comments General comments (skin integrity, edema, etc.): pt is up to [facility] with some struggle to manage the transition, but once on side of bed afterward  could lift and scoot hips back on bed.  Has unsteady gait but this may be more cognition than actual functional changes.    Exercises     Assessment/Plan    PT Assessment Patient needs continued PT services  PT Problem List Decreased range of motion;Decreased activity tolerance;Decreased strength;Decreased balance;Decreased mobility;Decreased coordination;Decreased cognition;Decreased knowledge of use of DME;Cardiopulmonary status limiting activity       PT Treatment Interventions DME instruction;Gait training;Stair training;Functional mobility training;Therapeutic activities;Therapeutic exercise;Balance training;Neuromuscular re-education;Patient/family education;Cognitive remediation    PT Goals (Current goals can be found in the Care Plan section)  Acute Rehab PT Goals Patient Stated Goal: none stated PT Goal Formulation: With patient Time For Goal Achievement: 05/10/20 Potential to Achieve Goals: Good    Frequency Min 3X/week   Barriers to discharge Inaccessible home environment;Decreased caregiver support may have stairs, son not present and pt could not give details    Co-evaluation               AM-PAC PT "6 Clicks" Mobility  Outcome Measure Help needed turning from your back to your side while in a flat bed without using bedrails?: A Lot Help needed moving from lying on your back to sitting on the side of a flat bed without using bedrails?: A Lot Help needed moving to and from a bed to a chair (including a wheelchair)?: A Lot Help needed standing up from a chair using your arms (e.g., wheelchair or bedside chair)?: A Little Help needed to walk in hospital room?: A Little Help needed climbing 3-5 steps with a railing? : A Lot 6 Click Score: 14    End of Session Equipment Utilized During Treatment: Gait belt Activity Tolerance: Patient limited by fatigue;Treatment limited secondary to medical complications (Comment) Patient left: in bed;with call bell/phone  within reach Nurse Communication: Mobility status PT Visit Diagnosis: Unsteadiness on feet (R26.81);Muscle weakness (generalized) (M62.81);Difficulty in walking, not elsewhere classified (R26.2)    Time: 1443-1540 PT Time Calculation (min) (ACUTE ONLY): 38 min   Charges:   PT Evaluation $PT Eval Moderate Complexity: 1 Mod PT Treatments $Therapeutic Activity: 23-37 mins       Ramond Dial 04/26/2020, 4:25 PM  [name], PT MS Acute Rehab Dept. Number: [place] and [place]

## 2020-04-26 NOTE — Progress Notes (Signed)
Pharmacy Antibiotic Note  [name] is a 79 y.o. female admitted on 04/25/2020 with intra-abdominal infection.  Pharmacy has been consulted for Zosyn dosing.  WBC 15, SCr acutely elevated at 5.29 (BL ~ 1.9)   Plan: -Zosyn 3.375 gm IV once, then start zosyn 2.25 gm IV Q 8 hours -Monitor CBC, renal fx, cultures and clinical progress     Temp (24hrs), Avg:99.7 F (37.6 C), Min:99.7 F (37.6 C), Max:99.7 F (37.6 C)  Recent Labs  Lab 04/25/20 1639 04/25/20 1800 04/26/20 0500  WBC 16.5*  --  15.0*  CREATININE 5.52*  --  5.29*  LATICACIDVEN  --  1.2  --     CrCl cannot be calculated (Unknown ideal weight.).    No Known Allergies  Antimicrobials this admission: Zosyn 10/2 >>   Dose adjustments this admission:   Microbiology results:   Thank you for allowing pharmacy to be a part of this patient's care.  Albertina Parr, PharmD., BCPS, BCCCP Clinical Pharmacist Clinical phone for 04/26/20 until 10pm: [phone] If after 10pm, please refer to [facility] for unit-specific pharmacist

## 2020-04-26 NOTE — Consult Note (Signed)
Reason for Consult: Acute kidney injury on chronic kidney disease stage IV Referring Physician: Dana Allan MD [facility])  HPI:  79 year old Caucasian woman with past medical history significant for type 2 diabetes mellitus, hypertension, history of nephrolithiasis, anxiety/depression, dyslipidemia, osteoarthritis, history of CVA and labs from 2 years ago that suggest underlying chronic kidney disease stage IV at baseline (creatinine 1.9-2.0).  She was brought into the emergency room for the evaluation and management of increased confusion, generalized weakness with lightheadedness for at least 3 days prior to admission.  She reports some diarrhea with nausea and an episode of vomiting but denies any hematochezia, melena or hematemesis.  She denies any dysuria, urgency, frequency, flank pain, fever, chills or hematuria.  She denies any orthostatic symptoms but reports poor oral intake and states that she continued to take her lisinopril (the reliability of this is questionable with the patient's current mental status).  She denies any fever, cough or sputum production leading up to hospitalization.  She is refusing additional testing or procedures until she speaks to her son.  She informs me that she has previously seen a nephrologist but does not recall details.  Past Medical History:  Diagnosis Date  . Anxiety   . Back ache   . Breast cancer ([place])    right  . Bronchitis   . Cancer of colon ([place])   . Depression   . DM (diabetes mellitus) ([place])   . Dyspnea on exertion   . Fall on steps    age 44 feel down iron stairs couldnot move leg for 6 mths  . Family history of primary TB   . Fracture of ankle, closed 2014   left  . History of chicken pox   . History of hysterectomy 04/1980  . History of primary TB   . Hyperlipidemia   . Hypertension   . Kidney disease, chronic, stage III (moderate, EGFR 30-59 ml/min) 2017  . Kidney disorder    reduction function  . Kidney stones   . Knee pain,  right   . Lung abnormality    age 35 patient had pna spot on lungs   . Microalbuminuria   . Muscle cramps    both legs  . Muscular degeneration    of right eye  . Nephrolithiasis   . Palpitations   . Personal history of radiation therapy   . Stroke [facility])     Past Surgical History:  Procedure Laterality Date  . ABLATION  2007   fast heart rate  . ANKLE FRACTURE SURGERY Left 2014  . BREAST LUMPECTOMY Right   . CATARACT EXTRACTION Right 2017  . colonscopy  2015  . HYSTEROTOMY     45 years ago  . TONSILLECTOMY AND ADENOIDECTOMY     age 2    Family History  Problem Relation Age of Onset  . Peripheral Artery Disease Mother 28       HX OF POOR CIRCULATION, SMOKING, LEG AMPUTATION  . Cancer Father        cancer of spine  . Asthma Brother 63  . Kidney Stones Brother   . Breast cancer Neg Hx   . Colon cancer Neg Hx   . Colon polyps Neg Hx     Social History:  reports that she has never smoked. She has never used smokeless tobacco. She reports that she does not drink alcohol and does not use drugs.  Allergies: No Known Allergies  Medications:  Scheduled: . ARIPiprazole  5 mg Oral Daily  .  atorvastatin  80 mg Oral q1800  . clopidogrel  75 mg Oral Daily  . heparin  5,000 Units Subcutaneous Q8H  . insulin aspart  0-9 Units Subcutaneous TID WC  . mirtazapine  15 mg Oral QHS    BMP Latest Ref Rng & Units 04/26/2020 04/25/2020 04/10/2018  Glucose 70 - 99 mg/dL 83 126(H) 214(H)  BUN 8 - 23 mg/dL 93(H) 93(H) 37(H)  Creatinine 0.44 - 1.00 mg/dL 5.29(H) 5.52(H) 1.97(H)  Sodium 135 - 145 mmol/L 137 135 139  Potassium 3.5 - 5.1 mmol/L 3.7 4.3 4.5  Chloride 98 - 111 mmol/L 105 103 106  CO2 22 - 32 mmol/L 11(L) 13(L) 23  Calcium 8.9 - 10.3 mg/dL 8.6(L) 9.3 9.8   CBC Latest Ref Rng & Units 04/26/2020 04/25/2020 03/29/2018  WBC 4.0 - 10.5 K/uL 15.0(H) 16.5(H) 6.8  Hemoglobin 12.0 - 15.0 g/dL 8.6(L) 9.5(L) 11.9(L)  Hematocrit 36 - 46 % 26.3(L) 29.3(L) 36.2  Platelets 150 - 400 K/uL  306 293 207     DG Chest 2 View  Addendum Date: 04/25/2020   ADDENDUM REPORT: 04/25/2020 17:44 ADDENDUM: These results were called by telephone at the time of interpretation on 04/25/2020 at 5:36pm to provider JULIE HAVILAND , who verbally acknowledged these results. Electronically Signed   By: Iven Finn M.D.   On: 04/25/2020 17:44   Result Date: 04/25/2020 CLINICAL DATA:  Altered mental status.  Tachycardic. EXAM: CHEST - 2 VIEW.  Sitting upright AP. COMPARISON:  None. FINDINGS: Prominent cardiomediastinal silhouette with suggestion of a widened mediastinum on AP view without patient rotation. Aortic arch calcifications. Bibasilar hazy airspace opacities. Increased interstitial markings. No pulmonary edema. No pleural effusion. No pneumothorax. No acute osseous abnormality. IMPRESSION: 1. Prominent cardiomediastinal silhouette with mild pulmonary edema. 2. Bibasilar hazy airspace opacities likely represent atelectasis. Overlying infection/inflammation not excluded. Electronically Signed: By: Iven Finn M.D. On: 04/25/2020 17:32   CT Head Wo Contrast  Result Date: 04/25/2020 CLINICAL DATA:  Mental status change, unknown cause EXAM: CT HEAD WITHOUT CONTRAST TECHNIQUE: Contiguous axial images were obtained from the base of the skull through the vertex without intravenous contrast. COMPARISON:  CT head 03/24/18. FINDINGS: Brain: Patchy and confluent areas of decreased attenuation are noted throughout the deep and periventricular white matter of the cerebral hemispheres bilaterally, compatible with chronic microvascular ischemic disease. No evidence of acute infarction, hemorrhage, hydrocephalus, extra-axial collection or mass lesion/mass effect. Vascular: No hyperdense vessel or unexpected calcification. Skull: Negative for fracture or focal lesion. Sinuses/Orbits: No acute finding.  Bilateral lens replacement. Other: None. IMPRESSION: No acute intracranial abnormality. Electronically Signed   By:  Iven Finn M.D.   On: 04/25/2020 17:44   CT Chest Wo Contrast  Result Date: 04/25/2020 CLINICAL DATA:  Respiratory illness, nondiagnostic xray Hypotension with nausea and vomiting. EXAM: CT CHEST WITHOUT CONTRAST TECHNIQUE: Multidetector CT imaging of the chest was performed following the standard protocol without IV contrast. COMPARISON:  Chest radiograph earlier this day. No remote imaging available. FINDINGS: Cardiovascular: Ectatic ascending aorta at 4 cm. Mild diffuse aortic atherosclerosis. The descending aorta is tortuous. There is no periaortic stranding. Common origin of the brachiocephalic and left common carotid artery, variant arch anatomy. Left vertebral artery also arises from the aortic arch. Borderline cardiomegaly. There are coronary artery calcifications. No significant pericardial effusion. Mediastinum/Nodes: There is a 2.4 cm hypodense left thyroid nodule. No enlarged mediastinal lymph nodes. There is no bulky hilar adenopathy. No esophageal wall thickening. Lungs/Pleura: Breathing motion artifact in the lung bases. Dependent lower  lobe atelectasis, with additional scattered subsegmental atelectasis throughout both lungs. No septal thickening or ground-glass opacity to suggest pulmonary edema. No pulmonary mass or dominant pulmonary nodule, motion limited assessment. No significant pleural effusion. Upper Abdomen: Questionable edema in the gallbladder fossa included on the last image in the field of view. Liquid stool in the included colon. Musculoskeletal: There are no acute or suspicious osseous abnormalities. IMPRESSION: 1. Breathing motion artifact in the lung bases with scattered subsegmental atelectasis. No evidence of pneumonia. 2. 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 Aortic atherosclerosis. Recommend annual imaging followup by CTA or MRA. This recommendation follows 2010 ACCF/AHA/AATS/ACR/ASA/SCA/SCAI/SIR/STS/SVM Guidelines for the Diagnosis and  Management of Patients with Thoracic Aortic Disease. Circulation. 2010; 121: F026-V785. Aortic aneurysm NOS (ICD10-I71.9) 3. Questionable edema in the gallbladder fossa included on the last image in the field of view. Recommend clinical correlation. 4. Left thyroid nodule measuring 2.4 cm. Recommend thyroid US, however if patient has significant comorbidities or limited life expectancy, no follow-up is recommended. (Ref: J Am Coll Radiol. 2015 Feb;12(2): 143-50). Aortic Atherosclerosis (ICD10-I70.0). Electronically Signed   By: Keith Rake M.D.   On: 04/25/2020 20:47   US Abdomen Limited RUQ  Result Date: 04/25/2020 CLINICAL DATA:  Hypertension and altered mental status. EXAM: ULTRASOUND ABDOMEN LIMITED RIGHT UPPER QUADRANT COMPARISON:  October 31, 2019 FINDINGS: Gallbladder: Multiple shadowing echogenic foci are seen within the gallbladder lumen, near the gallbladder neck. The largest measures approximately 2.1 cm. A 4.7 mm nonshadowing echogenic focus is seen along the nondependent portion of the gallbladder wall. The gallbladder wall measures 4.0 mm in thickness. Mild amount of pericholecystic fluid is seen. No sonographic Murphy sign noted by sonographer. Common bile duct: Diameter: 2.4 mm Liver: No focal lesion identified. Within normal limits in parenchymal echogenicity. Portal vein is patent on color Doppler imaging with normal direction of blood flow towards the liver. Other: Of incidental note is the presence of a 4.0 cm x 3.4 cm x 3.8 cm complex hypoechoic and anechoic right renal mass. IMPRESSION: 1. Cholelithiasis and a mild amount of pericholecystic fluid, without evidence of acute cholecystitis. 2. Subcentimeter gallbladder polyp. 3. Complex right renal mass which may correspond to one of the complex renal cysts seen on the prior renal ultrasound. Correlation with contrast-enhanced abdomen pelvis CT is recommended. Electronically Signed   By: Virgina Norfolk M.D.   On: 04/25/2020 21:56     Review of Systems  Constitutional: Positive for activity change, appetite change and fatigue. Negative for chills and fever.  HENT: Negative for nosebleeds, sinus pressure, sore throat and trouble swallowing.   Eyes: Negative for pain, redness and visual disturbance.  Respiratory: Negative for chest tightness and shortness of breath.   Cardiovascular: Negative for chest pain and leg swelling.  Gastrointestinal: Positive for diarrhea and nausea. Negative for abdominal pain, blood in stool and vomiting.  Endocrine: Positive for cold intolerance. Negative for heat intolerance, polydipsia and polyphagia.  Genitourinary: Negative for difficulty urinating, dysuria, flank pain, frequency and hematuria.  Musculoskeletal: Positive for arthralgias. Negative for joint swelling and myalgias.  Skin: Negative for pallor and rash.  Neurological: Positive for dizziness and light-headedness. Negative for speech difficulty and headaches.  Psychiatric/Behavioral: Positive for confusion.   Blood pressure 121/65, pulse (!) 111, temperature 99.7 F (37.6 C), temperature source Oral, resp. rate (!) 24, SpO2 98 %. Physical Exam Vitals and nursing note reviewed.  Constitutional:      Appearance: Normal appearance. She is obese. She is ill-appearing.  HENT:  Head: Normocephalic and atraumatic.     Right Ear: External ear normal.     Left Ear: External ear normal.     Nose: Nose normal.     Mouth/Throat:     Mouth: Mucous membranes are dry.     Pharynx: Oropharynx is clear. No oropharyngeal exudate.  Eyes:     General: No scleral icterus.    Conjunctiva/sclera: Conjunctivae normal.     Pupils: Pupils are equal, round, and reactive to light.  Cardiovascular:     Rate and Rhythm: Regular rhythm. Tachycardia present.     Pulses: Normal pulses.     Heart sounds: Normal heart sounds. No murmur heard.   Pulmonary:     Effort: Pulmonary effort is normal.     Breath sounds: Normal breath sounds. No  wheezing or rales.  Abdominal:     General: There is no distension.     Palpations: Abdomen is soft.     Tenderness: There is no abdominal tenderness. There is no guarding.  Musculoskeletal:        General: No swelling.     Cervical back: Normal range of motion and neck supple. No rigidity.     Right lower leg: No edema.     Left lower leg: No edema.  Skin:    General: Skin is warm and dry.     Coloration: Skin is pale.  Neurological:     Mental Status: She is alert. She is disoriented.     Assessment/Plan: 1.  Acute kidney injury on chronic kidney disease stage IV: Underlying chronic kidney disease likely from hypertension and diabetes with acute kidney injury that is most likely hemodynamically mediated in the setting of volume depletion with ongoing ACE inhibitor/diuretic use.  This is corroborated by physical exam and low urine sodium/FeNa.  Urine analysis/sediment does not point towards presence of acute GN and screening for this will be deferred at this time.  Agree with volume resuscitation-isotonic saline and monitoring sequential labs.  She does not have any acute electrolyte abnormality prompting intervention with dialysis.  I have discussed with her son to try and convince her to get renal ultrasound to help complete our work-up and verify that she does not have any structural lesions/obstruction. Avoid nephrotoxic medications including NSAIDs and iodinated intravenous contrast exposure unless the latter is absolutely indicated.  Preferred narcotic agents for pain control are hydromorphone, fentanyl, and methadone. Morphine should not be used. Avoid Baclofen and avoid oral sodium phosphate and magnesium citrate based laxatives / bowel preps. Continue strict Input and Output monitoring. Will monitor the patient closely with you and intervene or adjust therapy as indicated by changes in clinical status/labs  2.  Altered mental status: Suspect metabolic encephalopathy associated with  azotemia but need to rule out underlying infection that may also be contributing to the same.  No clear evidence of UTI and possible focus noted in the lung.  On empiric antimicrobial therapy with Zosyn with cultures pending. 3.  Anion gap metabolic acidosis: Secondary to acute kidney injury, monitor with fluid resuscitation/isotonic sodium bicarbonate. 4.  Anemia: Without overt loss.  Possibly associated with chronic kidney disease and notably with low iron saturation of 9% and a ferritin of 674.  We will give intravenous iron to help augment management of anemia. 5.  Hypertension: Blood pressures on the lower side, continue to hold ACE inhibitor/diuretic at this time.  [place] Troia K. 04/26/2020, 10:37 AM

## 2020-04-26 NOTE — Progress Notes (Signed)
PROGRESS NOTE    [name]  JME:[id number] DOB: 03-May-1941 DOA: 04/25/2020 PCP: Crist Infante, MD  Outpatient Specialists:   Brief Narrative:  [name] is a 79 y.o. female with medical history significant for type 2 diabetes, HTN, HLD, CKD stage IV, history of CVA, and depression/anxiety.  Patient is admitted with acute encephalopathy, SIRS, acute kidney injury on chronic kidney disease stage IV likely secondary to combined volume depletion and possible ATN, cholelithiasis with cholecystic fluid.  Apparently, patient's problem started about 3 to 4 days ago with altered mentation, associated nausea, vomiting and loose stools.  Systolic blood pressure of 70 mmHg was documented prior to admission.  Patient's baseline serum creatinine is 1.97.  Serum creatinine today is 5.3, with BUN of 93.  CO2 is 11 with lactic acid of 1.2.  Systolic blood pressure has improved to 121 mmHg with hydration, tachycardia is improving to low 100 (106 bpm), mild improvement in leukocytosis.  Imaging studies done so far is negative for acute ischemic event, CT chest revealed 4 cm ascending aortic aneurysm and 2.4cm left thyroid nodule.  As documented above, cholelithiasis with pericholecystic fluid is documented.  Discussed with the patient's son, local orthopedic surgeon, Phylliss Bob.  Gaseous abdominal distention is noted, but patient son feels this may be normal for patient.  Nephrology team has been consulted.  We will have a low threshold to start patient on antibiotics while cultures are pending.  We will proceed with HIDA scan.  We will get an ABG, considering significantly low CO2, and will likely change patient's IV fluids to sterile water plus bicarb.  Prognosis is guarded.  Patient remains a full code.  Further management will depend on hospital course.  Assessment & Plan:   Principal Problem:   Acute kidney injury superimposed on CKD ([place]) Active Problems:   Hypertension associated with diabetes ([place])    Type 2 diabetes mellitus ([place])   Hyperlipidemia associated with type 2 diabetes mellitus ([place])   History of CVA (cerebrovascular accident)   Depression with anxiety   Ascending aorta dilatation ([facility])   Left thyroid nodule   Right renal mass   Acute encephalopathy/SIRS: -See above documentation. -Follow cultures. -Continue with IV hydration. -Low threshold to start antibiotics. -Nephrology team consulted for acute kidney injury. -Guarded prognosis.  AKI on CKD stage IV: Creatinine 5.52 on admission compared to previous of 1.97 two years ago.   See above documentation.   AKI could be secondary to combined hypovolemia and ATN.   Change IV fluids to sterile water plus bicarb.   -Nephrology team consulted.   -We defer further care work-up and management of AKI to the renal team.   -Continue to hold Lasix and lisinopril -Avoid NSAIDs and nephrotoxic agents -Dose all medications assuming GFR of less than 10 mils per minute 1.73 m.  Cholelithiasis: Cholelithiasis with mild amount of pericholecystic fluid seen on RUQ ultrasound without evidence of acute cholecystitis.   Patient does not have any abdominal pain.  LFTs mildly elevated.   We will have low threshold to proceed with HIDA scan.    Type 2 diabetes: Place on sensitive SSI while in hospital.  Hypertension: Hold home lisinopril and Lasix due to soft BP on admission and AKI as above.  History of CVA: Continue Plavix and atorvastatin.  Hyperlipidemia: Continue atorvastatin.  Anemia: -Anemia of chronic kidney disease. -Drop in hemoglobin noted is likely secondary to IV hydration. -Further management by nephrology. -Patient will need iron panel and possible ESA, however, patient is noted  to have complex renal mass.    Left thyroid nodule: 2.4 cm left thyroid nodule seen on CT imaging.  Consider follow-up thyroid ultrasound.  TSH pending.  Further management on outpatient basis.  Dilatation of ascending  aorta: Seen on CT imaging with maximal dimension 4 cm.  Annual follow-up by CTA or MRA recommended. Further management on outpatient basis.  Consider referral to CT surgeon on discharge.  Complex right renal mass: Seen on abdominal ultrasound. May be renal cyst seen on prior renal ultrasound however correlation with contrast-enhanced CT abdomen/pelvis was recommended.  Cannot obtain contrast imaging at this time due to AKI. Will need referral to urology when patient is stabilized.  Depression/anxiety: Continue Abilify and Remeron.  Continue chronic home Ativan 0.5 mg q6h as needed with hold parameters.  04/26/2020: We minimize mind altering medications.  We will also adjust medications as per renal function.  Generalized weakness: PT/OT evaluation when patient is more stable.    DVT prophylaxis: Subcutaneous heparin Code Status: Full code (discussed CODE STATUS with patient's son). Family Communication: Patient's son, [name]. Disposition Plan: This will depend on hospital course   Consultants:   Nephrology.  Procedures:   None  Antimicrobials:   We have a low threshold to start patient on IV antibiotics (with dose as per renal function).  Will assume GFR of less than 10 mils per minute per 1.73 m.   Subjective: Patient seen.  Patient is unable to give any significant history. No documented fever. No abdominal pain or tenderness elicited. Abdominal distention, gaseous, noted.  Objective: Vitals:   04/26/20 0745 04/26/20 0800 04/26/20 0815 04/26/20 0830  BP: 131/63 112/63 122/67   Pulse:    (!) 106  Resp: (!) 21 (!) 23 (!) 22 (!) 26  Temp:      TempSrc:      SpO2:    97%    Intake/Output Summary (Last 24 hours) at 04/26/2020 0913 Last data filed at 04/25/2020 2159 Gross per 24 hour  Intake 2000 ml  Output --  Net 2000 ml   There were no vitals filed for this visit.  Examination:  General exam: Appears calm and comfortable  Respiratory system: Clear  to auscultation. Respiratory effort normal. Cardiovascular system: S1 & S2 heard, RRR. No JVD, murmurs, rubs, gallops or clicks. No pedal edema. Gastrointestinal system: Abdomen is nondistended, soft and nontender. No organomegaly or masses felt. Normal bowel sounds heard. Central nervous system: Alert and oriented. No focal neurological deficits. Extremities: Symmetric 5 x 5 power. Skin: No rashes, lesions or ulcers Psychiatry: Judgement and insight appear normal. Mood & affect appropriate.     Data Reviewed: I have personally reviewed following labs and imaging studies  CBC: Recent Labs  Lab 04/25/20 1639 04/26/20 0500  WBC 16.5* 15.0*  NEUTROABS 14.5*  --   HGB 9.5* 8.6*  HCT 29.3* 26.3*  MCV 99.7 98.9  PLT 293 998   Basic Metabolic Panel: Recent Labs  Lab 04/25/20 1639 04/26/20 0500  NA 135 137  K 4.3 3.7  CL 103 105  CO2 13* 11*  GLUCOSE 126* 83  BUN 93* 93*  CREATININE 5.52* 5.29*  CALCIUM 9.3 8.6*   GFR: CrCl cannot be calculated (Unknown ideal weight.). Liver Function Tests: Recent Labs  Lab 04/25/20 1639 04/26/20 0500  AST 65* 76*  ALT 49* 53*  ALKPHOS 123 132*  BILITOT 1.3* 1.1  PROT 6.7 6.4*  ALBUMIN 2.7* 2.5*   Recent Labs  Lab 04/26/20 0500  LIPASE 53*  No results for input(s): AMMONIA in the last 168 hours. Coagulation Profile: No results for input(s): INR, PROTIME in the last 168 hours. Cardiac Enzymes: No results for input(s): CKTOTAL, CKMB, CKMBINDEX, TROPONINI in the last 168 hours. BNP (last 3 results) No results for input(s): PROBNP in the last 8760 hours. HbA1C: Recent Labs    04/26/20 0500  HGBA1C 7.8*   CBG: Recent Labs  Lab 04/25/20 1654  GLUCAP 105*   Lipid Profile: No results for input(s): CHOL, HDL, LDLCALC, TRIG, CHOLHDL, LDLDIRECT in the last 72 hours. Thyroid Function Tests: No results for input(s): TSH, T4TOTAL, FREET4, T3FREE, THYROIDAB in the last 72 hours. Anemia Panel: Recent Labs    04/26/20 0500   VITAMINB12 427  FERRITIN 674*  TIBC 185*  IRON 16*   Urine analysis:    Component Value Date/Time   COLORURINE AMBER (A) 04/25/2020 2212   APPEARANCEUR CLOUDY (A) 04/25/2020 2212   LABSPEC 1.015 04/25/2020 2212   PHURINE 5.0 04/25/2020 2212   GLUCOSEU NEGATIVE 04/25/2020 2212   HGBUR NEGATIVE 04/25/2020 2212   BILIRUBINUR NEGATIVE 04/25/2020 2212   KETONESUR NEGATIVE 04/25/2020 2212   PROTEINUR 100 (A) 04/25/2020 2212   NITRITE NEGATIVE 04/25/2020 2212   LEUKOCYTESUR NEGATIVE 04/25/2020 2212   Sepsis Labs: @LABRCNTIP (procalcitonin:4,lacticidven:4)  ) Recent Results (from the past 240 hour(s))  Culture, blood (routine x 2)     Status: None (Preliminary result)   Collection Time: 04/25/20  5:07 PM   Specimen: BLOOD  Result Value Ref Range Status   Specimen Description BLOOD LEFT ANTECUBITAL  Final   Special Requests   Final    BOTTLES DRAWN AEROBIC AND ANAEROBIC Blood Culture adequate volume   Culture   Final    NO GROWTH < 12 HOURS Performed at [facility], [place] [address], [place], [place] [address]    Report Status PENDING  Incomplete  Respiratory Panel by RT PCR (Flu A&B, Covid) - Nasopharyngeal Swab     Status: None   Collection Time: 04/25/20  7:28 PM   Specimen: Nasopharyngeal Swab  Result Value Ref Range Status   SARS Coronavirus 2 by RT PCR NEGATIVE NEGATIVE Final    Comment: (NOTE) SARS-CoV-2 target nucleic acids are NOT DETECTED.  The SARS-CoV-2 RNA is generally detectable in upper respiratoy specimens during the acute phase of infection. The lowest concentration of SARS-CoV-2 viral copies this assay can detect is 131 copies/mL. A negative result does not preclude SARS-Cov-2 infection and should not be used as the sole basis for treatment or other patient management decisions. A negative result may occur with  improper specimen collection/handling, submission of specimen other than nasopharyngeal swab, presence of viral mutation(s) within  the areas targeted by this assay, and inadequate number of viral copies (<131 copies/mL). A negative result must be combined with clinical observations, patient history, and epidemiological information. The expected result is Negative.  Fact Sheet for Patients:  PinkCheek.be  Fact Sheet for Healthcare Providers:  GravelBags.it  This test is no t yet approved or cleared by the [place] FDA and  has been authorized for detection and/or diagnosis of SARS-CoV-2 by FDA under an Emergency Use Authorization (EUA). This EUA will remain  in effect (meaning this test can be used) for the duration of the COVID-19 declaration under Section 564(b)(1) of the Act, 21 U.S.C. section 360bbb-3(b)(1), unless the authorization is terminated or revoked sooner.     Influenza A by PCR NEGATIVE NEGATIVE Final   Influenza B by PCR NEGATIVE NEGATIVE Final  Comment: (NOTE) The Xpert Xpress SARS-CoV-2/FLU/RSV assay is intended as an aid in  the diagnosis of influenza from Nasopharyngeal swab specimens and  should not be used as a sole basis for treatment. Nasal washings and  aspirates are unacceptable for Xpert Xpress SARS-CoV-2/FLU/RSV  testing.  Fact Sheet for Patients: PinkCheek.be  Fact Sheet for Healthcare Providers: GravelBags.it  This test is not yet approved or cleared by the [place] FDA and  has been authorized for detection and/or diagnosis of SARS-CoV-2 by  FDA under an Emergency Use Authorization (EUA). This EUA will remain  in effect (meaning this test can be used) for the duration of the  Covid-19 declaration under Section 564(b)(1) of the Act, 21  U.S.C. section 360bbb-3(b)(1), unless the authorization is  terminated or revoked. Performed at [facility], [place] [address] [place], [place] [address]   Culture, blood (routine x 2)     Status: None  (Preliminary result)   Collection Time: 04/25/20  7:29 PM   Specimen: BLOOD  Result Value Ref Range Status   Specimen Description BLOOD SITE NOT SPECIFIED  Final   Special Requests   Final    BOTTLES DRAWN AEROBIC AND ANAEROBIC Blood Culture results may not be optimal due to an inadequate volume of blood received in culture bottles   Culture   Final    NO GROWTH < 12 HOURS Performed at [facility], [place] [address] [place], [place] [address]    Report Status PENDING  Incomplete         Radiology Studies: DG Chest 2 View  Addendum Date: 04/25/2020   ADDENDUM REPORT: 04/25/2020 17:44 ADDENDUM: These results were called by telephone at the time of interpretation on 04/25/2020 at 5:36pm to provider JULIE HAVILAND , who verbally acknowledged these results. Electronically Signed   By: Iven Finn M.D.   On: 04/25/2020 17:44   Result Date: 04/25/2020 CLINICAL DATA:  Altered mental status.  Tachycardic. EXAM: CHEST - 2 VIEW.  Sitting upright AP. COMPARISON:  None. FINDINGS: Prominent cardiomediastinal silhouette with suggestion of a widened mediastinum on AP view without patient rotation. Aortic arch calcifications. Bibasilar hazy airspace opacities. Increased interstitial markings. No pulmonary edema. No pleural effusion. No pneumothorax. No acute osseous abnormality. IMPRESSION: 1. Prominent cardiomediastinal silhouette with mild pulmonary edema. 2. Bibasilar hazy airspace opacities likely represent atelectasis. Overlying infection/inflammation not excluded. Electronically Signed: By: Iven Finn M.D. On: 04/25/2020 17:32   CT Head Wo Contrast  Result Date: 04/25/2020 CLINICAL DATA:  Mental status change, unknown cause EXAM: CT HEAD WITHOUT CONTRAST TECHNIQUE: Contiguous axial images were obtained from the base of the skull through the vertex without intravenous contrast. COMPARISON:  CT head 03/24/18. FINDINGS: Brain: Patchy and confluent areas of decreased attenuation are noted  throughout the deep and periventricular white matter of the cerebral hemispheres bilaterally, compatible with chronic microvascular ischemic disease. No evidence of acute infarction, hemorrhage, hydrocephalus, extra-axial collection or mass lesion/mass effect. Vascular: No hyperdense vessel or unexpected calcification. Skull: Negative for fracture or focal lesion. Sinuses/Orbits: No acute finding.  Bilateral lens replacement. Other: None. IMPRESSION: No acute intracranial abnormality. Electronically Signed   By: Iven Finn M.D.   On: 04/25/2020 17:44   CT Chest Wo Contrast  Result Date: 04/25/2020 CLINICAL DATA:  Respiratory illness, nondiagnostic xray Hypotension with nausea and vomiting. EXAM: CT CHEST WITHOUT CONTRAST TECHNIQUE: Multidetector CT imaging of the chest was performed following the standard protocol without IV contrast. COMPARISON:  Chest radiograph earlier this day. No  remote imaging available. FINDINGS: Cardiovascular: Ectatic ascending aorta at 4 cm. Mild diffuse aortic atherosclerosis. The descending aorta is tortuous. There is no periaortic stranding. Common origin of the brachiocephalic and left common carotid artery, variant arch anatomy. Left vertebral artery also arises from the aortic arch. Borderline cardiomegaly. There are coronary artery calcifications. No significant pericardial effusion. Mediastinum/Nodes: There is a 2.4 cm hypodense left thyroid nodule. No enlarged mediastinal lymph nodes. There is no bulky hilar adenopathy. No esophageal wall thickening. Lungs/Pleura: Breathing motion artifact in the lung bases. Dependent lower lobe atelectasis, with additional scattered subsegmental atelectasis throughout both lungs. No septal thickening or ground-glass opacity to suggest pulmonary edema. No pulmonary mass or dominant pulmonary nodule, motion limited assessment. No significant pleural effusion. Upper Abdomen: Questionable edema in the gallbladder fossa included on the last  image in the field of view. Liquid stool in the included colon. Musculoskeletal: There are no acute or suspicious osseous abnormalities. IMPRESSION: 1. Breathing motion artifact in the lung bases with scattered subsegmental atelectasis. No evidence of pneumonia. 2. 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 Aortic atherosclerosis. Recommend annual imaging followup by CTA or MRA. This recommendation follows 2010 ACCF/AHA/AATS/ACR/ASA/SCA/SCAI/SIR/STS/SVM Guidelines for the Diagnosis and Management of Patients with Thoracic Aortic Disease. Circulation. 2010; 121: N462-V035. Aortic aneurysm NOS (ICD10-I71.9) 3. Questionable edema in the gallbladder fossa included on the last image in the field of view. Recommend clinical correlation. 4. Left thyroid nodule measuring 2.4 cm. Recommend thyroid US, however if patient has significant comorbidities or limited life expectancy, no follow-up is recommended. (Ref: J Am Coll Radiol. 2015 Feb;12(2): 143-50). Aortic Atherosclerosis (ICD10-I70.0). Electronically Signed   By: Keith Rake M.D.   On: 04/25/2020 20:47   US Abdomen Limited RUQ  Result Date: 04/25/2020 CLINICAL DATA:  Hypertension and altered mental status. EXAM: ULTRASOUND ABDOMEN LIMITED RIGHT UPPER QUADRANT COMPARISON:  October 31, 2019 FINDINGS: Gallbladder: Multiple shadowing echogenic foci are seen within the gallbladder lumen, near the gallbladder neck. The largest measures approximately 2.1 cm. A 4.7 mm nonshadowing echogenic focus is seen along the nondependent portion of the gallbladder wall. The gallbladder wall measures 4.0 mm in thickness. Mild amount of pericholecystic fluid is seen. No sonographic Murphy sign noted by sonographer. Common bile duct: Diameter: 2.4 mm Liver: No focal lesion identified. Within normal limits in parenchymal echogenicity. Portal vein is patent on color Doppler imaging with normal direction of blood flow towards the liver. Other: Of incidental note is  the presence of a 4.0 cm x 3.4 cm x 3.8 cm complex hypoechoic and anechoic right renal mass. IMPRESSION: 1. Cholelithiasis and a mild amount of pericholecystic fluid, without evidence of acute cholecystitis. 2. Subcentimeter gallbladder polyp. 3. Complex right renal mass which may correspond to one of the complex renal cysts seen on the prior renal ultrasound. Correlation with contrast-enhanced abdomen pelvis CT is recommended. Electronically Signed   By: Virgina Norfolk M.D.   On: 04/25/2020 21:56        Scheduled Meds: . ARIPiprazole  5 mg Oral Daily  . atorvastatin  80 mg Oral q1800  . clopidogrel  75 mg Oral Daily  . heparin  5,000 Units Subcutaneous Q8H  . insulin aspart  0-9 Units Subcutaneous TID WC  . mirtazapine  15 mg Oral QHS   Continuous Infusions: . lactated ringers 100 mL/hr at 04/26/20 0019     LOS: 0 days    Time spent: Greater than 35 minutes.    Dana Allan, MD  [facility] Pager #:  [phone] 7PM-7AM contact night coverage as above

## 2020-04-27 ENCOUNTER — Encounter (HOSPITAL_COMMUNITY): Payer: Self-pay | Admitting: Internal Medicine

## 2020-04-27 ENCOUNTER — Inpatient Hospital Stay (HOSPITAL_COMMUNITY): Payer: Medicare Other

## 2020-04-27 DIAGNOSIS — N189 Chronic kidney disease, unspecified: Secondary | ICD-10-CM | POA: Diagnosis not present

## 2020-04-27 DIAGNOSIS — R0600 Dyspnea, unspecified: Secondary | ICD-10-CM

## 2020-04-27 DIAGNOSIS — E1159 Type 2 diabetes mellitus with other circulatory complications: Secondary | ICD-10-CM

## 2020-04-27 DIAGNOSIS — I493 Ventricular premature depolarization: Secondary | ICD-10-CM

## 2020-04-27 DIAGNOSIS — I152 Hypertension secondary to endocrine disorders: Secondary | ICD-10-CM

## 2020-04-27 DIAGNOSIS — E785 Hyperlipidemia, unspecified: Secondary | ICD-10-CM

## 2020-04-27 DIAGNOSIS — E1169 Type 2 diabetes mellitus with other specified complication: Secondary | ICD-10-CM

## 2020-04-27 DIAGNOSIS — N179 Acute kidney failure, unspecified: Secondary | ICD-10-CM | POA: Diagnosis not present

## 2020-04-27 LAB — URINE CULTURE: Culture: NO GROWTH

## 2020-04-27 LAB — CBC WITH DIFFERENTIAL/PLATELET
Abs Immature Granulocytes: 0.14 10*3/uL — ABNORMAL HIGH (ref 0.00–0.07)
Abs Immature Granulocytes: 0.14 10*3/uL — ABNORMAL HIGH (ref 0.00–0.07)
Basophils Absolute: 0 10*3/uL (ref 0.0–0.1)
Basophils Absolute: 0 10*3/uL (ref 0.0–0.1)
Basophils Relative: 0 %
Basophils Relative: 0 %
Eosinophils Absolute: 0.2 10*3/uL (ref 0.0–0.5)
Eosinophils Absolute: 0.1 10*3/uL (ref 0.0–0.5)
Eosinophils Relative: 1 %
Eosinophils Relative: 1 %
HCT: 18.7 % — ABNORMAL LOW (ref 36.0–46.0)
HCT: 24.2 % — ABNORMAL LOW (ref 36.0–46.0)
Hemoglobin: 6.1 g/dL — CL (ref 12.0–15.0)
Hemoglobin: 8.2 g/dL — ABNORMAL LOW (ref 12.0–15.0)
Immature Granulocytes: 1 %
Immature Granulocytes: 1 %
Lymphocytes Relative: 4 %
Lymphocytes Relative: 4 %
Lymphs Abs: 0.6 10*3/uL — ABNORMAL LOW (ref 0.7–4.0)
Lymphs Abs: 0.5 10*3/uL — ABNORMAL LOW (ref 0.7–4.0)
MCH: 31.4 pg (ref 26.0–34.0)
MCH: 31.3 pg (ref 26.0–34.0)
MCHC: 32.6 g/dL (ref 30.0–36.0)
MCHC: 33.9 g/dL (ref 30.0–36.0)
MCV: 96.4 fL (ref 80.0–100.0)
MCV: 92.4 fL (ref 80.0–100.0)
Monocytes Absolute: 1.2 10*3/uL — ABNORMAL HIGH (ref 0.1–1.0)
Monocytes Absolute: 0.9 10*3/uL (ref 0.1–1.0)
Monocytes Relative: 8 %
Monocytes Relative: 7 %
Neutro Abs: 11.7 10*3/uL — ABNORMAL HIGH (ref 1.7–7.7)
Neutro Abs: 11.2 10*3/uL — ABNORMAL HIGH (ref 1.7–7.7)
Neutrophils Relative %: 86 %
Neutrophils Relative %: 87 %
Platelets: 267 10*3/uL (ref 150–400)
Platelets: 282 10*3/uL (ref 150–400)
RBC: 1.94 MIL/uL — ABNORMAL LOW (ref 3.87–5.11)
RBC: 2.62 MIL/uL — ABNORMAL LOW (ref 3.87–5.11)
RDW: 13.4 % (ref 11.5–15.5)
RDW: 13.3 % (ref 11.5–15.5)
WBC: 13.7 10*3/uL — ABNORMAL HIGH (ref 4.0–10.5)
WBC: 12.9 10*3/uL — ABNORMAL HIGH (ref 4.0–10.5)
nRBC: 0 % (ref 0.0–0.2)
nRBC: 0 % (ref 0.0–0.2)

## 2020-04-27 LAB — RENAL FUNCTION PANEL
Albumin: 2 g/dL — ABNORMAL LOW (ref 3.5–5.0)
Albumin: 2 g/dL — ABNORMAL LOW (ref 3.5–5.0)
Anion gap: 35 — ABNORMAL HIGH (ref 5–15)
Anion gap: 18 — ABNORMAL HIGH (ref 5–15)
BUN: 90 mg/dL — ABNORMAL HIGH (ref 8–23)
BUN: 92 mg/dL — ABNORMAL HIGH (ref 8–23)
CO2: 13 mmol/L — ABNORMAL LOW (ref 22–32)
CO2: 16 mmol/L — ABNORMAL LOW (ref 22–32)
Calcium: 6.8 mg/dL — ABNORMAL LOW (ref 8.9–10.3)
Calcium: 8 mg/dL — ABNORMAL LOW (ref 8.9–10.3)
Chloride: 99 mmol/L (ref 98–111)
Chloride: 103 mmol/L (ref 98–111)
Creatinine, Ser: 4.4 mg/dL — ABNORMAL HIGH (ref 0.44–1.00)
Creatinine, Ser: 4.28 mg/dL — ABNORMAL HIGH (ref 0.44–1.00)
GFR calc Af Amer: 10 mL/min — ABNORMAL LOW (ref >60)
GFR calc Af Amer: 11 mL/min — ABNORMAL LOW (ref >60)
GFR calc non Af Amer: 9 mL/min — ABNORMAL LOW (ref >60)
GFR calc non Af Amer: 9 mL/min — ABNORMAL LOW (ref >60)
Glucose, Bld: 109 mg/dL — ABNORMAL HIGH (ref 70–99)
Glucose, Bld: 195 mg/dL — ABNORMAL HIGH (ref 70–99)
Phosphorus: 4.4 mg/dL (ref 2.5–4.6)
Phosphorus: 4.2 mg/dL (ref 2.5–4.6)
Potassium: 3.2 mmol/L — ABNORMAL LOW (ref 3.5–5.1)
Potassium: 3 mmol/L — ABNORMAL LOW (ref 3.5–5.1)
Sodium: 147 mmol/L — ABNORMAL HIGH (ref 135–145)
Sodium: 137 mmol/L (ref 135–145)

## 2020-04-27 LAB — ABO/RH: ABO/RH(D): O NEG

## 2020-04-27 LAB — GLUCOSE, CAPILLARY
Glucose-Capillary: 133 mg/dL — ABNORMAL HIGH (ref 70–99)
Glucose-Capillary: 167 mg/dL — ABNORMAL HIGH (ref 70–99)
Glucose-Capillary: 158 mg/dL — ABNORMAL HIGH (ref 70–99)
Glucose-Capillary: 162 mg/dL — ABNORMAL HIGH (ref 70–99)

## 2020-04-27 LAB — PREPARE RBC (CROSSMATCH)

## 2020-04-27 LAB — MAGNESIUM
Magnesium: 1.7 mg/dL (ref 1.7–2.4)
Magnesium: 2 mg/dL (ref 1.7–2.4)

## 2020-04-27 LAB — ECHOCARDIOGRAM COMPLETE
Area-P 1/2: 6.54 cm2
S' Lateral: 3.1 cm
Weight: 3488 oz

## 2020-04-27 MED ORDER — SODIUM BICARBONATE 650 MG PO TABS
650.0000 mg | ORAL_TABLET | Freq: Three times a day (TID) | ORAL | Status: DC
  Administered 2020-04-27 – 2020-04-28 (×4): 650 mg via ORAL
  Filled 2020-04-27 (×4): qty 1

## 2020-04-27 MED ORDER — SODIUM CHLORIDE 0.9% IV SOLUTION: Freq: Once | INTRAVENOUS | Status: DC

## 2020-04-27 MED ORDER — ACETAMINOPHEN 325 MG PO TABS
650.0000 mg | ORAL_TABLET | Freq: Once | ORAL | Status: DC
  Filled 2020-04-27: qty 2

## 2020-04-27 MED ORDER — CALCIUM CARBONATE 1250 (500 CA) MG PO TABS
1000.0000 mg | ORAL_TABLET | Freq: Every day | ORAL | Status: DC
  Administered 2020-04-27 – 2020-05-04 (×8): 1000 mg via ORAL
  Filled 2020-04-27 (×8): qty 1

## 2020-04-27 MED ORDER — DIPHENHYDRAMINE HCL 25 MG PO CAPS
25.0000 mg | ORAL_CAPSULE | Freq: Once | ORAL | Status: DC
  Filled 2020-04-27: qty 1

## 2020-04-27 MED ORDER — POTASSIUM CHLORIDE CRYS ER 20 MEQ PO TBCR
40.0000 meq | EXTENDED_RELEASE_TABLET | Freq: Once | ORAL | Status: AC
  Administered 2020-04-27: 40 meq via ORAL
  Filled 2020-04-27: qty 2

## 2020-04-27 MED ORDER — POTASSIUM CHLORIDE CRYS ER 20 MEQ PO TBCR
40.0000 meq | EXTENDED_RELEASE_TABLET | Freq: Once | ORAL | Status: AC
  Administered 2020-04-27: 20 meq via ORAL
  Filled 2020-04-27: qty 2

## 2020-04-27 MED ORDER — FUROSEMIDE 10 MG/ML IJ SOLN
20.0000 mg | Freq: Once | INTRAMUSCULAR | Status: DC
  Filled 2020-04-27: qty 2

## 2020-04-27 MED ORDER — MAGNESIUM SULFATE IN D5W 1-5 GM/100ML-% IV SOLN
1.0000 g | Freq: Once | INTRAVENOUS | Status: AC
  Administered 2020-04-27: 1 g via INTRAVENOUS
  Filled 2020-04-27: qty 100

## 2020-04-27 MED ORDER — POTASSIUM CHLORIDE CRYS ER 20 MEQ PO TBCR: 20.0000 meq | EXTENDED_RELEASE_TABLET | Freq: Two times a day (BID) | ORAL | Status: DC

## 2020-04-27 MED ORDER — LACTATED RINGERS IV SOLN: INTRAVENOUS | Status: AC

## 2020-04-27 NOTE — Progress Notes (Signed)
Patient had 16 beats non-sustained VT and was having frequent PVC's. On call Dr Tonie Griffith made aware; order received for 40 mEq x 1.

## 2020-04-27 NOTE — Progress Notes (Signed)
CRITICAL VALUE ALERT  Critical Value:  Hgb 6.1  Date & Time Notied:  04/27/2020 @ 0320  Provider Notified:Dr. Chotiner  Orders Received/Actions taken: Transfuse 2units RBC

## 2020-04-27 NOTE — Progress Notes (Signed)
Patient ID: [name], female   DOB: 1940/10/20, 79 y.o.   MRN: [id number] [name] Progress Note   Assessment/ Plan:   1.  Acute kidney injury on chronic kidney disease stage IV (basline creatinine 2.0-2.4): Underlying CKD appears likely from hypertension with hemodynamically mediated AKI from volume depletion in the setting of ongoing ACE-I/loop diuretic.  (corroborated by physical exam and low urine sodium/FeNa).  Urine output charted is unimpressive but labs show improving creatinine and acidemia. CT abdomen did not show any hydronephrosis and showed complex left renal cyst.  2.  Altered mental status: Suspect metabolic encephalopathy associated with azotemia but need to rule out underlying infection that may also be contributing to the same.  No clear evidence of UTI and possible focus noted in the lung.  On empiric antimicrobial therapy with Zosyn with blood and urine cultures negative to date. 3.  Anion gap metabolic acidosis: Secondary to acute kidney injury, sluggish improvement- switch to LR and begin PO NaHCO3. 4.  Anemia: Without overt loss.  Possibly associated with chronic kidney disease and notably with low iron saturation of 9% and a ferritin of 674.  S/P IV Fe and PRBCs as IVFs unmasked hemoconcentration and worse anemia. 5.  Hypokalemia: secondary to increased renal blood flow and shifts with sodium bicarbonate. Will switch IVF to LR and begin PO bicarbonate. Replace K via oral route and correct hypomagnesemia. 6. Hypocalcemia: likely chronic and associated with secondary hyperparathyroidism- worsened with isotonic bicarbonate infusion. Begin QHS calcium (corrected Ca is 8.4).   Subjective:   With episode of NSVT overnight   Objective:   BP (!) 128/58 (BP Location: Left Arm)   Pulse (!) 107   Temp 99.3 F (37.4 C) (Oral)   Resp 18   Wt 98.9 kg   SpO2 93%   BMI 33.64 kg/m   Intake/Output Summary (Last 24 hours) at 04/27/2020 1045 Last data filed at  04/27/2020 0830 Gross per 24 hour  Intake 580 ml  Output 150 ml  Net 430 ml   Weight change:   Physical Exam: CHY:IFOYDXAJOIN resting in bed, more interactive and communicative than yesterday OMV:EHMCN regular tachycardia, s1 and s2 with ESM Resp:Decreased breath sounds over bases without rales/rhonchi OBS:JGGE, obese, non-tender, bowel sounds normal Ext:No lower extremity edema.   Imaging: DG Chest 2 View  Addendum Date: 04/25/2020   ADDENDUM REPORT: 04/25/2020 17:44 ADDENDUM: These results were called by telephone at the time of interpretation on 04/25/2020 at 5:36pm to provider JULIE HAVILAND , who verbally acknowledged these results. Electronically Signed   By: Iven Finn M.D.   On: 04/25/2020 17:44   Result Date: 04/25/2020 CLINICAL DATA:  Altered mental status.  Tachycardic. EXAM: CHEST - 2 VIEW.  Sitting upright AP. COMPARISON:  None. FINDINGS: Prominent cardiomediastinal silhouette with suggestion of a widened mediastinum on AP view without patient rotation. Aortic arch calcifications. Bibasilar hazy airspace opacities. Increased interstitial markings. No pulmonary edema. No pleural effusion. No pneumothorax. No acute osseous abnormality. IMPRESSION: 1. Prominent cardiomediastinal silhouette with mild pulmonary edema. 2. Bibasilar hazy airspace opacities likely represent atelectasis. Overlying infection/inflammation not excluded. Electronically Signed: By: Iven Finn M.D. On: 04/25/2020 17:32   DG Abd 1 View  Result Date: 04/26/2020 CLINICAL DATA:  Altered mental status. Nausea and vomiting. Hypertension. EXAM: ABDOMEN - 1 VIEW COMPARISON:  None. FINDINGS: Nonobstructive bowel gas pattern. No radio-opaque calculi or other significant radiographic abnormality are seen. Clips project over the sacrum. Degenerative changes of the lumbar spine. Mild S-shaped thoracolumbar curvature. IMPRESSION:  Nonobstructive bowel gas pattern. Electronically Signed   By: Margaretha Sheffield MD   On:  04/26/2020 11:37   CT Head Wo Contrast  Result Date: 04/25/2020 CLINICAL DATA:  Mental status change, unknown cause EXAM: CT HEAD WITHOUT CONTRAST TECHNIQUE: Contiguous axial images were obtained from the base of the skull through the vertex without intravenous contrast. COMPARISON:  CT head 03/24/18. FINDINGS: Brain: Patchy and confluent areas of decreased attenuation are noted throughout the deep and periventricular white matter of the cerebral hemispheres bilaterally, compatible with chronic microvascular ischemic disease. No evidence of acute infarction, hemorrhage, hydrocephalus, extra-axial collection or mass lesion/mass effect. Vascular: No hyperdense vessel or unexpected calcification. Skull: Negative for fracture or focal lesion. Sinuses/Orbits: No acute finding.  Bilateral lens replacement. Other: None. IMPRESSION: No acute intracranial abnormality. Electronically Signed   By: Iven Finn M.D.   On: 04/25/2020 17:44   CT Chest Wo Contrast  Result Date: 04/25/2020 CLINICAL DATA:  Respiratory illness, nondiagnostic xray Hypotension with nausea and vomiting. EXAM: CT CHEST WITHOUT CONTRAST TECHNIQUE: Multidetector CT imaging of the chest was performed following the standard protocol without IV contrast. COMPARISON:  Chest radiograph earlier this day. No remote imaging available. FINDINGS: Cardiovascular: Ectatic ascending aorta at 4 cm. Mild diffuse aortic atherosclerosis. The descending aorta is tortuous. There is no periaortic stranding. Common origin of the brachiocephalic and left common carotid artery, variant arch anatomy. Left vertebral artery also arises from the aortic arch. Borderline cardiomegaly. There are coronary artery calcifications. No significant pericardial effusion. Mediastinum/Nodes: There is a 2.4 cm hypodense left thyroid nodule. No enlarged mediastinal lymph nodes. There is no bulky hilar adenopathy. No esophageal wall thickening. Lungs/Pleura: Breathing motion artifact in  the lung bases. Dependent lower lobe atelectasis, with additional scattered subsegmental atelectasis throughout both lungs. No septal thickening or ground-glass opacity to suggest pulmonary edema. No pulmonary mass or dominant pulmonary nodule, motion limited assessment. No significant pleural effusion. Upper Abdomen: Questionable edema in the gallbladder fossa included on the last image in the field of view. Liquid stool in the included colon. Musculoskeletal: There are no acute or suspicious osseous abnormalities. IMPRESSION: 1. Breathing motion artifact in the lung bases with scattered subsegmental atelectasis. No evidence of pneumonia. 2. 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 Aortic atherosclerosis. Recommend annual imaging followup by CTA or MRA. This recommendation follows 2010 ACCF/AHA/AATS/ACR/ASA/SCA/SCAI/SIR/STS/SVM Guidelines for the Diagnosis and Management of Patients with Thoracic Aortic Disease. Circulation. 2010; 121: Z025-E527. Aortic aneurysm NOS (ICD10-I71.9) 3. Questionable edema in the gallbladder fossa included on the last image in the field of view. Recommend clinical correlation. 4. Left thyroid nodule measuring 2.4 cm. Recommend thyroid US, however if patient has significant comorbidities or limited life expectancy, no follow-up is recommended. (Ref: J Am Coll Radiol. 2015 Feb;12(2): 143-50). Aortic Atherosclerosis (ICD10-I70.0). Electronically Signed   By: Keith Rake M.D.   On: 04/25/2020 20:47   US Abdomen Limited RUQ  Result Date: 04/25/2020 CLINICAL DATA:  Hypertension and altered mental status. EXAM: ULTRASOUND ABDOMEN LIMITED RIGHT UPPER QUADRANT COMPARISON:  October 31, 2019 FINDINGS: Gallbladder: Multiple shadowing echogenic foci are seen within the gallbladder lumen, near the gallbladder neck. The largest measures approximately 2.1 cm. A 4.7 mm nonshadowing echogenic focus is seen along the nondependent portion of the gallbladder wall. The  gallbladder wall measures 4.0 mm in thickness. Mild amount of pericholecystic fluid is seen. No sonographic Murphy sign noted by sonographer. Common bile duct: Diameter: 2.4 mm Liver: No focal lesion identified. Within normal limits in  parenchymal echogenicity. Portal vein is patent on color Doppler imaging with normal direction of blood flow towards the liver. Other: Of incidental note is the presence of a 4.0 cm x 3.4 cm x 3.8 cm complex hypoechoic and anechoic right renal mass. IMPRESSION: 1. Cholelithiasis and a mild amount of pericholecystic fluid, without evidence of acute cholecystitis. 2. Subcentimeter gallbladder polyp. 3. Complex right renal mass which may correspond to one of the complex renal cysts seen on the prior renal ultrasound. Correlation with contrast-enhanced abdomen pelvis CT is recommended. Electronically Signed   By: Virgina Norfolk M.D.   On: 04/25/2020 21:56    Labs: BMET Recent Labs  Lab 04/25/20 1639 04/26/20 0500 04/26/20 1107 04/26/20 1635 04/27/20 0230  NA 135 137 136 136 147*  K 4.3 3.7 3.4* 3.8 3.2*  CL 103 105  --  109 99  CO2 13* 11*  --  13* 13*  GLUCOSE 126* 83  --  141* 109*  BUN 93* 93*  --  92* 90*  CREATININE 5.52* 5.29*  --  4.89* 4.40*  CALCIUM 9.3 8.6*  --  8.3* 6.8*  PHOS  --   --   --   --  4.4   CBC Recent Labs  Lab 04/25/20 1639 04/26/20 0500 04/26/20 1107 04/27/20 0230  WBC 16.5* 15.0*  --  13.7*  NEUTROABS 14.5*  --   --  11.7*  HGB 9.5* 8.6* 8.5* 6.1*  HCT 29.3* 26.3* 25.0* 18.7*  MCV 99.7 98.9  --  96.4  PLT 293 306  --  267    Medications:    . sodium chloride   Intravenous Once  . acetaminophen  650 mg Oral Once  . ARIPiprazole  5 mg Oral Daily  . atorvastatin  80 mg Oral q1800  . clopidogrel  75 mg Oral Daily  . diphenhydrAMINE  25 mg Oral Once  . furosemide  20 mg Intravenous Once  . heparin  5,000 Units Subcutaneous Q8H  . insulin aspart  0-9 Units Subcutaneous TID WC  . mirtazapine  15 mg Oral QHS   Elmarie Shiley, MD 04/27/2020, 10:45 AM

## 2020-04-27 NOTE — Consult Note (Signed)
Cardiology Consult    Patient ID: [name] MRN: [id number], DOB/AGE: Apr 04, 1941   Admit date: 04/25/2020 Date of Consult: 04/27/2020  Primary Physician: Crist Infante, MD Primary Cardiologist: Elouise Munroe, MD Requesting Provider: Kyra Searles, MD  Patient Profile    [name] is a 79 y.o. female with a history of hypertension, hyperlipidemia, stage IV chronic kidney disease, stroke (August 2019), diabetes, chronic dyspnea on exertion, colon cancer, palpitations status post catheter ablation (details unclear), and anxiety/depression, who is being seen today for the evaluation of PVCs in the setting of hypokalemia/hypomagnesemia, anemia, and admission for acute encephalopathy/SIRS, hypotension, acute on chronic kidney disease, cholelithiasis, and n/v/d at the request of Dr. Marthenia Rolling.  Past Medical History   Past Medical History:  Diagnosis Date  . Anxiety   . Back ache   . Breast cancer ([place])    right  . Bronchitis   . Cancer of colon ([place])   . Carotid arterial disease ([place])    a. 02/2018 Carotid U/S: 1-39% bilat ICA stenosis.  . Chronic Dyspnea on exertion    a. 08/2017 MV: EF 62%, mid ant/apical ant defect. No ischemia-->low risk; b. 02/2018 Echo: EF 60-65%, no rwma, Gr1 DD. Mild AI. Asc Ao 56mm. Triv MR/TR/PR. Nl RV fxn.  . CKD (chronic kidney disease), stage IV ([place]) 2017  . Depression   . DM (diabetes mellitus) ([place])   . Family history of primary TB   . Fracture of ankle, closed 2014   left  . History of chicken pox   . History of hysterectomy 04/1980  . History of primary TB   . Hyperlipidemia   . Hypertension   . Knee pain, right   . Lung abnormality    age 43 patient had pna spot on lungs   . Microalbuminuria   . Muscle cramps    both legs  . Muscular degeneration    of right eye  . Nephrolithiasis   . Palpitations    a. 2007 s/p catheter ablation for tachycardia, though pt is not aware of details.  . Personal history of radiation therapy   . Stroke  [facility])    a. 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 Chronic microvascular ischemia.    Past Surgical History:  Procedure Laterality Date  . ABLATION  2007   fast heart rate  . ANKLE FRACTURE SURGERY Left 2014  . BREAST LUMPECTOMY Right   . CATARACT EXTRACTION Right 2017  . colonscopy  2015  . HYSTEROTOMY     45 years ago  . TONSILLECTOMY AND ADENOIDECTOMY     age 44     Allergies  No Known Allergies  History of Present Illness    79 year old female with above complex past medical history including hypertension, hyperlipidemia, stage IV chronic kidney disease, stroke (August 2018), diabetes, chronic dyspnea exertion, colon cancer, palpitations/tachycardia, anxiety, depression, and nephrolithiasis.  Patient was previously followed by cardiology in [place] and reports a prior history of palpitations and tachycardia, which was treated with catheter ablation in 2007.  She is unsure of the exact diagnosis or what was performed.  She subsequently establish cardiology care in 2018 with Dr. Saunders Revel in the setting of chronic dyspnea on exertion.  She underwent stress testing which showed an EF of 60% with a mid-anterior/apical anterior defect without ischemia.  The study was deemed low risk, and she was medically managed.  In August 2019, she was admitted with stroke.  MRI showed a nonhemorrhagic 12 mm right paramedian pontine infarct  with chronic microvascular ischemia.  Echocardiogram during admission showed normal LV function without regional wall motion abnormalities or significant valvular disease.  Carotid ultrasound showed bilateral 1 to 39% internal carotid artery stenoses.  She was monitored dual antiplatelet therapy.  She was last seen in cardiology clinic via telemedicine visit in February, at which time she was doing well.  She was in her usual state of health until September 26, when she was noticed by family members to be confused.  She was also experiencing generalized  weakness, lightheadedness, nausea, vomiting, and diarrhea.  In the setting of GI symptoms, she was not able to take her home medications.  She was seen by primary care in October 1 was noted to be hypotensive with pressures in the 70s and was referred to the emergency department for further evaluation via EMS.  Upon arrival, she was tachycardic with a rate of 130 and mildly febrile with a temperature of 99.7.  Lab work was notable for leukocytosis at 16.5, anemia with a hemoglobin of 9.5, mild LFT abnormalities, and acute kidney injury with a creatinine of 5.52 (1.9 01 April 2018).  Covid swab was negative.  CT of the head was negative for acute intracranial abnormality.  CT of the chest with contrast showed scattered subsegmental atelectasis in the lung bases without evidence of pneumonia.  Ascending aorta is measured at 4 cm.  Questionable edema in the gallbladder fossa.  2.4 cm left thyroid nodule was also incidentally noted.  Right upper quadrant ultrasound showed cholelithiasis and mild amount of pericholecystic fluid without evidence of acute cholecystitis.  Subcentimeter gallbladder polyp was noted as well as a complex right renal mass.  She was given 2 L of fluid in the ED and subsequently admitted to the hospitalist service.  Blood and urine cultures have been normal.  With IV hydration, she has had stabilization of blood pressure and improvement in renal function with creatinine of 4.40 this morning.  H&H have drifted down to a nadir of 6.1 18.7 this morning.  Follow-up CBC is pending the transfusion has been ordered.  In the setting of acute illness, she has had electrolyte abnormalities with potassium of 3.2 this morning and a magnesium of 1.7.  In this setting, she has had occasional PVCs noted on monitor and there was some concern for nonsustained VT though upon my review, there is evidence of artifact during supposed episodes.  Currently, patient denies any chest pain or dyspnea.  Further, she  denies any recent chest pain or progression of baseline level of dyspnea on exertion at home.  She does note occasional skipped heartbeats which she says have been present for a long time and are generally not that bothersome.  Inpatient Medications    . sodium chloride   Intravenous Once  . acetaminophen  650 mg Oral Once  . ARIPiprazole  5 mg Oral Daily  . atorvastatin  80 mg Oral q1800  . calcium carbonate  1,000 mg of elemental calcium Oral QHS  . clopidogrel  75 mg Oral Daily  . diphenhydrAMINE  25 mg Oral Once  . furosemide  20 mg Intravenous Once  . heparin  5,000 Units Subcutaneous Q8H  . insulin aspart  0-9 Units Subcutaneous TID WC  . mirtazapine  15 mg Oral QHS  . sodium bicarbonate  650 mg Oral TID    Family History    Family History  Problem Relation Age of Onset  . Peripheral Artery Disease Mother 18  HX OF POOR CIRCULATION, SMOKING, LEG AMPUTATION  . Cancer Father        cancer of spine  . Asthma Brother 43  . Kidney Stones Brother   . Breast cancer Neg Hx   . Colon cancer Neg Hx   . Colon polyps Neg Hx    She indicated that her mother is deceased. She indicated that her father is deceased. She indicated that her brother is alive. She indicated that her maternal grandmother is deceased. She indicated that her maternal grandfather is deceased. She indicated that her paternal grandmother is deceased. She indicated that her paternal grandfather is deceased. She indicated that both of her sons are alive. She indicated that all of her three grandchildren are alive. She indicated that the status of her neg hx is unknown.   Social History    Social History   Socioeconomic History  . Marital status: Divorced    Spouse name: Not on file  . Number of children: 2  . Years of education: Not on file  . Highest education level: Not on file  Occupational History  . Not on file  Tobacco Use  . Smoking status: Never Smoker  . Smokeless tobacco: Never Used  Vaping  Use  . Vaping Use: Never used  Substance and Sexual Activity  . Alcohol use: No  . Drug use: No  . Sexual activity: Not Currently  Other Topics Concern  . Not on file  Social History Narrative  . Not on file   Social Determinants of Health   Financial Resource Strain:   . Difficulty of Paying Living Expenses: Not on file  Food Insecurity:   . Worried About Charity fundraiser in the Last Year: Not on file  . Ran Out of Food in the Last Year: Not on file  Transportation Needs:   . Lack of Transportation (Medical): Not on file  . Lack of Transportation (Non-Medical): Not on file  Physical Activity:   . Days of Exercise per Week: Not on file  . Minutes of Exercise per Session: Not on file  Stress:   . Feeling of Stress : Not on file  Social Connections:   . Frequency of Communication with Friends and Family: Not on file  . Frequency of Social Gatherings with Friends and Family: Not on file  . Attends Religious Services: Not on file  . Active Member of Clubs or Organizations: Not on file  . Attends Archivist Meetings: Not on file  . Marital Status: Not on file  Intimate Partner Violence:   . Fear of Current or Ex-Partner: Not on file  . Emotionally Abused: Not on file  . Physically Abused: Not on file  . Sexually Abused: Not on file     Review of Systems    General:  No chills, +++ fever, no night sweats or weight changes.  Cardiovascular:  No chest pain, +++ chronic dyspnea on exertion, no edema, orthopnea, +++ palpitations, no paroxysmal nocturnal dyspnea. Dermatological: No rash, lesions/masses Respiratory: No cough, +++ dyspnea Urologic: No hematuria, dysuria Abdominal:   +++ nausea, vomiting, diarrhea.  No bright red blood per rectum, melena, or hematemesis Neurologic:  No visual changes, +++ wkns, +++ changes in mental status. All other systems reviewed and are otherwise negative except as noted above.  Physical Exam    Blood pressure (!) 128/58,  pulse (!) 107, temperature 99.3 F (37.4 C), temperature source Oral, resp. rate 18, weight 98.9 kg, SpO2 93 %.  General:  Pleasant, NAD Psych: Normal affect. Neuro: Alert and oriented X 3. Moves all extremities spontaneously. HEENT: Normal  Neck: Supple without bruits or JVD. Lungs:  Resp regular and unlabored, CTA. Heart: RRR, tachycardic, no s3, s4, or murmurs. Abdomen: Soft, non-tender, non-distended, BS + x 4.  Extremities: No clubbing, cyanosis or edema. DP/PT/Radials 2+ and equal bilaterally.  Labs      Lab Results  Component Value Date   WBC 13.7 (H) 04/27/2020   HGB 6.1 (LL) 04/27/2020   HCT 18.7 (L) 04/27/2020   MCV 96.4 04/27/2020   PLT 267 04/27/2020    Recent Labs  Lab 04/26/20 0500 04/26/20 1107 04/27/20 0230  NA 137   < > 147*  K 3.7   < > 3.2*  CL 105   < > 99  CO2 11*   < > 13*  BUN 93*   < > 90*  CREATININE 5.29*   < > 4.40*  CALCIUM 8.6*   < > 6.8*  PROT 6.4*  --   --   BILITOT 1.1  --   --   ALKPHOS 132*  --   --   ALT 53*  --   --   AST 76*  --   --   GLUCOSE 83   < > 109*   < > = values in this interval not displayed.    Lab Results  Component Value Date   TSH 1.691 04/26/2020      Radiology Studies    DG Chest 2 View  Addendum Date: 04/25/2020   ADDENDUM REPORT: 04/25/2020 17:44 ADDENDUM: These results were called by telephone at the time of interpretation on 04/25/2020 at 5:36pm to provider JULIE HAVILAND , who verbally acknowledged these results. Electronically Signed   By: Iven Finn M.D.   On: 04/25/2020 17:44   Result Date: 04/25/2020 CLINICAL DATA:  Altered mental status.  Tachycardic. EXAM: CHEST - 2 VIEW.  Sitting upright AP. COMPARISON:  None. FINDINGS: Prominent cardiomediastinal silhouette with suggestion of a widened mediastinum on AP view without patient rotation. Aortic arch calcifications. Bibasilar hazy airspace opacities. Increased interstitial markings. No pulmonary edema. No pleural effusion. No pneumothorax. No acute  osseous abnormality. IMPRESSION: 1. Prominent cardiomediastinal silhouette with mild pulmonary edema. 2. Bibasilar hazy airspace opacities likely represent atelectasis. Overlying infection/inflammation not excluded. Electronically Signed: By: Iven Finn M.D. On: 04/25/2020 17:32   DG Abd 1 View  Result Date: 04/26/2020 CLINICAL DATA:  Altered mental status. Nausea and vomiting. Hypertension. EXAM: ABDOMEN - 1 VIEW COMPARISON:  None. FINDINGS: Nonobstructive bowel gas pattern. No radio-opaque calculi or other significant radiographic abnormality are seen. Clips project over the sacrum. Degenerative changes of the lumbar spine. Mild S-shaped thoracolumbar curvature. IMPRESSION: Nonobstructive bowel gas pattern. Electronically Signed   By: Margaretha Sheffield MD   On: 04/26/2020 11:37   CT Head Wo Contrast  Result Date: 04/25/2020 CLINICAL DATA:  Mental status change, unknown cause EXAM: CT HEAD WITHOUT CONTRAST TECHNIQUE: Contiguous axial images were obtained from the base of the skull through the vertex without intravenous contrast. COMPARISON:  CT head 03/24/18. FINDINGS: Brain: Patchy and confluent areas of decreased attenuation are noted throughout the deep and periventricular white matter of the cerebral hemispheres bilaterally, compatible with chronic microvascular ischemic disease. No evidence of acute infarction, hemorrhage, hydrocephalus, extra-axial collection or mass lesion/mass effect. Vascular: No hyperdense vessel or unexpected calcification. Skull: Negative for fracture or focal lesion. Sinuses/Orbits: No acute finding.  Bilateral lens replacement. Other: None. IMPRESSION: No acute intracranial abnormality.  Electronically Signed   By: Iven Finn M.D.   On: 04/25/2020 17:44   CT Chest Wo Contrast  Result Date: 04/25/2020 CLINICAL DATA:  Respiratory illness, nondiagnostic xray Hypotension with nausea and vomiting. EXAM: CT CHEST WITHOUT CONTRAST TECHNIQUE: Multidetector CT imaging of  the chest was performed following the standard protocol without IV contrast. COMPARISON:  Chest radiograph earlier this day. No remote imaging available. FINDINGS: Cardiovascular: Ectatic ascending aorta at 4 cm. Mild diffuse aortic atherosclerosis. The descending aorta is tortuous. There is no periaortic stranding. Common origin of the brachiocephalic and left common carotid artery, variant arch anatomy. Left vertebral artery also arises from the aortic arch. Borderline cardiomegaly. There are coronary artery calcifications. No significant pericardial effusion. Mediastinum/Nodes: There is a 2.4 cm hypodense left thyroid nodule. No enlarged mediastinal lymph nodes. There is no bulky hilar adenopathy. No esophageal wall thickening. Lungs/Pleura: Breathing motion artifact in the lung bases. Dependent lower lobe atelectasis, with additional scattered subsegmental atelectasis throughout both lungs. No septal thickening or ground-glass opacity to suggest pulmonary edema. No pulmonary mass or dominant pulmonary nodule, motion limited assessment. No significant pleural effusion. Upper Abdomen: Questionable edema in the gallbladder fossa included on the last image in the field of view. Liquid stool in the included colon. Musculoskeletal: There are no acute or suspicious osseous abnormalities. IMPRESSION: 1. Breathing motion artifact in the lung bases with scattered subsegmental atelectasis. No evidence of pneumonia. 2. 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 Aortic atherosclerosis. Recommend annual imaging followup by CTA or MRA. This recommendation follows 2010 ACCF/AHA/AATS/ACR/ASA/SCA/SCAI/SIR/STS/SVM Guidelines for the Diagnosis and Management of Patients with Thoracic Aortic Disease. Circulation. 2010; 121: W098-J191. Aortic aneurysm NOS (ICD10-I71.9) 3. Questionable edema in the gallbladder fossa included on the last image in the field of view. Recommend clinical correlation. 4. Left thyroid  nodule measuring 2.4 cm. Recommend thyroid US, however if patient has significant comorbidities or limited life expectancy, no follow-up is recommended. (Ref: J Am Coll Radiol. 2015 Feb;12(2): 143-50). Aortic Atherosclerosis (ICD10-I70.0). Electronically Signed   By: Keith Rake M.D.   On: 04/25/2020 20:47   US Abdomen Limited RUQ  Result Date: 04/25/2020 CLINICAL DATA:  Hypertension and altered mental status. EXAM: ULTRASOUND ABDOMEN LIMITED RIGHT UPPER QUADRANT COMPARISON:  October 31, 2019 FINDINGS: Gallbladder: Multiple shadowing echogenic foci are seen within the gallbladder lumen, near the gallbladder neck. The largest measures approximately 2.1 cm. A 4.7 mm nonshadowing echogenic focus is seen along the nondependent portion of the gallbladder wall. The gallbladder wall measures 4.0 mm in thickness. Mild amount of pericholecystic fluid is seen. No sonographic Murphy sign noted by sonographer. Common bile duct: Diameter: 2.4 mm Liver: No focal lesion identified. Within normal limits in parenchymal echogenicity. Portal vein is patent on color Doppler imaging with normal direction of blood flow towards the liver. Other: Of incidental note is the presence of a 4.0 cm x 3.4 cm x 3.8 cm complex hypoechoic and anechoic right renal mass. IMPRESSION: 1. Cholelithiasis and a mild amount of pericholecystic fluid, without evidence of acute cholecystitis. 2. Subcentimeter gallbladder polyp. 3. Complex right renal mass which may correspond to one of the complex renal cysts seen on the prior renal ultrasound. Correlation with contrast-enhanced abdomen pelvis CT is recommended. Electronically Signed   By: Virgina Norfolk M.D.   On: 04/25/2020 21:56    ECG & Cardiac Imaging    Sinus tachycardia, 109, PVCs, leftward axis with inferior infarct.  No acute changes - personally reviewed.  Assessment & Plan  1.  PVCs: Patient hospitalized for nausea, vomiting, diarrhea, with subsequent development of  encephalopathy/SIRS, hypotension, acute on chronic stage IV kidney disease, anemia, and cholelithiasis.  During admission, she has had metabolic abnormalities with hypotension and hypomagnesemia-3.2 and 1.7 respectively this morning.  She has also been noted to have occasional PVCs and there was concern about possible nonsustained VT.  TSH is normal.  I have reviewed her telemetry monitoring dating back to admission, in detail today.  She does have occasional/frequent PVCs at times, though episodes labeled as nonsustained VT appear to be baseline artifact.  Supplementation for potassium and magnesium have already been ordered this morning.  Recommend trying to maintain potassium at 4.0 and magnesium at 2.0.  In light of ventricular ectopy and ongoing sinus tachycardia, I will arrange for a 2D echocardiogram to evaluate LV function and rule out structural abnormalities.  Could consider low-dose beta-blocker as pressure allows.  2.  Acute encephalopathy/SIRS: As outlined above, patient presented with encephalopathy and hypotension in the setting of several days of nausea, vomiting, diarrhea and subsequent development of acute on chronic renal failure.  Blood and urine cultures have shown no growth.  She has had improvement with intravenous hydration.  Management per internal medicine.  3.  Acute on chronic stage IV renal failure: The setting of #2, creatinine was elevated above baseline at 5.52 on admission.  This is come down to 4.40 this morning with IV hydration.  Home dose of ACE inhibitor and diuretic on hold.  She continues on IV hydration and is being followed by nephrology.  4.  Normocytic anemia: H&H have drifted down during hospitalization and she is a 6.1 and 18.7 this morning.  Stool for fecal occult blood was negative.  Repeat CBC pending.  Packed red blood cells ordered.  5.  Hypotension with history of hypertension: Admitted with hypotension in the setting of above.  Home doses of ACE inhibitor  and diuretic are on hold.  Blood pressure currently stable.  Continue to follow.  6.  Hyperlipidemia: She is on statin therapy.  Continue to watch LFTs closely.  7.  Cholelithiasis: Noted on abdominal ultrasound without evidence of cholecystitis.  No abdominal pain.  LFTs mildly elevated.  Nausea and vomiting have resolved.  Medicine team considering HIDA scan.  8.  Type 2 diabetes mellitus: Per internal medicine.  9.  Left thyroid nodule: Incidentally noted on CT imaging-2.4 cm left thyroid nodule.  TSH normal.  10.  Ascending aortic dilatation: Ascending aorta measured at 4 cm on CT.  Will need annual follow-up.  11.  Complex right renal mass: Seen on abdominal ultrasound with recommendation for CT abdomen/pelvis with contrast.  Plan for urology follow-up as outpatient.  Signed, Murray Hodgkins, NP 04/27/2020, 11:03 AM  For questions or updates, please contact   Please consult [email or web address] for contact info under Cardiology/STEMI.

## 2020-04-27 NOTE — Progress Notes (Signed)
*  PRELIMINARY RESULTS* Echocardiogram 2D Echocardiogram has been performed.  [name] 04/27/2020, 1:27 PM

## 2020-04-27 NOTE — Progress Notes (Addendum)
PROGRESS NOTE    [name]  WGY:[id number] DOB: 08/20/1940 DOA: 04/25/2020 PCP: Crist Infante, MD  Outpatient Specialists:   Brief Narrative:  [name] is a 79 y.o. female with medical history significant for type 2 diabetes, HTN, HLD, CKD stage IV, history of CVA, and depression/anxiety.  Patient is admitted with acute encephalopathy, SIRS, acute kidney injury on chronic kidney disease stage IV likely secondary to combined volume depletion and possible ATN, cholelithiasis with cholecystic fluid.  Apparently, patient's problem started about 3 to 4 days ago with altered mentation, associated nausea, vomiting and loose stools.  Systolic blood pressure of 70 mmHg was documented prior to admission.  Patient's baseline serum creatinine is 1.97.  Serum creatinine today is 5.3, with BUN of 93.  CO2 is 11 with lactic acid of 1.2.  Systolic blood pressure has improved to 121 mmHg with hydration, tachycardia is improving to low 100 (106 bpm), mild improvement in leukocytosis.  Imaging studies done so far is negative for acute ischemic event, CT chest revealed 4 cm ascending aortic aneurysm and 2.4cm left thyroid nodule.  As documented above, cholelithiasis with pericholecystic fluid is documented.  Discussed with the patient's son, local orthopedic surgeon, Phylliss Bob.  Gaseous abdominal distention is noted, but patient son feels this may be normal for patient.  Nephrology team has been consulted.  We will have a low threshold to start patient on antibiotics while cultures are pending.  We will proceed with HIDA scan.  We will get an ABG, considering significantly low CO2, and will likely change patient's IV fluids to sterile water plus bicarb.  Prognosis is guarded.  Patient remains a full code.  Further management will depend on hospital course.  04/27/2020: Patient seen alongside patient's son, Dr. Lynann Bologna.  Patient looks a lot better today.  Encephalopathy has improved significantly.  No fever or  chills documented.  Intermittent tachycardia is noted.  PVCs noted.  Nonsustained VT is reported.  Cardiology team has been consulted.  Last echocardiogram was in 2019.  Potassium level was 3.2, corrected with KCl 40 M EQ p.o. x1 dose.  Magnesium level was 1.7.  Revealed WBC of 13.7, hemoglobin of 6.1, platelet count of 267.  2 units of packed red blood cells have been ordered, however, will repeat patient's CBC prior to finalizing the decision to transfuse patient with packed red blood cells (this was also discussed with the patient's son).  We will get repeat chest x-ray as minimal expiratory wheezing is noted at the right lung field anteriorly.  Nephrology input is appreciated.  Will need strict I's and O's.  Stool analysis is still pending, however, the diarrhea seems to be improving.  Assessment & Plan:   Principal Problem:   Acute kidney injury superimposed on CKD ([place]) Active Problems:   Hypertension associated with diabetes ([place])   Type 2 diabetes mellitus ([place])   Hyperlipidemia associated with type 2 diabetes mellitus ([place])   History of CVA (cerebrovascular accident)   Depression with anxiety   Ascending aorta dilatation ([facility])   Left thyroid nodule   Right renal mass   Acute encephalopathy   Acute encephalopathy/SIRS: -See above documentation. -Follow cultures. -Continue with IV hydration. -Low threshold to start antibiotics. -Nephrology team consulted for acute kidney injury. -Guarded prognosis. 05/24/2020: Encephalopathy is improving.  Patient is able to provide coherent history today.  No fever or chills.  Leukocytosis has improved slightly.  Will repeat CBC.  Please also see above documentation.  AKI on CKD stage IV: Creatinine 5.52  on admission compared to previous of 1.97 two years ago.   See above documentation.   AKI could be secondary to combined hypovolemia and ATN.   Change IV fluids to sterile water plus bicarb.   -Nephrology team consulted.   -We defer further  care work-up and management of AKI to the renal team.   -Continue to hold Lasix and lisinopril -Avoid NSAIDs and nephrotoxic agents -Dose all medications assuming GFR of less than 10 mils per minute 1.73 m. 05/24/2020: BMP done earlier today revealed sodium of 147, potassium of 3.2, CO2 of 13, BUN of 90 and serum creatinine of four-point Fawze blood sugar of 109.  Magnesium is 1.7.  Albumin is 2.  Anion gap is reported to be 35.  Will repeat BMP.  Calcium is 6.8, however, corrected calcium level will be 8.4.  Will defer to the nephrology team.  Cholelithiasis: Cholelithiasis with mild amount of pericholecystic fluid seen on RUQ ultrasound without evidence of acute cholecystitis.   Patient does not have any abdominal pain.  LFTs mildly elevated.   We will have low threshold to proceed with HIDA scan.   04/27/2020: No abdominal pain reported.  Earliest time for HIDA scan will be tomorrow, 04/28/2020.  Frequent PVCs/nonsustained V. Tach: -Optimize electrolytes. -Cardiology consulted. -Blood pressure to proceed with echocardiogram, however, will defer to the cardiology team. -Repeat CBC and BMP. -For possible blood transfusion if hemoglobin remains concerning.  Type 2 diabetes: Place on sensitive SSI while in hospital.  Hypertension: Hold home lisinopril and Lasix due to soft BP on admission and AKI as above. 04/27/2020: Reasonably controlled.  Continue to monitor closely.  History of CVA: Continue Plavix and atorvastatin.  Hyperlipidemia: Continue atorvastatin.  Anemia: -Anemia of chronic kidney disease. -Drop in hemoglobin noted is likely secondary to IV hydration. -Further management by nephrology. -Patient will need iron panel and possible ESA, however, patient is noted to have complex renal mass.   04/27/2020: CBC revealed hemoglobin of 6.1 g/dL.  Will repeat CBC prior to blood transfusion.  Patient's anemia is definitely multifactorial.  Left thyroid nodule: 2.4 cm left  thyroid nodule seen on CT imaging.  Consider follow-up thyroid ultrasound.  TSH pending.  Further management on outpatient basis.  Dilatation of ascending aorta: Seen on CT imaging with maximal dimension 4 cm.  Annual follow-up by CTA or MRA recommended. Further management on outpatient basis.  Consider referral to CT surgeon on discharge.  Complex right renal mass: Seen on abdominal ultrasound. May be renal cyst seen on prior renal ultrasound however correlation with contrast-enhanced CT abdomen/pelvis was recommended.  Cannot obtain contrast imaging at this time due to AKI. Will need referral to urology when patient is stabilized.  Depression/anxiety: Continue Abilify and Remeron.  Continue chronic home Ativan 0.5 mg q6h as needed with hold parameters.  04/26/2020: We minimize mind altering medications.  We will also adjust medications as per renal function.  Generalized weakness: PT/OT evaluation when patient is more stable.    DVT prophylaxis: Subcutaneous heparin Code Status: Full code (discussed CODE STATUS with patient's son). Family Communication: Patient's son, Rhylee Pucillo. Disposition Plan: This will depend on hospital course   Consultants:   Nephrology.  Procedures:   None  Antimicrobials:   We have a low threshold to start patient on IV antibiotics (with dose as per renal function).  Will assume GFR of less than 10 mils per minute per 1.73 m.   Subjective: Patient seen alongside patient's son. Encephalopathy is improving. No fever or chills.  Objective: Vitals:   04/26/20 1924 04/26/20 2346 04/27/20 0356 04/27/20 0838  BP: 126/75 115/66 90/67 139/68  Pulse: (!) 102   (!) 127  Resp: 18 (!) 22 18 20   Temp: 98.7 F (37.1 C) 98 F (36.7 C) 98.4 F (36.9 C) 99.9 F (37.7 C)  TempSrc: Oral Oral Oral Axillary  SpO2: 98%   93%  Weight:   98.9 kg     Intake/Output Summary (Last 24 hours) at 04/27/2020 0954 Last data filed at 04/26/2020 1355 Gross per  24 hour  Intake 400 ml  Output --  Net 400 ml   Filed Weights   04/27/20 0356  Weight: 98.9 kg    Examination:  General exam: Appears calm and comfortable  Respiratory system: Minimal expiratory wheeze right lung anteriorly.   Cardiovascular system: S1 & S2 with missed beats.   Gastrointestinal system: Abdomen is obese/gaseously distended, soft and nontender. No organomegaly or masses felt. Normal bowel sounds heard. Central nervous system: Alert and oriented. No focal neurological deficits. Extremities: No edema.  Data Reviewed: I have personally reviewed following labs and imaging studies  CBC: Recent Labs  Lab 04/25/20 1639 04/26/20 0500 04/26/20 1107 04/27/20 0230  WBC 16.5* 15.0*  --  13.7*  NEUTROABS 14.5*  --   --  11.7*  HGB 9.5* 8.6* 8.5* 6.1*  HCT 29.3* 26.3* 25.0* 18.7*  MCV 99.7 98.9  --  96.4  PLT 293 306  --  852   Basic Metabolic Panel: Recent Labs  Lab 04/25/20 1639 04/26/20 0500 04/26/20 1107 04/26/20 1635 04/27/20 0230  NA 135 137 136 136 147*  K 4.3 3.7 3.4* 3.8 3.2*  CL 103 105  --  109 99  CO2 13* 11*  --  13* 13*  GLUCOSE 126* 83  --  141* 109*  BUN 93* 93*  --  92* 90*  CREATININE 5.52* 5.29*  --  4.89* 4.40*  CALCIUM 9.3 8.6*  --  8.3* 6.8*  MG  --   --   --   --  1.7  PHOS  --   --   --   --  4.4   GFR: CrCl cannot be calculated (Unknown ideal weight.). Liver Function Tests: Recent Labs  Lab 04/25/20 1639 04/26/20 0500 04/27/20 0230  AST 65* 76*  --   ALT 49* 53*  --   ALKPHOS 123 132*  --   BILITOT 1.3* 1.1  --   PROT 6.7 6.4*  --   ALBUMIN 2.7* 2.5* 2.0*   Recent Labs  Lab 04/26/20 0500  LIPASE 53*   No results for input(s): AMMONIA in the last 168 hours. Coagulation Profile: No results for input(s): INR, PROTIME in the last 168 hours. Cardiac Enzymes: No results for input(s): CKTOTAL, CKMB, CKMBINDEX, TROPONINI in the last 168 hours. BNP (last 3 results) No results for input(s): PROBNP in the last 8760  hours. HbA1C: Recent Labs    04/26/20 0500  HGBA1C 7.8*   CBG: Recent Labs  Lab 04/25/20 1654 04/26/20 1155 04/26/20 1716 04/26/20 2053 04/27/20 0832  GLUCAP 105* 115* 125* 113* 133*   Lipid Profile: No results for input(s): CHOL, HDL, LDLCALC, TRIG, CHOLHDL, LDLDIRECT in the last 72 hours. Thyroid Function Tests: Recent Labs    04/26/20 0500  TSH 1.691   Anemia Panel: Recent Labs    04/26/20 0500  VITAMINB12 427  FOLATE 38.8  FERRITIN 674*  TIBC 185*  IRON 16*   Urine analysis:    Component Value Date/Time  COLORURINE AMBER (A) 04/25/2020 2212   APPEARANCEUR CLOUDY (A) 04/25/2020 2212   LABSPEC 1.015 04/25/2020 2212   PHURINE 5.0 04/25/2020 2212   GLUCOSEU NEGATIVE 04/25/2020 2212   HGBUR NEGATIVE 04/25/2020 2212   BILIRUBINUR NEGATIVE 04/25/2020 [place] 04/25/2020 2212   PROTEINUR 100 (A) 04/25/2020 2212   NITRITE NEGATIVE 04/25/2020 2212   LEUKOCYTESUR NEGATIVE 04/25/2020 2212   Sepsis Labs: @LABRCNTIP (procalcitonin:4,lacticidven:4)  ) Recent Results (from the past 240 hour(s))  Culture, blood (routine x 2)     Status: None (Preliminary result)   Collection Time: 04/25/20  5:07 PM   Specimen: BLOOD  Result Value Ref Range Status   Specimen Description BLOOD LEFT ANTECUBITAL  Final   Special Requests   Final    BOTTLES DRAWN AEROBIC AND ANAEROBIC Blood Culture adequate volume   Culture   Final    NO GROWTH < 12 HOURS Performed at [facility], [place] [address] [place], [place] [address]    Report Status PENDING  Incomplete  Respiratory Panel by RT PCR (Flu A&B, Covid) - Nasopharyngeal Swab     Status: None   Collection Time: 04/25/20  7:28 PM   Specimen: Nasopharyngeal Swab  Result Value Ref Range Status   SARS Coronavirus 2 by RT PCR NEGATIVE NEGATIVE Final    Comment: (NOTE) SARS-CoV-2 target nucleic acids are NOT DETECTED.  The SARS-CoV-2 RNA is generally detectable in upper respiratoy specimens during the acute  phase of infection. The lowest concentration of SARS-CoV-2 viral copies this assay can detect is 131 copies/mL. A negative result does not preclude SARS-Cov-2 infection and should not be used as the sole basis for treatment or other patient management decisions. A negative result may occur with  improper specimen collection/handling, submission of specimen other than nasopharyngeal swab, presence of viral mutation(s) within the areas targeted by this assay, and inadequate number of viral copies (<131 copies/mL). A negative result must be combined with clinical observations, patient history, and epidemiological information. The expected result is Negative.  Fact Sheet for Patients:  PinkCheek.be  Fact Sheet for Healthcare Providers:  GravelBags.it  This test is no t yet approved or cleared by the [place] FDA and  has been authorized for detection and/or diagnosis of SARS-CoV-2 by FDA under an Emergency Use Authorization (EUA). This EUA will remain  in effect (meaning this test can be used) for the duration of the COVID-19 declaration under Section 564(b)(1) of the Act, 21 U.S.C. section 360bbb-3(b)(1), unless the authorization is terminated or revoked sooner.     Influenza A by PCR NEGATIVE NEGATIVE Final   Influenza B by PCR NEGATIVE NEGATIVE Final    Comment: (NOTE) The Xpert Xpress SARS-CoV-2/FLU/RSV assay is intended as an aid in  the diagnosis of influenza from Nasopharyngeal swab specimens and  should not be used as a sole basis for treatment. Nasal washings and  aspirates are unacceptable for Xpert Xpress SARS-CoV-2/FLU/RSV  testing.  Fact Sheet for Patients: PinkCheek.be  Fact Sheet for Healthcare Providers: GravelBags.it  This test is not yet approved or cleared by the [place] FDA and  has been authorized for detection and/or diagnosis of  SARS-CoV-2 by  FDA under an Emergency Use Authorization (EUA). This EUA will remain  in effect (meaning this test can be used) for the duration of the  Covid-19 declaration under Section 564(b)(1) of the Act, 21  U.S.C. section 360bbb-3(b)(1), unless the authorization is  terminated or revoked. Performed at [facility], [place]  [address] [place], [place] [address]   Culture, blood (routine x 2)     Status: None (Preliminary result)   Collection Time: 04/25/20  7:29 PM   Specimen: BLOOD  Result Value Ref Range Status   Specimen Description BLOOD SITE NOT SPECIFIED  Final   Special Requests   Final    BOTTLES DRAWN AEROBIC AND ANAEROBIC Blood Culture results may not be optimal due to an inadequate volume of blood received in culture bottles   Culture   Final    NO GROWTH < 12 HOURS Performed at [facility], [place] [address] [place], [place] [address]    Report Status PENDING  Incomplete         Radiology Studies: DG Chest 2 View  Addendum Date: 04/25/2020   ADDENDUM REPORT: 04/25/2020 17:44 ADDENDUM: These results were called by telephone at the time of interpretation on 04/25/2020 at 5:36pm to provider JULIE HAVILAND , who verbally acknowledged these results. Electronically Signed   By: Iven Finn M.D.   On: 04/25/2020 17:44   Result Date: 04/25/2020 CLINICAL DATA:  Altered mental status.  Tachycardic. EXAM: CHEST - 2 VIEW.  Sitting upright AP. COMPARISON:  None. FINDINGS: Prominent cardiomediastinal silhouette with suggestion of a widened mediastinum on AP view without patient rotation. Aortic arch calcifications. Bibasilar hazy airspace opacities. Increased interstitial markings. No pulmonary edema. No pleural effusion. No pneumothorax. No acute osseous abnormality. IMPRESSION: 1. Prominent cardiomediastinal silhouette with mild pulmonary edema. 2. Bibasilar hazy airspace opacities likely represent atelectasis. Overlying infection/inflammation not excluded.  Electronically Signed: By: Iven Finn M.D. On: 04/25/2020 17:32   DG Abd 1 View  Result Date: 04/26/2020 CLINICAL DATA:  Altered mental status. Nausea and vomiting. Hypertension. EXAM: ABDOMEN - 1 VIEW COMPARISON:  None. FINDINGS: Nonobstructive bowel gas pattern. No radio-opaque calculi or other significant radiographic abnormality are seen. Clips project over the sacrum. Degenerative changes of the lumbar spine. Mild S-shaped thoracolumbar curvature. IMPRESSION: Nonobstructive bowel gas pattern. Electronically Signed   By: Margaretha Sheffield MD   On: 04/26/2020 11:37   CT Head Wo Contrast  Result Date: 04/25/2020 CLINICAL DATA:  Mental status change, unknown cause EXAM: CT HEAD WITHOUT CONTRAST TECHNIQUE: Contiguous axial images were obtained from the base of the skull through the vertex without intravenous contrast. COMPARISON:  CT head 03/24/18. FINDINGS: Brain: Patchy and confluent areas of decreased attenuation are noted throughout the deep and periventricular white matter of the cerebral hemispheres bilaterally, compatible with chronic microvascular ischemic disease. No evidence of acute infarction, hemorrhage, hydrocephalus, extra-axial collection or mass lesion/mass effect. Vascular: No hyperdense vessel or unexpected calcification. Skull: Negative for fracture or focal lesion. Sinuses/Orbits: No acute finding.  Bilateral lens replacement. Other: None. IMPRESSION: No acute intracranial abnormality. Electronically Signed   By: Iven Finn M.D.   On: 04/25/2020 17:44   CT Chest Wo Contrast  Result Date: 04/25/2020 CLINICAL DATA:  Respiratory illness, nondiagnostic xray Hypotension with nausea and vomiting. EXAM: CT CHEST WITHOUT CONTRAST TECHNIQUE: Multidetector CT imaging of the chest was performed following the standard protocol without IV contrast. COMPARISON:  Chest radiograph earlier this day. No remote imaging available. FINDINGS: Cardiovascular: Ectatic ascending aorta at 4 cm. Mild  diffuse aortic atherosclerosis. The descending aorta is tortuous. There is no periaortic stranding. Common origin of the brachiocephalic and left common carotid artery, variant arch anatomy. Left vertebral artery also arises from the aortic arch. Borderline cardiomegaly. There are coronary artery calcifications. No significant pericardial effusion. Mediastinum/Nodes: There is a 2.4 cm hypodense  left thyroid nodule. No enlarged mediastinal lymph nodes. There is no bulky hilar adenopathy. No esophageal wall thickening. Lungs/Pleura: Breathing motion artifact in the lung bases. Dependent lower lobe atelectasis, with additional scattered subsegmental atelectasis throughout both lungs. No septal thickening or ground-glass opacity to suggest pulmonary edema. No pulmonary mass or dominant pulmonary nodule, motion limited assessment. No significant pleural effusion. Upper Abdomen: Questionable edema in the gallbladder fossa included on the last image in the field of view. Liquid stool in the included colon. Musculoskeletal: There are no acute or suspicious osseous abnormalities. IMPRESSION: 1. Breathing motion artifact in the lung bases with scattered subsegmental atelectasis. No evidence of pneumonia. 2. 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 Aortic atherosclerosis. Recommend annual imaging followup by CTA or MRA. This recommendation follows 2010 ACCF/AHA/AATS/ACR/ASA/SCA/SCAI/SIR/STS/SVM Guidelines for the Diagnosis and Management of Patients with Thoracic Aortic Disease. Circulation. 2010; 121: I503-U882. Aortic aneurysm NOS (ICD10-I71.9) 3. Questionable edema in the gallbladder fossa included on the last image in the field of view. Recommend clinical correlation. 4. Left thyroid nodule measuring 2.4 cm. Recommend thyroid US, however if patient has significant comorbidities or limited life expectancy, no follow-up is recommended. (Ref: J Am Coll Radiol. 2015 Feb;12(2): 143-50). Aortic  Atherosclerosis (ICD10-I70.0). Electronically Signed   By: Keith Rake M.D.   On: 04/25/2020 20:47   US Abdomen Limited RUQ  Result Date: 04/25/2020 CLINICAL DATA:  Hypertension and altered mental status. EXAM: ULTRASOUND ABDOMEN LIMITED RIGHT UPPER QUADRANT COMPARISON:  October 31, 2019 FINDINGS: Gallbladder: Multiple shadowing echogenic foci are seen within the gallbladder lumen, near the gallbladder neck. The largest measures approximately 2.1 cm. A 4.7 mm nonshadowing echogenic focus is seen along the nondependent portion of the gallbladder wall. The gallbladder wall measures 4.0 mm in thickness. Mild amount of pericholecystic fluid is seen. No sonographic Murphy sign noted by sonographer. Common bile duct: Diameter: 2.4 mm Liver: No focal lesion identified. Within normal limits in parenchymal echogenicity. Portal vein is patent on color Doppler imaging with normal direction of blood flow towards the liver. Other: Of incidental note is the presence of a 4.0 cm x 3.4 cm x 3.8 cm complex hypoechoic and anechoic right renal mass. IMPRESSION: 1. Cholelithiasis and a mild amount of pericholecystic fluid, without evidence of acute cholecystitis. 2. Subcentimeter gallbladder polyp. 3. Complex right renal mass which may correspond to one of the complex renal cysts seen on the prior renal ultrasound. Correlation with contrast-enhanced abdomen pelvis CT is recommended. Electronically Signed   By: Virgina Norfolk M.D.   On: 04/25/2020 21:56        Scheduled Meds: . sodium chloride   Intravenous Once  . acetaminophen  650 mg Oral Once  . ARIPiprazole  5 mg Oral Daily  . atorvastatin  80 mg Oral q1800  . clopidogrel  75 mg Oral Daily  . diphenhydrAMINE  25 mg Oral Once  . furosemide  20 mg Intravenous Once  . heparin  5,000 Units Subcutaneous Q8H  . insulin aspart  0-9 Units Subcutaneous TID WC  . mirtazapine  15 mg Oral QHS   Continuous Infusions: . ferumoxytol Stopped (04/26/20 1355)  .  magnesium sulfate bolus IVPB 1 g (04/27/20 0900)  . piperacillin-tazobactam (ZOSYN)  IV 2.25 g (04/27/20 0527)  .  sodium bicarbonate (isotonic) infusion in sterile water 125 mL/hr at 04/27/20 0854     LOS: 1 day    Time spent: Greater than 35 minutes.    [place] Allan, MD  [facility] Pager #:  [phone] 7PM-7AM contact night coverage as above  Addendum: Repeat hemoglobin was 8.2 g/dL.  Will hold blood transfusion.  Updated patient's son.  Patient's son broached on if HIDA scan is really needed.  Will hold HIDA Scan for now.

## 2020-04-28 DIAGNOSIS — N189 Chronic kidney disease, unspecified: Secondary | ICD-10-CM

## 2020-04-28 DIAGNOSIS — N179 Acute kidney failure, unspecified: Secondary | ICD-10-CM

## 2020-04-28 LAB — CBC WITH DIFFERENTIAL/PLATELET
Abs Immature Granulocytes: 0.16 10*3/uL — ABNORMAL HIGH (ref 0.00–0.07)
Abs Immature Granulocytes: 0.17 10*3/uL — ABNORMAL HIGH (ref 0.00–0.07)
Basophils Absolute: 0 10*3/uL (ref 0.0–0.1)
Basophils Absolute: 0 10*3/uL (ref 0.0–0.1)
Basophils Relative: 0 %
Basophils Relative: 0 %
Eosinophils Absolute: 0.2 10*3/uL (ref 0.0–0.5)
Eosinophils Absolute: 0.2 10*3/uL (ref 0.0–0.5)
Eosinophils Relative: 2 %
Eosinophils Relative: 2 %
HCT: 25.1 % — ABNORMAL LOW (ref 36.0–46.0)
HCT: 28.6 % — ABNORMAL LOW (ref 36.0–46.0)
Hemoglobin: 8.5 g/dL — ABNORMAL LOW (ref 12.0–15.0)
Hemoglobin: 9.3 g/dL — ABNORMAL LOW (ref 12.0–15.0)
Immature Granulocytes: 1 %
Immature Granulocytes: 2 %
Lymphocytes Relative: 7 %
Lymphocytes Relative: 7 %
Lymphs Abs: 0.8 10*3/uL (ref 0.7–4.0)
Lymphs Abs: 0.7 10*3/uL (ref 0.7–4.0)
MCH: 32 pg (ref 26.0–34.0)
MCH: 31.1 pg (ref 26.0–34.0)
MCHC: 33.9 g/dL (ref 30.0–36.0)
MCHC: 32.5 g/dL (ref 30.0–36.0)
MCV: 94.4 fL (ref 80.0–100.0)
MCV: 95.7 fL (ref 80.0–100.0)
Monocytes Absolute: 1 10*3/uL (ref 0.1–1.0)
Monocytes Absolute: 0.8 10*3/uL (ref 0.1–1.0)
Monocytes Relative: 9 %
Monocytes Relative: 7 %
Neutro Abs: 9 10*3/uL — ABNORMAL HIGH (ref 1.7–7.7)
Neutro Abs: 8.9 10*3/uL — ABNORMAL HIGH (ref 1.7–7.7)
Neutrophils Relative %: 81 %
Neutrophils Relative %: 82 %
Platelets: 305 10*3/uL (ref 150–400)
Platelets: 330 10*3/uL (ref 150–400)
RBC: 2.66 MIL/uL — ABNORMAL LOW (ref 3.87–5.11)
RBC: 2.99 MIL/uL — ABNORMAL LOW (ref 3.87–5.11)
RDW: 13.4 % (ref 11.5–15.5)
RDW: 13.6 % (ref 11.5–15.5)
WBC: 11.1 10*3/uL — ABNORMAL HIGH (ref 4.0–10.5)
WBC: 10.9 10*3/uL — ABNORMAL HIGH (ref 4.0–10.5)
nRBC: 0 % (ref 0.0–0.2)
nRBC: 0 % (ref 0.0–0.2)

## 2020-04-28 LAB — RENAL FUNCTION PANEL
Albumin: 1.9 g/dL — ABNORMAL LOW (ref 3.5–5.0)
Albumin: 2.1 g/dL — ABNORMAL LOW (ref 3.5–5.0)
Anion gap: 15 (ref 5–15)
Anion gap: 16 — ABNORMAL HIGH (ref 5–15)
BUN: 82 mg/dL — ABNORMAL HIGH (ref 8–23)
BUN: 81 mg/dL — ABNORMAL HIGH (ref 8–23)
CO2: 19 mmol/L — ABNORMAL LOW (ref 22–32)
CO2: 18 mmol/L — ABNORMAL LOW (ref 22–32)
Calcium: 8.5 mg/dL — ABNORMAL LOW (ref 8.9–10.3)
Calcium: 9 mg/dL (ref 8.9–10.3)
Chloride: 108 mmol/L (ref 98–111)
Chloride: 105 mmol/L (ref 98–111)
Creatinine, Ser: 3.8 mg/dL — ABNORMAL HIGH (ref 0.44–1.00)
Creatinine, Ser: 3.91 mg/dL — ABNORMAL HIGH (ref 0.44–1.00)
GFR calc Af Amer: 12 mL/min — ABNORMAL LOW (ref >60)
GFR calc Af Amer: 12 mL/min — ABNORMAL LOW (ref >60)
GFR calc non Af Amer: 11 mL/min — ABNORMAL LOW (ref >60)
GFR calc non Af Amer: 10 mL/min — ABNORMAL LOW (ref >60)
Glucose, Bld: 167 mg/dL — ABNORMAL HIGH (ref 70–99)
Glucose, Bld: 176 mg/dL — ABNORMAL HIGH (ref 70–99)
Phosphorus: 3.6 mg/dL (ref 2.5–4.6)
Phosphorus: 3.5 mg/dL (ref 2.5–4.6)
Potassium: 3.3 mmol/L — ABNORMAL LOW (ref 3.5–5.1)
Potassium: 3.7 mmol/L (ref 3.5–5.1)
Sodium: 142 mmol/L (ref 135–145)
Sodium: 139 mmol/L (ref 135–145)

## 2020-04-28 LAB — GLUCOSE, CAPILLARY
Glucose-Capillary: 74 mg/dL (ref 70–99)
Glucose-Capillary: 83 mg/dL (ref 70–99)
Glucose-Capillary: 177 mg/dL — ABNORMAL HIGH (ref 70–99)
Glucose-Capillary: 333 mg/dL — ABNORMAL HIGH (ref 70–99)
Glucose-Capillary: 162 mg/dL — ABNORMAL HIGH (ref 70–99)
Glucose-Capillary: 172 mg/dL — ABNORMAL HIGH (ref 70–99)

## 2020-04-28 LAB — MAGNESIUM
Magnesium: 1.8 mg/dL (ref 1.7–2.4)
Magnesium: 2.1 mg/dL (ref 1.7–2.4)

## 2020-04-28 MED ORDER — POTASSIUM CHLORIDE 10 MEQ/100ML IV SOLN
10.0000 meq | INTRAVENOUS | Status: AC
  Administered 2020-04-28 (×2): 10 meq via INTRAVENOUS
  Filled 2020-04-28 (×2): qty 100

## 2020-04-28 MED ORDER — MAGNESIUM SULFATE IN D5W 1-5 GM/100ML-% IV SOLN
1.0000 g | Freq: Once | INTRAVENOUS | Status: AC
  Administered 2020-04-28: 1 g via INTRAVENOUS
  Filled 2020-04-28: qty 100

## 2020-04-28 MED ORDER — LACTATED RINGERS IV SOLN: INTRAVENOUS | Status: DC

## 2020-04-28 MED ORDER — SODIUM BICARBONATE 650 MG PO TABS
1300.0000 mg | ORAL_TABLET | Freq: Three times a day (TID) | ORAL | Status: DC
  Administered 2020-04-28 – 2020-05-02 (×12): 1300 mg via ORAL
  Filled 2020-04-28 (×12): qty 2

## 2020-04-28 NOTE — Progress Notes (Signed)
Physical Therapy Treatment Patient Details Name: [name] MRN: [id number] DOB: [birth year] Today's Date: 04/28/2020    History of Present Illness 79 yo female with onset of AKI and AMS was brought to ED, noted atelectasis, cholelithiasis, acute encephalopathy, SIRS, and concerns about renal mass.  PMHx:  R eye macular degeneration, CVA 2019, colon CA, radiation therapy, thyroid nodule, atherosclerosis, HTN, CKD4, stroke    PT Comments    Pt was seen today to follow up after initially having quite limited gait and balance tolerance.  Her performance today is more controlled, with better endurance and better safety awareness.  Her plan is to make progress with distance walking, with reminders for safety and obstacle clearance and to focus on strengthening to increase her safety with greater attempts to walk and transfer alone.  Pt left up in chair for endurance training, nursing was updated.  Pulse was 116 at end of walk, from 102 baseline.   Follow Up Recommendations  CIR     Equipment Recommendations  None recommended by PT    Recommendations for Other Services Rehab consult     Precautions / Restrictions Precautions Precautions: Fall Precaution Comments: monitor O2 and pulses Restrictions Weight Bearing Restrictions: No    Mobility  Bed Mobility Overal bed mobility: Needs Assistance Bed Mobility: Supine to Sit     Supine to sit: Min assist     General bed mobility comments: used bed rail to support OOB  Transfers Overall transfer level: Needs assistance Equipment used: None Transfers: Sit to/from Stand Sit to Stand: Min guard Stand pivot transfers: Min assist       General transfer comment: Pt was able to walk with min guard on RW for three trips of 12'  Ambulation/Gait Ambulation/Gait assistance: Min guard Gait Distance (Feet): 36 Feet (12 x 3) Assistive device: Rolling walker (2 wheeled) Gait Pattern/deviations: Step-through pattern;Decreased stride  length Gait velocity: reduced Gait velocity interpretation: <1.31 ft/sec, indicative of household ambulator General Gait Details: Pt was able to walk with min guard on RW for three trips of 12'   Stairs             Wheelchair Mobility    Modified Rankin (Stroke Patients Only)       Balance Overall balance assessment: Needs assistance Sitting-balance support: Feet supported Sitting balance-Leahy Scale: Good     Standing balance support: Bilateral upper extremity supported Standing balance-Leahy Scale: Fair Standing balance comment: less than fair dynamically                            Cognition Arousal/Alertness: Awake/alert Behavior During Therapy: WFL for tasks assessed/performed Overall Cognitive Status: Within Functional Limits for tasks assessed                                 General Comments: able to help with limiting her mobility appropriately      Exercises      General Comments General comments (skin integrity, edema, etc.): Used BSC with help to clean up but could stand with min guard on walker to get done      Pertinent Vitals/Pain Pain Assessment: No/denies pain    Home Living                      Prior Function            PT Goals (current goals can  now be found in the care plan section) Acute Rehab PT Goals Patient Stated Goal: to get stronger and go back home Progress towards PT goals: Progressing toward goals    Frequency    Min 4X/week      PT Plan Discharge plan needs to be updated    Co-evaluation              AM-PAC PT "6 Clicks" Mobility   Outcome Measure  Help needed turning from your back to your side while in a flat bed without using bedrails?: A Little Help needed moving from lying on your back to sitting on the side of a flat bed without using bedrails?: A Little Help needed moving to and from a bed to a chair (including a wheelchair)?: A Little Help needed standing up  from a chair using your arms (e.g., wheelchair or bedside chair)?: A Little Help needed to walk in hospital room?: A Little Help needed climbing 3-5 steps with a railing? : A Lot 6 Click Score: 17    End of Session Equipment Utilized During Treatment: Gait belt Activity Tolerance: Patient limited by fatigue;Treatment limited secondary to medical complications (Comment) Patient left: in chair;with call bell/phone within reach;with chair alarm set Nurse Communication: Mobility status PT Visit Diagnosis: Unsteadiness on feet (R26.81);Muscle weakness (generalized) (M62.81);Difficulty in walking, not elsewhere classified (R26.2)     Time: 7078-6754 PT Time Calculation (min) (ACUTE ONLY): 29 min  Charges:                     Ramond Dial 04/28/2020, 5:08 PM  [name], PT MS Acute Rehab Dept. Number: [facility]

## 2020-04-28 NOTE — Progress Notes (Signed)
Patient ID: [name], female   DOB: 12/01/1940, 79 y.o.   MRN: [id number] [facility] Progress Note   Assessment/ Plan:   1.  Acute kidney injury on chronic kidney disease stage IV (basline creatinine 2.0-2.4): Underlying CKD appears likely from hypertension with hemodynamically mediated AKI from volume depletion in the setting of ongoing ACE-I/loop diuretic.  (corroborated by physical exam and low urine sodium/FeNa, bland UA).  Urine output charted is unimpressive but per RN having unmeasured voids.  Labs show stable creatinine and acidemia. CT abdomen did not show any hydronephrosis and showed complex left renal cyst.   I will continue her IVF for another 12h with her drowsiness potentially causing poor po intake. 2.  Altered mental status: Suspect multifactorial with metabolic encephalopathy associated with azotemia but need to rule out underlying infection that may also be contributing to the same.  No clear evidence of UTI and possible focus noted in the lung.  On empiric antimicrobial therapy with Zosyn with blood and urine cultures negative to date. 3.  Anion gap metabolic acidosis: Secondary to acute kidney injury, sluggish improvement- titrate PO NaHCO3. 4.  Anemia: Without overt loss.  Possibly associated with chronic kidney disease and notably with low iron saturation of 9% and a ferritin of 674.  S/P IV Fe and PRBCs as IVFs unmasked hemoconcentration and worse anemia.  Hb 9.3 today. 5.  Hypokalemia: improved to 3.7 today, monitor. 6. Hypocalcemia: likely chronic and associated with secondary hyperparathyroidism- worsened with isotonic bicarbonate infusion. Cont QHS calcium (corrected Ca is 8.4).  7.  Complex renal cyst:  outpt f/u imaging.   Subjective:   Seen in room, sleepy but answering questions.  Says appetite ok and eating and drinking normally.   Has had several unmeasured voids for various reasons - UOP 253mL documented yesterday.    Objective:   BP 130/72    Pulse 99   Temp 98.2 F (36.8 C) (Oral)   Resp 18   Wt 99.3 kg   SpO2 98%   BMI 33.79 kg/m   Intake/Output Summary (Last 24 hours) at 04/28/2020 1029 Last data filed at 04/28/2020 0827 Gross per 24 hour  Intake 921.02 ml  Output 100 ml  Net 821.02 ml   Weight change: 0.454 kg  Physical Exam: UXN:[id number] resting in bed, arousable but sleepy KYH:CWCBJ regular tachycardia, s1 and s2 with ESM Resp:Decreased breath sounds over bases without rales/rhonchi SEG:BTDV, obese, non-tender, bowel sounds normal Ext:No lower extremity edema.   Imaging: DG Abd 1 View  Result Date: 04/26/2020 CLINICAL DATA:  Altered mental status. Nausea and vomiting. Hypertension. EXAM: ABDOMEN - 1 VIEW COMPARISON:  None. FINDINGS: Nonobstructive bowel gas pattern. No radio-opaque calculi or other significant radiographic abnormality are seen. Clips project over the sacrum. Degenerative changes of the lumbar spine. Mild S-shaped thoracolumbar curvature. IMPRESSION: Nonobstructive bowel gas pattern. Electronically Signed   By: Margaretha Sheffield MD   On: 04/26/2020 11:37   DG CHEST PORT 1 VIEW  Result Date: 04/27/2020 CLINICAL DATA:  Acute encephalopathy. EXAM: PORTABLE CHEST 1 VIEW COMPARISON:  Chest radiograph and chest CT from 04/25/2020 FINDINGS: Similar cardiomediastinal silhouette. See recent CT chest for characterization of fusiform dilation of the ascending aorta. Calcific atherosclerosis of the aorta. Similar linear left basilar opacities. No visible pleural effusions or pneumothorax. No acute osseous abnormality. IMPRESSION: Similar linear left basilar opacities, characterized as atelectasis on recent CT chest. No new areas of consolidation. Electronically Signed   By: Margaretha Sheffield MD   On: 04/27/2020 11:04  ECHOCARDIOGRAM COMPLETE  Result Date: 04/27/2020    ECHOCARDIOGRAM REPORT   Patient Name:   [name] Date of Exam: 04/27/2020 Medical Rec #:  [id number]      Height:       67.5 in  Accession #:    [id number]     Weight:       218.0 lb Date of Birth:  January 06, 1941       BSA:          2.109 m Patient Age:    65 years       BP:           128/58 mmHg Patient Gender: F              HR:           107 bpm. Exam Location:  Inpatient Procedure: 2D Echo, Cardiac Doppler and Color Doppler Indications:    Dyspnea 786.09 / R06.00  History:        Patient has prior history of Echocardiogram examinations, most                 recent 03/24/2018. Stroke; Risk Factors:Hypertension, Diabetes                 and Dyslipidemia. Breast cancer ([place]) (From Hx), CKD (chronic                 kidney disease), stage IV ([place]) (From Hx).  Sonographer:    Alvino Chapel RCS Referring Phys: Leonore  1. Left ventricular ejection fraction, by estimation, is 60 to 65%. The left ventricle has normal function. The left ventricle has no regional wall motion abnormalities. Indeterminate diastolic filling due to E-A fusion.  2. Right ventricular systolic function is normal. The right ventricular size is normal. Tricuspid regurgitation signal is inadequate for assessing PA pressure.  3. The mitral valve is normal in structure. No evidence of mitral valve regurgitation. No evidence of mitral stenosis.  4. The aortic valve is tricuspid. There is moderate calcification of the aortic valve. Aortic valve regurgitation is not visualized. Mild to moderate aortic valve sclerosis/calcification is present, without any evidence of aortic stenosis.  5. The inferior vena cava is normal in size with greater than 50% respiratory variability, suggesting right atrial pressure of 3 mmHg. FINDINGS  Left Ventricle: Left ventricular ejection fraction, by estimation, is 60 to 65%. The left ventricle has normal function. The left ventricle has no regional wall motion abnormalities. The left ventricular internal cavity size was normal in size. There is  no left ventricular hypertrophy. Indeterminate diastolic filling due to E-A  fusion. Right Ventricle: The right ventricular size is normal. No increase in right ventricular wall thickness. Right ventricular systolic function is normal. Tricuspid regurgitation signal is inadequate for assessing PA pressure. Left Atrium: Left atrial size was normal in size. Right Atrium: Right atrial size was normal in size. Pericardium: There is no evidence of pericardial effusion. Mitral Valve: The mitral valve is normal in structure. No evidence of mitral valve regurgitation. No evidence of mitral valve stenosis. Tricuspid Valve: The tricuspid valve is normal in structure. Tricuspid valve regurgitation is not demonstrated. No evidence of tricuspid stenosis. Aortic Valve: The aortic valve is tricuspid. There is moderate calcification of the aortic valve. Aortic valve regurgitation is not visualized. Mild to moderate aortic valve sclerosis/calcification is present, without any evidence of aortic stenosis. Pulmonic Valve: The pulmonic valve was normal in structure. Pulmonic valve regurgitation is trivial. No evidence of  pulmonic stenosis. Aorta: The aortic root is normal in size and structure. Venous: The inferior vena cava is normal in size with greater than 50% respiratory variability, suggesting right atrial pressure of 3 mmHg. IAS/Shunts: No atrial level shunt detected by color flow Doppler.  LEFT VENTRICLE PLAX 2D LVIDd:         5.20 cm LVIDs:         3.10 cm LV PW:         0.90 cm LV IVS:        0.90 cm LVOT diam:     2.10 cm LV SV:         73 LV SV Index:   35 LVOT Area:     3.46 cm  RIGHT VENTRICLE TAPSE (M-mode): 2.0 cm LEFT ATRIUM             Index       RIGHT ATRIUM          Index LA diam:        2.90 cm 1.37 cm/m  RA Area:     8.94 cm LA Vol (A2C):   26.2 ml 12.42 ml/m RA Volume:   16.10 ml 7.63 ml/m LA Vol (A4C):   39.7 ml 18.82 ml/m LA Biplane Vol: 34.0 ml 16.12 ml/m  AORTIC VALVE LVOT Vmax:   130.00 cm/s LVOT Vmean:  86.700 cm/s LVOT VTI:    0.212 m  AORTA Ao Root diam: 3.20 cm MITRAL  VALVE MV Area (PHT): 6.54 cm     SHUNTS MV Decel Time: 116 msec     Systemic VTI:  0.21 m MV E velocity: 131.00 cm/s  Systemic Diam: 2.10 cm Fransico Him MD Electronically signed by Fransico Him MD Signature Date/Time: 04/27/2020/1:34:33 PM    Final     Labs: BMET Recent Labs  Lab 04/25/20 1639 04/25/20 1639 04/26/20 0500 04/26/20 1107 04/26/20 1635 04/27/20 0230 04/27/20 1026 04/28/20 0316 04/28/20 0808  NA 135   < > 137 136 136 147* 137 142 139  K 4.3   < > 3.7 3.4* 3.8 3.2* 3.0* 3.3* 3.7  CL 103  --  105  --  109 99 103 108 105  CO2 13*  --  11*  --  13* 13* 16* 19* 18*  GLUCOSE 126*  --  83  --  141* 109* 195* 167* 176*  BUN 93*  --  93*  --  92* 90* 92* 82* 81*  CREATININE 5.52*  --  5.29*  --  4.89* 4.40* 4.28* 3.80* 3.91*  CALCIUM 9.3  --  8.6*  --  8.3* 6.8* 8.0* 8.5* 9.0  PHOS  --   --   --   --   --  4.4 4.2 3.6 3.5   < > = values in this interval not displayed.   CBC Recent Labs  Lab 04/27/20 0230 04/27/20 1026 04/28/20 0316 04/28/20 0808  WBC 13.7* 12.9* 11.1* 10.9*  NEUTROABS 11.7* 11.2* 9.0* 8.9*  HGB 6.1* 8.2* 8.5* 9.3*  HCT 18.7* 24.2* 25.1* 28.6*  MCV 96.4 92.4 94.4 95.7  PLT 267 282 305 330    Medications:    . sodium chloride   Intravenous Once  . acetaminophen  650 mg Oral Once  . ARIPiprazole  5 mg Oral Daily  . atorvastatin  80 mg Oral q1800  . calcium carbonate  1,000 mg of elemental calcium Oral QHS  . clopidogrel  75 mg Oral Daily  . diphenhydrAMINE  25 mg Oral Once  .  furosemide  20 mg Intravenous Once  . heparin  5,000 Units Subcutaneous Q8H  . insulin aspart  0-9 Units Subcutaneous TID WC  . mirtazapine  15 mg Oral QHS  . sodium bicarbonate  650 mg Oral TID   Jannifer Hick MD [facility] Pager [phone]

## 2020-04-28 NOTE — Progress Notes (Signed)
PROGRESS NOTE    [name]  ZCH:[id number] DOB: [birth year] DOA: 04/25/2020 PCP: Crist Infante, MD  Outpatient Specialists:   Brief Narrative:  [name] is a 79 y.o. female with medical history significant for type 2 diabetes, HTN, HLD, CKD stage IV, history of CVA, and depression/anxiety.  Patient is admitted with acute encephalopathy, SIRS, acute kidney injury on chronic kidney disease stage IV likely secondary to combined volume depletion and possible ATN, cholelithiasis with cholecystic fluid.  Apparently, patient's problem started about 3 to 4 days ago with altered mentation, associated nausea, vomiting and loose stools.  Systolic blood pressure of 70 mmHg was documented prior to admission.  Patient's baseline serum creatinine is 1.97.  Serum creatinine today is 5.3, with BUN of 93.  CO2 is 11 with lactic acid of 1.2.  Systolic blood pressure has improved to 121 mmHg with hydration, tachycardia is improving to low 100 (106 bpm), mild improvement in leukocytosis.  Imaging studies done so far is negative for acute ischemic event, CT chest revealed 4 cm ascending aortic aneurysm and 2.4cm left thyroid nodule.  As documented above, cholelithiasis with pericholecystic fluid is documented.  Discussed with the patient's son, local orthopedic surgeon, Phylliss Bob.  Gaseous abdominal distention is noted, but patient son feels this may be normal for patient.  Nephrology team has been consulted.  We will have a low threshold to start patient on antibiotics while cultures are pending.  We will proceed with HIDA scan.  We will get an ABG, considering significantly low CO2, and will likely change patient's IV fluids to sterile water plus bicarb.  Prognosis is guarded.  Patient remains a full code.  Further management will depend on hospital course.  04/27/2020: Patient seen alongside patient's son, Dr. Lynann Bologna.  Patient looks a lot better today.  Encephalopathy has improved significantly.  No fever or  chills documented.  Intermittent tachycardia is noted.  PVCs noted.  Nonsustained VT is reported.  Cardiology team has been consulted.  Last echocardiogram was in 2019.  Potassium level was 3.2, corrected with KCl 40 M EQ p.o. x1 dose.  Magnesium level was 1.7.  Revealed WBC of 13.7, hemoglobin of 6.1, platelet count of 267.  2 units of packed red blood cells have been ordered, however, will repeat patient's CBC prior to finalizing the decision to transfuse patient with packed red blood cells (this was also discussed with the patient's son).  We will get repeat chest x-ray as minimal expiratory wheezing is noted at the right lung field anteriorly.  Nephrology input is appreciated.  Will need strict I's and O's.  Stool analysis is still pending, however, the diarrhea seems to be improving.  04/28/2020: Patient seen.  Patient is a bit sleepy today but still able to provide some history.  Slight improvement in AKI is noted.  I's and O's not adequately documented.  We will continue IV fluids for now.  Continue to monitor and correct abnormal electrolytes.  Nephrology and cardiology to appreciated.  PT and OT have recommended CIR.  CRI team consulted.  Assessment & Plan:   Principal Problem:   Acute kidney injury superimposed on CKD ([place]) Active Problems:   Hypertension associated with diabetes ([place])   Type 2 diabetes mellitus ([place])   Hyperlipidemia associated with type 2 diabetes mellitus ([place])   History of CVA (cerebrovascular accident)   Depression with anxiety   Ascending aorta dilatation ([facility])   Left thyroid nodule   Right renal mass   Acute encephalopathy   Acute  encephalopathy/SIRS: -See above documentation. -Follow cultures. -Continue with IV hydration. -Low threshold to start antibiotics. -Nephrology team consulted for acute kidney injury. -Guarded prognosis. 04/28/2020: Encephalopathy is improving.  Patient is able to provide some history, though, slowed.   No fever or chills.   Leukocytosis continues to improve.   -Urine and blood cultures have come back negative.  AKI on CKD stage IV: Creatinine 5.52 on admission compared to previous of 1.97 two years ago.   See above documentation.   AKI could be secondary to combined hypovolemia and ATN.   Change IV fluids to sterile water plus bicarb.   -Nephrology team consulted.   -We defer further care work-up and management of AKI to the renal team.   -Continue to hold Lasix and lisinopril -Avoid NSAIDs and nephrotoxic agents -Dose all medications assuming GFR of less than 10 mils per minute 1.73 m. 04/27/2020: BMP done earlier today revealed sodium of 147, potassium of 3.2, CO2 of 13, BUN of 90 and serum creatinine of four-point Fawze blood sugar of 109.  Magnesium is 1.7.  Albumin is 2.  Anion gap is reported to be 35.  Will repeat BMP.  Calcium is 6.8, however, corrected calcium level will be 8.4.  Will defer to the nephrology team. 04/28/2020: Renal function continues to improve.  Nephrology team is directing care.  Cholelithiasis: Cholelithiasis with mild amount of pericholecystic fluid seen on RUQ ultrasound without evidence of acute cholecystitis.   Patient does not have any abdominal pain.  LFTs mildly elevated.   We will have low threshold to proceed with HIDA scan.   04/27/2020: No abdominal pain reported.  Discussed with patient's son, no HIDA scan for now.  Frequent PVCs/nonsustained V. Tach: -Optimize electrolytes. -Cardiology consulted. -Blood pressure to proceed with echocardiogram, however, will defer to the cardiology team. -Repeat CBC and BMP. -For possible blood transfusion if hemoglobin remains concerning. 04/28/2020: Continue to optimize electrolytes.  Cardiology input is appreciated.  Type 2 diabetes: Place on sensitive SSI while in hospital.  Hypertension: Hold home lisinopril and Lasix due to soft BP on admission and AKI as above. 04/27/2020: Reasonably controlled.  Continue to monitor  closely.  History of CVA: Continue Plavix and atorvastatin.  Hyperlipidemia: Continue atorvastatin.  Anemia: -Anemia of chronic kidney disease. -Drop in hemoglobin noted is likely secondary to IV hydration. -Further management by nephrology. -Patient will need iron panel and possible ESA, however, patient is noted to have complex renal mass.   04/27/2020: Repeat hemoglobin was 8.2 g/dL.  Blood transfusion was held.  Patient son updated. 04/28/2020: Hemoglobin has remained stable.   Left thyroid nodule: 2.4 cm left thyroid nodule seen on CT imaging.  Consider follow-up thyroid ultrasound.  TSH pending.  Further management on outpatient basis.  Dilatation of ascending aorta: Seen on CT imaging with maximal dimension 4 cm.  Annual follow-up by CTA or MRA recommended. Further management on outpatient basis.  Consider referral to CT surgeon on discharge.  Complex right renal mass: Seen on abdominal ultrasound. May be renal cyst seen on prior renal ultrasound however correlation with contrast-enhanced CT abdomen/pelvis was recommended.  Cannot obtain contrast imaging at this time due to AKI. Will need referral to urology when patient is stabilized.  Depression/anxiety: Continue Abilify and Remeron.  Continue chronic home Ativan 0.5 mg q6h as needed with hold parameters.  04/26/2020: We minimize mind altering medications.  We will also adjust medications as per renal function.  Generalized weakness: PT/OT evaluation when patient is more stable.  DVT prophylaxis: Subcutaneous heparin Code Status: Full code (discussed CODE STATUS with patient's son). Family Communication: Patient's son, Latice Waitman. Disposition Plan: This will depend on hospital course   Consultants:   Nephrology.  Procedures:   None  Antimicrobials:   We have a low threshold to start patient on IV antibiotics (with dose as per renal function).  Will assume GFR of less than 10 mils per minute per  1.73 m.   Subjective: Patient seen  Sleepy today. Still able to provide some history. No fever or chills.  Objective: Vitals:   04/28/20 0830 04/28/20 1108 04/28/20 1150 04/28/20 1710  BP: 130/72  132/78 130/73  Pulse: 99 (!) 111 95 (!) 108  Resp: 18  19 18   Temp: 98.2 F (36.8 C)  98 F (36.7 C) 98.8 F (37.1 C)  TempSrc: Oral  Oral Oral  SpO2:  94% 93% 97%  Weight:        Intake/Output Summary (Last 24 hours) at 04/28/2020 1943 Last data filed at 04/28/2020 0827 Gross per 24 hour  Intake 300 ml  Output --  Net 300 ml   Filed Weights   04/27/20 0356 04/28/20 0501  Weight: 98.9 kg 99.3 kg    Examination:  General exam: Appears calm and comfortable, but sleepy.  Dry buccal membrane. Respiratory system: Clear to auscultation. Cardiovascular system: S1 & S2 with missed beats.   Gastrointestinal system: Abdomen is obese/gaseously distended, soft and nontender. No organomegaly or masses felt. Normal bowel sounds heard. Central nervous system: Alert and oriented. No focal neurological deficits. Extremities: No edema.  Data Reviewed: I have personally reviewed following labs and imaging studies  CBC: Recent Labs  Lab 04/25/20 1639 04/25/20 1639 04/26/20 0500 04/26/20 0500 04/26/20 1107 04/27/20 0230 04/27/20 1026 04/28/20 0316 04/28/20 0808  WBC 16.5*   < > 15.0*  --   --  13.7* 12.9* 11.1* 10.9*  NEUTROABS 14.5*  --   --   --   --  11.7* 11.2* 9.0* 8.9*  HGB 9.5*   < > 8.6*   < > 8.5* 6.1* 8.2* 8.5* 9.3*  HCT 29.3*   < > 26.3*   < > 25.0* 18.7* 24.2* 25.1* 28.6*  MCV 99.7   < > 98.9  --   --  96.4 92.4 94.4 95.7  PLT 293   < > 306  --   --  267 282 305 330   < > = values in this interval not displayed.   Basic Metabolic Panel: Recent Labs  Lab 04/26/20 1635 04/27/20 0230 04/27/20 1026 04/28/20 0316 04/28/20 0808  NA 136 147* 137 142 139  K 3.8 3.2* 3.0* 3.3* 3.7  CL 109 99 103 108 105  CO2 13* 13* 16* 19* 18*  GLUCOSE 141* 109* 195* 167* 176*   BUN 92* 90* 92* 82* 81*  CREATININE 4.89* 4.40* 4.28* 3.80* 3.91*  CALCIUM 8.3* 6.8* 8.0* 8.5* 9.0  MG  --  1.7 2.0 1.8 2.1  PHOS  --  4.4 4.2 3.6 3.5   GFR: CrCl cannot be calculated (Unknown ideal weight.). Liver Function Tests: Recent Labs  Lab 04/25/20 1639 04/25/20 1639 04/26/20 0500 04/27/20 0230 04/27/20 1026 04/28/20 0316 04/28/20 0808  AST 65*  --  76*  --   --   --   --   ALT 49*  --  53*  --   --   --   --   ALKPHOS 123  --  132*  --   --   --   --  BILITOT 1.3*  --  1.1  --   --   --   --   PROT 6.7  --  6.4*  --   --   --   --   ALBUMIN 2.7*   < > 2.5* 2.0* 2.0* 1.9* 2.1*   < > = values in this interval not displayed.   Recent Labs  Lab 04/26/20 0500  LIPASE 53*   No results for input(s): AMMONIA in the last 168 hours. Coagulation Profile: No results for input(s): INR, PROTIME in the last 168 hours. Cardiac Enzymes: No results for input(s): CKTOTAL, CKMB, CKMBINDEX, TROPONINI in the last 168 hours. BNP (last 3 results) No results for input(s): PROBNP in the last 8760 hours. HbA1C: Recent Labs    04/26/20 0500  HGBA1C 7.8*   CBG: Recent Labs  Lab 04/27/20 1642 04/27/20 2051 04/28/20 0806 04/28/20 1130 04/28/20 1705  GLUCAP 158* 162* 177* 333* 162*   Lipid Profile: No results for input(s): CHOL, HDL, LDLCALC, TRIG, CHOLHDL, LDLDIRECT in the last 72 hours. Thyroid Function Tests: Recent Labs    04/26/20 0500  TSH 1.691   Anemia Panel: Recent Labs    04/26/20 0500  VITAMINB12 427  FOLATE 38.8  FERRITIN 674*  TIBC 185*  IRON 16*   Urine analysis:    Component Value Date/Time   COLORURINE AMBER (A) 04/25/2020 2212   APPEARANCEUR CLOUDY (A) 04/25/2020 2212   LABSPEC 1.015 04/25/2020 2212   PHURINE 5.0 04/25/2020 2212   GLUCOSEU NEGATIVE 04/25/2020 2212   HGBUR NEGATIVE 04/25/2020 2212   BILIRUBINUR NEGATIVE 04/25/2020 2212   KETONESUR NEGATIVE 04/25/2020 2212   PROTEINUR 100 (A) 04/25/2020 2212   NITRITE NEGATIVE 04/25/2020  2212   LEUKOCYTESUR NEGATIVE 04/25/2020 2212   Sepsis Labs: @LABRCNTIP (procalcitonin:4,lacticidven:4)  ) Recent Results (from the past 240 hour(s))  Culture, blood (routine x 2)     Status: None (Preliminary result)   Collection Time: 04/25/20  5:07 PM   Specimen: BLOOD  Result Value Ref Range Status   Specimen Description BLOOD LEFT ANTECUBITAL  Final   Special Requests   Final    BOTTLES DRAWN AEROBIC AND ANAEROBIC Blood Culture adequate volume   Culture   Final    NO GROWTH 3 DAYS Performed at [facility], [place] [address], [place], [place] [address]    Report Status PENDING  Incomplete  Respiratory Panel by RT PCR (Flu A&B, Covid) - Nasopharyngeal Swab     Status: None   Collection Time: 04/25/20  7:28 PM   Specimen: Nasopharyngeal Swab  Result Value Ref Range Status   SARS Coronavirus 2 by RT PCR NEGATIVE NEGATIVE Final    Comment: (NOTE) SARS-CoV-2 target nucleic acids are NOT DETECTED.  The SARS-CoV-2 RNA is generally detectable in upper respiratoy specimens during the acute phase of infection. The lowest concentration of SARS-CoV-2 viral copies this assay can detect is 131 copies/mL. A negative result does not preclude SARS-Cov-2 infection and should not be used as the sole basis for treatment or other patient management decisions. A negative result may occur with  improper specimen collection/handling, submission of specimen other than nasopharyngeal swab, presence of viral mutation(s) within the areas targeted by this assay, and inadequate number of viral copies (<131 copies/mL). A negative result must be combined with clinical observations, patient history, and epidemiological information. The expected result is Negative.  Fact Sheet for Patients:  PinkCheek.be  Fact Sheet for Healthcare Providers:  GravelBags.it  This test is no t yet approved or  cleared by the [place] and  has been  authorized for detection and/or diagnosis of SARS-CoV-2 by FDA under an Emergency Use Authorization (EUA). This EUA will remain  in effect (meaning this test can be used) for the duration of the COVID-19 declaration under Section 564(b)(1) of the Act, 21 U.S.C. section 360bbb-3(b)(1), unless the authorization is terminated or revoked sooner.     Influenza A by PCR NEGATIVE NEGATIVE Final   Influenza B by PCR NEGATIVE NEGATIVE Final    Comment: (NOTE) The Xpert Xpress SARS-CoV-2/FLU/RSV assay is intended as an aid in  the diagnosis of influenza from Nasopharyngeal swab specimens and  should not be used as a sole basis for treatment. Nasal washings and  aspirates are unacceptable for Xpert Xpress SARS-CoV-2/FLU/RSV  testing.  Fact Sheet for Patients: PinkCheek.be  Fact Sheet for Healthcare Providers: GravelBags.it  This test is not yet approved or cleared by the [place] FDA and  has been authorized for detection and/or diagnosis of SARS-CoV-2 by  FDA under an Emergency Use Authorization (EUA). This EUA will remain  in effect (meaning this test can be used) for the duration of the  Covid-19 declaration under Section 564(b)(1) of the Act, 21  U.S.C. section 360bbb-3(b)(1), unless the authorization is  terminated or revoked. Performed at [facility], [place] [address] [place], [place] [address]   Culture, blood (routine x 2)     Status: None (Preliminary result)   Collection Time: 04/25/20  7:29 PM   Specimen: BLOOD  Result Value Ref Range Status   Specimen Description BLOOD SITE NOT SPECIFIED  Final   Special Requests   Final    BOTTLES DRAWN AEROBIC AND ANAEROBIC Blood Culture results may not be optimal due to an inadequate volume of blood received in culture bottles   Culture   Final    NO GROWTH 3 DAYS Performed at [facility], [place] [address], [place], [place] [address]    Report Status PENDING   Incomplete  Urine culture     Status: None   Collection Time: 04/25/20 10:00 PM   Specimen: Urine, Random  Result Value Ref Range Status   Specimen Description URINE, RANDOM  Final   Special Requests NONE  Final   Culture   Final    NO GROWTH Performed at [facility], [address] [place], [place] [address]    Report Status 04/27/2020 FINAL  Final         Radiology Studies: DG CHEST PORT 1 VIEW  Result Date: 04/27/2020 CLINICAL DATA:  Acute encephalopathy. EXAM: PORTABLE CHEST 1 VIEW COMPARISON:  Chest radiograph and chest CT from 04/25/2020 FINDINGS: Similar cardiomediastinal silhouette. See recent CT chest for characterization of fusiform dilation of the ascending aorta. Calcific atherosclerosis of the aorta. Similar linear left basilar opacities. No visible pleural effusions or pneumothorax. No acute osseous abnormality. IMPRESSION: Similar linear left basilar opacities, characterized as atelectasis on recent CT chest. No new areas of consolidation. Electronically Signed   By: Margaretha Sheffield MD   On: 04/27/2020 11:04   ECHOCARDIOGRAM COMPLETE  Result Date: 04/27/2020    ECHOCARDIOGRAM REPORT   Patient Name:   [name] Date of Exam: 04/27/2020 Medical Rec #:  [id number]      Height:       67.5 in Accession #:    [id number]     Weight:       218.0 lb Date of Birth:  06/09/1941  BSA:          2.109 m Patient Age:    58 years       BP:           128/58 mmHg Patient Gender: F              HR:           107 bpm. Exam Location:  Inpatient Procedure: 2D Echo, Cardiac Doppler and Color Doppler Indications:    Dyspnea 786.09 / R06.00  History:        Patient has prior history of Echocardiogram examinations, most                 recent 03/24/2018. Stroke; Risk Factors:Hypertension, Diabetes                 and Dyslipidemia. Breast cancer ([place]) (From Hx), CKD (chronic                 kidney disease), stage IV ([place]) (From Hx).  Sonographer:    Alvino Chapel RCS Referring Phys: [place]  1. Left ventricular ejection fraction, by estimation, is 60 to 65%. The left ventricle has normal function. The left ventricle has no regional wall motion abnormalities. Indeterminate diastolic filling due to E-A fusion.  2. Right ventricular systolic function is normal. The right ventricular size is normal. Tricuspid regurgitation signal is inadequate for assessing PA pressure.  3. The mitral valve is normal in structure. No evidence of mitral valve regurgitation. No evidence of mitral stenosis.  4. The aortic valve is tricuspid. There is moderate calcification of the aortic valve. Aortic valve regurgitation is not visualized. Mild to moderate aortic valve sclerosis/calcification is present, without any evidence of aortic stenosis.  5. The inferior vena cava is normal in size with greater than 50% respiratory variability, suggesting right atrial pressure of 3 mmHg. FINDINGS  Left Ventricle: Left ventricular ejection fraction, by estimation, is 60 to 65%. The left ventricle has normal function. The left ventricle has no regional wall motion abnormalities. The left ventricular internal cavity size was normal in size. There is  no left ventricular hypertrophy. Indeterminate diastolic filling due to E-A fusion. Right Ventricle: The right ventricular size is normal. No increase in right ventricular wall thickness. Right ventricular systolic function is normal. Tricuspid regurgitation signal is inadequate for assessing PA pressure. Left Atrium: Left atrial size was normal in size. Right Atrium: Right atrial size was normal in size. Pericardium: There is no evidence of pericardial effusion. Mitral Valve: The mitral valve is normal in structure. No evidence of mitral valve regurgitation. No evidence of mitral valve stenosis. Tricuspid Valve: The tricuspid valve is normal in structure. Tricuspid valve regurgitation is not demonstrated. No evidence of tricuspid stenosis. Aortic Valve:  The aortic valve is tricuspid. There is moderate calcification of the aortic valve. Aortic valve regurgitation is not visualized. Mild to moderate aortic valve sclerosis/calcification is present, without any evidence of aortic stenosis. Pulmonic Valve: The pulmonic valve was normal in structure. Pulmonic valve regurgitation is trivial. No evidence of pulmonic stenosis. Aorta: The aortic root is normal in size and structure. Venous: The inferior vena cava is normal in size with greater than 50% respiratory variability, suggesting right atrial pressure of 3 mmHg. IAS/Shunts: No atrial level shunt detected by color flow Doppler.  LEFT VENTRICLE PLAX 2D LVIDd:         5.20 cm LVIDs:         3.10 cm LV  PW:         0.90 cm LV IVS:        0.90 cm LVOT diam:     2.10 cm LV SV:         73 LV SV Index:   35 LVOT Area:     3.46 cm  RIGHT VENTRICLE TAPSE (M-mode): 2.0 cm LEFT ATRIUM             Index       RIGHT ATRIUM          Index LA diam:        2.90 cm 1.37 cm/m  RA Area:     8.94 cm LA Vol (A2C):   26.2 ml 12.42 ml/m RA Volume:   16.10 ml 7.63 ml/m LA Vol (A4C):   39.7 ml 18.82 ml/m LA Biplane Vol: 34.0 ml 16.12 ml/m  AORTIC VALVE LVOT Vmax:   130.00 cm/s LVOT Vmean:  86.700 cm/s LVOT VTI:    0.212 m  AORTA Ao Root diam: 3.20 cm MITRAL VALVE MV Area (PHT): 6.54 cm     SHUNTS MV Decel Time: 116 msec     Systemic VTI:  0.21 m MV E velocity: 131.00 cm/s  Systemic Diam: 2.10 cm Fransico Him MD Electronically signed by Fransico Him MD Signature Date/Time: 04/27/2020/1:34:33 PM    Final         Scheduled Meds: . sodium chloride   Intravenous Once  . acetaminophen  650 mg Oral Once  . ARIPiprazole  5 mg Oral Daily  . atorvastatin  80 mg Oral q1800  . calcium carbonate  1,000 mg of elemental calcium Oral QHS  . clopidogrel  75 mg Oral Daily  . diphenhydrAMINE  25 mg Oral Once  . furosemide  20 mg Intravenous Once  . heparin  5,000 Units Subcutaneous Q8H  . insulin aspart  0-9 Units Subcutaneous TID WC  .  mirtazapine  15 mg Oral QHS  . sodium bicarbonate  1,300 mg Oral TID   Continuous Infusions: . ferumoxytol Stopped (04/26/20 1355)  . lactated ringers 75 mL/hr at 04/28/20 1411  . lactated ringers    . piperacillin-tazobactam (ZOSYN)  IV 2.25 g (04/28/20 1141)     LOS: 2 days    Time spent: Greater than 35 minutes.    Dana Allan, MD  [facility] Pager #: [phone] 7PM-7AM contact night coverage as above

## 2020-04-28 NOTE — Evaluation (Signed)
Occupational Therapy Evaluation Patient Details Name: [name] MRN: [id number] DOB: 01-28-1941 Today's Date: 04/28/2020    History of Present Illness 79 yo female with onset of AKI and AMS was brought to ED, noted atelectasis, cholelithiasis, acute encephalopathy, SIRS, and concerns about renal mass.  PMHx:  R eye macular degeneration, CVA 2019, colon CA, radiation therapy, thyroid nodule, atherosclerosis, HTN, CKD4, stroke   Clinical Impression   This 79 yo female admitted with above presents to acute OT with PLOF of being mod I to independent with basic ADLs and most IADLs without an AD, lives alone, and drives. Currently pt is setup/S-min A for all basic ADLs. Pt would benefit from acute OT with follow up on CIR--feel she can get back to her PLOF on his venue. We will continue to follow.    Follow Up Recommendations  CIR;Supervision - Intermittent    Equipment Recommendations  Other (comment) (TBD next venue)       Precautions / Restrictions Precautions Precautions: Fall Precaution Comments: incontinent (stool today, has been urine) Restrictions Weight Bearing Restrictions: No      Mobility Bed Mobility               General bed mobility comments: Pt up in recliner upon my arrival  Transfers Overall transfer level: Needs assistance Equipment used: None Transfers: Sit to/from Stand;Stand Pivot Transfers Sit to Stand: Min guard Stand pivot transfers: Min assist       General transfer comment: Pt ambulated from recliner to end of bed (5 feet) with min guard A and said she was getting tired (I could tell by her change in step pattern), she then backed up to recliner with min A    Balance Overall balance assessment: Needs assistance Sitting-balance support: No upper extremity supported;Feet supported Sitting balance-Leahy Scale: Good     Standing balance support: Single extremity supported Standing balance-Leahy Scale: Poor Standing balance comment: one hand  on surface at all times                           ADL either performed or assessed with clinical judgement   ADL Overall ADL's : Needs assistance/impaired Eating/Feeding: Independent;Sitting   Grooming: Set up;Sitting   Upper Body Bathing: Set up;Sitting   Lower Body Bathing: Min guard;Sit to/from stand   Upper Body Dressing : Set up;Sitting   Lower Body Dressing: Min guard;Sit to/from stand   Toilet Transfer: Minimal assistance;Ambulation Toilet Transfer Details (indicate cue type and reason): No AD Toileting- Clothing Manipulation and Hygiene: Maximal assistance Toileting - Clothing Manipulation Details (indicate cue type and reason): due to incontience, Min guard A sit<>stand             Vision Patient Visual Report: No change from baseline              Pertinent Vitals/Pain Pain Assessment: No/denies pain     Hand Dominance Right   Extremity/Trunk Assessment Upper Extremity Assessment Upper Extremity Assessment: Overall WFL for tasks assessed           Communication Communication Communication: No difficulties   Cognition Arousal/Alertness: Awake/alert Behavior During Therapy: WFL for tasks assessed/performed Overall Cognitive Status: Within Functional Limits for tasks assessed  Home Living Family/patient expects to be discharged to:: Skilled nursing facility Living Arrangements: Alone Available Help at Discharge: Family;Available PRN/intermittently               Bathroom Shower/Tub: Tub/shower unit;Curtain   Bathroom Toilet: Standard     Home Equipment: Cane - single point;Grab bars - tub/shower;Hand held shower head;Shower seat;Grab bars - toilet          Prior Functioning/Environment Level of Independence: Independent with assistive device(s)  Gait / Transfers Assistance Needed: Independent in house, used [facility] outside of house ADL's / [place]  Needed: Independent/Mod I   Comments: driving, doing her own basic ADLs and some IADLs, 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        OT Problem List: Decreased strength;Decreased activity tolerance;Impaired balance (sitting and/or standing)      OT Treatment/Interventions: Self-care/ADL training;DME and/or AE instruction;Patient/family education;Balance training    OT Goals(Current goals can be found in the care plan section) Acute Rehab OT Goals Patient Stated Goal: to get stronger and go back home OT Goal Formulation: With patient Time For Goal Achievement: 05/12/20 Potential to Achieve Goals: Good  OT Frequency: Min 2X/week              AM-PAC OT "6 Clicks" Daily Activity     Outcome Measure Help from another person eating meals?: None Help from another person taking care of personal grooming?: A Little Help from another person toileting, which includes using toliet, bedpan, or urinal?: A Little Help from another person bathing (including washing, rinsing, drying)?: A Little Help from another person to put on and taking off regular upper body clothing?: A Little Help from another person to put on and taking off regular lower body clothing?: A Little 6 Click Score: 19   End of Session Equipment Utilized During Treatment:  (pushing iv pole) Nurse Communication:  (pt incontient of stool)  Activity Tolerance: Patient limited by fatigue Patient left: in chair;with call bell/phone within reach;with chair alarm set  OT Visit Diagnosis: Unsteadiness on feet (R26.81);Muscle weakness (generalized) (M62.81)                Time: 5638-9373 OT Time Calculation (min): 30 min Charges:  OT General Charges $OT Visit: 1 Visit OT Evaluation $OT Eval Moderate Complexity: 1 Mod OT Treatments $Self Care/Home Management : 8-22 mins  [place], OTR/L Acute [employer] Pager [phone] Office [phone]     Almon Register 04/28/2020, 9:25 AM

## 2020-04-28 NOTE — Progress Notes (Signed)
Called by Ms. Zito's son, who is a Research scientist (physical sciences), as he is questioning if antibiotic is needed at all as patient has no signs of infection. She had blood cultures at admit that are negative at 3 days and urine culture is negative.  Clinically she is improving and family does not want her to receive unnecessary antibiotics. Kidney function is improving with IVF hydration and nephrology following patient.  Zosyn stopped at this time as no clinical indication to continue at this point.

## 2020-04-28 NOTE — Progress Notes (Signed)
Progress Note  Patient Name: [name] Date of Encounter: 04/28/2020  [facility] Cardiologist: Elouise Munroe, MD   Subjective   Resting comfortably in bed.  No chest pain.  Inpatient Medications    Scheduled Meds:  sodium chloride   Intravenous Once   acetaminophen  650 mg Oral Once   ARIPiprazole  5 mg Oral Daily   atorvastatin  80 mg Oral q1800   calcium carbonate  1,000 mg of elemental calcium Oral QHS   clopidogrel  75 mg Oral Daily   diphenhydrAMINE  25 mg Oral Once   furosemide  20 mg Intravenous Once   heparin  5,000 Units Subcutaneous Q8H   insulin aspart  0-9 Units Subcutaneous TID WC   mirtazapine  15 mg Oral QHS   sodium bicarbonate  650 mg Oral TID   Continuous Infusions:  ferumoxytol Stopped (04/26/20 1355)   piperacillin-tazobactam (ZOSYN)  IV 2.25 g (04/28/20 0458)   PRN Meds: acetaminophen **OR** acetaminophen, LORazepam, ondansetron **OR** ondansetron (ZOFRAN) IV   Vital Signs    Vitals:   04/27/20 1523 04/27/20 1922 04/27/20 2352 04/28/20 0501  BP: 117/74 (!) 148/42 140/72   Pulse: (!) 107 (!) 122 (!) 112   Resp: 18 (!) 22 20   Temp: 98.5 F (36.9 C) 98.6 F (37 C) 98.8 F (37.1 C) 99 F (37.2 C)  TempSrc: Oral Oral Oral Oral  SpO2: 98% 98%    Weight:    99.3 kg    Intake/Output Summary (Last 24 hours) at 04/28/2020 0821 Last data filed at 04/27/2020 1500 Gross per 24 hour  Intake 801.02 ml  Output 250 ml  Net 551.02 ml   Last 3 Weights 04/28/2020 04/27/2020 10/12/2019  Weight (lbs) 219 lb 218 lb 191 lb  Weight (kg) 99.338 kg 98.884 kg 86.637 kg      Telemetry    Occasional PVC- Personally Reviewed  ECG    No new- Personally Reviewed  Physical Exam   GEN: No acute distress.   Neck: No JVD Cardiac: RRR, no murmurs, rubs, or gallops.  Respiratory: Clear to auscultation bilaterally. GI: Soft, nontender, non-distended  MS: No edema; No deformity. Neuro:  Nonfocal  Psych: Normal affect   Labs      High Sensitivity Troponin:  No results for input(s): TROPONINIHS in the last 720 hours.    Chemistry Recent Labs  Lab 04/25/20 1639 04/25/20 1639 04/26/20 0500 04/26/20 1107 04/27/20 0230 04/27/20 1026 04/28/20 0316  NA 135   < > 137   < > 147* 137 142  K 4.3   < > 3.7   < > 3.2* 3.0* 3.3*  CL 103   < > 105   < > 99 103 108  CO2 13*   < > 11*   < > 13* 16* 19*  GLUCOSE 126*   < > 83   < > 109* 195* 167*  BUN 93*   < > 93*   < > 90* 92* 82*  CREATININE 5.52*   < > 5.29*   < > 4.40* 4.28* 3.80*  CALCIUM 9.3   < > 8.6*   < > 6.8* 8.0* 8.5*  PROT 6.7  --  6.4*  --   --   --   --   ALBUMIN 2.7*   < > 2.5*  --  2.0* 2.0* 1.9*  AST 65*  --  76*  --   --   --   --   ALT 49*  --  53*  --   --   --   --   ALKPHOS 123  --  132*  --   --   --   --   BILITOT 1.3*  --  1.1  --   --   --   --   GFRNONAA 7*   < > 7*   < > 9* 9* 11*  GFRAA 8*   < > 8*   < > 10* 11* 12*  ANIONGAP 19*   < > 21*   < > 35* 18* 15   < > = values in this interval not displayed.     Hematology Recent Labs  Lab 04/27/20 0230 04/27/20 1026 04/28/20 0316  WBC 13.7* 12.9* 11.1*  RBC 1.94* 2.62* 2.66*  HGB 6.1* 8.2* 8.5*  HCT 18.7* 24.2* 25.1*  MCV 96.4 92.4 94.4  MCH 31.4 31.3 32.0  MCHC 32.6 33.9 33.9  RDW 13.4 13.3 13.4  PLT 267 282 305    BNPNo results for input(s): BNP, PROBNP in the last 168 hours.   DDimer No results for input(s): DDIMER in the last 168 hours.   Radiology    DG Abd 1 View  Result Date: 04/26/2020 CLINICAL DATA:  Altered mental status. Nausea and vomiting. Hypertension. EXAM: ABDOMEN - 1 VIEW COMPARISON:  None. FINDINGS: Nonobstructive bowel gas pattern. No radio-opaque calculi or other significant radiographic abnormality are seen. Clips project over the sacrum. Degenerative changes of the lumbar spine. Mild S-shaped thoracolumbar curvature. IMPRESSION: Nonobstructive bowel gas pattern. Electronically Signed   By: Margaretha Sheffield MD   On: 04/26/2020 11:37   DG CHEST PORT 1  VIEW  Result Date: 04/27/2020 CLINICAL DATA:  Acute encephalopathy. EXAM: PORTABLE CHEST 1 VIEW COMPARISON:  Chest radiograph and chest CT from 04/25/2020 FINDINGS: Similar cardiomediastinal silhouette. See recent CT chest for characterization of fusiform dilation of the ascending aorta. Calcific atherosclerosis of the aorta. Similar linear left basilar opacities. No visible pleural effusions or pneumothorax. No acute osseous abnormality. IMPRESSION: Similar linear left basilar opacities, characterized as atelectasis on recent CT chest. No new areas of consolidation. Electronically Signed   By: Margaretha Sheffield MD   On: 04/27/2020 11:04   ECHOCARDIOGRAM COMPLETE  Result Date: 04/27/2020    ECHOCARDIOGRAM REPORT   Patient Name:   [name] Date of Exam: 04/27/2020 Medical Rec #:  [id number]      Height:       67.5 in Accession #:    [id number]     Weight:       218.0 lb Date of Birth:  Mar 16, 1941       BSA:          2.109 m Patient Age:    79 years       BP:           128/58 mmHg Patient Gender: F              HR:           107 bpm. Exam Location:  Inpatient Procedure: 2D Echo, Cardiac Doppler and Color Doppler Indications:    Dyspnea 786.09 / R06.00  History:        Patient has prior history of Echocardiogram examinations, most                 recent 03/24/2018. Stroke; Risk Factors:Hypertension, Diabetes                 and Dyslipidemia. Breast cancer ([place]) (From Hx), CKD (chronic  kidney disease), stage IV ([place]) (From Hx).  Sonographer:    Alvino Chapel RCS Referring Phys: [place]  1. Left ventricular ejection fraction, by estimation, is 60 to 65%. The left ventricle has normal function. The left ventricle has no regional wall motion abnormalities. Indeterminate diastolic filling due to E-A fusion.  2. Right ventricular systolic function is normal. The right ventricular size is normal. Tricuspid regurgitation signal is inadequate for assessing PA pressure.   3. The mitral valve is normal in structure. No evidence of mitral valve regurgitation. No evidence of mitral stenosis.  4. The aortic valve is tricuspid. There is moderate calcification of the aortic valve. Aortic valve regurgitation is not visualized. Mild to moderate aortic valve sclerosis/calcification is present, without any evidence of aortic stenosis.  5. The inferior vena cava is normal in size with greater than 50% respiratory variability, suggesting right atrial pressure of 3 mmHg. FINDINGS  Left Ventricle: Left ventricular ejection fraction, by estimation, is 60 to 65%. The left ventricle has normal function. The left ventricle has no regional wall motion abnormalities. The left ventricular internal cavity size was normal in size. There is  no left ventricular hypertrophy. Indeterminate diastolic filling due to E-A fusion. Right Ventricle: The right ventricular size is normal. No increase in right ventricular wall thickness. Right ventricular systolic function is normal. Tricuspid regurgitation signal is inadequate for assessing PA pressure. Left Atrium: Left atrial size was normal in size. Right Atrium: Right atrial size was normal in size. Pericardium: There is no evidence of pericardial effusion. Mitral Valve: The mitral valve is normal in structure. No evidence of mitral valve regurgitation. No evidence of mitral valve stenosis. Tricuspid Valve: The tricuspid valve is normal in structure. Tricuspid valve regurgitation is not demonstrated. No evidence of tricuspid stenosis. Aortic Valve: The aortic valve is tricuspid. There is moderate calcification of the aortic valve. Aortic valve regurgitation is not visualized. Mild to moderate aortic valve sclerosis/calcification is present, without any evidence of aortic stenosis. Pulmonic Valve: The pulmonic valve was normal in structure. Pulmonic valve regurgitation is trivial. No evidence of pulmonic stenosis. Aorta: The aortic root is normal in size and  structure. Venous: The inferior vena cava is normal in size with greater than 50% respiratory variability, suggesting right atrial pressure of 3 mmHg. IAS/Shunts: No atrial level shunt detected by color flow Doppler.  LEFT VENTRICLE PLAX 2D LVIDd:         5.20 cm LVIDs:         3.10 cm LV PW:         0.90 cm LV IVS:        0.90 cm LVOT diam:     2.10 cm LV SV:         73 LV SV Index:   35 LVOT Area:     3.46 cm  RIGHT VENTRICLE TAPSE (M-mode): 2.0 cm LEFT ATRIUM             Index       RIGHT ATRIUM          Index LA diam:        2.90 cm 1.37 cm/m  RA Area:     8.94 cm LA Vol (A2C):   26.2 ml 12.42 ml/m RA Volume:   16.10 ml 7.63 ml/m LA Vol (A4C):   39.7 ml 18.82 ml/m LA Biplane Vol: 34.0 ml 16.12 ml/m  AORTIC VALVE LVOT Vmax:   130.00 cm/s LVOT Vmean:  86.700 cm/s LVOT VTI:  0.212 m  AORTA Ao Root diam: 3.20 cm MITRAL VALVE MV Area (PHT): 6.54 cm     SHUNTS MV Decel Time: 116 msec     Systemic VTI:  0.21 m MV E velocity: 131.00 cm/s  Systemic Diam: 2.10 cm Fransico Him MD Electronically signed by Fransico Him MD Signature Date/Time: 04/27/2020/1:34:33 PM    Final     Cardiac Studies    Echocardiogram 04/27/2020 1. Left ventricular ejection fraction, by estimation, is 60 to 65%. The  left ventricle has normal function. The left ventricle has no regional  wall motion abnormalities. Indeterminate diastolic filling due to E-A  fusion.  2. Right ventricular systolic function is normal. The right ventricular  size is normal. Tricuspid regurgitation signal is inadequate for assessing  PA pressure.  3. The mitral valve is normal in structure. No evidence of mitral valve  regurgitation. No evidence of mitral stenosis.  4. The aortic valve is tricuspid. There is moderate calcification of the  aortic valve. Aortic valve regurgitation is not visualized. Mild to  moderate aortic valve sclerosis/calcification is present, without any  evidence of aortic stenosis.  5. The inferior vena cava is  normal in size with greater than 50%  respiratory variability, suggesting right atrial pressure of 3 mmHg.   Patient Profile     79 y.o. female with a history of hypertension, hyperlipidemia, stage IV chronic kidney disease, stroke (August 2019), diabetes, chronic dyspnea on exertion, colon cancer, palpitations status post catheter ablation (details unclear), and anxiety/depression, who cardiology is following for the evaluation of PVCs in the setting of hypokalemia/hypomagnesemia, anemia, and admission for acute encephalopathy/SIRS, hypotension, acute on chronic kidney disease, cholelithiasis, and n/v/d.    Assessment & Plan    .  PVCs: Patient hospitalized for nausea, vomiting, diarrhea, with subsequent development of encephalopathy/SIRS, hypotension, acute on chronic stage IV kidney disease, anemia, and cholelithiasis.  During admission, she has had metabolic abnormalities with hypotension and hypomagnesemia-3.2 and 1.7 respectively this morning.  She has also been noted to have occasional PVCs and there was concern about possible nonsustained VT.  TSH is normal.  I have reviewed her telemetry monitoring dating back to admission, in detail today.  She does have occasional/frequent PVCs at times, though episodes labeled as nonsustained VT appear to be baseline artifact.  Supplementation for potassium and magnesium have already been ordered this morning.  Recommend trying to maintain potassium at 4.0 and magnesium at 2.0.  No further recommendations at this time.  2.  Acute encephalopathy/SIRS: As outlined above, patient presented with encephalopathy and hypotension in the setting of several days of nausea, vomiting, diarrhea and subsequent development of acute on chronic renal failure.  Blood and urine cultures have shown no growth.  She has had improvement with intravenous hydration.  Management per internal medicine.  3.  Acute on chronic stage IV renal failure: The setting of #2, creatinine was  elevated above baseline at 5.52 on admission.  This is come down to 4.40 this morning with IV hydration.  Home dose of ACE inhibitor and diuretic on hold.  She continues on IV hydration and is being followed by nephrology.  4.  Normocytic anemia: Currently 9.3.  Stable hemoglobin  5.  Hypotension with history of hypertension: Admitted with hypotension in the setting of above.  Home doses of ACE inhibitor and diuretic are on hold.  Blood pressure currently stable.  Continue to follow.  6.  Hyperlipidemia: She is on statin therapy.  Continue to watch LFTs closely.  7.  Cholelithiasis: Noted on abdominal ultrasound without evidence of cholecystitis.  No abdominal pain.  LFTs mildly elevated.  Nausea and vomiting have resolved.    8.  Type 2 diabetes mellitus: Per internal medicine.  9.  Left thyroid nodule: Incidentally noted on CT imaging-2.4 cm left thyroid nodule.  TSH normal.  10.  Ascending aortic dilatation: Ascending aorta measured at 4 cm on CT.  Will need annual follow-up.  11.  Complex right renal mass: Seen on abdominal ultrasound with recommendation for CT abdomen/pelvis with contrast.  Plan for urology follow-up as outpatient.         For questions or updates, please contact [place] Please consult [email or web address] for contact info under        Signed, Cecilie Kicks, NP  04/28/2020, 8:21 AM    Personally seen and examined. Agree with above.   Resting comfortably in bed without any discomfort.  GEN: Well nourished, well developed, in no acute distress  HEENT: normal  Neck: no JVD, carotid bruits, or masses Cardiac: RRR; no murmurs, rubs, or gallops,no edema  Respiratory:  clear to auscultation bilaterally, normal work of breathing GI: soft, nontender, nondistended, + BS MS: no deformity or atrophy  Skin: warm and dry, no rash Neuro:  Alert and Oriented x 3, Strength and sensation are intact Psych: euthymic mood, full affect  Hemoglobin 9.3 creatinine  3.9 down from 4.28  Telemetry personally reviewed shows occasional PVCs.  No evidence of nonsustained VT.  -Continue with current conservative management.  Continue to replete electrolytes, potassium greater than 4 magnesium greater than 2.  Echocardiogram reassuring with normal ejection fraction.  No further work-up for PVCs needed.  We will go ahead and sign off.  Please let us know if we can be of further assistance.  Candee Furbish, MD

## 2020-04-29 DIAGNOSIS — G9340 Encephalopathy, unspecified: Secondary | ICD-10-CM | POA: Diagnosis not present

## 2020-04-29 DIAGNOSIS — I77810 Thoracic aortic ectasia: Secondary | ICD-10-CM | POA: Diagnosis not present

## 2020-04-29 DIAGNOSIS — E041 Nontoxic single thyroid nodule: Secondary | ICD-10-CM

## 2020-04-29 DIAGNOSIS — N2889 Other specified disorders of kidney and ureter: Secondary | ICD-10-CM

## 2020-04-29 DIAGNOSIS — N179 Acute kidney failure, unspecified: Secondary | ICD-10-CM | POA: Diagnosis not present

## 2020-04-29 LAB — RENAL FUNCTION PANEL
Albumin: 1.9 g/dL — ABNORMAL LOW (ref 3.5–5.0)
Anion gap: 15 (ref 5–15)
BUN: 71 mg/dL — ABNORMAL HIGH (ref 8–23)
CO2: 20 mmol/L — ABNORMAL LOW (ref 22–32)
Calcium: 8.8 mg/dL — ABNORMAL LOW (ref 8.9–10.3)
Chloride: 107 mmol/L (ref 98–111)
Creatinine, Ser: 3.3 mg/dL — ABNORMAL HIGH (ref 0.44–1.00)
GFR calc Af Amer: 15 mL/min — ABNORMAL LOW (ref >60)
GFR calc non Af Amer: 13 mL/min — ABNORMAL LOW (ref >60)
Glucose, Bld: 166 mg/dL — ABNORMAL HIGH (ref 70–99)
Phosphorus: 3.5 mg/dL (ref 2.5–4.6)
Potassium: 3.6 mmol/L (ref 3.5–5.1)
Sodium: 142 mmol/L (ref 135–145)

## 2020-04-29 LAB — MAGNESIUM: Magnesium: 1.8 mg/dL (ref 1.7–2.4)

## 2020-04-29 LAB — CBC WITH DIFFERENTIAL/PLATELET
Abs Immature Granulocytes: 0.28 10*3/uL — ABNORMAL HIGH (ref 0.00–0.07)
Basophils Absolute: 0 10*3/uL (ref 0.0–0.1)
Basophils Relative: 0 %
Eosinophils Absolute: 0.3 10*3/uL (ref 0.0–0.5)
Eosinophils Relative: 3 %
HCT: 24.2 % — ABNORMAL LOW (ref 36.0–46.0)
Hemoglobin: 7.9 g/dL — ABNORMAL LOW (ref 12.0–15.0)
Immature Granulocytes: 3 %
Lymphocytes Relative: 14 %
Lymphs Abs: 1.3 10*3/uL (ref 0.7–4.0)
MCH: 31.2 pg (ref 26.0–34.0)
MCHC: 32.6 g/dL (ref 30.0–36.0)
MCV: 95.7 fL (ref 80.0–100.0)
Monocytes Absolute: 1 10*3/uL (ref 0.1–1.0)
Monocytes Relative: 11 %
Neutro Abs: 6.3 10*3/uL (ref 1.7–7.7)
Neutrophils Relative %: 69 %
Platelets: 311 10*3/uL (ref 150–400)
RBC: 2.53 MIL/uL — ABNORMAL LOW (ref 3.87–5.11)
RDW: 13.6 % (ref 11.5–15.5)
WBC: 9.2 10*3/uL (ref 4.0–10.5)
nRBC: 0 % (ref 0.0–0.2)

## 2020-04-29 LAB — GLUCOSE, CAPILLARY
Glucose-Capillary: 144 mg/dL — ABNORMAL HIGH (ref 70–99)
Glucose-Capillary: 224 mg/dL — ABNORMAL HIGH (ref 70–99)
Glucose-Capillary: 145 mg/dL — ABNORMAL HIGH (ref 70–99)
Glucose-Capillary: 150 mg/dL — ABNORMAL HIGH (ref 70–99)

## 2020-04-29 NOTE — Progress Notes (Signed)
Patient ID: [name], female   DOB: [birth year], 79 y.o.   MRN: [id number] [facility] Progress Note   Assessment/ Plan:   1.  Acute kidney injury on chronic kidney disease stage IV (basline creatinine 2.0-2.4): Underlying CKD appears likely from hypertension with hemodynamically mediated AKI from volume depletion in the setting of ongoing ACE-I/loop diuretic.  (corroborated by physical exam and low urine sodium/FeNa, bland UA).  Urine output charted is unimpressive but per RN having unmeasured voids.  Labs shows improving creatinine and acidemia. CT abdomen did not show any hydronephrosis and showed complex left renal cyst.  She is much more awake and alert today and I think she will tolerate being off IVF today --> continue to check RFP qam for now but she seems to be recovering nicely. 2.  Altered mental status: Suspect multifactorial with metabolic encephalopathy associated with azotemia but need to rule out underlying infection that may also be contributing to the same.  No clear evidence of UTI and possible focus noted in the lung.  Given cultures have been negative and she's improved antibiotics have been discontinued. 3.  Anion gap metabolic acidosis: Secondary to acute kidney injury, improving - cont PO NaHCO3. 4.  Anemia:  Possibly associated with chronic kidney disease and notably with low iron saturation of 9% and a ferritin of 674.  S/P IV Fe and PRBCs as IVFs unmasked hemoconcentration and worse anemia.  Hb 9.3 down to 7.9 today.  No overt losses. 5. Hypocalcemia: likely chronic and associated with secondary hyperparathyroidism- worsened with isotonic bicarbonate infusion. Normalized with QHS calcium. 7.  Complex renal cyst:  outpt f/u imaging.   Subjective:   Seen in room, much more awake and alert today.  Denies any bleeding. Says appetite ok and eating and drinking normally.   Has had several unmeasured voids for various reasons - UOP 337mL documented yesterday.     Objective:   BP (!) 141/70   Pulse 93   Temp 97.8 F (36.6 C) (Oral)   Resp 20   Wt 95.7 kg   SpO2 95%   BMI 32.56 kg/m   Intake/Output Summary (Last 24 hours) at 04/29/2020 1048 Last data filed at 04/29/2020 0310 Gross per 24 hour  Intake --  Output 300 ml  Net -300 ml   Weight change: -3.629 kg  Physical Exam: MVH:[id number] resting in bed,awake and alert XLK:GMWNU regular , s1 and s2 with SEM Resp:Decreased breath sounds over bases without rales/rhonchi UVO:ZDGU, obese, non-tender, bowel sounds normal Ext:No lower extremity edema.  Neuro: AOx3  Imaging: ECHOCARDIOGRAM COMPLETE  Result Date: 04/27/2020    ECHOCARDIOGRAM REPORT   Patient Name:   [name] Date of Exam: 04/27/2020 Medical Rec #:  [id number]      Height:       67.5 in Accession #:    [id number]     Weight:       218.0 lb Date of Birth:  [birth year]       BSA:          2.109 m Patient Age:    79 years       BP:           128/58 mmHg Patient Gender: F              HR:           107 bpm. Exam Location:  Inpatient Procedure: 2D Echo, Cardiac Doppler and Color Doppler Indications:    Dyspnea 786.09 / R06.00  History:  Patient has prior history of Echocardiogram examinations, most                 recent 03/24/2018. Stroke; Risk Factors:Hypertension, Diabetes                 and Dyslipidemia. Breast cancer ([place]) (From Hx), CKD (chronic                 kidney disease), stage IV ([place]) (From Hx).  Sonographer:    Alvino Chapel RCS Referring Phys: [place]  1. Left ventricular ejection fraction, by estimation, is 60 to 65%. The left ventricle has normal function. The left ventricle has no regional wall motion abnormalities. Indeterminate diastolic filling due to E-A fusion.  2. Right ventricular systolic function is normal. The right ventricular size is normal. Tricuspid regurgitation signal is inadequate for assessing PA pressure.  3. The mitral valve is normal in structure. No evidence  of mitral valve regurgitation. No evidence of mitral stenosis.  4. The aortic valve is tricuspid. There is moderate calcification of the aortic valve. Aortic valve regurgitation is not visualized. Mild to moderate aortic valve sclerosis/calcification is present, without any evidence of aortic stenosis.  5. The inferior vena cava is normal in size with greater than 50% respiratory variability, suggesting right atrial pressure of 3 mmHg. FINDINGS  Left Ventricle: Left ventricular ejection fraction, by estimation, is 60 to 65%. The left ventricle has normal function. The left ventricle has no regional wall motion abnormalities. The left ventricular internal cavity size was normal in size. There is  no left ventricular hypertrophy. Indeterminate diastolic filling due to E-A fusion. Right Ventricle: The right ventricular size is normal. No increase in right ventricular wall thickness. Right ventricular systolic function is normal. Tricuspid regurgitation signal is inadequate for assessing PA pressure. Left Atrium: Left atrial size was normal in size. Right Atrium: Right atrial size was normal in size. Pericardium: There is no evidence of pericardial effusion. Mitral Valve: The mitral valve is normal in structure. No evidence of mitral valve regurgitation. No evidence of mitral valve stenosis. Tricuspid Valve: The tricuspid valve is normal in structure. Tricuspid valve regurgitation is not demonstrated. No evidence of tricuspid stenosis. Aortic Valve: The aortic valve is tricuspid. There is moderate calcification of the aortic valve. Aortic valve regurgitation is not visualized. Mild to moderate aortic valve sclerosis/calcification is present, without any evidence of aortic stenosis. Pulmonic Valve: The pulmonic valve was normal in structure. Pulmonic valve regurgitation is trivial. No evidence of pulmonic stenosis. Aorta: The aortic root is normal in size and structure. Venous: The inferior vena cava is normal in size  with greater than 50% respiratory variability, suggesting right atrial pressure of 3 mmHg. IAS/Shunts: No atrial level shunt detected by color flow Doppler.  LEFT VENTRICLE PLAX 2D LVIDd:         5.20 cm LVIDs:         3.10 cm LV PW:         0.90 cm LV IVS:        0.90 cm LVOT diam:     2.10 cm LV SV:         73 LV SV Index:   35 LVOT Area:     3.46 cm  RIGHT VENTRICLE TAPSE (M-mode): 2.0 cm LEFT ATRIUM             Index       RIGHT ATRIUM          Index LA  diam:        2.90 cm 1.37 cm/m  RA Area:     8.94 cm LA Vol (A2C):   26.2 ml 12.42 ml/m RA Volume:   16.10 ml 7.63 ml/m LA Vol (A4C):   39.7 ml 18.82 ml/m LA Biplane Vol: 34.0 ml 16.12 ml/m  AORTIC VALVE LVOT Vmax:   130.00 cm/s LVOT Vmean:  86.700 cm/s LVOT VTI:    0.212 m  AORTA Ao Root diam: 3.20 cm MITRAL VALVE MV Area (PHT): 6.54 cm     SHUNTS MV Decel Time: 116 msec     Systemic VTI:  0.21 m MV E velocity: 131.00 cm/s  Systemic Diam: 2.10 cm Fransico Him MD Electronically signed by Fransico Him MD Signature Date/Time: 04/27/2020/1:34:33 PM    Final     Labs: BMET Recent Labs  Lab 04/26/20 0500 04/26/20 0500 04/26/20 1107 04/26/20 1635 04/27/20 0230 04/27/20 1026 04/28/20 0316 04/28/20 0808 04/29/20 0324  NA 137   < > 136 136 147* 137 142 139 142  K 3.7   < > 3.4* 3.8 3.2* 3.0* 3.3* 3.7 3.6  CL 105  --   --  109 99 103 108 105 107  CO2 11*  --   --  13* 13* 16* 19* 18* 20*  GLUCOSE 83  --   --  141* 109* 195* 167* 176* 166*  BUN 93*  --   --  92* 90* 92* 82* 81* 71*  CREATININE 5.29*  --   --  4.89* 4.40* 4.28* 3.80* 3.91* 3.30*  CALCIUM 8.6*  --   --  8.3* 6.8* 8.0* 8.5* 9.0 8.8*  PHOS  --   --   --   --  4.4 4.2 3.6 3.5 3.5   < > = values in this interval not displayed.   CBC Recent Labs  Lab 04/27/20 1026 04/28/20 0316 04/28/20 0808 04/29/20 0324  WBC 12.9* 11.1* 10.9* 9.2  NEUTROABS 11.2* 9.0* 8.9* 6.3  HGB 8.2* 8.5* 9.3* 7.9*  HCT 24.2* 25.1* 28.6* 24.2*  MCV 92.4 94.4 95.7 95.7  PLT 282 305 330 311     Medications:    . sodium chloride   Intravenous Once  . acetaminophen  650 mg Oral Once  . ARIPiprazole  5 mg Oral Daily  . atorvastatin  80 mg Oral q1800  . calcium carbonate  1,000 mg of elemental calcium Oral QHS  . clopidogrel  75 mg Oral Daily  . diphenhydrAMINE  25 mg Oral Once  . furosemide  20 mg Intravenous Once  . heparin  5,000 Units Subcutaneous Q8H  . insulin aspart  0-9 Units Subcutaneous TID WC  . mirtazapine  15 mg Oral QHS  . sodium bicarbonate  1,300 mg Oral TID   Jannifer Hick MD [facility] Pager [phone]

## 2020-04-29 NOTE — Progress Notes (Signed)
Patient ID: [name], female   DOB: 01/29/1941, 79 y.o.   MRN: [id number]  PROGRESS NOTE    [name]  XQJ:[id number] DOB: [birth year] DOA: 04/25/2020 PCP: Crist Infante, MD   Brief Narrative:  79 y.o.femalewith medical history significant fortype 2 diabetes, HTN, HLD, CKD stage IV, history of unspecified CVA, and depression/anxiety was admitted with acute encephalopathy, SIRS, acute kidney injury on chronic kidney disease stage IV secondary to combined volume depletion and possible ATN, cholelithiasis with pericholecystic fluid.  She was also started on broad-spectrum antibiotics.  On presentation, creatinine was 5.3, baseline creatinine of 1.97.  CT of the head was negative for acute abnormality.  CT chest revealed 4 cm ascending aortic aneurysm and 2.4 cm left thyroid nodule.  Right upper quadrant ultrasound showed cholelithiasis with pericholecystic fluid but no evidence of cholecystitis.  Nephrology was consulted.  Currently, cardiology was consulted for PVCs/?  Nonsustained VT's.  PT recommended CIR placement.  Assessment & Plan:   Acute metabolic encephalopathy ?  SIRS -Patient presented with acute encephalopathy with concern for some kind of infection.  She was started on broad-spectrum antibiotics.  COVID-19 testing on presentation was negative.  Cultures have been negative so far. -Subsequently, mental status is much improved.  Still slow to respond. -No evidence of infection.  Antibiotics discontinued on 04/28/2020 after discussion with patient's son -PT is recommending CIR placement.  CIR consulted.  AKI on CKD stage IV Acute metabolic acidosis -Prior creatinine was 1.97 2 years ago.  Creatinine 5.52 on presentation. -Likely from combination of hypovolemia and ATN -Nephrology following.  IV fluids as per nephrology recommendations.  Acidosis improving.  Continue sodium bicarbonate tablets. -Creatinine 3.30 today. -Lasix and lisinopril  held.   Cholelithiasis -Cholelithiasis with mild amount of pericholecystic fluid on right upper quadrant ultrasound without evidence of acute cholecystitis. -Currently does not have any abdominal pain.  Only mildly elevated LFTs on presentation.  Will repeat LFTs in a.m. -No need for HIDA scan at this time.  Frequent PVCs/nonsustained V. Tach -Cardiology has signed off.  Recommend potassium and magnesium to be above 4 and 2 respectively.  No further work-up recommended.  Echo showed EF of 60 to 65%.  Diabetes mellitus type 2 -Uncontrolled with hyperglycemia.  Continue CBGs with SSI  Hypertension -Monitor blood pressure.  Lisinopril and Lasix on hold  History of unspecified CVA -Continue Plavix and statin  Hyperlipidemia -Continue statin  Anemia of chronic disease -Hemoglobin stable.  Transfuse if hemoglobin is less than 7.  Left thyroid nodule -2.4 cm thyroid nodule seen on CT imaging.  Consider follow-up thyroid ultrasound as an outpatient.  Ascending aortic aneurysm -4 cm ascending aortic aneurysm seen on CT.  Annual follow-up by CTA or MRA recommended.  Outpatient follow-up.  Recommend outpatient CT surgery evaluation  Complex right renal mass -Seen on abdominal ultrasound. -May be renal cyst seen on prior renal ultrasound however correlation with contrast-enhanced CT abdomen/pelviswasrecommended. Cannot obtain contrast imagingat this time due to AKI. Will need referral to urology when patient is stabilized.  Depression/anxiety -Continue Abilify and Remeron along with as needed Ativan.  Generalized deconditioning/weakness -PT/OT recommend CIR placement.  CIR consulted.   DVT prophylaxis: Subcutaneous heparin Code Status: Full Family Communication: Called son/Dr. Phylliss Bob on phone on 04/29/2020 Disposition Plan: Status is: Inpatient  Remains inpatient appropriate because:Inpatient level of care appropriate due to severity of illness   Dispo: The patient  is from: Home              Anticipated d/c  is to: CIR vs home with home health              Anticipated d/c date is: 1 day              Patient currently is not medically stable to d/c.   Consultants: Nephrology/cardiology  Procedures: Echo as above  Antimicrobials:  Anti-infectives (From admission, onward)   Start     Dose/Rate Route Frequency Ordered Stop   04/26/20 2000  piperacillin-tazobactam (ZOSYN) IVPB 2.25 g  Status:  Discontinued        2.25 g 100 mL/hr over 30 Minutes Intravenous Every 8 hours 04/26/20 1116 04/28/20 2032   04/26/20 1030  piperacillin-tazobactam (ZOSYN) IVPB 3.375 g        3.375 g 100 mL/hr over 30 Minutes Intravenous  Once 04/26/20 1016 04/26/20 1239       Subjective: Patient seen and examined at bedside. Sitting up in chair. Answer some more questions. Still slow to respond to questions. Denies worsening shortness of breath, nausea or vomiting.  Objective: Vitals:   04/29/20 0450 04/29/20 0721 04/29/20 0743 04/29/20 1117  BP: 130/68 (!) 141/70  (!) 143/83  Pulse: 94 98 93 98  Resp: 17 20  20   Temp: 97.8 F (36.6 C)  97.8 F (36.6 C) 98.7 F (37.1 C)  TempSrc: Oral  Oral Oral  SpO2: 95% 96% 95% 95%  Weight: 95.7 kg       Intake/Output Summary (Last 24 hours) at 04/29/2020 1358 Last data filed at 04/29/2020 0830 Gross per 24 hour  Intake --  Output 700 ml  Net -700 ml   Filed Weights   04/27/20 0356 04/28/20 0501 04/29/20 0450  Weight: 98.9 kg 99.3 kg 95.7 kg    Examination:  General exam: Appears calm and comfortable. Elderly female sitting on chair. Respiratory system: Bilateral decreased breath sounds at bases with some scattered crackles Cardiovascular system: S1 & S2 heard, Rate controlled Gastrointestinal system: Abdomen is nondistended, soft and nontender. Normal bowel sounds heard. Extremities: No cyanosis, clubbing; trace lower extremity edema Central nervous system: Awake, slightly confused to time. No focal neurological  deficits. Moving extremities Skin: No rashes, lesions or ulcers Psychiatry: Could not be assessed because of mental status    Data Reviewed: I have personally reviewed following labs and imaging studies  CBC: Recent Labs  Lab 04/27/20 0230 04/27/20 1026 04/28/20 0316 04/28/20 0808 04/29/20 0324  WBC 13.7* 12.9* 11.1* 10.9* 9.2  NEUTROABS 11.7* 11.2* 9.0* 8.9* 6.3  HGB 6.1* 8.2* 8.5* 9.3* 7.9*  HCT 18.7* 24.2* 25.1* 28.6* 24.2*  MCV 96.4 92.4 94.4 95.7 95.7  PLT 267 282 305 330 852   Basic Metabolic Panel: Recent Labs  Lab 04/27/20 0230 04/27/20 1026 04/28/20 0316 04/28/20 0808 04/29/20 0324  NA 147* 137 142 139 142  K 3.2* 3.0* 3.3* 3.7 3.6  CL 99 103 108 105 107  CO2 13* 16* 19* 18* 20*  GLUCOSE 109* 195* 167* 176* 166*  BUN 90* 92* 82* 81* 71*  CREATININE 4.40* 4.28* 3.80* 3.91* 3.30*  CALCIUM 6.8* 8.0* 8.5* 9.0 8.8*  MG 1.7 2.0 1.8 2.1 1.8  PHOS 4.4 4.2 3.6 3.5 3.5   GFR: CrCl cannot be calculated (Unknown ideal weight.). Liver Function Tests: Recent Labs  Lab 04/25/20 1639 04/25/20 1639 04/26/20 0500 04/26/20 0500 04/27/20 0230 04/27/20 1026 04/28/20 0316 04/28/20 0808 04/29/20 0324  AST 65*  --  76*  --   --   --   --   --   --  ALT 49*  --  53*  --   --   --   --   --   --   ALKPHOS 123  --  132*  --   --   --   --   --   --   BILITOT 1.3*  --  1.1  --   --   --   --   --   --   PROT 6.7  --  6.4*  --   --   --   --   --   --   ALBUMIN 2.7*   < > 2.5*   < > 2.0* 2.0* 1.9* 2.1* 1.9*   < > = values in this interval not displayed.   Recent Labs  Lab 04/26/20 0500  LIPASE 53*   No results for input(s): AMMONIA in the last 168 hours. Coagulation Profile: No results for input(s): INR, PROTIME in the last 168 hours. Cardiac Enzymes: No results for input(s): CKTOTAL, CKMB, CKMBINDEX, TROPONINI in the last 168 hours. BNP (last 3 results) No results for input(s): PROBNP in the last 8760 hours. HbA1C: No results for input(s): HGBA1C in the last  72 hours. CBG: Recent Labs  Lab 04/28/20 1130 04/28/20 1705 04/28/20 2113 04/29/20 0720 04/29/20 1115  GLUCAP 333* 162* 172* 144* 224*   Lipid Profile: No results for input(s): CHOL, HDL, LDLCALC, TRIG, CHOLHDL, LDLDIRECT in the last 72 hours. Thyroid Function Tests: No results for input(s): TSH, T4TOTAL, FREET4, T3FREE, THYROIDAB in the last 72 hours. Anemia Panel: No results for input(s): VITAMINB12, FOLATE, FERRITIN, TIBC, IRON, RETICCTPCT in the last 72 hours. Sepsis Labs: Recent Labs  Lab 04/25/20 1800  LATICACIDVEN 1.2    Recent Results (from the past 240 hour(s))  Culture, blood (routine x 2)     Status: None (Preliminary result)   Collection Time: 04/25/20  5:07 PM   Specimen: BLOOD  Result Value Ref Range Status   Specimen Description BLOOD LEFT ANTECUBITAL  Final   Special Requests   Final    BOTTLES DRAWN AEROBIC AND ANAEROBIC Blood Culture adequate volume   Culture   Final    NO GROWTH 4 DAYS Performed at [facility], [address], [place], [place] [address]    Report Status PENDING  Incomplete  Respiratory Panel by RT PCR (Flu A&B, Covid) - Nasopharyngeal Swab     Status: None   Collection Time: 04/25/20  7:28 PM   Specimen: Nasopharyngeal Swab  Result Value Ref Range Status   SARS Coronavirus 2 by RT PCR NEGATIVE NEGATIVE Final    Comment: (NOTE) SARS-CoV-2 target nucleic acids are NOT DETECTED.  The SARS-CoV-2 RNA is generally detectable in upper respiratoy specimens during the acute phase of infection. The lowest concentration of SARS-CoV-2 viral copies this assay can detect is 131 copies/mL. A negative result does not preclude SARS-Cov-2 infection and should not be used as the sole basis for treatment or other patient management decisions. A negative result may occur with  improper specimen collection/handling, submission of specimen other than nasopharyngeal swab, presence of viral mutation(s) within the areas targeted by this assay, and  inadequate number of viral copies (<131 copies/mL). A negative result must be combined with clinical observations, patient history, and epidemiological information. The expected result is Negative.  Fact Sheet for Patients:  PinkCheek.be  Fact Sheet for Healthcare Providers:  GravelBags.it  This test is no t yet approved or cleared by the [place] and  has been authorized  for detection and/or diagnosis of SARS-CoV-2 by FDA under an Emergency Use Authorization (EUA). This EUA will remain  in effect (meaning this test can be used) for the duration of the COVID-19 declaration under Section 564(b)(1) of the Act, 21 U.S.C. section 360bbb-3(b)(1), unless the authorization is terminated or revoked sooner.     Influenza A by PCR NEGATIVE NEGATIVE Final   Influenza B by PCR NEGATIVE NEGATIVE Final    Comment: (NOTE) The Xpert Xpress SARS-CoV-2/FLU/RSV assay is intended as an aid in  the diagnosis of influenza from Nasopharyngeal swab specimens and  should not be used as a sole basis for treatment. Nasal washings and  aspirates are unacceptable for Xpert Xpress SARS-CoV-2/FLU/RSV  testing.  Fact Sheet for Patients: PinkCheek.be  Fact Sheet for Healthcare Providers: GravelBags.it  This test is not yet approved or cleared by the [place] FDA and  has been authorized for detection and/or diagnosis of SARS-CoV-2 by  FDA under an Emergency Use Authorization (EUA). This EUA will remain  in effect (meaning this test can be used) for the duration of the  Covid-19 declaration under Section 564(b)(1) of the Act, 21  U.S.C. section 360bbb-3(b)(1), unless the authorization is  terminated or revoked. Performed at [facility], [place] [address], [place], [place] [address]   Culture, blood (routine x 2)     Status: None (Preliminary result)   Collection Time:  04/25/20  7:29 PM   Specimen: BLOOD  Result Value Ref Range Status   Specimen Description BLOOD SITE NOT SPECIFIED  Final   Special Requests   Final    BOTTLES DRAWN AEROBIC AND ANAEROBIC Blood Culture results may not be optimal due to an inadequate volume of blood received in culture bottles   Culture   Final    NO GROWTH 4 DAYS Performed at [facility], [place] [address], [place], [place] [address]    Report Status PENDING  Incomplete  Urine culture     Status: None   Collection Time: 04/25/20 10:00 PM   Specimen: Urine, Random  Result Value Ref Range Status   Specimen Description URINE, RANDOM  Final   Special Requests NONE  Final   Culture   Final    NO GROWTH Performed at [facility], [address], [place], [place] [address]    Report Status 04/27/2020 FINAL  Final         Radiology Studies: No results found.      Scheduled Meds: . sodium chloride   Intravenous Once  . acetaminophen  650 mg Oral Once  . ARIPiprazole  5 mg Oral Daily  . atorvastatin  80 mg Oral q1800  . calcium carbonate  1,000 mg of elemental calcium Oral QHS  . clopidogrel  75 mg Oral Daily  . heparin  5,000 Units Subcutaneous Q8H  . insulin aspart  0-9 Units Subcutaneous TID WC  . mirtazapine  15 mg Oral QHS  . sodium bicarbonate  1,300 mg Oral TID   Continuous Infusions: . ferumoxytol Stopped (04/26/20 1355)  . lactated ringers 75 mL/hr at 04/29/20 0448  . lactated ringers            Aline August, MD [facility] 04/29/2020, 1:58 PM

## 2020-04-29 NOTE — Progress Notes (Signed)
Inpatient Rehab Admissions:  Inpatient Rehab Consult received.  I met with patient at the bedside for rehabilitation assessment and to discuss goals and expectations of an inpatient rehab admission.  Her confusion seems to be clearing, per documentation, though there is definitely still some difficulty with tracking conversation.  We discussed likely LOS, goals of supervision assist, and possibility of transition to ALF.  Pt is aware that family has suggested ALF, and we discussed that (unless this is already in the works) she would likely need to d/c home with 24/7 support in the interim.  She is already mobilizing at min guard level (though minimal distance), and, with her AKI and encephalopathy resolving, may lack the medical necessity for a CIR admit.  I attempted to call her son, Dr. Lynann Bologna, to discuss but no answer and voicemail box full.  I will try again in the morning, or he can call my number below. I also left a card in her room.  Will continue to follow to determine most appropriate post-acute rehab venue.   Signed: Shann Medal, PT, DPT Admissions Coordinator [phone] 04/29/20  4:38 PM

## 2020-04-30 DIAGNOSIS — F418 Other specified anxiety disorders: Secondary | ICD-10-CM

## 2020-04-30 DIAGNOSIS — Z8673 Personal history of transient ischemic attack (TIA), and cerebral infarction without residual deficits: Secondary | ICD-10-CM

## 2020-04-30 DIAGNOSIS — I77810 Thoracic aortic ectasia: Secondary | ICD-10-CM | POA: Diagnosis not present

## 2020-04-30 DIAGNOSIS — G9340 Encephalopathy, unspecified: Secondary | ICD-10-CM | POA: Diagnosis not present

## 2020-04-30 DIAGNOSIS — N179 Acute kidney failure, unspecified: Secondary | ICD-10-CM | POA: Diagnosis not present

## 2020-04-30 LAB — CBC WITH DIFFERENTIAL/PLATELET
Abs Immature Granulocytes: 0.4 10*3/uL — ABNORMAL HIGH (ref 0.00–0.07)
Basophils Absolute: 0 10*3/uL (ref 0.0–0.1)
Basophils Relative: 1 %
Eosinophils Absolute: 0.2 10*3/uL (ref 0.0–0.5)
Eosinophils Relative: 3 %
HCT: 24.8 % — ABNORMAL LOW (ref 36.0–46.0)
Hemoglobin: 8.2 g/dL — ABNORMAL LOW (ref 12.0–15.0)
Immature Granulocytes: 5 %
Lymphocytes Relative: 15 %
Lymphs Abs: 1.3 10*3/uL (ref 0.7–4.0)
MCH: 32.7 pg (ref 26.0–34.0)
MCHC: 33.1 g/dL (ref 30.0–36.0)
MCV: 98.8 fL (ref 80.0–100.0)
Monocytes Absolute: 1 10*3/uL (ref 0.1–1.0)
Monocytes Relative: 12 %
Neutro Abs: 5.8 10*3/uL (ref 1.7–7.7)
Neutrophils Relative %: 64 %
Platelets: 346 10*3/uL (ref 150–400)
RBC: 2.51 MIL/uL — ABNORMAL LOW (ref 3.87–5.11)
RDW: 14 % (ref 11.5–15.5)
WBC: 8.7 10*3/uL (ref 4.0–10.5)
nRBC: 0 % (ref 0.0–0.2)

## 2020-04-30 LAB — BASIC METABOLIC PANEL
Anion gap: 14 (ref 5–15)
BUN: 62 mg/dL — ABNORMAL HIGH (ref 8–23)
CO2: 20 mmol/L — ABNORMAL LOW (ref 22–32)
Calcium: 8.7 mg/dL — ABNORMAL LOW (ref 8.9–10.3)
Chloride: 109 mmol/L (ref 98–111)
Creatinine, Ser: 2.83 mg/dL — ABNORMAL HIGH (ref 0.44–1.00)
GFR calc non Af Amer: 15 mL/min — ABNORMAL LOW (ref >60)
Glucose, Bld: 149 mg/dL — ABNORMAL HIGH (ref 70–99)
Potassium: 4.4 mmol/L (ref 3.5–5.1)
Sodium: 143 mmol/L (ref 135–145)

## 2020-04-30 LAB — GLUCOSE, CAPILLARY
Glucose-Capillary: 163 mg/dL — ABNORMAL HIGH (ref 70–99)
Glucose-Capillary: 184 mg/dL — ABNORMAL HIGH (ref 70–99)
Glucose-Capillary: 125 mg/dL — ABNORMAL HIGH (ref 70–99)
Glucose-Capillary: 128 mg/dL — ABNORMAL HIGH (ref 70–99)

## 2020-04-30 LAB — CULTURE, BLOOD (ROUTINE X 2)
Culture: NO GROWTH
Culture: NO GROWTH
Special Requests: ADEQUATE

## 2020-04-30 LAB — MAGNESIUM: Magnesium: 1.7 mg/dL (ref 1.7–2.4)

## 2020-04-30 MED ORDER — INFLUENZA VAC A&B SA ADJ QUAD 0.5 ML IM PRSY
0.5000 mL | PREFILLED_SYRINGE | INTRAMUSCULAR | Status: AC
  Administered 2020-05-01: 0.5 mL via INTRAMUSCULAR
  Filled 2020-04-30: qty 0.5

## 2020-04-30 MED ORDER — MAGNESIUM SULFATE 2 GM/50ML IV SOLN
2.0000 g | Freq: Once | INTRAVENOUS | Status: AC
  Administered 2020-04-30: 2 g via INTRAVENOUS
  Filled 2020-04-30: qty 50

## 2020-04-30 MED ORDER — VENLAFAXINE HCL ER 150 MG PO CP24
150.0000 mg | ORAL_CAPSULE | Freq: Every day | ORAL | Status: DC
  Administered 2020-04-30 – 2020-05-05 (×6): 150 mg via ORAL
  Filled 2020-04-30 (×7): qty 1

## 2020-04-30 NOTE — Progress Notes (Signed)
Physical Therapy Treatment Patient Details Name: [name] MRN: [id number] DOB: 1940-08-13 Today's Date: 04/30/2020    History of Present Illness 79 yo female with onset of AKI and AMS was brought to ED, noted atelectasis, cholelithiasis, acute encephalopathy, SIRS, and concerns about renal mass.  PMHx:  R eye macular degeneration, CVA 2019, colon CA, radiation therapy, thyroid nodule, atherosclerosis, HTN, CKD4, stroke    PT Comments    Pt fully participated in session; pt continues to be confused but motivated to participate; pt demonstrating varying amounts of assist needed for sit<>stand transfers; pt fatigued during ambulation with an increase in HR requiring seated rest break; pt continues to demonstrate balance deficits requiring increased WBing through B UEs on RW with all static and dynamic standing activities; pt will benefit from skilled PT to address deficits in balance, strength, coordination, endurance, gait and safety to maximize independence with functional mobility and return to PLOF     Follow Up Recommendations  CIR     Equipment Recommendations  None recommended by PT    Recommendations for Other Services       Precautions / Restrictions Precautions Precautions: Fall Precaution Comments: monitor O2 and pulses Restrictions Weight Bearing Restrictions: No    Mobility  Bed Mobility Overal bed mobility: Needs Assistance Bed Mobility: Supine to Sit;Sit to Supine     Supine to sit: Min assist;HOB elevated Sit to supine: Min assist;HOB elevated   General bed mobility comments: used bed rail to support OOB  Transfers Overall transfer level: Needs assistance Equipment used: Rolling walker (2 wheeled) Transfers: Sit to/from Stand Sit to Stand: Min assist;Mod assist Stand pivot transfers: Min assist       General transfer comment: pt performed sit<>stand x 3 trials; 2 trials min A and 1 trial mod A  Ambulation/Gait Ambulation/Gait assistance: Min  guard Gait Distance (Feet): 25 Feet Assistive device: Rolling walker (2 wheeled) Gait Pattern/deviations: Step-through pattern;Decreased stride length Gait velocity: reduced       Stairs             Wheelchair Mobility    Modified Rankin (Stroke Patients Only)       Balance Overall balance assessment: Needs assistance Sitting-balance support: Feet supported Sitting balance-Leahy Scale: Good     Standing balance support: Bilateral upper extremity supported Standing balance-Leahy Scale: Fair Standing balance comment: increased weight bearing through B UEs on RW during all standing activities                            Cognition                                       General Comments: pt aware she is confused but continues to demonstrate poor safety awareness and awareness of deficits      Exercises      General Comments        Pertinent Vitals/Pain      Home Living                      Prior Function            PT Goals (current goals can now be found in the care plan section) Acute Rehab PT Goals Patient Stated Goal: to get stronger and go back home PT Goal Formulation: With patient Time For Goal Achievement: 05/10/20 Potential to Achieve  Goals: Good Progress towards PT goals: Progressing toward goals    Frequency    Min 4X/week      PT Plan Current plan remains appropriate    Co-evaluation              AM-PAC PT "6 Clicks" Mobility   Outcome Measure    Help needed moving from lying on your back to sitting on the side of a flat bed without using bedrails?: A Little Help needed moving to and from a bed to a chair (including a wheelchair)?: A Little Help needed standing up from a chair using your arms (e.g., wheelchair or bedside chair)?: A Lot Help needed to walk in hospital room?: A Little Help needed climbing 3-5 steps with a railing? : A Lot 6 Click Score: 13    End of Session Equipment  Utilized During Treatment: Gait belt Activity Tolerance: Patient limited by fatigue;Treatment limited secondary to medical complications (Comment) Patient left: in chair;with call bell/phone within reach;with chair alarm set Nurse Communication: Mobility status PT Visit Diagnosis: Unsteadiness on feet (R26.81);Muscle weakness (generalized) (M62.81);Difficulty in walking, not elsewhere classified (R26.2)     Time: 2500-3704 PT Time Calculation (min) (ACUTE ONLY): 16 min  Charges:  $Gait Training: 8-22 mins                     Lyanne Co, DPT Acute Rehabilitation Services [phone]   [place] 04/30/2020, 12:44 PM

## 2020-04-30 NOTE — Progress Notes (Signed)
Patient ID: [name], female   DOB: 1940-12-12, 79 y.o.   MRN: [id number]  PROGRESS NOTE    [name]  RSW:[id number] DOB: 03/07/1978 DOA: 04/25/2020 PCP: Makayla Infante, MD   Brief Narrative:  79 y.o.femalewith medical history significant fortype 2 diabetes, HTN, HLD, CKD stage IV, history of unspecified CVA, and depression/anxiety was admitted with acute encephalopathy, SIRS, acute kidney injury on chronic kidney disease stage IV secondary to combined volume depletion and possible ATN, cholelithiasis with pericholecystic fluid.  She was also started on broad-spectrum antibiotics.  On presentation, creatinine was 5.3, baseline creatinine of 1.97.  CT of the head was negative for acute abnormality.  CT chest revealed 4 cm ascending aortic aneurysm and 2.4 cm left thyroid nodule.  Right upper quadrant ultrasound showed cholelithiasis with pericholecystic fluid but no evidence of cholecystitis.  Nephrology was consulted.  Currently, cardiology was consulted for PVCs/?  Nonsustained VT's.  PT recommended CIR placement.  Assessment & Plan:   Acute metabolic encephalopathy ?  SIRS -Patient presented with acute encephalopathy with concern for some kind of infection.  She was started on broad-spectrum antibiotics.  COVID-19 testing on presentation was negative.  Cultures have been negative so far. -Subsequently, mental status is much improved.  Still slow to respond. -No evidence of infection.  Antibiotics discontinued on 04/28/2020 after discussion with patient's son -PT is recommending CIR placement.  CIR consulted.  AKI on CKD stage IV Acute metabolic acidosis -Prior creatinine was 1.97 2 years ago.  Creatinine 5.52 on presentation. -Likely from combination of hypovolemia and ATN -Nephrology following.  IV fluids discontinued on 04/29/2020 by nephrology.  Acidosis improving.  Continue sodium bicarbonate tablets. -Creatinine 2.83 today. -Lasix and lisinopril  held.   Cholelithiasis -Cholelithiasis with mild amount of pericholecystic fluid on right upper quadrant ultrasound without evidence of acute cholecystitis. -Currently does not have any abdominal pain.  Only mildly elevated LFTs on presentation.  Will repeat LFTs in a.m. -No need for HIDA scan at this time.  Frequent PVCs/nonsustained V. Tach -Cardiology has signed off.  Recommend potassium and magnesium to be above 4 and 2 respectively.  No further work-up recommended.  Echo showed EF of 60 to 65%.  Diabetes mellitus type 2 -Uncontrolled with hyperglycemia.  Continue CBGs with SSI  Hypertension -Monitor blood pressure.  Lisinopril and Lasix on hold  History of unspecified CVA -Continue Plavix and statin  Hyperlipidemia -Continue statin  Anemia of chronic disease -Hemoglobin stable.  Transfuse if hemoglobin is less than 7.  Left thyroid nodule -2.4 cm thyroid nodule seen on CT imaging.  Consider follow-up thyroid ultrasound as an outpatient.  Ascending aortic aneurysm -4 cm ascending aortic aneurysm seen on CT.  Annual follow-up by CTA or MRA recommended.  Outpatient follow-up.  Recommend outpatient CT surgery evaluation  Complex right renal mass -Seen on abdominal ultrasound. -May be renal cyst seen on prior renal ultrasound however correlation with contrast-enhanced CT abdomen/pelviswasrecommended. Cannot obtain contrast imagingat this time due to AKI. Will need referral to urology when patient is stabilized.  Depression/anxiety -Continue Abilify and Remeron along with as needed Ativan.  Generalized deconditioning/weakness -PT/OT recommend CIR placement.  CIR consulted.  Patient is unsure about CIR placement at this time.  Will have patient work with PT again today to see how she is doing.  DVT prophylaxis: Subcutaneous heparin Code Status: Full Family Communication: Spoke to son/Dr. Phylliss Knapp on 04/29/2020. Disposition Plan: Status is: Inpatient  Remains  inpatient appropriate because:Inpatient level of care appropriate due to severity of  illness   Dispo: The patient is from: Home              Anticipated d/c is to: CIR vs home with home health              Anticipated d/c date is: 79 day              Patient currently is not medically stable to d/c.   Consultants: Nephrology/cardiology  Procedures: Echo as above  Antimicrobials:  Anti-infectives (From admission, onward)   Start     Dose/Rate Route Frequency Ordered Stop   04/26/20 2000  piperacillin-tazobactam (ZOSYN) IVPB 2.25 g  Status:  Discontinued        2.25 g 100 mL/hr over 30 Minutes Intravenous Every 8 hours 04/26/20 1116 04/28/20 2032   04/26/20 1030  piperacillin-tazobactam (ZOSYN) IVPB 3.375 g        3.375 g 100 mL/hr over 30 Minutes Intravenous  Once 04/26/20 1016 04/26/20 1239       Subjective: Patient seen and examined at bedside. Sitting up in chair.  Still slow to respond to questions but feels better.  Still feels weak.  No overnight fever, vomiting, worsening shortness of breath reported.  Objective: Vitals:   04/29/20 1641 04/29/20 1958 04/30/20 0106 04/30/20 0441  BP: (!) 145/83 (!) 154/83 126/69 (!) 150/79  Pulse: 99   97  Resp: 17 18 16 16   Temp: 98.9 F (37.2 C) 99 F (37.2 C) 98 F (36.7 C) 99.5 F (37.5 C)  TempSrc: Oral Oral  Oral  SpO2: 97% 96% 94% 95%  Weight:    88.7 kg    Intake/Output Summary (Last 24 hours) at 04/30/2020 0927 Last data filed at 04/30/2020 0526 Gross per 24 hour  Intake 720 ml  Output 600 ml  Net 120 ml   Filed Weights   04/28/20 0501 04/29/20 0450 04/30/20 0441  Weight: 99.3 kg 95.7 kg 88.7 kg    Examination:  General exam: No acute distress.  Elderly female sitting on chair. Respiratory system: Bilateral decreased breath sounds at bases with no wheezing.  Some scattered crackles  cardiovascular system: Rate controlled, S1-S2 heard Gastrointestinal system: Abdomen is nondistended, soft and nontender.  Bowel  sounds are heard  extremities: Mild lower extremity edema present.  No clubbing  Central nervous system: Poor historian. Still slow to respond. No focal neurological deficits. Moving extremities Skin: No obvious ecchymosis/lesions  psychiatry: Cannot be assessed because of patient being a poor historian   Data Reviewed: I have personally reviewed following labs and imaging studies  CBC: Recent Labs  Lab 04/27/20 1026 04/28/20 0316 04/28/20 0808 04/29/20 0324 04/30/20 0338  WBC 12.9* 11.1* 10.9* 9.2 8.7  NEUTROABS 11.2* 9.0* 8.9* 6.3 5.8  HGB 8.2* 8.5* 9.3* 7.9* 8.2*  HCT 24.2* 25.1* 28.6* 24.2* 24.8*  MCV 92.4 94.4 95.7 95.7 98.8  PLT 282 305 330 311 182   Basic Metabolic Panel: Recent Labs  Lab 04/27/20 0230 04/27/20 0230 04/27/20 1026 04/28/20 0316 04/28/20 0808 04/29/20 0324 04/30/20 0338  NA 147*   < > 137 142 139 142 143  K 3.2*   < > 3.0* 3.3* 3.7 3.6 4.4  CL 99   < > 103 108 105 107 109  CO2 13*   < > 16* 19* 18* 20* 20*  GLUCOSE 109*   < > 195* 167* 176* 166* 149*  BUN 90*   < > 92* 82* 81* 71* 62*  CREATININE 4.40*   < > 4.28*  3.80* 3.91* 3.30* 2.83*  CALCIUM 6.8*   < > 8.0* 8.5* 9.0 8.8* 8.7*  MG 1.7   < > 2.0 1.8 2.1 1.8 1.7  PHOS 4.4  --  4.2 3.6 3.5 3.5  --    < > = values in this interval not displayed.   GFR: CrCl cannot be calculated (Unknown ideal weight.). Liver Function Tests: Recent Labs  Lab 04/25/20 1639 04/25/20 1639 04/26/20 0500 04/26/20 0500 04/27/20 0230 04/27/20 1026 04/28/20 0316 04/28/20 0808 04/29/20 0324  AST 65*  --  76*  --   --   --   --   --   --   ALT 49*  --  53*  --   --   --   --   --   --   ALKPHOS 123  --  132*  --   --   --   --   --   --   BILITOT 1.3*  --  1.1  --   --   --   --   --   --   PROT 6.7  --  6.4*  --   --   --   --   --   --   ALBUMIN 2.7*   < > 2.5*   < > 2.0* 2.0* 1.9* 2.1* 1.9*   < > = values in this interval not displayed.   Recent Labs  Lab 04/26/20 0500  LIPASE 53*   No results for  input(s): AMMONIA in the last 168 hours. Coagulation Profile: No results for input(s): INR, PROTIME in the last 168 hours. Cardiac Enzymes: No results for input(s): CKTOTAL, CKMB, CKMBINDEX, TROPONINI in the last 168 hours. BNP (last 3 results) No results for input(s): PROBNP in the last 8760 hours. HbA1C: No results for input(s): HGBA1C in the last 72 hours. CBG: Recent Labs  Lab 04/29/20 0720 04/29/20 1115 04/29/20 1638 04/29/20 2155 04/30/20 0804  GLUCAP 144* 224* 145* 150* 163*   Lipid Profile: No results for input(s): CHOL, HDL, LDLCALC, TRIG, CHOLHDL, LDLDIRECT in the last 72 hours. Thyroid Function Tests: No results for input(s): TSH, T4TOTAL, FREET4, T3FREE, THYROIDAB in the last 72 hours. Anemia Panel: No results for input(s): VITAMINB12, FOLATE, FERRITIN, TIBC, IRON, RETICCTPCT in the last 72 hours. Sepsis Labs: Recent Labs  Lab 04/25/20 1800  LATICACIDVEN 1.2    Recent Results (from the past 240 hour(s))  Culture, blood (routine x 2)     Status: None   Collection Time: 04/25/20  5:07 PM   Specimen: BLOOD  Result Value Ref Range Status   Specimen Description BLOOD LEFT ANTECUBITAL  Final   Special Requests   Final    BOTTLES DRAWN AEROBIC AND ANAEROBIC Blood Culture adequate volume   Culture   Final    NO GROWTH 5 DAYS Performed at [facility], [address] [place], [place] [address]    Report Status 04/30/2020 FINAL  Final  Respiratory Panel by RT PCR (Flu A&B, Covid) - Nasopharyngeal Swab     Status: None   Collection Time: 04/25/20  7:28 PM   Specimen: Nasopharyngeal Swab  Result Value Ref Range Status   SARS Coronavirus 2 by RT PCR NEGATIVE NEGATIVE Final    Comment: (NOTE) SARS-CoV-2 target nucleic acids are NOT DETECTED.  The SARS-CoV-2 RNA is generally detectable in upper respiratoy specimens during the acute phase of infection. The lowest concentration of SARS-CoV-2 viral copies this assay can detect is 131 copies/mL. A negative  result does not preclude SARS-Cov-2 infection and should not be used as the sole basis for treatment or other patient management decisions. A negative result may occur with  improper specimen collection/handling, submission of specimen other than nasopharyngeal swab, presence of viral mutation(s) within the areas targeted by this assay, and inadequate number of viral copies (<131 copies/mL). A negative result must be combined with clinical observations, patient history, and epidemiological information. The expected result is Negative.  Fact Sheet for Patients:  PinkCheek.be  Fact Sheet for Healthcare Providers:  GravelBags.it  This test is no t yet approved or cleared by the [place] FDA and  has been authorized for detection and/or diagnosis of SARS-CoV-2 by FDA under an Emergency Use Authorization (EUA). This EUA will remain  in effect (meaning this test can be used) for the duration of the COVID-19 declaration under Section 564(b)(1) of the Act, 21 U.S.C. section 360bbb-3(b)(1), unless the authorization is terminated or revoked sooner.     Influenza A by PCR NEGATIVE NEGATIVE Final   Influenza B by PCR NEGATIVE NEGATIVE Final    Comment: (NOTE) The Xpert Xpress SARS-CoV-2/FLU/RSV assay is intended as an aid in  the diagnosis of influenza from Nasopharyngeal swab specimens and  should not be used as a sole basis for treatment. Nasal washings and  aspirates are unacceptable for Xpert Xpress SARS-CoV-2/FLU/RSV  testing.  Fact Sheet for Patients: PinkCheek.be  Fact Sheet for Healthcare Providers: GravelBags.it  This test is not yet approved or cleared by the [place] FDA and  has been authorized for detection and/or diagnosis of SARS-CoV-2 by  FDA under an Emergency Use Authorization (EUA). This EUA will remain  in effect (meaning this test can be used)  for the duration of the  Covid-19 declaration under Section 564(b)(1) of the Act, 21  U.S.C. section 360bbb-3(b)(1), unless the authorization is  terminated or revoked. Performed at [facility], [place] [address] [place], [place] [address]   Culture, blood (routine x 2)     Status: None   Collection Time: 04/25/20  7:29 PM   Specimen: BLOOD  Result Value Ref Range Status   Specimen Description BLOOD SITE NOT SPECIFIED  Final   Special Requests   Final    BOTTLES DRAWN AEROBIC AND ANAEROBIC Blood Culture results may not be optimal due to an inadequate volume of blood received in culture bottles   Culture   Final    NO GROWTH 5 DAYS Performed at [facility], [place] [address] [place], [place] [address]    Report Status 04/30/2020 FINAL  Final  Urine culture     Status: None   Collection Time: 04/25/20 10:00 PM   Specimen: Urine, Random  Result Value Ref Range Status   Specimen Description URINE, RANDOM  Final   Special Requests NONE  Final   Culture   Final    NO GROWTH Performed at [facility], [place] [address], [place], [place] [address]    Report Status 04/27/2020 FINAL  Final         Radiology Studies: No results found.      Scheduled Meds: . sodium chloride   Intravenous Once  . acetaminophen  650 mg Oral Once  . ARIPiprazole  5 mg Oral Daily  . atorvastatin  80 mg Oral q1800  . calcium carbonate  1,000 mg of elemental calcium Oral QHS  . clopidogrel  75 mg Oral Daily  . heparin  5,000 Units Subcutaneous Q8H  .  insulin aspart  0-9 Units Subcutaneous TID WC  . mirtazapine  15 mg Oral QHS  . sodium bicarbonate  1,300 mg Oral TID  . venlafaxine XR  150 mg Oral Daily   Continuous Infusions: . magnesium sulfate bolus IVPB            Aline August, MD [facility] 04/30/2020, 9:27 AM

## 2020-04-30 NOTE — Progress Notes (Signed)
Patient ID: [name], female   DOB: 06/16/1941, 79 y.o.   MRN: [id number] [facility] Progress Note   Assessment/ Plan:   1.  Acute kidney injury on chronic kidney disease stage IV (basline creatinine 2.0-2.4): Underlying CKD appears likely from hypertension with hemodynamically mediated AKI from volume depletion in the setting of ongoing ACE-I/loop diuretic.  (corroborated by physical exam and low urine sodium/FeNa, bland UA).  CT abdomen did not show any hydronephrosis and showed complex left renal cyst.  She is recovering nicely and is trending towards baseline.  UOP is normal and she is taking good PO intake.   2.  Altered mental status: Greatly improved - thought to be multifactorial on admission. 3.  Anion gap metabolic acidosis: Secondary to acute kidney injury, improving - cont PO NaHCO3. 4.  Anemia:  Possibly associated with chronic kidney disease and notably with low iron saturation of 9% and a ferritin of 674.  S/P IV Fe and PRBCs as IVFs unmasked hemoconcentration and worse anemia.  Hb 8.2 today  No overt losses.  I will give a dose of ESA today - may benefit from ongoing administration, will be deferred to outpt setting.  5. Hypocalcemia: likely chronic and associated with secondary hyperparathyroidism- worsened with isotonic bicarbonate infusion. Normalized with QHS calcium. 7.  Complex renal cyst:  outpt f/u imaging.  Renal will sign off.   I will make sure she has f/u with Dr. Moshe Cipro for ongoing nephrology care. Call me with concerns.   Subjective:   Seen in room, remains awake and alert today.  Denies any bleeding. Says appetite ok and eating and drinking normally.   Documented UOP 1L yesterday. Noted assessment re: CIR admission.    Objective:   BP (!) 150/79 (BP Location: Left Arm)   Pulse 97   Temp 99.5 F (37.5 C) (Oral)   Resp 16   Wt 88.7 kg   SpO2 95%   BMI 30.18 kg/m   Intake/Output Summary (Last 24 hours) at 04/30/2020 0810 Last data  filed at 04/30/2020 0526 Gross per 24 hour  Intake 720 ml  Output 1000 ml  Net -280 ml   Weight change: -6.985 kg  Physical Exam: [id number] resting in bed,awake and alert OZD:GUYQI regular , s1 and s2 with SEM Resp:Decreased breath sounds over bases without rales/rhonchi HKV:QQVZ, obese, non-tender, bowel sounds normal Ext:No lower extremity edema.  Neuro: AOx3  Imaging: No results found.  Labs: BMET Recent Labs  Lab 04/26/20 1635 04/27/20 0230 04/27/20 1026 04/28/20 0316 04/28/20 0808 04/29/20 0324 04/30/20 0338  NA 136 147* 137 142 139 142 143  K 3.8 3.2* 3.0* 3.3* 3.7 3.6 4.4  CL 109 99 103 108 105 107 109  CO2 13* 13* 16* 19* 18* 20* 20*  GLUCOSE 141* 109* 195* 167* 176* 166* 149*  BUN 92* 90* 92* 82* 81* 71* 62*  CREATININE 4.89* 4.40* 4.28* 3.80* 3.91* 3.30* 2.83*  CALCIUM 8.3* 6.8* 8.0* 8.5* 9.0 8.8* 8.7*  PHOS  --  4.4 4.2 3.6 3.5 3.5  --    CBC Recent Labs  Lab 04/28/20 0316 04/28/20 0808 04/29/20 0324 04/30/20 0338  WBC 11.1* 10.9* 9.2 8.7  NEUTROABS 9.0* 8.9* 6.3 5.8  HGB 8.5* 9.3* 7.9* 8.2*  HCT 25.1* 28.6* 24.2* 24.8*  MCV 94.4 95.7 95.7 98.8  PLT 305 330 311 346    Medications:    . sodium chloride   Intravenous Once  . acetaminophen  650 mg Oral Once  . ARIPiprazole  5  mg Oral Daily  . atorvastatin  80 mg Oral q1800  . calcium carbonate  1,000 mg of elemental calcium Oral QHS  . clopidogrel  75 mg Oral Daily  . heparin  5,000 Units Subcutaneous Q8H  . insulin aspart  0-9 Units Subcutaneous TID WC  . mirtazapine  15 mg Oral QHS  . sodium bicarbonate  1,300 mg Oral TID   Jannifer Hick MD [facility] Pager [phone]

## 2020-04-30 NOTE — Progress Notes (Signed)
Inpatient Rehab Admissions Coordinator:   I was able to speak to pt's son this morning.  He is open to rehab, if needed, though recognizes that pt is progressing well with mobility and medical issues are resolving, which may mean she would not qualify for rehab.  We discussed insurance authorization and will see how she does with therapy today.  He did express concern that pt's mood and confusion seems worse this morning, and has observed this in the past when she was not taking her effexor (noted she has not had it since admission until this morning, and feels her mood is related to this).  Family is working to increase pt's assist at home for IADLs, but would not be able to provide 24/7 supervision, so pt would need to be able to reach at least intermittent supervision level.  Based on my conversation with her yesterday, I feel this is possible, as long as her confusion continues to resolve. Will continue to follow for therapy progress today and open insurance if appropriate.   Shann Medal, PT, DPT Admissions Coordinator [phone] 04/30/20  9:10 AM

## 2020-05-01 DIAGNOSIS — Z8673 Personal history of transient ischemic attack (TIA), and cerebral infarction without residual deficits: Secondary | ICD-10-CM | POA: Diagnosis not present

## 2020-05-01 DIAGNOSIS — G9340 Encephalopathy, unspecified: Secondary | ICD-10-CM | POA: Diagnosis not present

## 2020-05-01 DIAGNOSIS — I77810 Thoracic aortic ectasia: Secondary | ICD-10-CM | POA: Diagnosis not present

## 2020-05-01 DIAGNOSIS — N179 Acute kidney failure, unspecified: Secondary | ICD-10-CM | POA: Diagnosis not present

## 2020-05-01 LAB — BPAM RBC
Blood Product Expiration Date: 202110082359
Blood Product Expiration Date: 202110092359
Blood Product Expiration Date: 202110112359
Blood Product Expiration Date: 202110122359
ISSUE DATE / TIME: 202110052208
ISSUE DATE / TIME: 202110060200
Unit Type and Rh: 9500
Unit Type and Rh: 9500
Unit Type and Rh: 9500
Unit Type and Rh: 9500

## 2020-05-01 LAB — CBC WITH DIFFERENTIAL/PLATELET
Abs Immature Granulocytes: 0.41 10*3/uL — ABNORMAL HIGH (ref 0.00–0.07)
Basophils Absolute: 0 10*3/uL (ref 0.0–0.1)
Basophils Relative: 0 %
Eosinophils Absolute: 0.2 10*3/uL (ref 0.0–0.5)
Eosinophils Relative: 2 %
HCT: 24.6 % — ABNORMAL LOW (ref 36.0–46.0)
Hemoglobin: 7.8 g/dL — ABNORMAL LOW (ref 12.0–15.0)
Immature Granulocytes: 5 %
Lymphocytes Relative: 12 %
Lymphs Abs: 1 10*3/uL (ref 0.7–4.0)
MCH: 31.7 pg (ref 26.0–34.0)
MCHC: 31.7 g/dL (ref 30.0–36.0)
MCV: 100 fL (ref 80.0–100.0)
Monocytes Absolute: 0.8 10*3/uL (ref 0.1–1.0)
Monocytes Relative: 9 %
Neutro Abs: 6.1 10*3/uL (ref 1.7–7.7)
Neutrophils Relative %: 72 %
Platelets: 334 10*3/uL (ref 150–400)
RBC: 2.46 MIL/uL — ABNORMAL LOW (ref 3.87–5.11)
RDW: 13.8 % (ref 11.5–15.5)
WBC: 8.5 10*3/uL (ref 4.0–10.5)
nRBC: 0 % (ref 0.0–0.2)

## 2020-05-01 LAB — TYPE AND SCREEN
ABO/RH(D): O NEG
Antibody Screen: NEGATIVE
Unit division: 0
Unit division: 0
Unit division: 0
Unit division: 0

## 2020-05-01 LAB — BASIC METABOLIC PANEL
Anion gap: 11 (ref 5–15)
BUN: 49 mg/dL — ABNORMAL HIGH (ref 8–23)
CO2: 23 mmol/L (ref 22–32)
Calcium: 8.7 mg/dL — ABNORMAL LOW (ref 8.9–10.3)
Chloride: 107 mmol/L (ref 98–111)
Creatinine, Ser: 2.58 mg/dL — ABNORMAL HIGH (ref 0.44–1.00)
GFR calc non Af Amer: 17 mL/min — ABNORMAL LOW (ref >60)
Glucose, Bld: 130 mg/dL — ABNORMAL HIGH (ref 70–99)
Potassium: 3.8 mmol/L (ref 3.5–5.1)
Sodium: 141 mmol/L (ref 135–145)

## 2020-05-01 LAB — GLUCOSE, CAPILLARY
Glucose-Capillary: 186 mg/dL — ABNORMAL HIGH (ref 70–99)
Glucose-Capillary: 142 mg/dL — ABNORMAL HIGH (ref 70–99)
Glucose-Capillary: 115 mg/dL — ABNORMAL HIGH (ref 70–99)
Glucose-Capillary: 101 mg/dL — ABNORMAL HIGH (ref 70–99)

## 2020-05-01 LAB — MAGNESIUM: Magnesium: 1.8 mg/dL (ref 1.7–2.4)

## 2020-05-01 MED ORDER — MAGNESIUM OXIDE 400 (241.3 MG) MG PO TABS
400.0000 mg | ORAL_TABLET | Freq: Two times a day (BID) | ORAL | Status: DC
  Administered 2020-05-01 – 2020-05-05 (×9): 400 mg via ORAL
  Filled 2020-05-01 (×9): qty 1

## 2020-05-01 NOTE — Progress Notes (Signed)
Inpatient Rehab Admissions Coordinator:   Awaiting determination from insurance regarding CIR prior auth request.  Will continue to follow.   Shann Medal, PT, DPT Admissions Coordinator [phone] 05/01/20  3:08 PM

## 2020-05-01 NOTE — Progress Notes (Signed)
Physical Therapy Treatment Patient Details Name: [name] MRN: [id number] DOB: [birth year] Today's Date: 05/01/2020    History of Present Illness 79 yo female with onset of AKI and AMS was brought to ED, noted atelectasis, cholelithiasis, acute encephalopathy, SIRS, and concerns about renal mass.  PMHx:  R eye macular degeneration, CVA 2019, colon CA, radiation therapy, thyroid nodule, atherosclerosis, HTN, CKD4, stroke    PT Comments    Pt was seen for updating of mobility and is progressing with safety and endurance, and is motivated to do more every session.  Her endurance is indicative of being appropriate for CIR, and should be able to tolerate the expectations of the program.  Her strength and balance will be focus of acute therapy to enhance independence and quality of gait.  Follow Up Recommendations  CIR     Equipment Recommendations  None recommended by PT    Recommendations for Other Services Rehab consult     Precautions / Restrictions Precautions Precautions: Fall Precaution Comments: ck vitals Restrictions Weight Bearing Restrictions: No    Mobility  Bed Mobility               General bed mobility comments: up in chair when PT arrives  Transfers Overall transfer level: Needs assistance Equipment used: Rolling walker (2 wheeled) Transfers: Sit to/from Stand Sit to Stand: Min guard Stand pivot transfers: Supervision       General transfer comment: 10 reps stands end of session with supervisoin  Ambulation/Gait Ambulation/Gait assistance: Min guard Gait Distance (Feet): 40 Feet Assistive device: Rolling walker (2 wheeled) Gait Pattern/deviations: Step-through pattern;Decreased stride length   Gait velocity interpretation: <1.8 ft/sec, indicate of risk for recurrent falls General Gait Details: better control of knees today   Stairs             Wheelchair Mobility    Modified Rankin (Stroke Patients Only)       Balance Overall  balance assessment: Needs assistance Sitting-balance support: Feet supported Sitting balance-Leahy Scale: Good       Standing balance-Leahy Scale: Fair Standing balance comment: RW needed for balance dynamically                            Cognition Arousal/Alertness: Awake/alert Behavior During Therapy: WFL for tasks assessed/performed Overall Cognitive Status: Within Functional Limits for tasks assessed                                        Exercises General Exercises - Lower Extremity Long Arc Quad: Strengthening;10 reps Heel Slides: Strengthening;10 reps Hip ABduction/ADduction: 10 reps;Strengthening Hip Flexion/Marching: AROM;10 reps    General Comments General comments (skin integrity, edema, etc.): transfers with help and repositioned on chair with pillows and legs elevated      Pertinent Vitals/Pain Pain Assessment: No/denies pain    Home Living                      Prior Function            PT Goals (current goals can now be found in the care plan section) Acute Rehab PT Goals Patient Stated Goal: to get stronger and go back home Progress towards PT goals: Progressing toward goals    Frequency    Min 4X/week      PT Plan Current plan remains appropriate    Co-evaluation  AM-PAC PT "6 Clicks" Mobility   Outcome Measure  Help needed turning from your back to your side while in a flat bed without using bedrails?: A Little Help needed moving from lying on your back to sitting on the side of a flat bed without using bedrails?: A Little Help needed moving to and from a bed to a chair (including a wheelchair)?: A Little Help needed standing up from a chair using your arms (e.g., wheelchair or bedside chair)?: A Little Help needed to walk in hospital room?: A Little Help needed climbing 3-5 steps with a railing? : A Lot 6 Click Score: 17    End of Session Equipment Utilized During Treatment: Gait  belt Activity Tolerance: Patient limited by fatigue;Treatment limited secondary to medical complications (Comment) (pulse 111 post gait) Patient left: in chair;with call bell/phone within reach;with chair alarm set Nurse Communication: Mobility status PT Visit Diagnosis: Unsteadiness on feet (R26.81);Muscle weakness (generalized) (M62.81);Difficulty in walking, not elsewhere classified (R26.2)     Time: 3276-1470 PT Time Calculation (min) (ACUTE ONLY): 30 min  Charges:  $Gait Training: 8-22 mins $Therapeutic Exercise: 8-22 mins               Ramond Dial 05/01/2020, 10:29 PM  [name], PT MS Acute Rehab Dept. Number: [place] and [place]

## 2020-05-01 NOTE — Progress Notes (Signed)
Occupational Therapy Treatment Patient Details Name: [name] MRN: [id number] DOB: Mar 25, 1941 Today's Date: 05/01/2020    History of present illness 79 yo female with onset of AKI and AMS was brought to ED, noted atelectasis, cholelithiasis, acute encephalopathy, SIRS, and concerns about renal mass.  PMHx:  R eye macular degeneration, CVA 2019, colon CA, radiation therapy, thyroid nodule, atherosclerosis, HTN, CKD4, stroke   OT comments  Patient seen this date for mobility related to toilet skills, stand grooming task and LB dressing.  Patient able to complete all tasks with minimal setup and supervision.  Patient was able to mobilize in the room this date without an AD, and demonstrated no LOB and no safety deficits.  Son was observing treatment, stating that she is close to her baseline.  Son still noted some  Confusion this am per CIR note, but otherwise, no real deficits noted during this session.  OT to continue to follow in the acute setting to ensure safety and independence with self care, functional mobility and toilet skills.  CIR is following.   Follow Up Recommendations  CIR;Supervision - Intermittent    Equipment Recommendations  3 in 1 bedside commode    Recommendations for Other Services      Precautions / Restrictions Precautions Precautions: Fall Restrictions Weight Bearing Restrictions: No       Mobility Bed Mobility Overal bed mobility: Needs Assistance   Rolling: Modified independent (Device/Increase time)   Supine to sit: Modified independent (Device/Increase time);HOB elevated Sit to supine: Modified independent (Device/Increase time);HOB elevated      Transfers     Transfers: Sit to/from Stand Sit to Stand: Supervision Stand pivot transfers: Supervision            Balance   Sitting-balance support: Feet supported Sitting balance-Leahy Scale: Good     Standing balance support: No upper extremity supported Standing balance-Leahy Scale:  Fair                             ADL either performed or assessed with clinical judgement   ADL       Grooming: Set up;Standing               Lower Body Dressing: Set up;Sit to/from stand   Toilet Transfer: Supervision/safety;Ambulation Toilet Transfer Details (indicate cue type and reason): No AD Toileting- Clothing Manipulation and Hygiene: Independent;Sit to/from stand       Functional mobility during ADLs: Supervision/safety General ADL Comments: mobility in the room without AD                       Cognition Arousal/Alertness: Awake/alert Behavior During Therapy: WFL for tasks assessed/performed Overall Cognitive Status: Within Functional Limits for tasks assessed                                 General Comments: Son states she is at her baseline.        Exercises                  Pertinent Vitals/ Pain       Pain Assessment: No/denies pain  Frequency  Min 2X/week        Progress Toward Goals  OT Goals(current goals can now be found in the care plan section)  Progress towards OT goals: Progressing toward goals  Acute Rehab OT Goals Patient Stated Goal: to get stronger and go back home OT Goal Formulation: With patient Time For Goal Achievement: 05/12/20 Potential to Achieve Goals: Good  Plan Discharge plan remains appropriate    Co-evaluation                 AM-PAC OT "6 Clicks" Daily Activity     Outcome Measure   Help from another person eating meals?: None Help from another person taking care of personal grooming?: None Help from another person toileting, which includes using toliet, bedpan, or urinal?: None Help from another person bathing (including washing, rinsing, drying)?: A Little Help from another person to put on and taking off regular upper body clothing?: A Little Help from another person to put on  and taking off regular lower body clothing?: A Little 6 Click Score: 21    End of Session    OT Visit Diagnosis: Unsteadiness on feet (R26.81);Muscle weakness (generalized) (M62.81)   Activity Tolerance Patient tolerated treatment well   Patient Left in bed;with bed alarm set;with family/visitor present   Nurse Communication Mobility status        Time: 0677-0340 OT Time Calculation (min): 19 min  Charges: OT General Charges $OT Visit: 1 Visit OT Treatments $Self Care/Home Management : 8-22 mins  05/01/2020  Rich, OTR/L  Acute Rehabilitation Services  Office:  [phone]    [name] 05/01/2020, 4:10 PM

## 2020-05-01 NOTE — Progress Notes (Signed)
Patient ID: [name], female   DOB: 1941/01/08, 79 y.o.   MRN: [id number]  PROGRESS NOTE    [name]  WUJ:[id number] DOB: 1941-07-11 DOA: 04/25/2020 PCP: Makayla Infante, MD   Brief Narrative:  79 y.o.femalewith medical history significant fortype 2 diabetes, HTN, HLD, CKD stage IV, history of unspecified CVA, and depression/anxiety was admitted with acute encephalopathy, SIRS, acute kidney injury on chronic kidney disease stage IV secondary to combined volume depletion and possible ATN, cholelithiasis with pericholecystic fluid.  She was also started on broad-spectrum antibiotics.  On presentation, creatinine was 5.3, baseline creatinine of 1.97.  CT of the head was negative for acute abnormality.  CT chest revealed 4 cm ascending aortic aneurysm and 2.4 cm left thyroid nodule.  Right upper quadrant ultrasound showed cholelithiasis with pericholecystic fluid but no evidence of cholecystitis.  Nephrology was consulted.  Currently, cardiology was consulted for PVCs/?  Nonsustained VT's.  PT recommended CIR placement.  Assessment & Plan:   Acute metabolic encephalopathy ?  SIRS -Patient presented with acute encephalopathy with concern for some kind of infection.  She was started on broad-spectrum antibiotics.  COVID-19 testing on presentation was negative.  Cultures have been negative so far. -Subsequently, mental status is much improved.  Still slow to respond. -No evidence of infection.  Antibiotics discontinued on 04/28/2020 after discussion with patient's son -PT is recommending CIR placement.  CIR following.  AKI on CKD stage IV Acute metabolic acidosis -Prior creatinine was 1.97 2 years ago.  Creatinine 5.52 on presentation. -Likely from combination of hypovolemia and ATN -Nephrology following.  IV fluids discontinued on 04/29/2020 by nephrology.  Acidosis resolved.  Currently on sodium bicarbonate tablets. -Creatinine 2.58 today. -Lasix and lisinopril  held.   Cholelithiasis -Cholelithiasis with mild amount of pericholecystic fluid on right upper quadrant ultrasound without evidence of acute cholecystitis. -Currently does not have any abdominal pain.  Only mildly elevated LFTs on presentation.  Will repeat LFTs in a.m. -No need for HIDA scan at this time.  Frequent PVCs/nonsustained V. Tach -Cardiology has signed off.  Recommend potassium and magnesium to be above 4 and 2 respectively.  No further work-up recommended.  Echo showed EF of 60 to 65%.  Diabetes mellitus type 2 -Uncontrolled with hyperglycemia.  Continue CBGs with SSI  Hypertension -Monitor blood pressure.  Lisinopril and Lasix on hold  History of unspecified CVA -Continue Plavix and statin  Hyperlipidemia -Continue statin  Anemia of chronic disease -Hemoglobin stable, 7.8 today.  Transfuse if hemoglobin is less than 7.  Left thyroid nodule -2.4 cm thyroid nodule seen on CT imaging.  Consider follow-up thyroid ultrasound as an outpatient.  Ascending aortic aneurysm -4 cm ascending aortic aneurysm seen on CT.  Annual follow-up by CTA or MRA recommended.  Outpatient follow-up.  Recommend outpatient CT surgery evaluation  Complex right renal mass -Seen on abdominal ultrasound. -May be renal cyst seen on prior renal ultrasound however correlation with contrast-enhanced CT abdomen/pelviswasrecommended. Cannot obtain contrast imagingat this time due to AKI. Will need referral to urology when patient is stabilized.  Depression/anxiety -Continue Abilify and Remeron along with as needed Ativan.  Effexor was resumed on 04/30/2020 as well.  Generalized deconditioning/weakness -PT/OT recommend CIR placement.  CIR consulted.  PT is still recommending CIR placement.  Communicated with son/Dr. Phylliss Knapp on 04/30/2020 who is agreeable to proceed with CIR placement.  DVT prophylaxis: Subcutaneous heparin Code Status: Full Family Communication: Spoke to son/Dr. Phylliss Knapp on 04/29/2020. Disposition Plan: Status is: Inpatient  Remains inpatient appropriate  because:Inpatient level of care appropriate due to severity of illness.  CIR once bed is available   Dispo: The patient is from: Home              Anticipated d/c is to: CIR               Anticipated d/c date is: 1 day              Patient currently is medically stable to d/c.   Consultants: Nephrology/cardiology  Procedures: Echo as above  Antimicrobials:  Anti-infectives (From admission, onward)   Start     Dose/Rate Route Frequency Ordered Stop   04/26/20 2000  piperacillin-tazobactam (ZOSYN) IVPB 2.25 g  Status:  Discontinued        2.25 g 100 mL/hr over 30 Minutes Intravenous Every 8 hours 04/26/20 1116 04/28/20 2032   04/26/20 1030  piperacillin-tazobactam (ZOSYN) IVPB 3.375 g        3.375 g 100 mL/hr over 30 Minutes Intravenous  Once 04/26/20 1016 04/26/20 1239       Subjective: Patient seen and examined at bedside. Denies worsening shortness of breath, fever or vomiting.  Still slow to respond. Objective: Vitals:   04/30/20 1306 04/30/20 1952 05/01/20 0148 05/01/20 0500  BP: (!) 149/77 (!) 144/69 (!) 156/84 136/83  Pulse: 96 88 89 93  Resp: 16 17 16 18   Temp: 98.8 F (37.1 C) 98.9 F (37.2 C) 98.9 F (37.2 C) 98.2 F (36.8 C)  TempSrc:  Oral Oral Oral  SpO2: 97% 96% 93% 95%  Weight:    99.3 kg    Intake/Output Summary (Last 24 hours) at 05/01/2020 0751 Last data filed at 04/30/2020 2127 Gross per 24 hour  Intake 360 ml  Output 450 ml  Net -90 ml   Filed Weights   04/29/20 0450 04/30/20 0441 05/01/20 0500  Weight: 95.7 kg 88.7 kg 99.3 kg    Examination:  General exam: No distress.  Elderly female sitting on chair. Respiratory system: Bilateral decreased breath sounds at bases with scattered crackles  cardiovascular system: S1-S2 heard, rate controlled Gastrointestinal system: Abdomen is nondistended, soft and nontender.  Normal bowel sounds heard.    Extremities: No cyanosis.  Trace bilateral lower extremity edema present.   Data Reviewed: I have personally reviewed following labs and imaging studies  CBC: Recent Labs  Lab 04/28/20 0316 04/28/20 0808 04/29/20 0324 04/30/20 0338 05/01/20 0445  WBC 11.1* 10.9* 9.2 8.7 8.5  NEUTROABS 9.0* 8.9* 6.3 5.8 6.1  HGB 8.5* 9.3* 7.9* 8.2* 7.8*  HCT 25.1* 28.6* 24.2* 24.8* 24.6*  MCV 94.4 95.7 95.7 98.8 100.0  PLT 305 330 311 346 989   Basic Metabolic Panel: Recent Labs  Lab 04/27/20 0230 04/27/20 0230 04/27/20 1026 04/27/20 1026 04/28/20 0316 04/28/20 0808 04/29/20 0324 04/30/20 0338 05/01/20 0445  NA 147*   < > 137   < > 142 139 142 143 141  K 3.2*   < > 3.0*   < > 3.3* 3.7 3.6 4.4 3.8  CL 99   < > 103   < > 108 105 107 109 107  CO2 13*   < > 16*   < > 19* 18* 20* 20* 23  GLUCOSE 109*   < > 195*   < > 167* 176* 166* 149* 130*  BUN 90*   < > 92*   < > 82* 81* 71* 62* 49*  CREATININE 4.40*   < > 4.28*   < > 3.80* 3.91* 3.30*  2.83* 2.58*  CALCIUM 6.8*   < > 8.0*   < > 8.5* 9.0 8.8* 8.7* 8.7*  MG 1.7   < > 2.0   < > 1.8 2.1 1.8 1.7 1.8  PHOS 4.4  --  4.2  --  3.6 3.5 3.5  --   --    < > = values in this interval not displayed.   GFR: CrCl cannot be calculated (Unknown ideal weight.). Liver Function Tests: Recent Labs  Lab 04/25/20 1639 04/25/20 1639 04/26/20 0500 04/26/20 0500 04/27/20 0230 04/27/20 1026 04/28/20 0316 04/28/20 0808 04/29/20 0324  AST 65*  --  76*  --   --   --   --   --   --   ALT 49*  --  53*  --   --   --   --   --   --   ALKPHOS 123  --  132*  --   --   --   --   --   --   BILITOT 1.3*  --  1.1  --   --   --   --   --   --   PROT 6.7  --  6.4*  --   --   --   --   --   --   ALBUMIN 2.7*   < > 2.5*   < > 2.0* 2.0* 1.9* 2.1* 1.9*   < > = values in this interval not displayed.   Recent Labs  Lab 04/26/20 0500  LIPASE 53*   No results for input(s): AMMONIA in the last 168 hours. Coagulation Profile: No results for input(s): INR, PROTIME  in the last 168 hours. Cardiac Enzymes: No results for input(s): CKTOTAL, CKMB, CKMBINDEX, TROPONINI in the last 168 hours. BNP (last 3 results) No results for input(s): PROBNP in the last 8760 hours. HbA1C: No results for input(s): HGBA1C in the last 72 hours. CBG: Recent Labs  Lab 04/29/20 2155 04/30/20 0804 04/30/20 1120 04/30/20 1648 04/30/20 2124  GLUCAP 150* 163* 184* 125* 128*   Lipid Profile: No results for input(s): CHOL, HDL, LDLCALC, TRIG, CHOLHDL, LDLDIRECT in the last 72 hours. Thyroid Function Tests: No results for input(s): TSH, T4TOTAL, FREET4, T3FREE, THYROIDAB in the last 72 hours. Anemia Panel: No results for input(s): VITAMINB12, FOLATE, FERRITIN, TIBC, IRON, RETICCTPCT in the last 72 hours. Sepsis Labs: Recent Labs  Lab 04/25/20 1800  LATICACIDVEN 1.2    Recent Results (from the past 240 hour(s))  Culture, blood (routine x 2)     Status: None   Collection Time: 04/25/20  5:07 PM   Specimen: BLOOD  Result Value Ref Range Status   Specimen Description BLOOD LEFT ANTECUBITAL  Final   Special Requests   Final    BOTTLES DRAWN AEROBIC AND ANAEROBIC Blood Culture adequate volume   Culture   Final    NO GROWTH 5 DAYS Performed at [facility], [address], [place], [place] [address]    Report Status 04/30/2020 FINAL  Final  Respiratory Panel by RT PCR (Flu A&B, Covid) - Nasopharyngeal Swab     Status: None   Collection Time: 04/25/20  7:28 PM   Specimen: Nasopharyngeal Swab  Result Value Ref Range Status   SARS Coronavirus 2 by RT PCR NEGATIVE NEGATIVE Final    Comment: (NOTE) SARS-CoV-2 target nucleic acids are NOT DETECTED.  The SARS-CoV-2 RNA is generally detectable in upper respiratoy specimens during the acute phase of infection. The lowest concentration  of SARS-CoV-2 viral copies this assay can detect is 131 copies/mL. A negative result does not preclude SARS-Cov-2 infection and should not be used as the sole basis for treatment  or other patient management decisions. A negative result may occur with  improper specimen collection/handling, submission of specimen other than nasopharyngeal swab, presence of viral mutation(s) within the areas targeted by this assay, and inadequate number of viral copies (<131 copies/mL). A negative result must be combined with clinical observations, patient history, and epidemiological information. The expected result is Negative.  Fact Sheet for Patients:  PinkCheek.be  Fact Sheet for Healthcare Providers:  GravelBags.it  This test is no t yet approved or cleared by the [place] FDA and  has been authorized for detection and/or diagnosis of SARS-CoV-2 by FDA under an Emergency Use Authorization (EUA). This EUA will remain  in effect (meaning this test can be used) for the duration of the COVID-19 declaration under Section 564(b)(1) of the Act, 21 U.S.C. section 360bbb-3(b)(1), unless the authorization is terminated or revoked sooner.     Influenza A by PCR NEGATIVE NEGATIVE Final   Influenza B by PCR NEGATIVE NEGATIVE Final    Comment: (NOTE) The Xpert Xpress SARS-CoV-2/FLU/RSV assay is intended as an aid in  the diagnosis of influenza from Nasopharyngeal swab specimens and  should not be used as a sole basis for treatment. Nasal washings and  aspirates are unacceptable for Xpert Xpress SARS-CoV-2/FLU/RSV  testing.  Fact Sheet for Patients: PinkCheek.be  Fact Sheet for Healthcare Providers: GravelBags.it  This test is not yet approved or cleared by the [place] FDA and  has been authorized for detection and/or diagnosis of SARS-CoV-2 by  FDA under an Emergency Use Authorization (EUA). This EUA will remain  in effect (meaning this test can be used) for the duration of the  Covid-19 declaration under Section 564(b)(1) of the Act, 21  U.S.C.  section 360bbb-3(b)(1), unless the authorization is  terminated or revoked. Performed at [facility], [facility] [address], [place], [place] [address]   Culture, blood (routine x 2)     Status: None   Collection Time: 04/25/20  7:29 PM   Specimen: BLOOD  Result Value Ref Range Status   Specimen Description BLOOD SITE NOT SPECIFIED  Final   Special Requests   Final    BOTTLES DRAWN AEROBIC AND ANAEROBIC Blood Culture results may not be optimal due to an inadequate volume of blood received in culture bottles   Culture   Final    NO GROWTH 5 DAYS Performed at [facility], [place] [address] [place], [place] [address]    Report Status 04/30/2020 FINAL  Final  Urine culture     Status: None   Collection Time: 04/25/20 10:00 PM   Specimen: Urine, Random  Result Value Ref Range Status   Specimen Description URINE, RANDOM  Final   Special Requests NONE  Final   Culture   Final    NO GROWTH Performed at [facility], [place] [address], [place], [place] [address]    Report Status 04/27/2020 FINAL  Final         Radiology Studies: No results found.      Scheduled Meds: . sodium chloride   Intravenous Once  . acetaminophen  650 mg Oral Once  . ARIPiprazole  5 mg Oral Daily  . atorvastatin  80 mg Oral q1800  . calcium carbonate  1,000 mg of elemental calcium Oral QHS  . clopidogrel  75 mg Oral Daily  . heparin  5,000 Units Subcutaneous Q8H  . influenza vaccine adjuvanted  0.5 mL Intramuscular Tomorrow-1000  . insulin aspart  0-9 Units Subcutaneous TID WC  . mirtazapine  15 mg Oral QHS  . sodium bicarbonate  1,300 mg Oral TID  . venlafaxine XR  150 mg Oral Daily   Continuous Infusions:         Aline August, MD [facility] 05/01/2020, 7:51 AM

## 2020-05-01 NOTE — Care Management Important Message (Addendum)
Important Message  Patient Details  Name: [name] MRN: [id number] Date of Birth: [birth year]   Medicare Important Message Given:  Yes Pt. Refused to sign IM letter.     Holli Humbles Smith 05/01/2020, 10:53 AM

## 2020-05-02 DIAGNOSIS — G9340 Encephalopathy, unspecified: Secondary | ICD-10-CM | POA: Diagnosis not present

## 2020-05-02 DIAGNOSIS — I77810 Thoracic aortic ectasia: Secondary | ICD-10-CM | POA: Diagnosis not present

## 2020-05-02 DIAGNOSIS — Z8673 Personal history of transient ischemic attack (TIA), and cerebral infarction without residual deficits: Secondary | ICD-10-CM | POA: Diagnosis not present

## 2020-05-02 DIAGNOSIS — N179 Acute kidney failure, unspecified: Secondary | ICD-10-CM | POA: Diagnosis not present

## 2020-05-02 LAB — CBC WITH DIFFERENTIAL/PLATELET
Abs Immature Granulocytes: 0.34 10*3/uL — ABNORMAL HIGH (ref 0.00–0.07)
Basophils Absolute: 0 10*3/uL (ref 0.0–0.1)
Basophils Relative: 0 %
Eosinophils Absolute: 0.2 10*3/uL (ref 0.0–0.5)
Eosinophils Relative: 2 %
HCT: 25.4 % — ABNORMAL LOW (ref 36.0–46.0)
Hemoglobin: 7.9 g/dL — ABNORMAL LOW (ref 12.0–15.0)
Immature Granulocytes: 4 %
Lymphocytes Relative: 14 %
Lymphs Abs: 1.2 10*3/uL (ref 0.7–4.0)
MCH: 31 pg (ref 26.0–34.0)
MCHC: 31.1 g/dL (ref 30.0–36.0)
MCV: 99.6 fL (ref 80.0–100.0)
Monocytes Absolute: 0.8 10*3/uL (ref 0.1–1.0)
Monocytes Relative: 9 %
Neutro Abs: 6.1 10*3/uL (ref 1.7–7.7)
Neutrophils Relative %: 71 %
Platelets: 339 10*3/uL (ref 150–400)
RBC: 2.55 MIL/uL — ABNORMAL LOW (ref 3.87–5.11)
RDW: 13.6 % (ref 11.5–15.5)
WBC: 8.6 10*3/uL (ref 4.0–10.5)
nRBC: 0 % (ref 0.0–0.2)

## 2020-05-02 LAB — BASIC METABOLIC PANEL
Anion gap: 11 (ref 5–15)
BUN: 46 mg/dL — ABNORMAL HIGH (ref 8–23)
CO2: 24 mmol/L (ref 22–32)
Calcium: 8.6 mg/dL — ABNORMAL LOW (ref 8.9–10.3)
Chloride: 106 mmol/L (ref 98–111)
Creatinine, Ser: 2.55 mg/dL — ABNORMAL HIGH (ref 0.44–1.00)
GFR calc non Af Amer: 17 mL/min — ABNORMAL LOW (ref >60)
Glucose, Bld: 111 mg/dL — ABNORMAL HIGH (ref 70–99)
Potassium: 3.8 mmol/L (ref 3.5–5.1)
Sodium: 141 mmol/L (ref 135–145)

## 2020-05-02 LAB — GLUCOSE, CAPILLARY
Glucose-Capillary: 90 mg/dL (ref 70–99)
Glucose-Capillary: 160 mg/dL — ABNORMAL HIGH (ref 70–99)
Glucose-Capillary: 109 mg/dL — ABNORMAL HIGH (ref 70–99)
Glucose-Capillary: 104 mg/dL — ABNORMAL HIGH (ref 70–99)

## 2020-05-02 LAB — MAGNESIUM: Magnesium: 1.7 mg/dL (ref 1.7–2.4)

## 2020-05-02 MED ORDER — ALUM & MAG HYDROXIDE-SIMETH 200-200-20 MG/5ML PO SUSP
30.0000 mL | ORAL | Status: DC | PRN
  Filled 2020-05-02: qty 30

## 2020-05-02 NOTE — NC FL2 (Signed)
[place] LEVEL OF CARE SCREENING TOOL     IDENTIFICATION  Patient Name: [name] Birthdate: [birth year] Sex: female Admission Date (Current Location): 04/25/2020  [facility] and [place] Number:  Herbalist and Address:  [facility] [place], [place] [address] [place], [place] [address]      Provider Number: [id number]  Attending Physician Name and Address:  Aline August, MD  Relative Name and Phone Number:  Lauro Regulus, [phone]    Current Level of Care: Hospital Recommended Level of Care: [employer] Prior Approval Number:    Date Approved/Denied:   PASRR Number: Pending  Discharge Plan: SNF    Current Diagnoses: Patient Active Problem List   Diagnosis Date Noted  . Acute encephalopathy 04/26/2020  . Acute kidney injury superimposed on CKD ([place]) 04/25/2020  . History of CVA (cerebrovascular accident) 04/25/2020  . Depression with anxiety 04/25/2020  . 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 Left thyroid nodule 04/25/2020  . Right renal mass 04/25/2020  . 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 Labile blood pressure   . Elevated blood pressure reading   . CKD (chronic kidney disease), stage III ([place])   . Hyperlipidemia associated with type 2 diabetes mellitus ([place])   . Small vessel disease ([place]) 03/28/2018  . Type 2 diabetes mellitus ([place])   . Anxiety state   . Stage 3 chronic kidney disease ([place])   . Hyperlipidemia   . History of breast cancer   . Acute ischemic stroke ([place])   . TIA (transient ischemic attack) 03/24/2018  . Dyspnea on exertion 05/30/2017  . Sinus tachycardia 05/30/2017  . Mixed hyperlipidemia 05/30/2017  . Hypertension associated with diabetes ([place]) 05/30/2017    Orientation RESPIRATION BLADDER Height & Weight     Self, Time, Situation, Place  Normal Incontinent Weight: 217 lb 13 oz (98.8 kg) Height:     BEHAVIORAL SYMPTOMS/MOOD NEUROLOGICAL BOWEL NUTRITION STATUS       Continent Diet (Please see DC Summary for dose)  AMBULATORY STATUS COMMUNICATION OF NEEDS Skin   Limited Assist Verbally Other (Comment) (Moisture associated skin damage on groin)                       Personal Care Assistance Level of Assistance  Bathing, Feeding, Dressing Bathing Assistance: Limited assistance Feeding assistance: Independent Dressing Assistance: Limited assistance     Functional Limitations Info  Sight Sight Info: Impaired        SPECIAL CARE FACTORS FREQUENCY  PT (By licensed PT), OT (By licensed OT)     PT Frequency: 5x/week OT Frequency: 5x/week            Contractures Contractures Info: Not present    Additional Factors Info  Code Status, Allergies, Psychotropic, Insulin Sliding Scale Code Status Info: Full Allergies Info: NKA Psychotropic Info: Abilify; Effexor Insulin Sliding Scale Info: See dc summary for dose       Current Medications (05/02/2020):  This is the current hospital active medication list Current Facility-Administered Medications  Medication Dose Route Frequency Provider Last Rate Last Admin  . 0.9 %  sodium chloride infusion (Manually program via Guardrails IV Fluids)   Intravenous Once Chotiner, Yevonne Aline, MD      . acetaminophen (TYLENOL) tablet 650 mg  650 mg Oral Q6H PRN Lenore Cordia, MD   650 mg at 04/30/20 1304   Or  . acetaminophen (TYLENOL) suppository 650 mg  650 mg Rectal Q6H PRN Zada Finders  R, MD      . acetaminophen (TYLENOL) tablet 650 mg  650 mg Oral Once Chotiner, Yevonne Aline, MD      . ARIPiprazole (ABILIFY) tablet 5 mg  5 mg Oral Daily Zada Finders R, MD   5 mg at 05/02/20 4270  . atorvastatin (LIPITOR) tablet 80 mg  80 mg Oral q1800 Lenore Cordia, MD   80 mg at 05/01/20 1727  . calcium carbonate (OS-CAL - dosed in mg of elemental calcium) tablet 1,000 mg of elemental calcium  1,000 mg of elemental calcium Oral QHS Elmarie Shiley, MD   1,000 mg of elemental calcium at 05/01/20 2239  . clopidogrel  (PLAVIX) tablet 75 mg  75 mg Oral Daily Zada Finders R, MD   75 mg at 05/02/20 0812  . heparin injection 5,000 Units  5,000 Units Subcutaneous Q8H Lenore Cordia, MD   5,000 Units at 05/02/20 6237  . insulin aspart (novoLOG) injection 0-9 Units  0-9 Units Subcutaneous TID WC Lenore Cordia, MD   2 Units at 05/02/20 1137  . LORazepam (ATIVAN) tablet 0.5 mg  0.5 mg Oral Q6H PRN Zada Finders R, MD   0.5 mg at 05/01/20 1727  . magnesium oxide (MAG-OX) tablet 400 mg  400 mg Oral BID Aline August, MD   400 mg at 05/02/20 0811  . mirtazapine (REMERON) tablet 15 mg  15 mg Oral QHS Lenore Cordia, MD   15 mg at 05/01/20 2239  . ondansetron (ZOFRAN) tablet 4 mg  4 mg Oral Q6H PRN Lenore Cordia, MD       Or  . ondansetron (ZOFRAN) injection 4 mg  4 mg Intravenous Q6H PRN Zada Finders R, MD      . venlafaxine XR (EFFEXOR-XR) 24 hr capsule 150 mg  150 mg Oral Daily Aline August, MD   150 mg at 05/02/20 1105     Discharge Medications: Please see discharge summary for a list of discharge medications.  Relevant Imaging Results:  Relevant Lab Results:   Additional Information SSN: [id number]  Benard [place], LCSW

## 2020-05-02 NOTE — Progress Notes (Signed)
PT Cancellation Note  Patient Details Name: [name] MRN: [id number] DOB: [birth year]   Cancelled Treatment:    Reason Eval/Treat Not Completed: Other (comment).  Pt is tired today, asking to hold PT.  Per SW [employer] needs a note for SNF stay, and will try to have her on therapy caseload tomorrow.   Ramond Dial 05/02/2020, 4:34 PM   [name], PT MS Acute Rehab Dept. Number: [place] and [place]

## 2020-05-02 NOTE — TOC Initial Note (Signed)
Transition of Care [facility]) - Initial/Assessment Note    Patient Details  Name: [name] MRN: [id number] Date of Birth: March 23, 1941  [facility]) CM/SW Contact:    Trula Ore, [place] Phone Number: 05/02/2020, 4:04 PM  Clinical Narrative:                  CSW received consult for possible SNF placement at time of discharge. CSW spoke with patient and patients son Elta Guadeloupe regarding PT recommendation of SNF placement at time of discharge.Patient and patients son expressed understanding of PT recommendation and is agreeable to SNF placement at time of discharge. Patient and patients son was agreeable to [facility] faxing out initial referral to [place] area. CSW discussed when bed offers come in that CSW will provide Medicare SNF ratings list . Patient has received the COVID vaccines. No further questions reported at this time. CSW to continue to follow and assist with discharge planning needs.  Expected Discharge Plan: Skilled Nursing Facility Barriers to Discharge: Continued Medical Work up   Patient Goals and CMS Choice Patient states their goals for this hospitalization and ongoing recovery are:: to go to SNF CMS Medicare.gov Compare Post Acute Care list provided to:: Patient Choice offered to / list presented to : Patient  Expected Discharge Plan and Services Expected Discharge Plan: [place]                                              Prior Living Arrangements/Services   Lives with:: Self Patient language and need for interpreter reviewed:: Yes Do you feel safe going back to the place where you live?: No   SNF  Need for Family Participation in Patient Care: Yes (Comment) Care giver support system in place?: Yes (comment)   Criminal Activity/Legal Involvement Pertinent to Current Situation/Hospitalization: No - Comment as needed  Activities of Daily Living      Permission Sought/Granted Permission sought to share information with :  Case Manager, Customer service manager, Family Supports Permission granted to share information with : Yes, Verbal Permission Granted  Share Information with NAME: Elta Guadeloupe  Permission granted to share info w AGENCY: SNF  Permission granted to share info w Relationship: son  Permission granted to share info w Contact Information: Elta Guadeloupe [phone]  Emotional Assessment Appearance:: Appears stated age Attitude/Demeanor/Rapport: Gracious Affect (typically observed): Calm Orientation: : Oriented to Self, Oriented to Place, Oriented to  Time, Oriented to Situation Alcohol / Substance Use: Not Applicable Psych Involvement: No (comment)  Admission diagnosis:  Dehydration [E86.0] Abdominal distension [R14.0] Renal cyst [N28.1] Pain [R52] Calculus of gallbladder without cholecystitis without obstruction [K80.20] Acute encephalopathy [G93.40] AKI (acute kidney injury) ([place]) [N17.9] Acute kidney injury superimposed on CKD ([place]) [N17.9, N18.9] Anemia, unspecified type [D64.9] Patient Active Problem List   Diagnosis Date Noted  . Acute encephalopathy 04/26/2020  . Acute kidney injury superimposed on CKD ([place]) 04/25/2020  . History of CVA (cerebrovascular accident) 04/25/2020  . Depression with anxiety 04/25/2020  . 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 Left thyroid nodule 04/25/2020  . Right renal mass 04/25/2020  . 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 Labile blood pressure   . Elevated blood pressure reading   . CKD (chronic kidney disease), stage III ([place])   . Hyperlipidemia associated with type 2 diabetes mellitus ([place])   . Small vessel disease ([place]) 03/28/2018  .  Type 2 diabetes mellitus ([place])   . Anxiety state   . Stage 3 chronic kidney disease ([place])   . Hyperlipidemia   . History of breast cancer   . Acute ischemic stroke ([place])   . TIA (transient ischemic attack) 03/24/2018  . Dyspnea on exertion 05/30/2017  . Sinus tachycardia 05/30/2017  . Mixed  hyperlipidemia 05/30/2017  . Hypertension associated with diabetes ([place]) 05/30/2017   PCP:  Crist Infante, MD Pharmacy:   [id number] - [place], [place]. [address]. [place] [address] Phone: [phone] Fax: [phone]     Social Determinants of Health (SDOH) Interventions    Readmission Risk Interventions No flowsheet data found.

## 2020-05-02 NOTE — TOC Progression Note (Signed)
Transition of Care [facility]) - Progression Note    Patient Details  Name: Shere Eisenhart MRN: [id number] Date of Birth: 1941/02/15  [facility] CM/SW [place], [place] Phone Number: 05/02/2020, 4:30 PM  Clinical Narrative:     CSW started insurance authorization for patient. Lexicographer number is # [id number]. Insurance authorization start date is for 10/9. CSW faxed over clinicals for review.  Pending SNF choice. Pending insurance authorization.  CSW will continue to follow.   Expected Discharge Plan: Skilled Nursing Facility Barriers to Discharge: Continued Medical Work up  Expected Discharge Plan and Services Expected Discharge Plan: [place]                                               Social Determinants of Health (SDOH) Interventions    Readmission Risk Interventions No flowsheet data found.

## 2020-05-02 NOTE — Progress Notes (Signed)
Inpatient Rehab Admissions Coordinator:   I have received a denial for CIR from insurance.  I let pt and her son, Dr. Lynann Bologna, know.  They are open for SNF for rehab to maximize independence prior to returning home. TOC team aware.  Shann Medal, PT, DPT Admissions Coordinator [phone] 05/02/20  12:26 PM

## 2020-05-02 NOTE — Progress Notes (Signed)
Patient ID: [name], female   DOB: 1940-11-08, 79 y.o.   MRN: [id number]  PROGRESS NOTE    Larita Deremer  FUX:[id number] DOB: Aug 20, 1940 DOA: 04/25/2020 PCP: Crist Infante, MD   Brief Narrative:  79 y.o.femalewith medical history significant fortype 2 diabetes, HTN, HLD, CKD stage IV, history of unspecified CVA, and depression/anxiety was admitted with acute encephalopathy, SIRS, acute kidney injury on chronic kidney disease stage IV secondary to combined volume depletion and possible ATN, cholelithiasis with pericholecystic fluid.  She was also started on broad-spectrum antibiotics.  On presentation, creatinine was 5.3, baseline creatinine of 1.97.  CT of the head was negative for acute abnormality.  CT chest revealed 4 cm ascending aortic aneurysm and 2.4 cm left thyroid nodule.  Right upper quadrant ultrasound showed cholelithiasis with pericholecystic fluid but no evidence of cholecystitis.  Nephrology was consulted.  Currently, cardiology was consulted for PVCs/?  Nonsustained VT's.  PT recommended CIR placement.  Assessment & Plan:   Acute metabolic encephalopathy ?  SIRS -Patient presented with acute encephalopathy with concern for some kind of infection.  She was started on broad-spectrum antibiotics.  COVID-19 testing on presentation was negative.  Cultures have been negative so far. -Subsequently, mental status is much improved.  Still slow to respond. -No evidence of infection.  Antibiotics discontinued on 04/28/2020 after discussion with patient's son -PT is recommending CIR placement.  CIR following.  AKI on CKD stage IV Acute metabolic acidosis -Prior creatinine was 1.97 2 years ago.  Creatinine 5.52 on presentation. -Likely from combination of hypovolemia and ATN -Nephrology following.  IV fluids discontinued on 04/29/2020 by nephrology.  Acidosis resolved.  Currently on sodium bicarbonate tablets.  Will DC sodium bicarb tablets. -Creatinine 2.55 today. -Lasix and  lisinopril held.  Cholelithiasis -Cholelithiasis with mild amount of pericholecystic fluid on right upper quadrant ultrasound without evidence of acute cholecystitis. -Currently does not have any abdominal pain.  Only mildly elevated LFTs on presentation.  Will repeat LFTs in a.m. -No need for HIDA scan at this time.  Frequent PVCs/nonsustained V. Tach -Cardiology has signed off.  Recommend potassium and magnesium to be above 4 and 2 respectively.  No further work-up recommended.  Echo showed EF of 60 to 65%.  Diabetes mellitus type 2 -Uncontrolled with hyperglycemia.  Continue CBGs with SSI  Hypertension -Monitor blood pressure.  Lisinopril and Lasix on hold  History of unspecified CVA -Continue Plavix and statin  Hyperlipidemia -Continue statin  Anemia of chronic disease -Hemoglobin stable, 7.8 today.  Transfuse if hemoglobin is less than 7.  Left thyroid nodule -2.4 cm thyroid nodule seen on CT imaging.  Consider follow-up thyroid ultrasound as an outpatient.  Ascending aortic aneurysm -4 cm ascending aortic aneurysm seen on CT.  Annual follow-up by CTA or MRA recommended.  Outpatient follow-up.  Recommend outpatient CT surgery evaluation  Complex right renal mass -Seen on abdominal ultrasound. -May be renal cyst seen on prior renal ultrasound however correlation with contrast-enhanced CT abdomen/pelviswasrecommended. Cannot obtain contrast imagingat this time due to AKI. Will need referral to urology when patient is stabilized.  Depression/anxiety -Continue Abilify and Remeron along with as needed Ativan.  Effexor was resumed on 04/30/2020 as well.  Generalized deconditioning/weakness -PT/OT recommend CIR placement.  CIR consulted.  PT is still recommending CIR placement.  Communicated with son/Dr. Phylliss Bob on 04/30/2020 who is agreeable to proceed with CIR placement.  DVT prophylaxis: Subcutaneous heparin Code Status: Full Family Communication: Communicated  with son/Dr. Phylliss Bob on 05/02/2020. Disposition Plan: Status is:  Inpatient  Remains inpatient appropriate because:Inpatient level of care appropriate due to severity of illness.  CIR once bed is available   Dispo: The patient is from: Home              Anticipated d/c is to: CIR               Anticipated d/c date is: 1 day              Patient currently is medically stable to d/c.   Consultants: Nephrology/cardiology  Procedures: Echo as above  Antimicrobials:  Anti-infectives (From admission, onward)   Start     Dose/Rate Route Frequency Ordered Stop   04/26/20 2000  piperacillin-tazobactam (ZOSYN) IVPB 2.25 g  Status:  Discontinued        2.25 g 100 mL/hr over 30 Minutes Intravenous Every 8 hours 04/26/20 1116 04/28/20 2032   04/26/20 1030  piperacillin-tazobactam (ZOSYN) IVPB 3.375 g        3.375 g 100 mL/hr over 30 Minutes Intravenous  Once 04/26/20 1016 04/26/20 1239       Subjective: Patient seen and examined at bedside.  Denies overnight fever, nausea or vomiting.  Objective: Vitals:   05/01/20 1703 05/01/20 1932 05/02/20 0437 05/02/20 0921  BP: (!) 148/76 (!) 143/74 132/85 (!) 156/86  Pulse: 67 72 75 (!) 106  Resp: 16 18 16 16   Temp: 99.2 F (37.3 C) 98.7 F (37.1 C) 97.8 F (36.6 C) 98.9 F (37.2 C)  TempSrc: Oral Oral Oral Oral  SpO2: 97% 97% 97% 98%  Weight:   98.8 kg     Intake/Output Summary (Last 24 hours) at 05/02/2020 1009 Last data filed at 05/01/2020 2200 Gross per 24 hour  Intake 480 ml  Output 250 ml  Net 230 ml   Filed Weights   04/30/20 0441 05/01/20 0500 05/02/20 0437  Weight: 88.7 kg 99.3 kg 98.8 kg    Examination:  General exam: No acute distress.  Elderly female sitting on chair. Respiratory system: Bilateral decreased breath sounds at bases with no wheezing cardiovascular system: Rate controlled, S1-S2 heard  gastrointestinal system: Abdomen is nondistended, soft and nontender.  Bowel sounds are heard  extremities: Mild  lower extremity edema present.  No clubbing.   Data Reviewed: I have personally reviewed following labs and imaging studies  CBC: Recent Labs  Lab 04/28/20 0808 04/29/20 0324 04/30/20 0338 05/01/20 0445 05/02/20 0113  WBC 10.9* 9.2 8.7 8.5 8.6  NEUTROABS 8.9* 6.3 5.8 6.1 6.1  HGB 9.3* 7.9* 8.2* 7.8* 7.9*  HCT 28.6* 24.2* 24.8* 24.6* 25.4*  MCV 95.7 95.7 98.8 100.0 99.6  PLT 330 311 346 334 098   Basic Metabolic Panel: Recent Labs  Lab 04/27/20 0230 04/27/20 0230 04/27/20 1026 04/27/20 1026 04/28/20 0316 04/28/20 0316 04/28/20 0808 04/29/20 0324 04/30/20 0338 05/01/20 0445 05/02/20 0113  NA 147*   < > 137   < > 142   < > 139 142 143 141 141  K 3.2*   < > 3.0*   < > 3.3*   < > 3.7 3.6 4.4 3.8 3.8  CL 99   < > 103   < > 108   < > 105 107 109 107 106  CO2 13*   < > 16*   < > 19*   < > 18* 20* 20* 23 24  GLUCOSE 109*   < > 195*   < > 167*   < > 176* 166* 149* 130* 111*  BUN 90*   < >  92*   < > 82*   < > 81* 71* 62* 49* 46*  CREATININE 4.40*   < > 4.28*   < > 3.80*   < > 3.91* 3.30* 2.83* 2.58* 2.55*  CALCIUM 6.8*   < > 8.0*   < > 8.5*   < > 9.0 8.8* 8.7* 8.7* 8.6*  MG 1.7   < > 2.0   < > 1.8   < > 2.1 1.8 1.7 1.8 1.7  PHOS 4.4  --  4.2  --  3.6  --  3.5 3.5  --   --   --    < > = values in this interval not displayed.   GFR: CrCl cannot be calculated (Unknown ideal weight.). Liver Function Tests: Recent Labs  Lab 04/25/20 1639 04/25/20 1639 04/26/20 0500 04/26/20 0500 04/27/20 0230 04/27/20 1026 04/28/20 0316 04/28/20 0808 04/29/20 0324  AST 65*  --  76*  --   --   --   --   --   --   ALT 49*  --  53*  --   --   --   --   --   --   ALKPHOS 123  --  132*  --   --   --   --   --   --   BILITOT 1.3*  --  1.1  --   --   --   --   --   --   PROT 6.7  --  6.4*  --   --   --   --   --   --   ALBUMIN 2.7*   < > 2.5*   < > 2.0* 2.0* 1.9* 2.1* 1.9*   < > = values in this interval not displayed.   Recent Labs  Lab 04/26/20 0500  LIPASE 53*   No results for  input(s): AMMONIA in the last 168 hours. Coagulation Profile: No results for input(s): INR, PROTIME in the last 168 hours. Cardiac Enzymes: No results for input(s): CKTOTAL, CKMB, CKMBINDEX, TROPONINI in the last 168 hours. BNP (last 3 results) No results for input(s): PROBNP in the last 8760 hours. HbA1C: No results for input(s): HGBA1C in the last 72 hours. CBG: Recent Labs  Lab 05/01/20 0812 05/01/20 1142 05/01/20 1706 05/01/20 2038 05/02/20 0814  GLUCAP 186* 142* 115* 101* 90   Lipid Profile: No results for input(s): CHOL, HDL, LDLCALC, TRIG, CHOLHDL, LDLDIRECT in the last 72 hours. Thyroid Function Tests: No results for input(s): TSH, T4TOTAL, FREET4, T3FREE, THYROIDAB in the last 72 hours. Anemia Panel: No results for input(s): VITAMINB12, FOLATE, FERRITIN, TIBC, IRON, RETICCTPCT in the last 72 hours. Sepsis Labs: Recent Labs  Lab 04/25/20 1800  LATICACIDVEN 1.2    Recent Results (from the past 240 hour(s))  Culture, blood (routine x 2)     Status: None   Collection Time: 04/25/20  5:07 PM   Specimen: BLOOD  Result Value Ref Range Status   Specimen Description BLOOD LEFT ANTECUBITAL  Final   Special Requests   Final    BOTTLES DRAWN AEROBIC AND ANAEROBIC Blood Culture adequate volume   Culture   Final    NO GROWTH 5 DAYS Performed at [facility], [address], [place], [place] [address]    Report Status 04/30/2020 FINAL  Final  Respiratory Panel by RT PCR (Flu A&B, Covid) - Nasopharyngeal Swab     Status: None   Collection Time: 04/25/20  7:28 PM   Specimen:  Nasopharyngeal Swab  Result Value Ref Range Status   SARS Coronavirus 2 by RT PCR NEGATIVE NEGATIVE Final    Comment: (NOTE) SARS-CoV-2 target nucleic acids are NOT DETECTED.  The SARS-CoV-2 RNA is generally detectable in upper respiratoy specimens during the acute phase of infection. The lowest concentration of SARS-CoV-2 viral copies this assay can detect is 131 copies/mL. A negative result  does not preclude SARS-Cov-2 infection and should not be used as the sole basis for treatment or other patient management decisions. A negative result may occur with  improper specimen collection/handling, submission of specimen other than nasopharyngeal swab, presence of viral mutation(s) within the areas targeted by this assay, and inadequate number of viral copies (<131 copies/mL). A negative result must be combined with clinical observations, patient history, and epidemiological information. The expected result is Negative.  Fact Sheet for Patients:  PinkCheek.be  Fact Sheet for Healthcare Providers:  GravelBags.it  This test is no t yet approved or cleared by the [place] FDA and  has been authorized for detection and/or diagnosis of SARS-CoV-2 by FDA under an Emergency Use Authorization (EUA). This EUA will remain  in effect (meaning this test can be used) for the duration of the COVID-19 declaration under Section 564(b)(1) of the Act, 21 U.S.C. section 360bbb-3(b)(1), unless the authorization is terminated or revoked sooner.     Influenza A by PCR NEGATIVE NEGATIVE Final   Influenza B by PCR NEGATIVE NEGATIVE Final    Comment: (NOTE) The Xpert Xpress SARS-CoV-2/FLU/RSV assay is intended as an aid in  the diagnosis of influenza from Nasopharyngeal swab specimens and  should not be used as a sole basis for treatment. Nasal washings and  aspirates are unacceptable for Xpert Xpress SARS-CoV-2/FLU/RSV  testing.  Fact Sheet for Patients: PinkCheek.be  Fact Sheet for Healthcare Providers: GravelBags.it  This test is not yet approved or cleared by the [place] FDA and  has been authorized for detection and/or diagnosis of SARS-CoV-2 by  FDA under an Emergency Use Authorization (EUA). This EUA will remain  in effect (meaning this test can be used) for  the duration of the  Covid-19 declaration under Section 564(b)(1) of the Act, 21  U.S.C. section 360bbb-3(b)(1), unless the authorization is  terminated or revoked. Performed at [facility], [place] [address], [place], [place] [address]   Culture, blood (routine x 2)     Status: None   Collection Time: 04/25/20  7:29 PM   Specimen: BLOOD  Result Value Ref Range Status   Specimen Description BLOOD SITE NOT SPECIFIED  Final   Special Requests   Final    BOTTLES DRAWN AEROBIC AND ANAEROBIC Blood Culture results may not be optimal due to an inadequate volume of blood received in culture bottles   Culture   Final    NO GROWTH 5 DAYS Performed at [facility], [place] [address], [place], [place] [address]    Report Status 04/30/2020 FINAL  Final  Urine culture     Status: None   Collection Time: 04/25/20 10:00 PM   Specimen: Urine, Random  Result Value Ref Range Status   Specimen Description URINE, RANDOM  Final   Special Requests NONE  Final   Culture   Final    NO GROWTH Performed at [facility], [place] [address], [place], [place] [address]    Report Status 04/27/2020 FINAL  Final         Radiology Studies: No results found.  Scheduled Meds: . sodium chloride   Intravenous Once  . acetaminophen  650 mg Oral Once  . ARIPiprazole  5 mg Oral Daily  . atorvastatin  80 mg Oral q1800  . calcium carbonate  1,000 mg of elemental calcium Oral QHS  . clopidogrel  75 mg Oral Daily  . heparin  5,000 Units Subcutaneous Q8H  . insulin aspart  0-9 Units Subcutaneous TID WC  . magnesium oxide  400 mg Oral BID  . mirtazapine  15 mg Oral QHS  . sodium bicarbonate  1,300 mg Oral TID  . venlafaxine XR  150 mg Oral Daily   Continuous Infusions:         Aline August, MD [facility] 05/02/2020, 10:09 AM

## 2020-05-03 DIAGNOSIS — G9340 Encephalopathy, unspecified: Secondary | ICD-10-CM | POA: Diagnosis not present

## 2020-05-03 DIAGNOSIS — F418 Other specified anxiety disorders: Secondary | ICD-10-CM | POA: Diagnosis not present

## 2020-05-03 DIAGNOSIS — N179 Acute kidney failure, unspecified: Secondary | ICD-10-CM | POA: Diagnosis not present

## 2020-05-03 DIAGNOSIS — I77810 Thoracic aortic ectasia: Secondary | ICD-10-CM | POA: Diagnosis not present

## 2020-05-03 LAB — GLUCOSE, CAPILLARY
Glucose-Capillary: 153 mg/dL — ABNORMAL HIGH (ref 70–99)
Glucose-Capillary: 137 mg/dL — ABNORMAL HIGH (ref 70–99)
Glucose-Capillary: 126 mg/dL — ABNORMAL HIGH (ref 70–99)
Glucose-Capillary: 81 mg/dL (ref 70–99)

## 2020-05-03 NOTE — Progress Notes (Signed)
Patient ID: [name], female   DOB: [birth year], 79 y.o.   MRN: [id number]  PROGRESS NOTE    Marcie Shearon  RCB:[id number] DOB: Dec 18, 1940 DOA: 04/25/2020 PCP: Crist Infante, MD   Brief Narrative:  79 y.o.femalewith medical history significant fortype 2 diabetes, HTN, HLD, CKD stage IV, history of unspecified CVA, and depression/anxiety was admitted with acute encephalopathy, SIRS, acute kidney injury on chronic kidney disease stage IV secondary to combined volume depletion and possible ATN, cholelithiasis with pericholecystic fluid.  She was also started on broad-spectrum antibiotics.  On presentation, creatinine was 5.3, baseline creatinine of 1.97.  CT of the head was negative for acute abnormality.  CT chest revealed 4 cm ascending aortic aneurysm and 2.4 cm left thyroid nodule.  Right upper quadrant ultrasound showed cholelithiasis with pericholecystic fluid but no evidence of cholecystitis.  Nephrology was consulted.  Currently, cardiology was consulted for PVCs/?  Nonsustained VT's.  PT recommended CIR placement.  CIR placement was denied by insurance.  Now looking for SNF placement.  Assessment & Plan:   Acute metabolic encephalopathy ?  SIRS -Patient presented with acute encephalopathy with concern for some kind of infection.  She was started on broad-spectrum antibiotics.  COVID-19 testing on presentation was negative.  Cultures have been negative so far. -Subsequently, mental status is much improved.  Still slow to respond. -No evidence of infection.  Antibiotics discontinued on 04/28/2020 after discussion with patient's son -CIR placement was denied by insurance.  Now looking for SNF placement.  Social worker following.  AKI on CKD stage IV Acute metabolic acidosis -Prior creatinine was 1.97 2 years ago.  Creatinine 5.52 on presentation. -Likely from combination of hypovolemia and ATN -Nephrology following.  IV fluids discontinued on 04/29/2020 by nephrology.  Acidosis resolved.   Currently on sodium bicarbonate tablets.  Will DC sodium bicarb tablets. -Creatinine 2.55 on 05/02/2020. -Lasix and lisinopril held.  Cholelithiasis -Cholelithiasis with mild amount of pericholecystic fluid on right upper quadrant ultrasound without evidence of acute cholecystitis. -Currently does not have any abdominal pain.  Only mildly elevated LFTs on presentation.  Will repeat LFTs in a.m. -No need for HIDA scan at this time.  Frequent PVCs/nonsustained V. Tach -Cardiology has signed off.  Recommend potassium and magnesium to be above 4 and 2 respectively.  No further work-up recommended.  Echo showed EF of 60 to 65%.  Diabetes mellitus type 2 -Uncontrolled with hyperglycemia.  Continue CBGs with SSI  Hypertension -Monitor blood pressure.  Lisinopril and Lasix on hold  History of unspecified CVA -Continue Plavix and statin  Hyperlipidemia -Continue statin  Anemia of chronic disease -Hemoglobin stable, 7.8 today.  Transfuse if hemoglobin is less than 7.  Left thyroid nodule -2.4 cm thyroid nodule seen on CT imaging.  Consider follow-up thyroid ultrasound as an outpatient.  Ascending aortic aneurysm -4 cm ascending aortic aneurysm seen on CT.  Annual follow-up by CTA or MRA recommended.  Outpatient follow-up.  Recommend outpatient CT surgery evaluation  Complex right renal mass -Seen on abdominal ultrasound. -May be renal cyst seen on prior renal ultrasound however correlation with contrast-enhanced CT abdomen/pelviswasrecommended. Cannot obtain contrast imagingat this time due to AKI. Will need referral to urology when patient is stabilized.  Depression/anxiety -Continue Abilify and Remeron along with as needed Ativan.  Effexor was resumed on 04/30/2020 as well.  Generalized deconditioning/weakness -Education officer, museum consult for SNF placement.  DVT prophylaxis: Subcutaneous heparin Code Status: Full Family Communication: Communicated with son/Dr. Phylliss Bob on  05/02/2020. Disposition Plan: Status is: Inpatient  Remains inpatient appropriate because:Inpatient level of care appropriate due to severity of illness.  SNF once bed is available   Dispo: The patient is from: Home              Anticipated d/c is to: SNF               Anticipated d/c date is: 1 day              Patient currently is medically stable to d/c.   Consultants: Nephrology/cardiology  Procedures: Echo as above  Antimicrobials:  Anti-infectives (From admission, onward)   Start     Dose/Rate Route Frequency Ordered Stop   04/26/20 2000  piperacillin-tazobactam (ZOSYN) IVPB 2.25 g  Status:  Discontinued        2.25 g 100 mL/hr over 30 Minutes Intravenous Every 8 hours 04/26/20 1116 04/28/20 2032   04/26/20 1030  piperacillin-tazobactam (ZOSYN) IVPB 3.375 g        3.375 g 100 mL/hr over 30 Minutes Intravenous  Once 04/26/20 1016 04/26/20 1239       Subjective: Patient seen and examined at bedside.  Sleepy, wakes up slightly.  No worsening shortness of breath, fever, abdominal pain or diarrhea reported  objective: Vitals:   05/02/20 1146 05/02/20 1658 05/02/20 2012 05/03/20 0422  BP: (!) 152/77 (!) 150/76 (!) 145/78 140/78  Pulse: 96 89 88 90  Resp: 16 18 16 18   Temp: 99 F (37.2 C) 98.8 F (37.1 C) 97.8 F (36.6 C) 98.5 F (36.9 C)  TempSrc: Oral Oral Oral Oral  SpO2: 95% 96% 97% 97%  Weight:    98.9 kg    Intake/Output Summary (Last 24 hours) at 05/03/2020 0855 Last data filed at 05/02/2020 2016 Gross per 24 hour  Intake 360 ml  Output --  Net 360 ml   Filed Weights   05/01/20 0500 05/02/20 0437 05/03/20 0422  Weight: 99.3 kg 98.8 kg 98.9 kg    Examination:  General exam: No distress.  Elderly female sitting on chair.  Sleepy, wakes up slightly. Respiratory system: Bilateral decreased breath sounds at bases with scattered crackles  cardiovascular system: S1-S2 heard, rate controlled gastrointestinal system: Abdomen is nondistended, soft and  nontender.  Normal bowel sounds heard  extremities: No cyanosis.  Trace lower extremity edema present.   Data Reviewed: I have personally reviewed following labs and imaging studies  CBC: Recent Labs  Lab 04/28/20 0808 04/29/20 0324 04/30/20 0338 05/01/20 0445 05/02/20 0113  WBC 10.9* 9.2 8.7 8.5 8.6  NEUTROABS 8.9* 6.3 5.8 6.1 6.1  HGB 9.3* 7.9* 8.2* 7.8* 7.9*  HCT 28.6* 24.2* 24.8* 24.6* 25.4*  MCV 95.7 95.7 98.8 100.0 99.6  PLT 330 311 346 334 326   Basic Metabolic Panel: Recent Labs  Lab 04/27/20 0230 04/27/20 0230 04/27/20 1026 04/27/20 1026 04/28/20 0316 04/28/20 0316 04/28/20 0808 04/29/20 0324 04/30/20 0338 05/01/20 0445 05/02/20 0113  NA 147*   < > 137   < > 142   < > 139 142 143 141 141  K 3.2*   < > 3.0*   < > 3.3*   < > 3.7 3.6 4.4 3.8 3.8  CL 99   < > 103   < > 108   < > 105 107 109 107 106  CO2 13*   < > 16*   < > 19*   < > 18* 20* 20* 23 24  GLUCOSE 109*   < > 195*   < > 167*   < >  176* 166* 149* 130* 111*  BUN 90*   < > 92*   < > 82*   < > 81* 71* 62* 49* 46*  CREATININE 4.40*   < > 4.28*   < > 3.80*   < > 3.91* 3.30* 2.83* 2.58* 2.55*  CALCIUM 6.8*   < > 8.0*   < > 8.5*   < > 9.0 8.8* 8.7* 8.7* 8.6*  MG 1.7   < > 2.0   < > 1.8   < > 2.1 1.8 1.7 1.8 1.7  PHOS 4.4  --  4.2  --  3.6  --  3.5 3.5  --   --   --    < > = values in this interval not displayed.   GFR: CrCl cannot be calculated (Unknown ideal weight.). Liver Function Tests: Recent Labs  Lab 04/27/20 0230 04/27/20 1026 04/28/20 0316 04/28/20 0808 04/29/20 0324  ALBUMIN 2.0* 2.0* 1.9* 2.1* 1.9*   No results for input(s): LIPASE, AMYLASE in the last 168 hours. No results for input(s): AMMONIA in the last 168 hours. Coagulation Profile: No results for input(s): INR, PROTIME in the last 168 hours. Cardiac Enzymes: No results for input(s): CKTOTAL, CKMB, CKMBINDEX, TROPONINI in the last 168 hours. BNP (last 3 results) No results for input(s): PROBNP in the last 8760  hours. HbA1C: No results for input(s): HGBA1C in the last 72 hours. CBG: Recent Labs  Lab 05/02/20 0814 05/02/20 1119 05/02/20 1558 05/02/20 2055 05/03/20 0830  GLUCAP 90 160* 109* 104* 153*   Lipid Profile: No results for input(s): CHOL, HDL, LDLCALC, TRIG, CHOLHDL, LDLDIRECT in the last 72 hours. Thyroid Function Tests: No results for input(s): TSH, T4TOTAL, FREET4, T3FREE, THYROIDAB in the last 72 hours. Anemia Panel: No results for input(s): VITAMINB12, FOLATE, FERRITIN, TIBC, IRON, RETICCTPCT in the last 72 hours. Sepsis Labs: No results for input(s): PROCALCITON, LATICACIDVEN in the last 168 hours.  Recent Results (from the past 240 hour(s))  Culture, blood (routine x 2)     Status: None   Collection Time: 04/25/20  5:07 PM   Specimen: BLOOD  Result Value Ref Range Status   Specimen Description BLOOD LEFT ANTECUBITAL  Final   Special Requests   Final    BOTTLES DRAWN AEROBIC AND ANAEROBIC Blood Culture adequate volume   Culture   Final    NO GROWTH 5 DAYS Performed at [facility], [address] [place], [place] [address]    Report Status 04/30/2020 FINAL  Final  Respiratory Panel by RT PCR (Flu A&B, Covid) - Nasopharyngeal Swab     Status: None   Collection Time: 04/25/20  7:28 PM   Specimen: Nasopharyngeal Swab  Result Value Ref Range Status   SARS Coronavirus 2 by RT PCR NEGATIVE NEGATIVE Final    Comment: (NOTE) SARS-CoV-2 target nucleic acids are NOT DETECTED.  The SARS-CoV-2 RNA is generally detectable in upper respiratoy specimens during the acute phase of infection. The lowest concentration of SARS-CoV-2 viral copies this assay can detect is 131 copies/mL. A negative result does not preclude SARS-Cov-2 infection and should not be used as the sole basis for treatment or other patient management decisions. A negative result may occur with  improper specimen collection/handling, submission of specimen other than nasopharyngeal swab, presence of  viral mutation(s) within the areas targeted by this assay, and inadequate number of viral copies (<131 copies/mL). A negative result must be combined with clinical observations, patient history, and epidemiological information. The expected result is Negative.  Fact Sheet for Patients:  PinkCheek.be  Fact Sheet for Healthcare Providers:  GravelBags.it  This test is no t yet approved or cleared by the [place] FDA and  has been authorized for detection and/or diagnosis of SARS-CoV-2 by FDA under an Emergency Use Authorization (EUA). This EUA will remain  in effect (meaning this test can be used) for the duration of the COVID-19 declaration under Section 564(b)(1) of the Act, 21 U.S.C. section 360bbb-3(b)(1), unless the authorization is terminated or revoked sooner.     Influenza A by PCR NEGATIVE NEGATIVE Final   Influenza B by PCR NEGATIVE NEGATIVE Final    Comment: (NOTE) The Xpert Xpress SARS-CoV-2/FLU/RSV assay is intended as an aid in  the diagnosis of influenza from Nasopharyngeal swab specimens and  should not be used as a sole basis for treatment. Nasal washings and  aspirates are unacceptable for Xpert Xpress SARS-CoV-2/FLU/RSV  testing.  Fact Sheet for Patients: PinkCheek.be  Fact Sheet for Healthcare Providers: GravelBags.it  This test is not yet approved or cleared by the [place] FDA and  has been authorized for detection and/or diagnosis of SARS-CoV-2 by  FDA under an Emergency Use Authorization (EUA). This EUA will remain  in effect (meaning this test can be used) for the duration of the  Covid-19 declaration under Section 564(b)(1) of the Act, 21  U.S.C. section 360bbb-3(b)(1), unless the authorization is  terminated or revoked. Performed at [facility], [place] [address] [place], [place] [address]   Culture, blood (routine x 2)      Status: None   Collection Time: 04/25/20  7:29 PM   Specimen: BLOOD  Result Value Ref Range Status   Specimen Description BLOOD SITE NOT SPECIFIED  Final   Special Requests   Final    BOTTLES DRAWN AEROBIC AND ANAEROBIC Blood Culture results may not be optimal due to an inadequate volume of blood received in culture bottles   Culture   Final    NO GROWTH 5 DAYS Performed at [facility], [place] [address], [place], [place] [address]    Report Status 04/30/2020 FINAL  Final  Urine culture     Status: None   Collection Time: 04/25/20 10:00 PM   Specimen: Urine, Random  Result Value Ref Range Status   Specimen Description URINE, RANDOM  Final   Special Requests NONE  Final   Culture   Final    NO GROWTH Performed at [facility], [place] [address] [place],  [address]    Report Status 04/27/2020 FINAL  Final         Radiology Studies: No results found.      Scheduled Meds:  sodium chloride   Intravenous Once   acetaminophen  650 mg Oral Once   ARIPiprazole  5 mg Oral Daily   atorvastatin  80 mg Oral q1800   calcium carbonate  1,000 mg of elemental calcium Oral QHS   clopidogrel  75 mg Oral Daily   heparin  5,000 Units Subcutaneous Q8H   insulin aspart  0-9 Units Subcutaneous TID WC   magnesium oxide  400 mg Oral BID   mirtazapine  15 mg Oral QHS   venlafaxine XR  150 mg Oral Daily   Continuous Infusions:         Aline August, MD [facility] 05/03/2020, 8:55 AM

## 2020-05-03 NOTE — Social Work (Signed)
Please be advised that the above-named patient will require a short-term nursing home stay-anticipated 30 days or less for rehabilitation and strengthening. The plan is for return home.  

## 2020-05-04 DIAGNOSIS — G9340 Encephalopathy, unspecified: Secondary | ICD-10-CM | POA: Diagnosis not present

## 2020-05-04 DIAGNOSIS — F418 Other specified anxiety disorders: Secondary | ICD-10-CM | POA: Diagnosis not present

## 2020-05-04 DIAGNOSIS — N179 Acute kidney failure, unspecified: Secondary | ICD-10-CM | POA: Diagnosis not present

## 2020-05-04 DIAGNOSIS — I77810 Thoracic aortic ectasia: Secondary | ICD-10-CM | POA: Diagnosis not present

## 2020-05-04 LAB — BASIC METABOLIC PANEL
Anion gap: 10 (ref 5–15)
BUN: 34 mg/dL — ABNORMAL HIGH (ref 8–23)
CO2: 26 mmol/L (ref 22–32)
Calcium: 8.7 mg/dL — ABNORMAL LOW (ref 8.9–10.3)
Chloride: 106 mmol/L (ref 98–111)
Creatinine, Ser: 2.28 mg/dL — ABNORMAL HIGH (ref 0.44–1.00)
GFR, Estimated: 20 mL/min — ABNORMAL LOW (ref >60)
Glucose, Bld: 105 mg/dL — ABNORMAL HIGH (ref 70–99)
Potassium: 4.1 mmol/L (ref 3.5–5.1)
Sodium: 142 mmol/L (ref 135–145)

## 2020-05-04 LAB — CBC WITH DIFFERENTIAL/PLATELET
Abs Immature Granulocytes: 0.14 10*3/uL — ABNORMAL HIGH (ref 0.00–0.07)
Basophils Absolute: 0 10*3/uL (ref 0.0–0.1)
Basophils Relative: 0 %
Eosinophils Absolute: 0.2 10*3/uL (ref 0.0–0.5)
Eosinophils Relative: 2 %
HCT: 25.5 % — ABNORMAL LOW (ref 36.0–46.0)
Hemoglobin: 7.9 g/dL — ABNORMAL LOW (ref 12.0–15.0)
Immature Granulocytes: 2 %
Lymphocytes Relative: 18 %
Lymphs Abs: 1.3 10*3/uL (ref 0.7–4.0)
MCH: 31.1 pg (ref 26.0–34.0)
MCHC: 31 g/dL (ref 30.0–36.0)
MCV: 100.4 fL — ABNORMAL HIGH (ref 80.0–100.0)
Monocytes Absolute: 0.7 10*3/uL (ref 0.1–1.0)
Monocytes Relative: 9 %
Neutro Abs: 5.1 10*3/uL (ref 1.7–7.7)
Neutrophils Relative %: 69 %
Platelets: 332 10*3/uL (ref 150–400)
RBC: 2.54 MIL/uL — ABNORMAL LOW (ref 3.87–5.11)
RDW: 13.6 % (ref 11.5–15.5)
WBC: 7.4 10*3/uL (ref 4.0–10.5)
nRBC: 0 % (ref 0.0–0.2)

## 2020-05-04 LAB — GLUCOSE, CAPILLARY
Glucose-Capillary: 99 mg/dL (ref 70–99)
Glucose-Capillary: 172 mg/dL — ABNORMAL HIGH (ref 70–99)
Glucose-Capillary: 78 mg/dL (ref 70–99)
Glucose-Capillary: 91 mg/dL (ref 70–99)

## 2020-05-04 LAB — MAGNESIUM: Magnesium: 1.7 mg/dL (ref 1.7–2.4)

## 2020-05-04 NOTE — TOC Progression Note (Signed)
Transition of Care [facility]) - Progression Note    Patient Details  Name: [name] MRN: [id number] Date of Birth: January 22, 1941  Transition of Care [facility]) CM/SW [place], [place] Phone Number: 05/04/2020, 1:45 PM  Clinical Narrative:     Update 1:52pm -Patients son [name] called CSW back and decided to go with [facility] as SNF choice. Patients son said he called and discussed choice with patient and patient is in agreement with [facility] for SNF placement. CSW called Claiborne Billings with [facility] and she wants CSW to follow up with her first thing in the morning to see if she has a bed available tomorrow.  Pending bed offer. Insurance authorization pending. PASSR number pending.   CSW will continue to follow.  CSW spoke with patient at bedside and provided bed offers with medicare compate rating list. Patient wanted CSW to call pateints son [name] to help make SNF choice. CSW called patients son [name] and went over SNF bed offers. Patients son wants to look over offers and will call CSW back today with SNF choice.   Expected Discharge Plan: Skilled Nursing Facility Barriers to Discharge: Continued Medical Work up  Expected Discharge Plan and Services Expected Discharge Plan: [place]                                               Social Determinants of Health (SDOH) Interventions    Readmission Risk Interventions No flowsheet data found.

## 2020-05-04 NOTE — Progress Notes (Signed)
Patient ID: [name], female   DOB: 24-Sep-1940, 79 y.o.   MRN: [id number]  PROGRESS NOTE    Nazyia Gaugh  QMG:[id number] DOB: 05-27-1941 DOA: 04/25/2020 PCP: Crist Infante, MD   Brief Narrative:  79 y.o.femalewith medical history significant fortype 2 diabetes, HTN, HLD, CKD stage IV, history of unspecified CVA, and depression/anxiety was admitted with acute encephalopathy, SIRS, acute kidney injury on chronic kidney disease stage IV secondary to combined volume depletion and possible ATN, cholelithiasis with pericholecystic fluid.  She was also started on broad-spectrum antibiotics.  On presentation, creatinine was 5.3, baseline creatinine of 1.97.  CT of the head was negative for acute abnormality.  CT chest revealed 4 cm ascending aortic aneurysm and 2.4 cm left thyroid nodule.  Right upper quadrant ultrasound showed cholelithiasis with pericholecystic fluid but no evidence of cholecystitis.  Nephrology was consulted.  Currently, cardiology was consulted for PVCs/?  Nonsustained VT's.  PT recommended CIR placement.  CIR placement was denied by insurance.  Now looking for SNF placement.  Assessment & Plan:   Acute metabolic encephalopathy ?  SIRS -Patient presented with acute encephalopathy with concern for some kind of infection.  She was started on broad-spectrum antibiotics.  COVID-19 testing on presentation was negative.  Cultures have been negative so far. -Subsequently, mental status is much improved.  Still slow to respond. -No evidence of infection.  Antibiotics discontinued on 04/28/2020 after discussion with patient's son -CIR placement was denied by insurance.  Now looking for SNF placement.  Social worker following.  AKI on CKD stage IV Acute metabolic acidosis -Prior creatinine was 1.97 2 years ago.  Creatinine 5.52 on presentation. -Likely from combination of hypovolemia and ATN -Nephrology following.  IV fluids discontinued on 04/29/2020 by nephrology.  Acidosis resolved.   Off sodium bicarb tablets.. -Creatinine 2.28 today -Lasix and lisinopril held.  Cholelithiasis -Cholelithiasis with mild amount of pericholecystic fluid on right upper quadrant ultrasound without evidence of acute cholecystitis. -Currently does not have any abdominal pain.  Only mildly elevated LFTs on presentation.  Will repeat LFTs in a.m. -No need for HIDA scan at this time.  Frequent PVCs/nonsustained V. Tach -Cardiology has signed off.  Recommend potassium and magnesium to be above 4 and 2 respectively.  No further work-up recommended.  Echo showed EF of 60 to 65%.  Diabetes mellitus type 2 -Uncontrolled with hyperglycemia.  Continue CBGs with SSI  Hypertension -Monitor blood pressure.  Lisinopril and Lasix on hold  History of unspecified CVA -Continue Plavix and statin  Hyperlipidemia -Continue statin  Anemia of chronic disease -Hemoglobin stable, 7.9 today.  Transfuse if hemoglobin is less than 7.  Left thyroid nodule -2.4 cm thyroid nodule seen on CT imaging.  Consider follow-up thyroid ultrasound as an outpatient.  Ascending aortic aneurysm -4 cm ascending aortic aneurysm seen on CT.  Annual follow-up by CTA or MRA recommended.  Outpatient follow-up.  Recommend outpatient CT surgery evaluation  Complex right renal mass -Seen on abdominal ultrasound. -May be renal cyst seen on prior renal ultrasound however correlation with contrast-enhanced CT abdomen/pelviswasrecommended. Cannot obtain contrast imagingat this time due to AKI. Will need referral to urology when patient is stabilized.  Depression/anxiety -Continue Abilify and Remeron along with as needed Ativan.  Effexor was resumed on 04/30/2020 as well.  Generalized deconditioning/weakness -Education officer, museum consult for SNF placement.  DVT prophylaxis: Subcutaneous heparin Code Status: Full Family Communication: Communicated with son/Dr. Phylliss Bob on 05/02/2020. Disposition Plan: Status is:  Inpatient  Remains inpatient appropriate because:Inpatient level of care appropriate  due to severity of illness.  SNF once bed is available   Dispo: The patient is from: Home              Anticipated d/c is to: SNF               Anticipated d/c date is: 1 day              Patient currently is medically stable to d/c.   Consultants: Nephrology/cardiology  Procedures: Echo as above  Antimicrobials:  Anti-infectives (From admission, onward)   Start     Dose/Rate Route Frequency Ordered Stop   04/26/20 2000  piperacillin-tazobactam (ZOSYN) IVPB 2.25 g  Status:  Discontinued        2.25 g 100 mL/hr over 30 Minutes Intravenous Every 8 hours 04/26/20 1116 04/28/20 2032   04/26/20 1030  piperacillin-tazobactam (ZOSYN) IVPB 3.375 g        3.375 g 100 mL/hr over 30 Minutes Intravenous  Once 04/26/20 1016 04/26/20 1239       Subjective: Patient seen and examined at bedside.  Denies worsening shortness of breath, fever, chest pain or abdominal pain.   Objective: Vitals:   05/03/20 0422 05/03/20 1251 05/03/20 1955 05/04/20 0430  BP: 140/78 (!) 149/75 (!) 165/88 (!) 148/86  Pulse: 90 83 89 86  Resp: 18 18 20 18   Temp: 98.5 F (36.9 C) 98.6 F (37 C) 98.7 F (37.1 C) 98.7 F (37.1 C)  TempSrc: Oral Oral Oral Oral  SpO2: 97% 98% 95% 95%  Weight: 98.9 kg   85.7 kg    Intake/Output Summary (Last 24 hours) at 05/04/2020 0806 Last data filed at 05/03/2020 2200 Gross per 24 hour  Intake 720 ml  Output --  Net 720 ml   Filed Weights   05/02/20 0437 05/03/20 0422 05/04/20 0430  Weight: 98.8 kg 98.9 kg 85.7 kg    Examination:  General exam: No acute distress.   Respiratory system: Bilateral decreased breath sounds at bases with some basilar crackles, no wheezing  cardiovascular system: Rate controlled, S1-S2 heard gastrointestinal system: Abdomen is nondistended, soft and nontender.  Bowel sounds are heard  extremities: Mild lower extremity edema present.  No clubbing   Data  Reviewed: I have personally reviewed following labs and imaging studies  CBC: Recent Labs  Lab 04/29/20 0324 04/30/20 0338 05/01/20 0445 05/02/20 0113 05/04/20 0207  WBC 9.2 8.7 8.5 8.6 7.4  NEUTROABS 6.3 5.8 6.1 6.1 5.1  HGB 7.9* 8.2* 7.8* 7.9* 7.9*  HCT 24.2* 24.8* 24.6* 25.4* 25.5*  MCV 95.7 98.8 100.0 99.6 100.4*  PLT 311 346 334 339 259   Basic Metabolic Panel: Recent Labs  Lab 04/27/20 1026 04/27/20 1026 04/28/20 0316 04/28/20 0316 04/28/20 0808 04/28/20 0808 04/29/20 0324 04/30/20 0338 05/01/20 0445 05/02/20 0113 05/04/20 0207  NA 137   < > 142   < > 139   < > 142 143 141 141 142  K 3.0*   < > 3.3*   < > 3.7   < > 3.6 4.4 3.8 3.8 4.1  CL 103   < > 108   < > 105   < > 107 109 107 106 106  CO2 16*   < > 19*   < > 18*   < > 20* 20* 23 24 26   GLUCOSE 195*   < > 167*   < > 176*   < > 166* 149* 130* 111* 105*  BUN 92*   < > 82*   < >  81*   < > 71* 62* 49* 46* 34*  CREATININE 4.28*   < > 3.80*   < > 3.91*   < > 3.30* 2.83* 2.58* 2.55* 2.28*  CALCIUM 8.0*   < > 8.5*   < > 9.0   < > 8.8* 8.7* 8.7* 8.6* 8.7*  MG 2.0   < > 1.8   < > 2.1   < > 1.8 1.7 1.8 1.7 1.7  PHOS 4.2  --  3.6  --  3.5  --  3.5  --   --   --   --    < > = values in this interval not displayed.   GFR: CrCl cannot be calculated (Unknown ideal weight.). Liver Function Tests: Recent Labs  Lab 04/27/20 1026 04/28/20 0316 04/28/20 0808 04/29/20 0324  ALBUMIN 2.0* 1.9* 2.1* 1.9*   No results for input(s): LIPASE, AMYLASE in the last 168 hours. No results for input(s): AMMONIA in the last 168 hours. Coagulation Profile: No results for input(s): INR, PROTIME in the last 168 hours. Cardiac Enzymes: No results for input(s): CKTOTAL, CKMB, CKMBINDEX, TROPONINI in the last 168 hours. BNP (last 3 results) No results for input(s): PROBNP in the last 8760 hours. HbA1C: No results for input(s): HGBA1C in the last 72 hours. CBG: Recent Labs  Lab 05/03/20 0830 05/03/20 1205 05/03/20 1744  05/03/20 2057 05/04/20 0714  GLUCAP 153* 137* 126* 81 99   Lipid Profile: No results for input(s): CHOL, HDL, LDLCALC, TRIG, CHOLHDL, LDLDIRECT in the last 72 hours. Thyroid Function Tests: No results for input(s): TSH, T4TOTAL, FREET4, T3FREE, THYROIDAB in the last 72 hours. Anemia Panel: No results for input(s): VITAMINB12, FOLATE, FERRITIN, TIBC, IRON, RETICCTPCT in the last 72 hours. Sepsis Labs: No results for input(s): PROCALCITON, LATICACIDVEN in the last 168 hours.  Recent Results (from the past 240 hour(s))  Culture, blood (routine x 2)     Status: None   Collection Time: 04/25/20  5:07 PM   Specimen: BLOOD  Result Value Ref Range Status   Specimen Description BLOOD LEFT ANTECUBITAL  Final   Special Requests   Final    BOTTLES DRAWN AEROBIC AND ANAEROBIC Blood Culture adequate volume   Culture   Final    NO GROWTH 5 DAYS Performed at [facility], [address] [place], [place] [address]    Report Status 04/30/2020 FINAL  Final  Respiratory Panel by RT PCR (Flu A&B, Covid) - Nasopharyngeal Swab     Status: None   Collection Time: 04/25/20  7:28 PM   Specimen: Nasopharyngeal Swab  Result Value Ref Range Status   SARS Coronavirus 2 by RT PCR NEGATIVE NEGATIVE Final    Comment: (NOTE) SARS-CoV-2 target nucleic acids are NOT DETECTED.  The SARS-CoV-2 RNA is generally detectable in upper respiratoy specimens during the acute phase of infection. The lowest concentration of SARS-CoV-2 viral copies this assay can detect is 131 copies/mL. A negative result does not preclude SARS-Cov-2 infection and should not be used as the sole basis for treatment or other patient management decisions. A negative result may occur with  improper specimen collection/handling, submission of specimen other than nasopharyngeal swab, presence of viral mutation(s) within the areas targeted by this assay, and inadequate number of viral copies (<131 copies/mL). A negative result must be  combined with clinical observations, patient history, and epidemiological information. The expected result is Negative.  Fact Sheet for Patients:  PinkCheek.be  Fact Sheet for Healthcare Providers:  GravelBags.it  This test  is no t yet approved or cleared by the [place] and  has been authorized for detection and/or diagnosis of SARS-CoV-2 by FDA under an Emergency Use Authorization (EUA). This EUA will remain  in effect (meaning this test can be used) for the duration of the COVID-19 declaration under Section 564(b)(1) of the Act, 21 U.S.C. section 360bbb-3(b)(1), unless the authorization is terminated or revoked sooner.     Influenza A by PCR NEGATIVE NEGATIVE Final   Influenza B by PCR NEGATIVE NEGATIVE Final    Comment: (NOTE) The Xpert Xpress SARS-CoV-2/FLU/RSV assay is intended as an aid in  the diagnosis of influenza from Nasopharyngeal swab specimens and  should not be used as a sole basis for treatment. Nasal washings and  aspirates are unacceptable for Xpert Xpress SARS-CoV-2/FLU/RSV  testing.  Fact Sheet for Patients: PinkCheek.be  Fact Sheet for Healthcare Providers: GravelBags.it  This test is not yet approved or cleared by the [place] FDA and  has been authorized for detection and/or diagnosis of SARS-CoV-2 by  FDA under an Emergency Use Authorization (EUA). This EUA will remain  in effect (meaning this test can be used) for the duration of the  Covid-19 declaration under Section 564(b)(1) of the Act, 21  U.S.C. section 360bbb-3(b)(1), unless the authorization is  terminated or revoked. Performed at [facility], [place] [address], [place], [place] [address]   Culture, blood (routine x 2)     Status: None   Collection Time: 04/25/20  7:29 PM   Specimen: BLOOD  Result Value Ref Range Status   Specimen Description BLOOD SITE  NOT SPECIFIED  Final   Special Requests   Final    BOTTLES DRAWN AEROBIC AND ANAEROBIC Blood Culture results may not be optimal due to an inadequate volume of blood received in culture bottles   Culture   Final    NO GROWTH 5 DAYS Performed at [facility], [place] [address], [place], [place] [address]    Report Status 04/30/2020 FINAL  Final  Urine culture     Status: None   Collection Time: 04/25/20 10:00 PM   Specimen: Urine, Random  Result Value Ref Range Status   Specimen Description URINE, RANDOM  Final   Special Requests NONE  Final   Culture   Final    NO GROWTH Performed at [facility], [place] [address], [place], [place] [address]    Report Status 04/27/2020 FINAL  Final         Radiology Studies: No results found.      Scheduled Meds: . sodium chloride   Intravenous Once  . acetaminophen  650 mg Oral Once  . ARIPiprazole  5 mg Oral Daily  . atorvastatin  80 mg Oral q1800  . calcium carbonate  1,000 mg of elemental calcium Oral QHS  . clopidogrel  75 mg Oral Daily  . heparin  5,000 Units Subcutaneous Q8H  . insulin aspart  0-9 Units Subcutaneous TID WC  . magnesium oxide  400 mg Oral BID  . mirtazapine  15 mg Oral QHS  . venlafaxine XR  150 mg Oral Daily   Continuous Infusions:         Aline August, MD [facility] 05/04/2020, 8:06 AM

## 2020-05-04 NOTE — Progress Notes (Signed)
Physical Therapy Treatment Patient Details Name: [name] MRN: [id number] DOB: [birth year] Today's Date: 05/04/2020    History of Present Illness 79 yo female with onset of AKI and AMS was brought to ED, noted atelectasis, cholelithiasis, acute encephalopathy, SIRS, and concerns about renal mass.  PMHx:  R eye macular degeneration, CVA 2019, colon CA, radiation therapy, thyroid nodule, atherosclerosis, HTN, CKD4, stroke    PT Comments    Pt progressing towards her physical therapy goals. Agreeable to participate with encouragement. Session focused on therapeutic exercises for strengthening and functional mobility. Pt ambulating 100 feet with a walker at a min guard assist level. Gait speed of 1.13 ft/s indicative that pt is at high risk for falls. Continue to recommend post acute rehab for ongoing Physical Therapy.       Follow Up Recommendations  SNF     Equipment Recommendations  None recommended by PT    Recommendations for Other Services       Precautions / Restrictions Precautions Precautions: Fall Restrictions Weight Bearing Restrictions: No    Mobility  Bed Mobility Overal bed mobility: Modified Independent                Transfers Overall transfer level: Needs assistance Equipment used: Rolling walker (2 wheeled) Transfers: Sit to/from Stand Sit to Stand: Supervision            Ambulation/Gait Ambulation/Gait assistance: Min guard Gait Distance (Feet): 100 Feet Assistive device: Rolling walker (2 wheeled) Gait Pattern/deviations: Step-through pattern;Decreased stride length Gait velocity: 1.13 ft/s Gait velocity interpretation: <1.31 ft/sec, indicative of household ambulator General Gait Details: Slow pace, cues for activity pacing, min guard for safety   Stairs             Wheelchair Mobility    Modified Rankin (Stroke Patients Only)       Balance Overall balance assessment: Needs assistance Sitting-balance support: Feet  supported Sitting balance-Leahy Scale: Good     Standing balance support: No upper extremity supported Standing balance-Leahy Scale: Fair                              Cognition Arousal/Alertness: Awake/alert Behavior During Therapy: WFL for tasks assessed/performed Overall Cognitive Status: Within Functional Limits for tasks assessed                                 General Comments: WFL for mobility session      Exercises General Exercises - Lower Extremity Long Arc Quad: Both;10 reps;Seated Hip Flexion/Marching: Both;10 reps;Seated Other Exercises Other Exercises: x5 Sit to Stands    General Comments        Pertinent Vitals/Pain Pain Assessment: No/denies pain    Home Living                      Prior Function            PT Goals (current goals can now be found in the care plan section) Acute Rehab PT Goals Potential to Achieve Goals: Good Progress towards PT goals: Progressing toward goals    Frequency    Min 2X/week      PT Plan Discharge plan needs to be updated    Co-evaluation              AM-PAC PT "6 Clicks" Mobility   Outcome Measure  Help needed turning from your back  to your side while in a flat bed without using bedrails?: None Help needed moving from lying on your back to sitting on the side of a flat bed without using bedrails?: None Help needed moving to and from a bed to a chair (including a wheelchair)?: A Little Help needed standing up from a chair using your arms (e.g., wheelchair or bedside chair)?: A Little Help needed to walk in hospital room?: A Little Help needed climbing 3-5 steps with a railing? : A Little 6 Click Score: 20    End of Session Equipment Utilized During Treatment: Gait belt Activity Tolerance: Patient tolerated treatment well Patient left: in chair;with call bell/phone within reach;with chair alarm set   PT Visit Diagnosis: Unsteadiness on feet (R26.81);Muscle  weakness (generalized) (M62.81);Difficulty in walking, not elsewhere classified (R26.2)     Time: 0301-4996 PT Time Calculation (min) (ACUTE ONLY): 13 min  Charges:  $Therapeutic Activity: 8-22 mins                     Wyona Almas, PT, DPT Acute Rehabilitation Services Pager [phone] Office [phone]    Deno Etienne 05/04/2020, 12:12 PM

## 2020-05-05 ENCOUNTER — Other Ambulatory Visit: Payer: Self-pay

## 2020-05-05 ENCOUNTER — Encounter (HOSPITAL_COMMUNITY): Payer: Self-pay | Admitting: Internal Medicine

## 2020-05-05 DIAGNOSIS — F418 Other specified anxiety disorders: Secondary | ICD-10-CM | POA: Diagnosis not present

## 2020-05-05 DIAGNOSIS — I77810 Thoracic aortic ectasia: Secondary | ICD-10-CM | POA: Diagnosis not present

## 2020-05-05 DIAGNOSIS — N179 Acute kidney failure, unspecified: Secondary | ICD-10-CM | POA: Diagnosis not present

## 2020-05-05 DIAGNOSIS — G9340 Encephalopathy, unspecified: Secondary | ICD-10-CM | POA: Diagnosis not present

## 2020-05-05 LAB — GLUCOSE, CAPILLARY
Glucose-Capillary: 85 mg/dL (ref 70–99)
Glucose-Capillary: 90 mg/dL (ref 70–99)

## 2020-05-05 LAB — SARS CORONAVIRUS 2 BY RT PCR (HOSPITAL ORDER, PERFORMED IN ~~LOC~~ HOSPITAL LAB): SARS Coronavirus 2: NEGATIVE

## 2020-05-05 MED ORDER — MAGNESIUM OXIDE 400 (241.3 MG) MG PO TABS
400.0000 mg | ORAL_TABLET | Freq: Two times a day (BID) | ORAL | 0 refills | Status: DC
Start: 2020-05-05 — End: 2021-06-09

## 2020-05-05 MED ORDER — LORAZEPAM 0.5 MG PO TABS: 0.5000 mg | ORAL_TABLET | Freq: Four times a day (QID) | ORAL | 0 refills | Status: AC | PRN

## 2020-05-05 MED ORDER — CALCIUM CARBONATE 1250 (500 CA) MG PO TABS: 1.0000 | ORAL_TABLET | Freq: Every day | ORAL | 0 refills | Status: DC

## 2020-05-05 MED ORDER — INSULIN ASPART 100 UNIT/ML ~~LOC~~ SOLN: 0.0000 [IU] | Freq: Three times a day (TID) | SUBCUTANEOUS | 0 refills | Status: DC

## 2020-05-05 NOTE — TOC Transition Note (Signed)
Transition of Care [facility]) - CM/SW Discharge Note   Patient Details  Name: Haedyn Breau MRN: [id number] Date of Birth: [birth year]  [facility] CM/SW Contact:  Trula Ore, [place] Phone Number: 05/05/2020, 11:38 AM   Clinical Narrative:     Patient will DC to: [facility]   Anticipated DC date: 05/05/2020  Family notified: Elta Guadeloupe  Transport by: Corey Harold  ?  Per MD patient ready for DC to [facility] . RN, patient, patient's family, and facility notified of DC. Discharge Summary sent to facility . RN given number for report tele# [phone] [phone]. DC packet on chart. Ambulance transport requested for patient.  CSW signing off.  Final next level of care: Skilled Nursing Facility Barriers to Discharge: Continued Medical Work up   Patient Goals and CMS Choice Patient states their goals for this hospitalization and ongoing recovery are:: to go to SNF CMS Medicare.gov Compare Post Acute Care list provided to:: Patient Choice offered to / list presented to : Patient, Adult Children  Discharge Placement              Patient chooses bed at: [facility] Patient to be transferred to facility by: [place] Name of family member notified: Elta Guadeloupe Patient and family notified of of transfer: 05/05/20  Discharge Plan and Services                                     Social Determinants of Health (SDOH) Interventions     Readmission Risk Interventions No flowsheet data found.

## 2020-05-05 NOTE — Progress Notes (Addendum)
Report called to receiving facility all questions answered.   1630: [facility] arrived to pick up patient. Patient discharged in stable condition to [facility].

## 2020-05-05 NOTE — Care Management Important Message (Signed)
Important Message  Patient Details  Name: [name] MRN: [id number] Date of Birth: 05-13-1941   Medicare Important Message Given:  Yes     Shelda Altes 05/05/2020, 1:19 PM

## 2020-05-05 NOTE — Discharge Instructions (Signed)
Chronic Kidney Disease, Adult Chronic kidney disease (CKD) happens when the kidneys are damaged over a long period of time. The kidneys are two organs that help with:  Getting rid of waste and extra fluid from the blood.  Making hormones that maintain the amount of fluid in your tissues and blood vessels.  Making sure that the body has the right amount of fluids and chemicals. Most of the time, CKD does not go away, but it can usually be controlled. Steps must be taken to slow down the kidney damage or to stop it from getting worse. If this is not done, the kidneys may stop working. Follow these instructions at home: Medicines  Take over-the-counter and prescription medicines only as told by your doctor. You may need to change the amount of medicines you take.  Do not take any new medicines unless your doctor says it is okay. Many medicines can make your kidney damage worse.  Do not take any vitamin and supplements unless your doctor says it is okay. Many vitamins and supplements can make your kidney damage worse. General instructions  Follow a diet as told by your doctor. You may need to stay away from: ? Alcohol. ? Salty foods. ? Foods that are high in:  Potassium.  Calcium.  Protein.  Do not use any products that contain nicotine or tobacco, such as cigarettes and e-cigarettes. If you need help quitting, ask your doctor.  Keep track of your blood pressure at home. Tell your doctor about any changes.  If you have diabetes, keep track of your blood sugar as told by your doctor.  Try to stay at a healthy weight. If you need help, ask your doctor.  Exercise at least 30 minutes a day, 5 days a week.  Stay up-to-date with your shots (immunizations) as told by your doctor.  Keep all follow-up visits as told by your doctor. This is important. Contact a doctor if:  Your symptoms get worse.  You have new symptoms. Get help right away if:  You have symptoms of end-stage  kidney disease. These may include: ? Headaches. ? Numbness in your hands or feet. ? Easy bruising. ? Having hiccups often. ? Chest pain. ? Shortness of breath. ? Stopping of menstrual periods in women.  You have a fever.  You have very little pee (urine).  You have pain or bleeding when you pee. Summary  Chronic kidney disease (CKD) happens when the kidneys are damaged over a long period of time.  Most of the time, this condition does not go away, but it can usually be controlled. Steps must be taken to slow down the kidney damage or to stop it from getting worse.  Treatment may include a combination of medicines and lifestyle changes. This information is not intended to replace advice given to you by your health care provider. Make sure you discuss any questions you have with your health care provider. Document Revised: 06/24/2017 Document Reviewed: 08/16/2016 Elsevier Patient Education  2020 [employer]  

## 2020-05-05 NOTE — Discharge Summary (Signed)
Physician Discharge Summary  [name] AYT:[id number] DOB: [birth year] DOA: 04/25/2020  PCP: Crist Infante, MD  Admit date: 04/25/2020 Discharge date: 05/05/2020  Admitted From: Home Disposition:SNF  Recommendations for Outpatient Follow-up:  1. Follow up with SNF provider at earliest convenience with repeat BMP in the next few days 2. Outpatient follow-up with cardiology and nephrology 3. Follow up in ED if symptoms worsen or new appear   Home Health: No Equipment/Devices: None  Discharge Condition: Stable CODE STATUS: Full Diet recommendation: Heart healthy/carb modified diet  Brief/Interim Summary: 78 y.o.femalewith medical history significant fortype 2 diabetes, HTN, HLD, CKD stage IV, history of unspecified CVA, and depression/anxiety was admitted with acute encephalopathy, SIRS, acute kidney injury on chronic kidney disease stage IV secondary to combined volume depletion and possible ATN, cholelithiasis with pericholecystic fluid.  She was also started on broad-spectrum antibiotics.  On presentation, creatinine was 5.3, baseline creatinine of 1.97.  CT of the head was negative for acute abnormality.  CT chest revealed 4 cm ascending aortic aneurysm and 2.4 cm left thyroid nodule.  Right upper quadrant ultrasound showed cholelithiasis with pericholecystic fluid but no evidence of cholecystitis.  Nephrology was consulted.  Cardiology was consulted for PVCs/?  Nonsustained VT's.  Subsequently, cardiology signed off.  Nephrology followed the patient along till renal function improved and signed off with recommendations for outpatient nephrology follow-up. PT recommended CIR placement.  CIR placement was denied by insurance.    Patient is currently medically stable for discharge to SNF.  She will be discharged to SNF once bed is available.  Discharge Diagnoses:   Acute metabolic encephalopathy ?  SIRS -Patient presented with acute encephalopathy with concern for some kind of  infection.  She was started on broad-spectrum antibiotics.  COVID-19 testing on presentation was negative.  Cultures have been negative so far. -Subsequently, mental status is much improved.  Still slow to respond. -No evidence of infection.  Antibiotics discontinued on 04/28/2020 after discussion with patient's son -CIR placement was denied by insurance.   -Patient is currently medically stable for discharge to SNF.  She will be discharged to SNF once bed is available.  AKI on CKD stage IV Acute metabolic acidosis -Prior creatinine was 1.97 2 years ago.  Creatinine 5.52 on presentation. -Likely from combination of hypovolemia and ATN -Nephrology following.  IV fluids discontinued on 04/29/2020 by nephrology.  Acidosis resolved.  Off sodium bicarb tablets.. -Creatinine 2.28 on 05/04/2020. -Lasix and lisinopril held. -Outpatient follow-up with nephrology  Cholelithiasis -Cholelithiasis with mild amount of pericholecystic fluid on right upper quadrant ultrasound without evidence of acute cholecystitis. -Currently does not have any abdominal pain.  Only mildly elevated LFTs on presentation.  Will repeat LFTs in a.m. -No need for HIDA scan at this time.  Frequent PVCs/nonsustained V. Tach -Cardiology has signed off.  Recommend potassium and magnesium to be above 4 and 2 respectively.  No further work-up recommended.  Echo showed EF of 60 to 65%. -Outpatient follow-up with cardiology  Diabetes mellitus type 2 -Blood sugars currently on the lower side.  Continue CBGs with SSI at SNF.  70/30 insulin on hold   hypertension -Monitor blood pressure.  Lisinopril and Lasix on hold  History of unspecified CVA -Continue Plavix and statin  Hyperlipidemia -Continue statin  Anemia of chronic disease -Hemoglobin stable, 7.9 on 05/04/2020.  Transfuse if hemoglobin is less than 7.  Left thyroid nodule -2.4 cm thyroid nodule seen on CT imaging.  Consider follow-up thyroid ultrasound as an  outpatient.  Ascending aortic aneurysm -  4 cm ascending aortic aneurysm seen on CT.  Annual follow-up by CTA or MRA recommended.  Outpatient follow-up.  Recommend outpatient CT surgery evaluation  Complex right renal mass -Seen on abdominal ultrasound. -May be renal cyst seen on prior renal ultrasound however correlation with contrast-enhanced CT abdomen/pelviswasrecommended. Cannot obtain contrast imagingat this time due to AKI. Will need referral to urology when patient is stabilized.  Depression/anxiety -Continue Abilify and Remeron along with as needed Ativan.  Effexor was resumed on 04/30/2020 as well.  Generalized deconditioning/weakness -Continue PT at SNF.  Discharge Instructions  Discharge Instructions    Ambulatory referral to Cardiology   Complete by: As directed    Diet - low sodium heart healthy   Complete by: As directed    Diet Carb Modified   Complete by: As directed    Increase activity slowly   Complete by: As directed    No wound care   Complete by: As directed      Allergies as of 05/05/2020   No Known Allergies     Medication List    STOP taking these medications   furosemide 20 MG tablet Commonly known as: LASIX   insulin lispro protamine-lispro (75-25) 100 UNIT/ML Susp injection Commonly known as: HUMALOG 75/25 MIX   lisinopril 10 MG tablet Commonly known as: ZESTRIL   multivitamin capsule     TAKE these medications   acetaminophen 325 MG tablet Commonly known as: TYLENOL Take 2 tablets (650 mg total) by mouth every 4 (four) hours as needed for mild pain (or temp > 37.5 C (99.5 F)).   ARIPiprazole 5 MG tablet Commonly known as: ABILIFY Take 1 tablet (5 mg total) by mouth daily.   atorvastatin 80 MG tablet Commonly known as: LIPITOR Take 1 tablet (80 mg total) by mouth daily at 6 PM.   BD Pen Needle Nano 2nd Gen 32G X 4 MM Misc Generic drug: Insulin Pen Needle 1 each by Other route See admin instructions. Use with [place]  daily.   calcium carbonate 1250 (500 Ca) MG tablet Commonly known as: OS-CAL - dosed in mg of elemental calcium Take 1 tablet (500 mg of elemental calcium total) by mouth at bedtime.   clopidogrel 75 MG tablet Commonly known as: PLAVIX Take 1 tablet (75 mg total) by mouth daily.   docusate sodium 100 MG capsule Commonly known as: COLACE Take 100 mg by mouth 2 (two) times daily.   insulin aspart 100 UNIT/ML injection Commonly known as: novoLOG Inject 0-9 Units into the skin 3 (three) times daily with meals.   LORazepam 0.5 MG tablet Commonly known as: ATIVAN Take 1 tablet (0.5 mg total) by mouth every 6 (six) hours as needed for anxiety. What changed:   when to take this  reasons to take this   magnesium oxide 400 (241.3 Mg) MG tablet Commonly known as: MAG-OX Take 1 tablet (400 mg total) by mouth 2 (two) times daily.   mirtazapine 15 MG tablet Commonly known as: REMERON Take 1 tablet (15 mg total) by mouth at bedtime.   multivitamin-lutein Caps capsule Take 2 capsules by mouth daily.   omega-3 acid ethyl esters 1 g capsule Commonly known as: LOVAZA Take 1 capsule (1 g total) by mouth 2 (two) times daily. What changed: how much to take   OneTouch Verio test strip Generic drug: glucose blood 1 each by Other route See admin instructions. Use 1 strip to test blood glucose twice daily.   sennosides-docusate sodium 8.6-50 MG tablet Commonly known  as: SENOKOT-S Take 1 tablet by mouth daily.   venlafaxine XR 150 MG 24 hr capsule Commonly known as: EFFEXOR-XR Take 1 capsule by mouth daily.       Contact information for follow-up providers    Crist Infante, MD. Schedule an appointment as soon as possible for a visit in 1 week(s).   Specialty: Internal Medicine Why: with repeat cbc/bmp Contact information: [place] [address] [phone]        Elouise Munroe, MD .   Specialties: Cardiology, Radiology Contact information: [place] [address] [phone]        Corliss Parish, MD. Schedule an appointment as soon as possible for a visit in 1 week(s).   Specialty: Nephrology Contact information: [place]  [address] [phone]            Contact information for after-discharge care    Destination    [facility] Preferred SNF .   Service: Skilled Nursing Contact information: [address] [place] [place] [phone]                 No Known Allergies  Consultations:  Nephrology/cardiology   Procedures/Studies: DG Chest 2 View  Addendum Date: 04/25/2020   ADDENDUM REPORT: 04/25/2020 17:44 ADDENDUM: These results were called by telephone at the time of interpretation on 04/25/2020 at 5:36pm to provider JULIE HAVILAND , who verbally acknowledged these results. Electronically Signed   By: Iven Finn M.D.   On: 04/25/2020 17:44   Result Date: 04/25/2020 CLINICAL DATA:  Altered mental status.  Tachycardic. EXAM: CHEST - 2 VIEW.  Sitting upright AP. COMPARISON:  None. FINDINGS: Prominent cardiomediastinal silhouette with suggestion of a widened mediastinum on AP view without patient rotation. Aortic arch calcifications. Bibasilar hazy airspace opacities. Increased interstitial markings. No pulmonary edema. No pleural effusion. No pneumothorax. No acute osseous abnormality. IMPRESSION: 1. Prominent cardiomediastinal silhouette with mild pulmonary edema. 2. Bibasilar hazy airspace opacities likely represent atelectasis. Overlying infection/inflammation not excluded. Electronically Signed: By: Iven Finn M.D. On: 04/25/2020 17:32   DG Abd 1 View  Result Date: 04/26/2020 CLINICAL DATA:  Altered mental status. Nausea and vomiting. Hypertension. EXAM: ABDOMEN - 1 VIEW COMPARISON:  None. FINDINGS: Nonobstructive bowel gas pattern. No radio-opaque calculi or other significant radiographic abnormality are seen. Clips  project over the sacrum. Degenerative changes of the lumbar spine. Mild S-shaped thoracolumbar curvature. IMPRESSION: Nonobstructive bowel gas pattern. Electronically Signed   By: Margaretha Sheffield MD   On: 04/26/2020 11:37   CT Head Wo Contrast  Result Date: 04/25/2020 CLINICAL DATA:  Mental status change, unknown cause EXAM: CT HEAD WITHOUT CONTRAST TECHNIQUE: Contiguous axial images were obtained from the base of the skull through the vertex without intravenous contrast. COMPARISON:  CT head 03/24/18. FINDINGS: Brain: Patchy and confluent areas of decreased attenuation are noted throughout the deep and periventricular white matter of the cerebral hemispheres bilaterally, compatible with chronic microvascular ischemic disease. No evidence of acute infarction, hemorrhage, hydrocephalus, extra-axial collection or mass lesion/mass effect. Vascular: No hyperdense vessel or unexpected calcification. Skull: Negative for fracture or focal lesion. Sinuses/Orbits: No acute finding.  Bilateral lens replacement. Other: None. IMPRESSION: No acute intracranial abnormality. Electronically Signed   By: Iven Finn M.D.   On: 04/25/2020 17:44   CT Chest Wo Contrast  Result Date: 04/25/2020 CLINICAL DATA:  Respiratory illness, nondiagnostic xray Hypotension with nausea and vomiting. EXAM: CT CHEST WITHOUT CONTRAST TECHNIQUE: Multidetector CT imaging of the  chest was performed following the standard protocol without IV contrast. COMPARISON:  Chest radiograph earlier this day. No remote imaging available. FINDINGS: Cardiovascular: Ectatic ascending aorta at 4 cm. Mild diffuse aortic atherosclerosis. The descending aorta is tortuous. There is no periaortic stranding. Common origin of the brachiocephalic and left common carotid artery, variant arch anatomy. Left vertebral artery also arises from the aortic arch. Borderline cardiomegaly. There are coronary artery calcifications. No significant pericardial effusion.  Mediastinum/Nodes: There is a 2.4 cm hypodense left thyroid nodule. No enlarged mediastinal lymph nodes. There is no bulky hilar adenopathy. No esophageal wall thickening. Lungs/Pleura: Breathing motion artifact in the lung bases. Dependent lower lobe atelectasis, with additional scattered subsegmental atelectasis throughout both lungs. No septal thickening or ground-glass opacity to suggest pulmonary edema. No pulmonary mass or dominant pulmonary nodule, motion limited assessment. No significant pleural effusion. Upper Abdomen: Questionable edema in the gallbladder fossa included on the last image in the field of view. Liquid stool in the included colon. Musculoskeletal: There are no acute or suspicious osseous abnormalities. IMPRESSION: 1. Breathing motion artifact in the lung bases with scattered subsegmental atelectasis. No evidence of pneumonia. 2. 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 Aortic atherosclerosis. Recommend annual imaging followup by CTA or MRA. This recommendation follows 2010 ACCF/AHA/AATS/ACR/ASA/SCA/SCAI/SIR/STS/SVM Guidelines for the Diagnosis and Management of Patients with Thoracic Aortic Disease. Circulation. 2010; 121: A193-X902. Aortic aneurysm NOS (ICD10-I71.9) 3. Questionable edema in the gallbladder fossa included on the last image in the field of view. Recommend clinical correlation. 4. Left thyroid nodule measuring 2.4 cm. Recommend thyroid US, however if patient has significant comorbidities or limited life expectancy, no follow-up is recommended. (Ref: J Am Coll Radiol. 2015 Feb;12(2): 143-50). Aortic Atherosclerosis (ICD10-I70.0). Electronically Signed   By: Keith Rake M.D.   On: 04/25/2020 20:47   DG CHEST PORT 1 VIEW  Result Date: 04/27/2020 CLINICAL DATA:  Acute encephalopathy. EXAM: PORTABLE CHEST 1 VIEW COMPARISON:  Chest radiograph and chest CT from 04/25/2020 FINDINGS: Similar cardiomediastinal silhouette. See recent CT chest for  characterization of fusiform dilation of the ascending aorta. Calcific atherosclerosis of the aorta. Similar linear left basilar opacities. No visible pleural effusions or pneumothorax. No acute osseous abnormality. IMPRESSION: Similar linear left basilar opacities, characterized as atelectasis on recent CT chest. No new areas of consolidation. Electronically Signed   By: Margaretha Sheffield MD   On: 04/27/2020 11:04   ECHOCARDIOGRAM COMPLETE  Result Date: 04/27/2020    ECHOCARDIOGRAM REPORT   Patient Name:   [name] Date of Exam: 04/27/2020 Medical Rec #:  [id number]      Height:       67.5 in Accession #:    [id number]     Weight:       218.0 lb Date of Birth:  02/02/1941       BSA:          2.109 m Patient Age:    60 years       BP:           128/58 mmHg Patient Gender: F              HR:           107 bpm. Exam Location:  Inpatient Procedure: 2D Echo, Cardiac Doppler and Color Doppler Indications:    Dyspnea 786.09 / R06.00  History:        Patient has prior history of Echocardiogram examinations, most  recent 03/24/2018. Stroke; Risk Factors:Hypertension, Diabetes                 and Dyslipidemia. Breast cancer ([place]) (From Hx), CKD (chronic                 kidney disease), stage IV ([place]) (From Hx).  Sonographer:    Alvino Chapel RCS Referring Phys: [place]  1. Left ventricular ejection fraction, by estimation, is 60 to 65%. The left ventricle has normal function. The left ventricle has no regional wall motion abnormalities. Indeterminate diastolic filling due to E-A fusion.  2. Right ventricular systolic function is normal. The right ventricular size is normal. Tricuspid regurgitation signal is inadequate for assessing PA pressure.  3. The mitral valve is normal in structure. No evidence of mitral valve regurgitation. No evidence of mitral stenosis.  4. The aortic valve is tricuspid. There is moderate calcification of the aortic valve. Aortic valve  regurgitation is not visualized. Mild to moderate aortic valve sclerosis/calcification is present, without any evidence of aortic stenosis.  5. The inferior vena cava is normal in size with greater than 50% respiratory variability, suggesting right atrial pressure of 3 mmHg. FINDINGS  Left Ventricle: Left ventricular ejection fraction, by estimation, is 60 to 65%. The left ventricle has normal function. The left ventricle has no regional wall motion abnormalities. The left ventricular internal cavity size was normal in size. There is  no left ventricular hypertrophy. Indeterminate diastolic filling due to E-A fusion. Right Ventricle: The right ventricular size is normal. No increase in right ventricular wall thickness. Right ventricular systolic function is normal. Tricuspid regurgitation signal is inadequate for assessing PA pressure. Left Atrium: Left atrial size was normal in size. Right Atrium: Right atrial size was normal in size. Pericardium: There is no evidence of pericardial effusion. Mitral Valve: The mitral valve is normal in structure. No evidence of mitral valve regurgitation. No evidence of mitral valve stenosis. Tricuspid Valve: The tricuspid valve is normal in structure. Tricuspid valve regurgitation is not demonstrated. No evidence of tricuspid stenosis. Aortic Valve: The aortic valve is tricuspid. There is moderate calcification of the aortic valve. Aortic valve regurgitation is not visualized. Mild to moderate aortic valve sclerosis/calcification is present, without any evidence of aortic stenosis. Pulmonic Valve: The pulmonic valve was normal in structure. Pulmonic valve regurgitation is trivial. No evidence of pulmonic stenosis. Aorta: The aortic root is normal in size and structure. Venous: The inferior vena cava is normal in size with greater than 50% respiratory variability, suggesting right atrial pressure of 3 mmHg. IAS/Shunts: No atrial level shunt detected by color flow Doppler.  LEFT  VENTRICLE PLAX 2D LVIDd:         5.20 cm LVIDs:         3.10 cm LV PW:         0.90 cm LV IVS:        0.90 cm LVOT diam:     2.10 cm LV SV:         73 LV SV Index:   35 LVOT Area:     3.46 cm  RIGHT VENTRICLE TAPSE (M-mode): 2.0 cm LEFT ATRIUM             Index       RIGHT ATRIUM          Index LA diam:        2.90 cm 1.37 cm/m  RA Area:     8.94 cm LA Vol (A2C):  26.2 ml 12.42 ml/m RA Volume:   16.10 ml 7.63 ml/m LA Vol (A4C):   39.7 ml 18.82 ml/m LA Biplane Vol: 34.0 ml 16.12 ml/m  AORTIC VALVE LVOT Vmax:   130.00 cm/s LVOT Vmean:  86.700 cm/s LVOT VTI:    0.212 m  AORTA Ao Root diam: 3.20 cm MITRAL VALVE MV Area (PHT): 6.54 cm     SHUNTS MV Decel Time: 116 msec     Systemic VTI:  0.21 m MV E velocity: 131.00 cm/s  Systemic Diam: 2.10 cm Fransico Him MD Electronically signed by Fransico Him MD Signature Date/Time: 04/27/2020/1:34:33 PM    Final    US Abdomen Limited RUQ  Result Date: 04/25/2020 CLINICAL DATA:  Hypertension and altered mental status. EXAM: ULTRASOUND ABDOMEN LIMITED RIGHT UPPER QUADRANT COMPARISON:  October 31, 2019 FINDINGS: Gallbladder: Multiple shadowing echogenic foci are seen within the gallbladder lumen, near the gallbladder neck. The largest measures approximately 2.1 cm. A 4.7 mm nonshadowing echogenic focus is seen along the nondependent portion of the gallbladder wall. The gallbladder wall measures 4.0 mm in thickness. Mild amount of pericholecystic fluid is seen. No sonographic Murphy sign noted by sonographer. Common bile duct: Diameter: 2.4 mm Liver: No focal lesion identified. Within normal limits in parenchymal echogenicity. Portal vein is patent on color Doppler imaging with normal direction of blood flow towards the liver. Other: Of incidental note is the presence of a 4.0 cm x 3.4 cm x 3.8 cm complex hypoechoic and anechoic right renal mass. IMPRESSION: 1. Cholelithiasis and a mild amount of pericholecystic fluid, without evidence of acute cholecystitis. 2. Subcentimeter  gallbladder polyp. 3. Complex right renal mass which may correspond to one of the complex renal cysts seen on the prior renal ultrasound. Correlation with contrast-enhanced abdomen pelvis CT is recommended. Electronically Signed   By: Virgina Norfolk M.D.   On: 04/25/2020 21:56       Subjective: Patient is currently medically stable for discharge to SNF.  She will be discharged to SNF once bed is available.  Discharge Exam: Vitals:   05/05/20 0430 05/05/20 0743  BP: (!) 145/88 (!) 150/84  Pulse: 88 78  Resp: 20 17  Temp: 98.7 F (37.1 C) 98.4 F (36.9 C)  SpO2: 98%     General exam: No distress.  Elderly female sitting on the edge of the bed and having breakfast Respiratory system: Bilateral decreased breath sounds at bases with some scattered crackles  cardiovascular system: S1-S2 heard, rate controlled gastrointestinal system: Abdomen is nondistended, soft and nontender.  Normal bowel sounds heard  extremities: No cyanosis.  Trace lower extremity edema present   The results of significant diagnostics from this hospitalization (including imaging, microbiology, ancillary and laboratory) are listed below for reference.     Microbiology: Recent Results (from the past 240 hour(s))  Culture, blood (routine x 2)     Status: None   Collection Time: 04/25/20  5:07 PM   Specimen: BLOOD  Result Value Ref Range Status   Specimen Description BLOOD LEFT ANTECUBITAL  Final   Special Requests   Final    BOTTLES DRAWN AEROBIC AND ANAEROBIC Blood Culture adequate volume   Culture   Final    NO GROWTH 5 DAYS Performed at [facility], [address] [place], [place] [address]    Report Status 04/30/2020 FINAL  Final  Respiratory Panel by RT PCR (Flu A&B, Covid) - Nasopharyngeal Swab     Status: None   Collection Time: 04/25/20  7:28 PM  Specimen: Nasopharyngeal Swab  Result Value Ref Range Status   SARS Coronavirus 2 by RT PCR NEGATIVE NEGATIVE Final    Comment:  (NOTE) SARS-CoV-2 target nucleic acids are NOT DETECTED.  The SARS-CoV-2 RNA is generally detectable in upper respiratoy specimens during the acute phase of infection. The lowest concentration of SARS-CoV-2 viral copies this assay can detect is 131 copies/mL. A negative result does not preclude SARS-Cov-2 infection and should not be used as the sole basis for treatment or other patient management decisions. A negative result may occur with  improper specimen collection/handling, submission of specimen other than nasopharyngeal swab, presence of viral mutation(s) within the areas targeted by this assay, and inadequate number of viral copies (<131 copies/mL). A negative result must be combined with clinical observations, patient history, and epidemiological information. The expected result is Negative.  Fact Sheet for Patients:  PinkCheek.be  Fact Sheet for Healthcare Providers:  GravelBags.it  This test is no t yet approved or cleared by the [place] FDA and  has been authorized for detection and/or diagnosis of SARS-CoV-2 by FDA under an Emergency Use Authorization (EUA). This EUA will remain  in effect (meaning this test can be used) for the duration of the COVID-19 declaration under Section 564(b)(1) of the Act, 21 U.S.C. section 360bbb-3(b)(1), unless the authorization is terminated or revoked sooner.     Influenza A by PCR NEGATIVE NEGATIVE Final   Influenza B by PCR NEGATIVE NEGATIVE Final    Comment: (NOTE) The Xpert Xpress SARS-CoV-2/FLU/RSV assay is intended as an aid in  the diagnosis of influenza from Nasopharyngeal swab specimens and  should not be used as a sole basis for treatment. Nasal washings and  aspirates are unacceptable for Xpert Xpress SARS-CoV-2/FLU/RSV  testing.  Fact Sheet for Patients: PinkCheek.be  Fact Sheet for Healthcare  Providers: GravelBags.it  This test is not yet approved or cleared by the [place] FDA and  has been authorized for detection and/or diagnosis of SARS-CoV-2 by  FDA under an Emergency Use Authorization (EUA). This EUA will remain  in effect (meaning this test can be used) for the duration of the  Covid-19 declaration under Section 564(b)(1) of the Act, 21  U.S.C. section 360bbb-3(b)(1), unless the authorization is  terminated or revoked. Performed at [facility], [place] [address] [place], [place] [address]   Culture, blood (routine x 2)     Status: None   Collection Time: 04/25/20  7:29 PM   Specimen: BLOOD  Result Value Ref Range Status   Specimen Description BLOOD SITE NOT SPECIFIED  Final   Special Requests   Final    BOTTLES DRAWN AEROBIC AND ANAEROBIC Blood Culture results may not be optimal due to an inadequate volume of blood received in culture bottles   Culture   Final    NO GROWTH 5 DAYS Performed at [facility], [place] [address], [place], [place] [address]    Report Status 04/30/2020 FINAL  Final  Urine culture     Status: None   Collection Time: 04/25/20 10:00 PM   Specimen: Urine, Random  Result Value Ref Range Status   Specimen Description URINE, RANDOM  Final   Special Requests NONE  Final   Culture   Final    NO GROWTH Performed at [facility], [place] [address] [place], [place] [address]    Report Status 04/27/2020 FINAL  Final     Labs: BNP (last 3 results) No results for input(s): BNP in  the last 8760 hours. Basic Metabolic Panel: Recent Labs  Lab 04/29/20 0324 04/30/20 0338 05/01/20 0445 05/02/20 0113 05/04/20 0207  NA 142 143 141 141 142  K 3.6 4.4 3.8 3.8 4.1  CL 107 109 107 106 106  CO2 20* 20* 23 24 26   GLUCOSE 166* 149* 130* 111* 105*  BUN 71* 62* 49* 46* 34*  CREATININE 3.30* 2.83* 2.58* 2.55* 2.28*  CALCIUM 8.8* 8.7* 8.7* 8.6* 8.7*  MG 1.8 1.7 1.8 1.7 1.7  PHOS 3.5  --   --   --   --     Liver Function Tests: Recent Labs  Lab 04/29/20 0324  ALBUMIN 1.9*   No results for input(s): LIPASE, AMYLASE in the last 168 hours. No results for input(s): AMMONIA in the last 168 hours. CBC: Recent Labs  Lab 04/29/20 0324 04/30/20 0338 05/01/20 0445 05/02/20 0113 05/04/20 0207  WBC 9.2 8.7 8.5 8.6 7.4  NEUTROABS 6.3 5.8 6.1 6.1 5.1  HGB 7.9* 8.2* 7.8* 7.9* 7.9*  HCT 24.2* 24.8* 24.6* 25.4* 25.5*  MCV 95.7 98.8 100.0 99.6 100.4*  PLT 311 346 334 339 332   Cardiac Enzymes: No results for input(s): CKTOTAL, CKMB, CKMBINDEX, TROPONINI in the last 168 hours. BNP: Invalid input(s): POCBNP CBG: Recent Labs  Lab 05/04/20 0714 05/04/20 1132 05/04/20 1653 05/04/20 2022 05/05/20 0751  GLUCAP 99 172* 78 91 85   D-Dimer No results for input(s): DDIMER in the last 72 hours. Hgb A1c No results for input(s): HGBA1C in the last 72 hours. Lipid Profile No results for input(s): CHOL, HDL, LDLCALC, TRIG, CHOLHDL, LDLDIRECT in the last 72 hours. Thyroid function studies No results for input(s): TSH, T4TOTAL, T3FREE, THYROIDAB in the last 72 hours.  Invalid input(s): FREET3 Anemia work up No results for input(s): VITAMINB12, FOLATE, FERRITIN, TIBC, IRON, RETICCTPCT in the last 72 hours. Urinalysis    Component Value Date/Time   COLORURINE AMBER (A) 04/25/2020 2212   APPEARANCEUR CLOUDY (A) 04/25/2020 2212   LABSPEC 1.015 04/25/2020 2212   PHURINE 5.0 04/25/2020 2212   GLUCOSEU NEGATIVE 04/25/2020 2212   HGBUR NEGATIVE 04/25/2020 2212   BILIRUBINUR NEGATIVE 04/25/2020 2212   KETONESUR NEGATIVE 04/25/2020 2212   PROTEINUR 100 (A) 04/25/2020 2212   NITRITE NEGATIVE 04/25/2020 2212   LEUKOCYTESUR NEGATIVE 04/25/2020 2212   Sepsis Labs Invalid input(s): PROCALCITONIN,  WBC,  LACTICIDVEN Microbiology Recent Results (from the past 240 hour(s))  Culture, blood (routine x 2)     Status: None   Collection Time: 04/25/20  5:07 PM   Specimen: BLOOD  Result Value Ref Range  Status   Specimen Description BLOOD LEFT ANTECUBITAL  Final   Special Requests   Final    BOTTLES DRAWN AEROBIC AND ANAEROBIC Blood Culture adequate volume   Culture   Final    NO GROWTH 5 DAYS Performed at [facility], [address], [place], [place] [address]    Report Status 04/30/2020 FINAL  Final  Respiratory Panel by RT PCR (Flu A&B, Covid) - Nasopharyngeal Swab     Status: None   Collection Time: 04/25/20  7:28 PM   Specimen: Nasopharyngeal Swab  Result Value Ref Range Status   SARS Coronavirus 2 by RT PCR NEGATIVE NEGATIVE Final    Comment: (NOTE) SARS-CoV-2 target nucleic acids are NOT DETECTED.  The SARS-CoV-2 RNA is generally detectable in upper respiratoy specimens during the acute phase of infection. The lowest concentration of SARS-CoV-2 viral copies this assay can detect is 131 copies/mL. A negative result  does not preclude SARS-Cov-2 infection and should not be used as the sole basis for treatment or other patient management decisions. A negative result may occur with  improper specimen collection/handling, submission of specimen other than nasopharyngeal swab, presence of viral mutation(s) within the areas targeted by this assay, and inadequate number of viral copies (<131 copies/mL). A negative result must be combined with clinical observations, patient history, and epidemiological information. The expected result is Negative.  Fact Sheet for Patients:  PinkCheek.be  Fact Sheet for Healthcare Providers:  GravelBags.it  This test is no t yet approved or cleared by the [place] FDA and  has been authorized for detection and/or diagnosis of SARS-CoV-2 by FDA under an Emergency Use Authorization (EUA). This EUA will remain  in effect (meaning this test can be used) for the duration of the COVID-19 declaration under Section 564(b)(1) of the Act, 21 U.S.C. section 360bbb-3(b)(1), unless the  authorization is terminated or revoked sooner.     Influenza A by PCR NEGATIVE NEGATIVE Final   Influenza B by PCR NEGATIVE NEGATIVE Final    Comment: (NOTE) The Xpert Xpress SARS-CoV-2/FLU/RSV assay is intended as an aid in  the diagnosis of influenza from Nasopharyngeal swab specimens and  should not be used as a sole basis for treatment. Nasal washings and  aspirates are unacceptable for Xpert Xpress SARS-CoV-2/FLU/RSV  testing.  Fact Sheet for Patients: PinkCheek.be  Fact Sheet for Healthcare Providers: GravelBags.it  This test is not yet approved or cleared by the [place] FDA and  has been authorized for detection and/or diagnosis of SARS-CoV-2 by  FDA under an Emergency Use Authorization (EUA). This EUA will remain  in effect (meaning this test can be used) for the duration of the  Covid-19 declaration under Section 564(b)(1) of the Act, 21  U.S.C. section 360bbb-3(b)(1), unless the authorization is  terminated or revoked. Performed at [facility], [place] [address], [place], [place] [address]   Culture, blood (routine x 2)     Status: None   Collection Time: 04/25/20  7:29 PM   Specimen: BLOOD  Result Value Ref Range Status   Specimen Description BLOOD SITE NOT SPECIFIED  Final   Special Requests   Final    BOTTLES DRAWN AEROBIC AND ANAEROBIC Blood Culture results may not be optimal due to an inadequate volume of blood received in culture bottles   Culture   Final    NO GROWTH 5 DAYS Performed at [facility], [place] [address], [place], [place] [address]    Report Status 04/30/2020 FINAL  Final  Urine culture     Status: None   Collection Time: 04/25/20 10:00 PM   Specimen: Urine, Random  Result Value Ref Range Status   Specimen Description URINE, RANDOM  Final   Special Requests NONE  Final   Culture   Final    NO GROWTH Performed at [facility], [place] [address] [place], [place]  [address]    Report Status 04/27/2020 FINAL  Final     Time coordinating discharge: 35 minutes  SIGNED:   Aline August, MD  [facility] 05/05/2020, 10:44 AM

## 2020-05-05 NOTE — Progress Notes (Signed)
Patient ID: [name], female   DOB: [birth year], 79 y.o.   MRN: [id number]  PROGRESS NOTE    [name]  MVH:[id number] DOB: February 20, 1941 DOA: 04/25/2020 PCP: Crist Infante, MD   Brief Narrative:  79 y.o.femalewith medical history significant fortype 2 diabetes, HTN, HLD, CKD stage IV, history of unspecified CVA, and depression/anxiety was admitted with acute encephalopathy, SIRS, acute kidney injury on chronic kidney disease stage IV secondary to combined volume depletion and possible ATN, cholelithiasis with pericholecystic fluid.  She was also started on broad-spectrum antibiotics.  On presentation, creatinine was 5.3, baseline creatinine of 1.97.  CT of the head was negative for acute abnormality.  CT chest revealed 4 cm ascending aortic aneurysm and 2.4 cm left thyroid nodule.  Right upper quadrant ultrasound showed cholelithiasis with pericholecystic fluid but no evidence of cholecystitis.  Nephrology was consulted.  Currently, cardiology was consulted for PVCs/?  Nonsustained VT's.  PT recommended CIR placement.  CIR placement was denied by insurance.  Now looking for SNF placement.  Assessment & Plan:   Acute metabolic encephalopathy ?  SIRS -Patient presented with acute encephalopathy with concern for some kind of infection.  She was started on broad-spectrum antibiotics.  COVID-19 testing on presentation was negative.  Cultures have been negative so far. -Subsequently, mental status is much improved.  Still slow to respond. -No evidence of infection.  Antibiotics discontinued on 04/28/2020 after discussion with patient's son -CIR placement was denied by insurance.  Now looking for SNF placement.  Social worker following.  AKI on CKD stage IV Acute metabolic acidosis -Prior creatinine was 1.97 2 years ago.  Creatinine 5.52 on presentation. -Likely from combination of hypovolemia and ATN -Nephrology following.  IV fluids discontinued on 04/29/2020 by nephrology.  Acidosis resolved.   Off sodium bicarb tablets.. -Creatinine 2.28 on 05/04/2020. -Lasix and lisinopril held.  Cholelithiasis -Cholelithiasis with mild amount of pericholecystic fluid on right upper quadrant ultrasound without evidence of acute cholecystitis. -Currently does not have any abdominal pain.  Only mildly elevated LFTs on presentation.  Will repeat LFTs in a.m. -No need for HIDA scan at this time.  Frequent PVCs/nonsustained V. Tach -Cardiology has signed off.  Recommend potassium and magnesium to be above 4 and 2 respectively.  No further work-up recommended.  Echo showed EF of 60 to 65%.  Diabetes mellitus type 2 -Uncontrolled with hyperglycemia.  Continue CBGs with SSI  Hypertension -Monitor blood pressure.  Lisinopril and Lasix on hold  History of unspecified CVA -Continue Plavix and statin  Hyperlipidemia -Continue statin  Anemia of chronic disease -Hemoglobin stable, 7.9 on 05/04/2020.  Transfuse if hemoglobin is less than 7.  Left thyroid nodule -2.4 cm thyroid nodule seen on CT imaging.  Consider follow-up thyroid ultrasound as an outpatient.  Ascending aortic aneurysm -4 cm ascending aortic aneurysm seen on CT.  Annual follow-up by CTA or MRA recommended.  Outpatient follow-up.  Recommend outpatient CT surgery evaluation  Complex right renal mass -Seen on abdominal ultrasound. -May be renal cyst seen on prior renal ultrasound however correlation with contrast-enhanced CT abdomen/pelviswasrecommended. Cannot obtain contrast imagingat this time due to AKI. Will need referral to urology when patient is stabilized.  Depression/anxiety -Continue Abilify and Remeron along with as needed Ativan.  Effexor was resumed on 04/30/2020 as well.  Generalized deconditioning/weakness -Education officer, museum consult for SNF placement.  DVT prophylaxis: Subcutaneous heparin Code Status: Full Family Communication: Communicated with son/Dr. Phylliss Bob on 05/02/2020. Disposition Plan: Status is:  Inpatient  Remains inpatient appropriate because:Inpatient level of  care appropriate due to severity of illness.  SNF once bed is available   Dispo: The patient is from: Home              Anticipated d/c is to: SNF               Anticipated d/c date is: 1 day              Patient currently is medically stable to d/c.   Consultants: Nephrology/cardiology  Procedures: Echo as above  Antimicrobials:  Anti-infectives (From admission, onward)   Start     Dose/Rate Route Frequency Ordered Stop   04/26/20 2000  piperacillin-tazobactam (ZOSYN) IVPB 2.25 g  Status:  Discontinued        2.25 g 100 mL/hr over 30 Minutes Intravenous Every 8 hours 04/26/20 1116 04/28/20 2032   04/26/20 1030  piperacillin-tazobactam (ZOSYN) IVPB 3.375 g        3.375 g 100 mL/hr over 30 Minutes Intravenous  Once 04/26/20 1016 04/26/20 1239       Subjective: Patient seen and examined at bedside.  No fever, worsening shortness of breath, abdominal pain or vomiting reported.   Objective: Vitals:   05/04/20 1359 05/05/20 0430 05/05/20 0743 05/05/20 0845  BP: (!) 157/86 (!) 145/88 (!) 150/84   Pulse: 86 88 78   Resp: 20 20 17    Temp: 98.6 F (37 C) 98.7 F (37.1 C) 98.4 F (36.9 C)   TempSrc: Oral Oral Oral   SpO2: 99% 98%    Weight:  98.7 kg    Height:    5\' 9"  (1.753 m)   No intake or output data in the 24 hours ending 05/05/20 0939 Filed Weights   05/03/20 0422 05/04/20 0430 05/05/20 0430  Weight: 98.9 kg 85.7 kg 98.7 kg    Examination:  General exam: No distress.  Elderly female sitting on the edge of the bed and having breakfast Respiratory system: Bilateral decreased breath sounds at bases with some scattered crackles  cardiovascular system: S1-S2 heard, rate controlled gastrointestinal system: Abdomen is nondistended, soft and nontender.  Normal bowel sounds heard  extremities: No cyanosis.  Trace lower extremity edema present   Data Reviewed: I have personally reviewed following labs  and imaging studies  CBC: Recent Labs  Lab 04/29/20 0324 04/30/20 0338 05/01/20 0445 05/02/20 0113 05/04/20 0207  WBC 9.2 8.7 8.5 8.6 7.4  NEUTROABS 6.3 5.8 6.1 6.1 5.1  HGB 7.9* 8.2* 7.8* 7.9* 7.9*  HCT 24.2* 24.8* 24.6* 25.4* 25.5*  MCV 95.7 98.8 100.0 99.6 100.4*  PLT 311 346 334 339 300   Basic Metabolic Panel: Recent Labs  Lab 04/29/20 0324 04/30/20 0338 05/01/20 0445 05/02/20 0113 05/04/20 0207  NA 142 143 141 141 142  K 3.6 4.4 3.8 3.8 4.1  CL 107 109 107 106 106  CO2 20* 20* 23 24 26   GLUCOSE 166* 149* 130* 111* 105*  BUN 71* 62* 49* 46* 34*  CREATININE 3.30* 2.83* 2.58* 2.55* 2.28*  CALCIUM 8.8* 8.7* 8.7* 8.6* 8.7*  MG 1.8 1.7 1.8 1.7 1.7  PHOS 3.5  --   --   --   --    GFR: Estimated Creatinine Clearance: 25 mL/min (A) (by C-G formula based on SCr of 2.28 mg/dL (H)). Liver Function Tests: Recent Labs  Lab 04/29/20 0324  ALBUMIN 1.9*   No results for input(s): LIPASE, AMYLASE in the last 168 hours. No results for input(s): AMMONIA in the last 168 hours. Coagulation Profile: No results  for input(s): INR, PROTIME in the last 168 hours. Cardiac Enzymes: No results for input(s): CKTOTAL, CKMB, CKMBINDEX, TROPONINI in the last 168 hours. BNP (last 3 results) No results for input(s): PROBNP in the last 8760 hours. HbA1C: No results for input(s): HGBA1C in the last 72 hours. CBG: Recent Labs  Lab 05/04/20 0714 05/04/20 1132 05/04/20 1653 05/04/20 2022 05/05/20 0751  GLUCAP 99 172* 78 91 85   Lipid Profile: No results for input(s): CHOL, HDL, LDLCALC, TRIG, CHOLHDL, LDLDIRECT in the last 72 hours. Thyroid Function Tests: No results for input(s): TSH, T4TOTAL, FREET4, T3FREE, THYROIDAB in the last 72 hours. Anemia Panel: No results for input(s): VITAMINB12, FOLATE, FERRITIN, TIBC, IRON, RETICCTPCT in the last 72 hours. Sepsis Labs: No results for input(s): PROCALCITON, LATICACIDVEN in the last 168 hours.  Recent Results (from the past 240  hour(s))  Culture, blood (routine x 2)     Status: None   Collection Time: 04/25/20  5:07 PM   Specimen: BLOOD  Result Value Ref Range Status   Specimen Description BLOOD LEFT ANTECUBITAL  Final   Special Requests   Final    BOTTLES DRAWN AEROBIC AND ANAEROBIC Blood Culture adequate volume   Culture   Final    NO GROWTH 5 DAYS Performed at [facility], [address] [place], [place] [address]    Report Status 04/30/2020 FINAL  Final  Respiratory Panel by RT PCR (Flu A&B, Covid) - Nasopharyngeal Swab     Status: None   Collection Time: 04/25/20  7:28 PM   Specimen: Nasopharyngeal Swab  Result Value Ref Range Status   SARS Coronavirus 2 by RT PCR NEGATIVE NEGATIVE Final    Comment: (NOTE) SARS-CoV-2 target nucleic acids are NOT DETECTED.  The SARS-CoV-2 RNA is generally detectable in upper respiratoy specimens during the acute phase of infection. The lowest concentration of SARS-CoV-2 viral copies this assay can detect is 131 copies/mL. A negative result does not preclude SARS-Cov-2 infection and should not be used as the sole basis for treatment or other patient management decisions. A negative result may occur with  improper specimen collection/handling, submission of specimen other than nasopharyngeal swab, presence of viral mutation(s) within the areas targeted by this assay, and inadequate number of viral copies (<131 copies/mL). A negative result must be combined with clinical observations, patient history, and epidemiological information. The expected result is Negative.  Fact Sheet for Patients:  PinkCheek.be  Fact Sheet for Healthcare Providers:  GravelBags.it  This test is no t yet approved or cleared by the [place] FDA and  has been authorized for detection and/or diagnosis of SARS-CoV-2 by FDA under an Emergency Use Authorization (EUA). This EUA will remain  in effect (meaning this test can be  used) for the duration of the COVID-19 declaration under Section 564(b)(1) of the Act, 21 U.S.C. section 360bbb-3(b)(1), unless the authorization is terminated or revoked sooner.     Influenza A by PCR NEGATIVE NEGATIVE Final   Influenza B by PCR NEGATIVE NEGATIVE Final    Comment: (NOTE) The Xpert Xpress SARS-CoV-2/FLU/RSV assay is intended as an aid in  the diagnosis of influenza from Nasopharyngeal swab specimens and  should not be used as a sole basis for treatment. Nasal washings and  aspirates are unacceptable for Xpert Xpress SARS-CoV-2/FLU/RSV  testing.  Fact Sheet for Patients: PinkCheek.be  Fact Sheet for Healthcare Providers: GravelBags.it  This test is not yet approved or cleared by the [place] and  has been authorized for  detection and/or diagnosis of SARS-CoV-2 by  FDA under an Emergency Use Authorization (EUA). This EUA will remain  in effect (meaning this test can be used) for the duration of the  Covid-19 declaration under Section 564(b)(1) of the Act, 21  U.S.C. section 360bbb-3(b)(1), unless the authorization is  terminated or revoked. Performed at [facility], [place] [address] [place], [place] [address]   Culture, blood (routine x 2)     Status: None   Collection Time: 04/25/20  7:29 PM   Specimen: BLOOD  Result Value Ref Range Status   Specimen Description BLOOD SITE NOT SPECIFIED  Final   Special Requests   Final    BOTTLES DRAWN AEROBIC AND ANAEROBIC Blood Culture results may not be optimal due to an inadequate volume of blood received in culture bottles   Culture   Final    NO GROWTH 5 DAYS Performed at [facility], [place] [address] [place], [place] [address]    Report Status 04/30/2020 FINAL  Final  Urine culture     Status: None   Collection Time: 04/25/20 10:00 PM   Specimen: Urine, Random  Result Value Ref Range Status   Specimen Description URINE, RANDOM  Final    Special Requests NONE  Final   Culture   Final    NO GROWTH Performed at [facility], [place] [address] [place], [place] [address]    Report Status 04/27/2020 FINAL  Final         Radiology Studies: No results found.      Scheduled Meds: . sodium chloride   Intravenous Once  . acetaminophen  650 mg Oral Once  . ARIPiprazole  5 mg Oral Daily  . atorvastatin  80 mg Oral q1800  . calcium carbonate  1,000 mg of elemental calcium Oral QHS  . clopidogrel  75 mg Oral Daily  . heparin  5,000 Units Subcutaneous Q8H  . insulin aspart  0-9 Units Subcutaneous TID WC  . magnesium oxide  400 mg Oral BID  . mirtazapine  15 mg Oral QHS  . venlafaxine XR  150 mg Oral Daily   Continuous Infusions:         Aline August, MD [facility] 05/05/2020, 9:39 AM

## 2020-05-05 NOTE — TOC Progression Note (Signed)
Transition of Care [facility]) - Progression Note    Patient Details  Name: [name] MRN: [id number] Date of Birth: 01-09-1941  Transition of Care [facility]) CM/SW [place], [place] Phone Number: 05/05/2020, 10:06 AM  Clinical Narrative:     Claiborne Billings with [facility] confirmed that they can accept patient for SNF placement.CSW called [employer] to provide SNF choice to insurance. Insurance authorization has been approved. Auth ID #[id number]. Next review date is 10/12.  PASSR number is still pending. Claiborne Billings with [facility] said we can waive PASSR number.    CSW will continue to follow  Expected Discharge Plan: [place] Barriers to Discharge: Continued Medical Work up  Expected Discharge Plan and Services Expected Discharge Plan: [place]                                               Social Determinants of Health (SDOH) Interventions    Readmission Risk Interventions No flowsheet data found.

## 2020-05-12 ENCOUNTER — Encounter: Payer: Self-pay | Admitting: Internal Medicine

## 2020-05-27 ENCOUNTER — Other Ambulatory Visit: Payer: Self-pay | Admitting: Nephrology

## 2020-05-27 DIAGNOSIS — N184 Chronic kidney disease, stage 4 (severe): Secondary | ICD-10-CM

## 2020-06-06 ENCOUNTER — Ambulatory Visit: Payer: Medicare Other | Admitting: Podiatry

## 2020-06-25 ENCOUNTER — Other Ambulatory Visit: Payer: Self-pay | Admitting: Internal Medicine

## 2020-06-25 DIAGNOSIS — E041 Nontoxic single thyroid nodule: Secondary | ICD-10-CM

## 2020-06-25 DIAGNOSIS — N281 Cyst of kidney, acquired: Secondary | ICD-10-CM

## 2020-10-27 ENCOUNTER — Ambulatory Visit: Payer: Medicare Other | Admitting: Internal Medicine

## 2020-10-27 NOTE — Progress Notes (Deleted)
Cardiology Office Note:    Date:  10/27/2020   ID:  [name], DOB 1941-04-28, MRN [id number]  PCP:  Crist Infante, MD  Cardiologist:  Elouise Munroe, MD  Electrophysiologist:  None   Referring MD: Crist Infante, MD   Chief Complaint/Reason for Referral: Follow up  History of Present Illness:    Lauree Yurick is a 80 y.o. female with a history of tachycardia status post ablation in 2007, hypertension, hyperlipidemia, type 2 diabetes mellitus, chronic kidney disease stage III,breast cancer status post right lumpectomy and radiation, and colon cancer presents for follow up.     Past Medical History:  Diagnosis Date  . Anxiety   . Back ache   . Breast cancer ([place])    right  . Bronchitis   . Cancer of colon ([place])   . Carotid arterial disease ([place])    a. 02/2018 Carotid U/S: 1-39% bilat ICA stenosis.  . Chronic Dyspnea on exertion    a. 08/2017 MV: EF 62%, mid ant/apical ant defect. No ischemia-->low risk; b. 02/2018 Echo: EF 60-65%, no rwma, Gr1 DD. Mild AI. Asc Ao 29mm. Triv MR/TR/PR. Nl RV fxn.  . CKD (chronic kidney disease), stage IV ([place]) 2017  . Depression   . DM (diabetes mellitus) ([place])   . Family history of primary TB   . Fracture of ankle, closed 2014   left  . History of chicken pox   . History of hysterectomy 04/1980  . History of primary TB   . Hyperlipidemia   . Hypertension   . Knee pain, right   . Lung abnormality    age 64 patient had pna spot on lungs   . Microalbuminuria   . Muscle cramps    both legs  . Muscular degeneration    of right eye  . Nephrolithiasis   . Palpitations    a. 2007 s/p catheter ablation for tachycardia, though pt is not aware of details.  . Personal history of radiation therapy   . Stroke [facility])    a. 02/2018 non hemorrhagic 74mm R paramedian pontine infarct. Chronic microvascular ischemia.    Past Surgical History:  Procedure Laterality Date  . ABLATION  2007   fast heart rate  . ANKLE FRACTURE SURGERY Left 2014  .  BREAST LUMPECTOMY Right   . CATARACT EXTRACTION Right 2017  . colonscopy  2015  . HYSTEROTOMY     45 years ago  . TONSILLECTOMY AND ADENOIDECTOMY     age 44    Current Medications: No outpatient medications have been marked as taking for the 10/27/20 encounter (Appointment) with Elouise Munroe, MD.     Allergies:   Patient has no known allergies.   Social History   Tobacco Use  . Smoking status: Never Smoker  . Smokeless tobacco: Never Used  Vaping Use  . Vaping Use: Never used  Substance Use Topics  . Alcohol use: No  . Drug use: No     Family History: The patient's family history includes Asthma (age of onset: 8) in her brother; Cancer in her father; Kidney Stones in her brother; Peripheral Artery Disease (age of onset: 81) in her mother. There is no history of Breast cancer, Colon cancer, or Colon polyps.  ROS:   Please see the history of present illness.    All other systems reviewed and are negative.  EKGs/Labs/Other Studies Reviewed:    The following studies were reviewed today:  EKG:  ***  I have independently reviewed the images from ***.  Recent Labs: 04/26/2020: ALT 53; TSH 1.691 05/04/2020: BUN 34; Creatinine, Ser 2.28; Hemoglobin 7.9; Magnesium 1.7; Platelets 332; Potassium 4.1; Sodium 142  Recent Lipid Panel    Component Value Date/Time   CHOL 189 03/25/2018 0247   TRIG 578 (H) 03/25/2018 0247   HDL 25 (L) 03/25/2018 0247   CHOLHDL 7.6 03/25/2018 0247   VLDL UNABLE TO CALCULATE IF TRIGLYCERIDE OVER 400 mg/dL 03/25/2018 0247   LDLCALC UNABLE TO CALCULATE IF TRIGLYCERIDE OVER 400 mg/dL 03/25/2018 0247    Physical Exam:    VS:  There were no vitals taken for this visit.    Wt Readings from Last 5 Encounters:  05/05/20 217 lb 9.5 oz (98.7 kg)  10/12/19 191 lb (86.6 kg)  09/06/19 185 lb (83.9 kg)  11/06/18 183 lb (83 kg)  09/19/18 183 lb 9.6 oz (83.3 kg)    Constitutional: No acute distress Eyes: sclera non-icteric, normal conjunctiva and  lids ENMT: normal dentition, moist mucous membranes Cardiovascular: regular rhythm, normal rate, no murmurs. S1 and S2 normal. Radial pulses normal bilaterally. No jugular venous distention.  Respiratory: clear to auscultation bilaterally GI : normal bowel sounds, soft and nontender. No distention.   MSK: extremities warm, well perfused. No edema.  NEURO: grossly nonfocal exam, moves all extremities. PSYCH: alert and oriented x 3, normal mood and affect.   ASSESSMENT:    No diagnosis found. PLAN:    No diagnosis found.  Total time of encounter: *** minutes total time of encounter, including *** minutes spent in face-to-face patient care on the date of this encounter. This time includes coordination of care and counseling regarding above mentioned problem list. Remainder of non-face-to-face time involved reviewing chart documents/testing relevant to the patient encounter and documentation in the medical record. I have independently reviewed documentation from referring provider.   Cherlynn Kaiser, MD, [facility]    Medication Adjustments/Labs and Tests Ordered: Current medicines are reviewed at length with the patient today.  Concerns regarding medicines are outlined above.   No orders of the defined types were placed in this encounter.   Shared Decision Making/Informed Consent:   {Are you ordering a CV Procedure (e.g. stress test, cath, DCCV, TEE, etc)?   Press F2        :[id number]}   No orders of the defined types were placed in this encounter.   There are no Patient Instructions on file for this visit.

## 2020-12-12 ENCOUNTER — Ambulatory Visit: Payer: Medicare Other | Admitting: Internal Medicine

## 2020-12-12 NOTE — Progress Notes (Incomplete)
Cardiology Office Note   Date:  09/06/2019   ID:  [name], DOB 04/10/1941, MRN [id number]  PCP:  Crist Infante, MD  Cardiologist:  Elouise Munroe, MD  Electrophysiologist:  None   Evaluation Performed:  Follow-Up Visit  Chief Complaint:  Cardiovascular follow up  History of Present Illness:    [name] is a 80 y.o. female with tachycardia status post ablation in 2007, hypertension, hyperlipidemia, type 2 diabetes mellitus, chronic kidney disease stage III,breast cancer status post right lumpectomy and radiation, and colon cancerpresents for follow up.  She tells me her palpitations are gone which is excellent, she overall feels ok. No chest pain or worrisome shortness of breath. She takes Lasix 20 mg daily. She notes daily fatigue, and attributes this to not being able to do therapy after her stroke due the COVID 19 pandemic.  Negative ischemic and structural workup in 2019. No new symptoms.  12/12/2020  She denies any chest pain, shortness of breath, palpitations, or exertional symptoms. No headaches, lightheadedness, or syncope to report. Also has no lower extremity edema, orthopnea or PND.      Past Medical History:  Diagnosis Date   Anxiety    Back ache    Breast cancer ([place])    right   Bronchitis    Cancer of colon ([place])    Carotid arterial disease ([place])    a. 02/2018 Carotid U/S: 1-39% bilat ICA stenosis.   Chronic Dyspnea on exertion    a. 08/2017 MV: EF 62%, mid ant/apical ant defect. No ischemia-->low risk; b. 02/2018 Echo: EF 60-65%, no rwma, Gr1 DD. Mild AI. Asc Ao 22mm. Triv MR/TR/PR. Nl RV fxn.   CKD (chronic kidney disease), stage IV ([place]) 2017   Depression    DM (diabetes mellitus) ([place])    Family history of primary TB    Fracture of ankle, closed 2014   left   History of chicken pox    History of hysterectomy 04/1980   History of primary TB    Hyperlipidemia    Hypertension    Knee pain, right    Lung abnormality     age 71 patient had pna spot on lungs    Microalbuminuria    Muscle cramps    both legs   Muscular degeneration    of right eye   Nephrolithiasis    Palpitations    a. 2007 s/p catheter ablation for tachycardia, though pt is not aware of details.   Personal history of radiation therapy    Stroke ([place])    a. 02/2018 non hemorrhagic 79mm R paramedian pontine infarct. Chronic microvascular ischemia.   Past Surgical History:  Procedure Laterality Date   ABLATION  2007   fast heart rate   ANKLE FRACTURE SURGERY Left 2014   BREAST LUMPECTOMY Right    CATARACT EXTRACTION Right 2017   colonscopy  2015   HYSTEROTOMY     45 years ago   [place]     age 68     No outpatient medications have been marked as taking for the 12/12/20 encounter (Appointment) with Elouise Munroe, MD.     Allergies:   Patient has no known allergies.   Social History   Tobacco Use   Smoking status: Never Smoker   Smokeless tobacco: Never Used  Vaping Use   Vaping Use: Never used  Substance Use Topics   Alcohol use: No   Drug use: No     Family Hx: The patient's family  history includes Asthma (age of onset: 10) in her brother; Cancer in her father; Kidney Stones in her brother; Peripheral Artery Disease (age of onset: 34) in her mother. There is no history of Breast cancer, Colon cancer, or Colon polyps.  ROS:   Please see the history of present illness.    (+) All other systems reviewed and are negative.   Prior CV studies:   The following studies were reviewed today:  Echo 04/27/2020: 1. Left ventricular ejection fraction, by estimation, is 60 to 65%. The  left ventricle has normal function. The left ventricle has no regional  wall motion abnormalities. Indeterminate diastolic filling due to E-A  fusion.  2. Right ventricular systolic function is normal. The right ventricular  size is normal. Tricuspid regurgitation signal is inadequate for  assessing  PA pressure.  3. The mitral valve is normal in structure. No evidence of mitral valve  regurgitation. No evidence of mitral stenosis.  4. The aortic valve is tricuspid. There is moderate calcification of the  aortic valve. Aortic valve regurgitation is not visualized. Mild to  moderate aortic valve sclerosis/calcification is present, without any  evidence of aortic stenosis.  5. The inferior vena cava is normal in size with greater than 50%  respiratory variability, suggesting right atrial pressure of 3 mmHg.   Labs/Other Tests and Data Reviewed:    EKG:   12/12/2020: ***  Recent Labs: 04/26/2020: ALT 53; TSH 1.691 05/04/2020: BUN 34; Creatinine, Ser 2.28; Hemoglobin 7.9; Magnesium 1.7; Platelets 332; Potassium 4.1; Sodium 142   Recent Lipid Panel Lab Results  Component Value Date/Time   CHOL 189 03/25/2018 02:47 AM   TRIG 578 (H) 03/25/2018 02:47 AM   HDL 25 (L) 03/25/2018 02:47 AM   CHOLHDL 7.6 03/25/2018 02:47 AM   LDLCALC UNABLE TO CALCULATE IF TRIGLYCERIDE OVER 400 mg/dL 03/25/2018 02:47 AM    Wt Readings from Last 3 Encounters:  05/05/20 217 lb 9.5 oz (98.7 kg)  10/12/19 191 lb (86.6 kg)  09/06/19 185 lb (83.9 kg)     Objective:    Vital Signs:  There were no vitals taken for this visit.   Constitutional: No acute distress Eyes: sclera non-icteric, normal conjunctiva and lids ENMT: normal dentition, moist mucous membranes Cardiovascular: regular rhythm, normal rate, no murmurs. S1 and S2 normal. Radial pulses normal bilaterally. No jugular venous distention.  Respiratory: clear to auscultation bilaterally GI : normal bowel sounds, soft and nontender. No distention.   MSK: extremities warm, well perfused. No edema.  NEURO: grossly nonfocal exam, moves all extremities. PSYCH: alert and oriented x 3, normal mood and affect.    ASSESSMENT & PLAN:    1. Essential hypertension  - continue lasix 20 mg daily, lisinopril 10 mg daily. Stable.   2. Mixed  hyperlipidemia  - continue atorvastatin 80 mg daily.   3. Type 2 diabetes mellitus - on Humalog injections. Son encourages her to lose weight and get back in shape. We discussed exercise and diet strategies today.  4. Dyspnea on exertion - mild , stable, negative ischemic workup in 2019.   5. Acute ischemic stroke (HCC)  - plavix per neurology, continue, no bleeding complications.     .Total time of encounter: *** minutes total time of encounter, including *** minutes spent in face-to-face patient care on the date of this encounter. This time includes coordination of care and counseling regarding above mentioned problem list. Remainder of non-face-to-face time involved reviewing chart documents/testing relevant to the patient encounter and documentation in  the medical record. I have independently reviewed documentation from referring provider.   Cherlynn Kaiser, MD, [facility]     Medication Adjustments/Labs and Tests Ordered: Current medicines are reviewed at length with the patient today.  Concerns regarding medicines are outlined above.   Tests Ordered: No orders of the defined types were placed in this encounter.   Medication Changes: No orders of the defined types were placed in this encounter.    I,Mathew Stumpf,acting as a Education administrator for Elouise Munroe, MD.,have documented all relevant documentation on the behalf of Elouise Munroe, MD,as directed by  Elouise Munroe, MD while in the presence of Elouise Munroe, MD.  ***  Signed, Madelin Rear  12/12/2020 2:11 PM    [place]

## 2020-12-23 ENCOUNTER — Other Ambulatory Visit: Payer: Self-pay | Admitting: Adult Health

## 2020-12-23 DIAGNOSIS — E041 Nontoxic single thyroid nodule: Secondary | ICD-10-CM

## 2020-12-23 DIAGNOSIS — N184 Chronic kidney disease, stage 4 (severe): Secondary | ICD-10-CM

## 2021-01-09 ENCOUNTER — Ambulatory Visit
Admission: RE | Admit: 2021-01-09 | Discharge: 2021-01-09 | Disposition: A | Discharge status: Home / Self Care | Payer: Medicare Other | Source: Ambulatory Visit | Attending: Adult Health | Admitting: Adult Health

## 2021-01-09 ENCOUNTER — Other Ambulatory Visit: Payer: Self-pay

## 2021-01-09 DIAGNOSIS — E041 Nontoxic single thyroid nodule: Secondary | ICD-10-CM

## 2021-01-09 DIAGNOSIS — N184 Chronic kidney disease, stage 4 (severe): Secondary | ICD-10-CM

## 2021-04-24 ENCOUNTER — Other Ambulatory Visit: Payer: Self-pay

## 2021-04-24 DIAGNOSIS — N183 Chronic kidney disease, stage 3 unspecified: Secondary | ICD-10-CM

## 2021-05-08 ENCOUNTER — Encounter: Payer: Medicare Other | Admitting: Vascular Surgery

## 2021-05-08 ENCOUNTER — Encounter (HOSPITAL_COMMUNITY): Payer: Medicare Other

## 2021-05-08 ENCOUNTER — Other Ambulatory Visit (HOSPITAL_COMMUNITY): Payer: Medicare Other

## 2021-05-28 NOTE — Progress Notes (Signed)
Office Note     CC:  ESRD Requesting Provider:  Crist Infante, MD  HPI: [name] is a Right handed 80 y.o. (October 25, 1940) female with kidney disease who presents at the request of Crist Infante, MD for permanent HD access.  Patient was hospitalized earlier this month with acute encephalopathy, SIRS, acute kidney injury on chronic kidney disease stage IV secondary to combined volume depletion and possible ATN, cholelithiasis with pericholecystic fluid.  She was discharged to SNF.  [name] was accompanied by her daughter-in-law [name] during her visit today.  She was in good spirits, with no complaints.  [name] is aware of her chronic kidney disease, as well as the need for dialysis.  Her mother was on dialysis years ago prior to passing away.  The etiology of her kidney disease is believed to be from side effects of chemotherapeutic drugs used during her bout with breast cancer.  The patient has had no prior access procedures. No current HD lines  The pt is  on a statin for cholesterol management.  The pt is not on a daily aspirin.   Other AC:  plavix The pt is  on medications for hypertension.   The pt is  diabetic. Tobacco hx:  never  Past Medical History:  Diagnosis Date   Anxiety    Back ache    Breast cancer ([place])    right   Bronchitis    Cancer of colon ([place])    Carotid arterial disease ([place])    a. 02/2018 Carotid U/S: 1-39% bilat ICA stenosis.   Chronic Dyspnea on exertion    a. 08/2017 MV: EF 62%, mid ant/apical ant defect. No ischemia-->low risk; b. 02/2018 Echo: EF 60-65%, no rwma, Gr1 DD. Mild AI. Asc Ao 67mm. Triv MR/TR/PR. Nl RV fxn.   CKD (chronic kidney disease), stage IV ([place]) 2017   Depression    DM (diabetes mellitus) ([place])    Family history of primary TB    Fracture of ankle, closed 2014   left   History of chicken pox    History of hysterectomy 04/1980   History of primary TB    Hyperlipidemia    Hypertension    Knee pain, right    Lung abnormality    age 32  patient had pna spot on lungs    Microalbuminuria    Muscle cramps    both legs   Muscular degeneration    of right eye   Nephrolithiasis    Palpitations    a. 2007 s/p catheter ablation for tachycardia, though pt is not aware of details.   Personal history of radiation therapy    Stroke ([place])    a. 02/2018 non hemorrhagic 79mm R paramedian pontine infarct. Chronic microvascular ischemia.    Past Surgical History:  Procedure Laterality Date   ABLATION  2007   fast heart rate   ANKLE FRACTURE SURGERY Left 2014   BREAST LUMPECTOMY Right    CATARACT EXTRACTION Right 2017   colonscopy  2015   HYSTEROTOMY     45 years ago   TONSILLECTOMY AND ADENOIDECTOMY     age 42    Social History   Socioeconomic History   Marital status: Divorced    Spouse name: Not on file   Number of children: 2   Years of education: Not on file   Highest education level: Not on file  Occupational History   Not on file  Tobacco Use   Smoking status: Never   Smokeless tobacco: Never  Vaping Use   Vaping Use: Never used  Substance and Sexual Activity   Alcohol use: No   Drug use: No   Sexual activity: Not Currently  Other Topics Concern   Not on file  Social History Narrative   Not on file   Social Determinants of Health   Financial Resource Strain: Not on file  Food Insecurity: Not on file  Transportation Needs: Not on file  Physical Activity: Not on file  Stress: Not on file  Social Connections: Not on file  Intimate Partner Violence: Not on file    Family History  Problem Relation Age of Onset   Peripheral Artery Disease Mother 9       HX OF POOR CIRCULATION, SMOKING, LEG AMPUTATION   Cancer Father        cancer of spine   Asthma Brother 6   Kidney Stones Brother    Breast cancer Neg Hx    Colon cancer Neg Hx    Colon polyps Neg Hx     Current Outpatient Medications  Medication Sig Dispense Refill   acetaminophen (TYLENOL) 325 MG tablet Take 2 tablets (650 mg total) by  mouth every 4 (four) hours as needed for mild pain (or temp > 37.5 C (99.5 F)).     ARIPiprazole (ABILIFY) 5 MG tablet Take 1 tablet (5 mg total) by mouth daily. 30 tablet 3   atorvastatin (LIPITOR) 80 MG tablet Take 1 tablet (80 mg total) by mouth daily at 6 PM. 30 tablet 0   BD PEN NEEDLE NANO 2ND GEN 32G X 4 MM MISC 1 each by Other route See admin instructions. Use with [place] daily.     calcium carbonate (OS-CAL - DOSED IN MG OF ELEMENTAL CALCIUM) 1250 (500 Ca) MG tablet Take 1 tablet (500 mg of elemental calcium total) by mouth at bedtime. 30 tablet 0   clopidogrel (PLAVIX) 75 MG tablet Take 1 tablet (75 mg total) by mouth daily. 30 tablet 0   docusate sodium (COLACE) 100 MG capsule Take 100 mg by mouth 2 (two) times daily.     insulin aspart (NOVOLOG) 100 UNIT/ML injection Inject 0-9 Units into the skin 3 (three) times daily with meals. 10 mL 0   LORazepam (ATIVAN) 0.5 MG tablet Take 1 tablet (0.5 mg total) by mouth every 6 (six) hours as needed for anxiety. 14 tablet 0   magnesium oxide (MAG-OX) 400 (241.3 Mg) MG tablet Take 1 tablet (400 mg total) by mouth 2 (two) times daily. 30 tablet 0   mirtazapine (REMERON) 15 MG tablet Take 1 tablet (15 mg total) by mouth at bedtime. 30 tablet 1   multivitamin-lutein (OCUVITE-LUTEIN) CAPS capsule Take 2 capsules by mouth daily.     omega-3 acid ethyl esters (LOVAZA) 1 g capsule Take 1 capsule (1 g total) by mouth 2 (two) times daily. (Patient taking differently: Take 2 g by mouth 2 (two) times daily. ) 60 capsule 0   ONETOUCH VERIO test strip 1 each by Other route See admin instructions. Use 1 strip to test blood glucose twice daily.     sennosides-docusate sodium (SENOKOT-S) 8.6-50 MG tablet Take 1 tablet by mouth daily.     venlafaxine XR (EFFEXOR-XR) 150 MG 24 hr capsule Take 1 capsule by mouth daily.  3   No current facility-administered medications for this visit.    No Known Allergies   REVIEW OF SYSTEMS:   [X]  denotes positive finding,  [ ]  denotes negative finding Cardiac  Comments:  Chest  pain or chest pressure:    Shortness of breath upon exertion:    Short of breath when lying flat:    Irregular heart rhythm:        Vascular    Pain in calf, thigh, or hip brought on by ambulation:    Pain in feet at night that wakes you up from your sleep:     Blood clot in your veins:    Leg swelling:         Pulmonary    Oxygen at home:    Productive cough:     Wheezing:         Neurologic    Sudden weakness in arms or legs:     Sudden numbness in arms or legs:     Sudden onset of difficulty speaking or slurred speech:    Temporary loss of vision in one eye:     Problems with dizziness:         Gastrointestinal    Blood in stool:     Vomited blood:         Genitourinary    Burning when urinating:     Blood in urine:        Psychiatric    Major depression:         Hematologic    Bleeding problems:    Problems with blood clotting too easily:        Skin    Rashes or ulcers:        Constitutional    Fever or chills:      PHYSICAL EXAMINATION:  There were no vitals filed for this visit.  General:  WDWN in NAD; vital signs documented above Gait: Not observed HENT: WNL, normocephalic Pulmonary: normal non-labored breathing , without Rales, rhonchi,  wheezing Cardiac: regular HR,  Abdomen: soft, NT, no masses Skin: without rashes Vascular Exam/Pulses:  Right Left  Radial 2+ (normal) 2+ (normal)  Ulnar 2+ (normal) 2+ (normal)  Femoral    Popliteal    DP 2+ (normal) 2+ (normal)  PT     Extremities: without ischemic changes, without Gangrene , without cellulitis; without open wounds;  Musculoskeletal: no muscle wasting or atrophy  Neurologic: A&O X 3;  No focal weakness or paresthesias are detected Psychiatric:  The pt has Normal affect.   Non-Invasive Vascular Imaging:    +-----------------+-------------+----------+---------+  Right Cephalic   Diameter (cm)Depth (cm)Findings    +-----------------+-------------+----------+---------+  Shoulder             0.29        1.15              +-----------------+-------------+----------+---------+  Prox upper arm       0.30        0.80              +-----------------+-------------+----------+---------+  Mid upper arm        0.41        0.65              +-----------------+-------------+----------+---------+  Dist upper arm       0.29        0.66              +-----------------+-------------+----------+---------+  Antecubital fossa    0.23        0.53   branching  +-----------------+-------------+----------+---------+  Prox forearm         0.28        0.79   branching  +-----------------+-------------+----------+---------+  Mid forearm          0.29        0.56              +-----------------+-------------+----------+---------+  Dist forearm         0.26        0.37   branching  +-----------------+-------------+----------+---------+  Wrist                0.24        0.23              +-----------------+-------------+----------+---------+   +-----------------+-------------+----------+---------+  Right Basilic    Diameter (cm)Depth (cm)Findings   +-----------------+-------------+----------+---------+  Prox upper arm       0.32        1.22              +-----------------+-------------+----------+---------+  Mid upper arm        0.33        1.55              +-----------------+-------------+----------+---------+  Dist upper arm       0.37        1.58              +-----------------+-------------+----------+---------+  Antecubital fossa    0.37        1.56              +-----------------+-------------+----------+---------+  Prox forearm         0.20        0.51   branching  +-----------------+-------------+----------+---------+  Mid forearm          0.16        0.27              +-----------------+-------------+----------+---------+  Distal  forearm       0.14        0.24              +-----------------+-------------+----------+---------+  Wrist                0.14        0.23              +-----------------+-------------+----------+---------+   +-----------------+-------------+----------+---------+  Left Cephalic    Diameter (cm)Depth (cm)Findings   +-----------------+-------------+----------+---------+  Shoulder             0.32        1.39              +-----------------+-------------+----------+---------+  Prox upper arm       0.36        1.30              +-----------------+-------------+----------+---------+  Mid upper arm        0.32        0.90              +-----------------+-------------+----------+---------+  Dist upper arm       0.31        0.62              +-----------------+-------------+----------+---------+  Antecubital fossa    0.33        0.36              +-----------------+-------------+----------+---------+  Prox forearm         0.26        0.79              +-----------------+-------------+----------+---------+  Mid forearm  0.27        0.58   branching  +-----------------+-------------+----------+---------+  Dist forearm         0.21        0.44              +-----------------+-------------+----------+---------+  Wrist                0.24        0.38              +-----------------+-------------+----------+---------+   +-----------------+-------------+----------+---------+  Left Basilic     Diameter (cm)Depth (cm)Findings   +-----------------+-------------+----------+---------+  Prox upper arm       0.51        1.35              +-----------------+-------------+----------+---------+  Mid upper arm        0.47        1.46              +-----------------+-------------+----------+---------+  Dist upper arm       0.41        1.60              +-----------------+-------------+----------+---------+  Antecubital  fossa    0.37        1.24              +-----------------+-------------+----------+---------+  Prox forearm         0.18        0.65   branching  +-----------------+-------------+----------+---------+  Mid forearm          0.16        0.54              +-----------------+-------------+----------+---------+  Distal forearm       0.16        0.25              +-----------------+-------------+----------+---------+  Wrist                0.15        0.26   branching  +-----------------+-------------+----------+---------+   Noninvasive arterial and venous duplex ultrasonography of the bilateral upper extremities were independently reviewed demonstrating adequate diameter cephalic and basilic veins bilaterally, 3+ millimeter brachial artery.  ASSESSMENT/PLAN:  [name] is a 80 y.o. female who presents with chronic kidney disease stage 4  Based on vein mapping and examination the patient has multiple upper extremity veins that could be used for fistula creation. Being that the patient is right-handed, and has right-sided breast cancer, I discussed left-sided brachiocephalic fistula creation with her. I had an extensive discussion with this patient in regards to the nature of access surgery, including risk, benefits, and alternatives.   The patient is aware that the risks of access surgery include but are not limited to: bleeding, infection, steal syndrome, nerve damage, ischemic monomelic neuropathy, failure of access to mature, complications related to venous hypertension, and possible need for additional access procedures in the future. The patient has  agreed to proceed with the above procedure which will be scheduled.  Broadus John, MD [facility] [phone]

## 2021-05-29 ENCOUNTER — Encounter: Payer: Self-pay | Admitting: Vascular Surgery

## 2021-05-29 ENCOUNTER — Ambulatory Visit (INDEPENDENT_AMBULATORY_CARE_PROVIDER_SITE_OTHER)
Admission: RE | Admit: 2021-05-29 | Discharge: 2021-05-29 | Disposition: A | Discharge status: Home / Self Care | Payer: Medicare Other | Source: Ambulatory Visit | Attending: Vascular Surgery | Admitting: Vascular Surgery

## 2021-05-29 ENCOUNTER — Ambulatory Visit (HOSPITAL_COMMUNITY)
Admission: RE | Admit: 2021-05-29 | Discharge: 2021-05-29 | Disposition: A | Discharge status: Home / Self Care | Payer: Medicare Other | Source: Ambulatory Visit | Attending: Vascular Surgery | Admitting: Vascular Surgery

## 2021-05-29 ENCOUNTER — Other Ambulatory Visit: Payer: Self-pay

## 2021-05-29 ENCOUNTER — Ambulatory Visit: Payer: Medicare Other | Admitting: Vascular Surgery

## 2021-05-29 VITALS — BP 179/95 | HR 116 | Temp 97.6°F | Resp 20 | Ht 69.0 in | Wt 178.0 lb

## 2021-05-29 DIAGNOSIS — N183 Chronic kidney disease, stage 3 unspecified: Secondary | ICD-10-CM | POA: Diagnosis not present

## 2021-05-29 DIAGNOSIS — N184 Chronic kidney disease, stage 4 (severe): Secondary | ICD-10-CM | POA: Diagnosis not present

## 2021-06-10 ENCOUNTER — Other Ambulatory Visit: Payer: Self-pay

## 2021-06-10 ENCOUNTER — Encounter (HOSPITAL_COMMUNITY): Payer: Self-pay | Admitting: Vascular Surgery

## 2021-06-10 NOTE — Progress Notes (Signed)
PCP - Dr. Joylene Draft  Cardiologist - Dr. Margaretann Loveless  EP-Denies  Endocrine-Denies  Pulm-Denies  Chest x-ray - Denies  EKG - 06/11/21 Day of surgery  Stress Test - 09/02/17 (E)  ECHO - 04/27/20 (E)  Cardiac Cath - Denies  AICD-na PM-na LOOP-na  Dialysis-Denies  Sleep Study - Denies CPAP - Denies  LABS- I-Stat-8  ASA-Denies Plavix- LD-11/11  ERAS- No  HA1C-04/26/20 (E): 7.8 Fasting Blood Sugar - 200 Checks Blood Sugar __2___ times a day, Per her daughter-in-law  [name], her meter was not working properly, so she has not been checking it as she should. They have fixed the issue as of today.  Anesthesia- No  Interview done with her daughter-in-law [name] and she denies the pt having chest pain, sob, or fever during the pre-op phone call. All instructions explained to [name], with a verbal understanding of the material including: As of today, stop taking all Aspirin (unless instructed by your doctor) and Other Aspirin containing products, Vitamins, Fish oils, and Herbal medications. Also stop all NSAIDS i.e. Advil, Ibuprofen, Motrin, Aleve, Anaprox, Naproxen, BC, Goody Powders, and all Supplements.     WHAT DO I DO ABOUT MY DIABETES MEDICATION?  THE NIGHT BEFORE SURGERY, take _____30______ units of ____Humalog_______insulin.   If your CBG is greater than 220 mg/dL, call the number above for further instructions.   How do I manage my blood sugar before surgery? Check your blood sugar at least 4 times a day, starting 2 days before surgery, to make sure that the level is not too high or low. Check your blood sugar the morning of your surgery when you wake up and every 2 hours until you get to the Short Stay unit. If your blood sugar is less than 70 mg/dL, you will need to treat for low blood sugar: Do not take insulin. Treat a low blood sugar (less than 70 mg/dL) with  cup of clear juice (cranberry or apple), 4 glucose tablets, OR glucose gel. Recheck blood sugar in 15 minutes  after treatment (to make sure it is greater than 70 mg/dL). If your blood sugar is not greater than 70 mg/dL on recheck, call [phone]  for further instructions. Report your blood sugar to the short stay nurse when you get to Short Stay.  Reviewed and Endorsed by [facility] Patient Education Committee, August 2015  [name] also instructed to have them wear a mask and social distance if they have to go out prior to the surgery. The opportunity to ask questions was provided.    Coronavirus Screening  Have you experienced the following symptoms:  Cough yes/no: No Fever (>100.86F)  yes/no: No Runny nose yes/no: No Sore throat yes/no: No Difficulty breathing/shortness of breath  yes/no: No  Have you or a family member traveled in the last 14 days and where? yes/no: No   If the patient indicates "YES" to the above questions, their PAT will be rescheduled to limit the exposure to others and, the surgeon will be notified. THE PATIENT WILL NEED TO BE ASYMPTOMATIC FOR 14 DAYS.   If the patient is not experiencing any of these symptoms, the PAT nurse will instruct them to NOT bring anyone with them to their appointment since they may have these symptoms or traveled as well.   Please remind your patients and families that hospital visitation restrictions are in effect and the importance of the restrictions.

## 2021-06-11 ENCOUNTER — Ambulatory Visit (HOSPITAL_COMMUNITY): Payer: Medicare Other | Admitting: Anesthesiology

## 2021-06-11 ENCOUNTER — Encounter (HOSPITAL_COMMUNITY)
Admission: RE | Disposition: A | Discharge status: Home / Self Care | Payer: Self-pay | Source: Home / Self Care | Attending: Vascular Surgery

## 2021-06-11 ENCOUNTER — Encounter (HOSPITAL_COMMUNITY): Payer: Self-pay | Admitting: Vascular Surgery

## 2021-06-11 ENCOUNTER — Ambulatory Visit (HOSPITAL_COMMUNITY)
Admission: RE | Admit: 2021-06-11 | Discharge: 2021-06-11 | Disposition: A | Discharge status: Home / Self Care | Payer: Medicare Other | Attending: Vascular Surgery | Admitting: Vascular Surgery

## 2021-06-11 DIAGNOSIS — N184 Chronic kidney disease, stage 4 (severe): Secondary | ICD-10-CM | POA: Insufficient documentation

## 2021-06-11 DIAGNOSIS — Z853 Personal history of malignant neoplasm of breast: Secondary | ICD-10-CM | POA: Diagnosis not present

## 2021-06-11 DIAGNOSIS — I129 Hypertensive chronic kidney disease with stage 1 through stage 4 chronic kidney disease, or unspecified chronic kidney disease: Secondary | ICD-10-CM | POA: Diagnosis not present

## 2021-06-11 DIAGNOSIS — Z79899 Other long term (current) drug therapy: Secondary | ICD-10-CM | POA: Diagnosis not present

## 2021-06-11 DIAGNOSIS — N186 End stage renal disease: Secondary | ICD-10-CM | POA: Diagnosis not present

## 2021-06-11 DIAGNOSIS — Z8673 Personal history of transient ischemic attack (TIA), and cerebral infarction without residual deficits: Secondary | ICD-10-CM | POA: Insufficient documentation

## 2021-06-11 DIAGNOSIS — Z9221 Personal history of antineoplastic chemotherapy: Secondary | ICD-10-CM | POA: Insufficient documentation

## 2021-06-11 DIAGNOSIS — D631 Anemia in chronic kidney disease: Secondary | ICD-10-CM | POA: Insufficient documentation

## 2021-06-11 DIAGNOSIS — E1122 Type 2 diabetes mellitus with diabetic chronic kidney disease: Secondary | ICD-10-CM | POA: Insufficient documentation

## 2021-06-11 HISTORY — PX: AV FISTULA PLACEMENT: SHX1204

## 2021-06-11 LAB — POCT I-STAT, CHEM 8
BUN: 36 mg/dL — ABNORMAL HIGH (ref 8–23)
Calcium, Ion: 1.33 mmol/L (ref 1.15–1.40)
Chloride: 109 mmol/L (ref 98–111)
Creatinine, Ser: 5.2 mg/dL — ABNORMAL HIGH (ref 0.44–1.00)
Glucose, Bld: 150 mg/dL — ABNORMAL HIGH (ref 70–99)
HCT: 28 % — ABNORMAL LOW (ref 36.0–46.0)
Hemoglobin: 9.5 g/dL — ABNORMAL LOW (ref 12.0–15.0)
Potassium: 3.8 mmol/L (ref 3.5–5.1)
Sodium: 143 mmol/L (ref 135–145)
TCO2: 23 mmol/L (ref 22–32)

## 2021-06-11 LAB — GLUCOSE, CAPILLARY
Glucose-Capillary: 154 mg/dL — ABNORMAL HIGH (ref 70–99)
Glucose-Capillary: 114 mg/dL — ABNORMAL HIGH (ref 70–99)
Glucose-Capillary: 114 mg/dL — ABNORMAL HIGH (ref 70–99)

## 2021-06-11 SURGERY — ARTERIOVENOUS (AV) FISTULA CREATION
Anesthesia: Regional | Laterality: Left

## 2021-06-11 MED ORDER — CEFAZOLIN SODIUM-DEXTROSE 2-4 GM/100ML-% IV SOLN
2.0000 g | INTRAVENOUS | Status: AC
  Administered 2021-06-11: 16:00:00 2 g via INTRAVENOUS
  Filled 2021-06-11: qty 100

## 2021-06-11 MED ORDER — PROPOFOL 10 MG/ML IV BOLUS
INTRAVENOUS | Status: DC | PRN
  Administered 2021-06-11: 20 mg via INTRAVENOUS

## 2021-06-11 MED ORDER — CHLORHEXIDINE GLUCONATE 4 % EX LIQD: 60.0000 mL | Freq: Once | CUTANEOUS | Status: DC

## 2021-06-11 MED ORDER — FENTANYL CITRATE (PF) 100 MCG/2ML IJ SOLN: 25.0000 ug | Freq: Once | INTRAMUSCULAR | Status: AC

## 2021-06-11 MED ORDER — LABETALOL HCL 5 MG/ML IV SOLN
INTRAVENOUS | Status: AC
  Filled 2021-06-11: qty 4

## 2021-06-11 MED ORDER — ROCURONIUM BROMIDE 10 MG/ML (PF) SYRINGE
PREFILLED_SYRINGE | INTRAVENOUS | Status: AC
  Filled 2021-06-11: qty 10

## 2021-06-11 MED ORDER — ONDANSETRON HCL 4 MG/2ML IJ SOLN
INTRAMUSCULAR | Status: DC | PRN
  Administered 2021-06-11: 4 mg via INTRAVENOUS

## 2021-06-11 MED ORDER — FENTANYL CITRATE (PF) 100 MCG/2ML IJ SOLN
INTRAMUSCULAR | Status: AC
  Administered 2021-06-11: 14:00:00 25 ug via INTRAVENOUS
  Filled 2021-06-11: qty 2

## 2021-06-11 MED ORDER — TRAMADOL HCL 50 MG PO TABS: 50.0000 mg | ORAL_TABLET | Freq: Four times a day (QID) | ORAL | 0 refills | Status: AC | PRN

## 2021-06-11 MED ORDER — PROPOFOL 500 MG/50ML IV EMUL
INTRAVENOUS | Status: DC | PRN
  Administered 2021-06-11: 50 ug/kg/min via INTRAVENOUS

## 2021-06-11 MED ORDER — PROPOFOL 10 MG/ML IV BOLUS
INTRAVENOUS | Status: AC
  Filled 2021-06-11: qty 20

## 2021-06-11 MED ORDER — PHENYLEPHRINE 40 MCG/ML (10ML) SYRINGE FOR IV PUSH (FOR BLOOD PRESSURE SUPPORT)
PREFILLED_SYRINGE | INTRAVENOUS | Status: AC
  Filled 2021-06-11: qty 10

## 2021-06-11 MED ORDER — HEPARIN SODIUM (PORCINE) 1000 UNIT/ML IJ SOLN
INTRAMUSCULAR | Status: DC | PRN
  Administered 2021-06-11: 2000 [IU] via INTRAVENOUS

## 2021-06-11 MED ORDER — ONDANSETRON HCL 4 MG/2ML IJ SOLN
INTRAMUSCULAR | Status: AC
  Filled 2021-06-11: qty 2

## 2021-06-11 MED ORDER — SODIUM CHLORIDE 0.9 % IV SOLN: INTRAVENOUS | Status: DC

## 2021-06-11 MED ORDER — HEPARIN SODIUM (PORCINE) 1000 UNIT/ML IJ SOLN
INTRAMUSCULAR | Status: AC
  Filled 2021-06-11: qty 2

## 2021-06-11 MED ORDER — HEPARIN 6000 UNIT IRRIGATION SOLUTION
Status: DC | PRN
  Administered 2021-06-11: 1

## 2021-06-11 MED ORDER — ORAL CARE MOUTH RINSE: 15.0000 mL | Freq: Once | OROMUCOSAL | Status: AC

## 2021-06-11 MED ORDER — BUPIVACAINE HCL (PF) 0.5 % IJ SOLN
INTRAMUSCULAR | Status: DC | PRN
  Administered 2021-06-11: 30 mL via PERINEURAL

## 2021-06-11 MED ORDER — HEPARIN 6000 UNIT IRRIGATION SOLUTION
Status: AC
  Filled 2021-06-11: qty 500

## 2021-06-11 MED ORDER — 0.9 % SODIUM CHLORIDE (POUR BTL) OPTIME
TOPICAL | Status: DC | PRN
  Administered 2021-06-11: 16:00:00 1000 mL

## 2021-06-11 MED ORDER — CLOPIDOGREL BISULFATE 75 MG PO TABS: 75.0000 mg | ORAL_TABLET | Freq: Every day | ORAL | 0 refills | Status: AC

## 2021-06-11 MED ORDER — LIDOCAINE 2% (20 MG/ML) 5 ML SYRINGE
INTRAMUSCULAR | Status: AC
  Filled 2021-06-11: qty 5

## 2021-06-11 MED ORDER — CHLORHEXIDINE GLUCONATE 0.12 % MT SOLN
15.0000 mL | Freq: Once | OROMUCOSAL | Status: AC
  Administered 2021-06-11: 13:00:00 15 mL via OROMUCOSAL
  Filled 2021-06-11: qty 15

## 2021-06-11 MED ORDER — DIPHENHYDRAMINE HCL 50 MG/ML IJ SOLN
INTRAMUSCULAR | Status: AC
  Filled 2021-06-11: qty 1

## 2021-06-11 MED ORDER — CEFAZOLIN SODIUM 1 G IJ SOLR
INTRAMUSCULAR | Status: AC
  Filled 2021-06-11: qty 20

## 2021-06-11 MED ORDER — PROTAMINE SULFATE 10 MG/ML IV SOLN
INTRAVENOUS | Status: AC
  Filled 2021-06-11: qty 5

## 2021-06-11 SURGICAL SUPPLY — 34 items
ARMBAND PINK RESTRICT EXTREMIT (MISCELLANEOUS) ×4 IMPLANT
BAG COUNTER SPONGE SURGICOUNT (BAG) ×2 IMPLANT
BLADE CLIPPER SURG (BLADE) ×2 IMPLANT
CANISTER SUCT 3000ML PPV (MISCELLANEOUS) ×2 IMPLANT
CLIP VESOCCLUDE MED 6/CT (CLIP) ×2 IMPLANT
CLIP VESOCCLUDE SM WIDE 6/CT (CLIP) ×4 IMPLANT
COVER PROBE W GEL 5X96 (DRAPES) ×4 IMPLANT
DECANTER SPIKE VIAL GLASS SM (MISCELLANEOUS) ×2 IMPLANT
DERMABOND ADVANCED (GAUZE/BANDAGES/DRESSINGS) ×1
DERMABOND ADVANCED .7 DNX12 (GAUZE/BANDAGES/DRESSINGS) ×1 IMPLANT
ELECT REM PT RETURN 9FT ADLT (ELECTROSURGICAL) ×2
ELECTRODE REM PT RTRN 9FT ADLT (ELECTROSURGICAL) ×1 IMPLANT
GLOVE SRG 8 PF TXTR STRL LF DI (GLOVE) ×2 IMPLANT
GLOVE SURG POLYISO LF SZ8 (GLOVE) IMPLANT
GLOVE SURG UNDER POLY LF SZ8 (GLOVE) ×2
GOWN STRL REUS W/ TWL LRG LVL3 (GOWN DISPOSABLE) ×2 IMPLANT
GOWN STRL REUS W/TWL 2XL LVL3 (GOWN DISPOSABLE) ×2 IMPLANT
GOWN STRL REUS W/TWL LRG LVL3 (GOWN DISPOSABLE) ×2
HEMOSTAT SPONGE AVITENE ULTRA (HEMOSTASIS) IMPLANT
KIT BASIN OR (CUSTOM PROCEDURE TRAY) ×2 IMPLANT
KIT TURNOVER KIT B (KITS) ×2 IMPLANT
NS IRRIG 1000ML POUR BTL (IV SOLUTION) ×2 IMPLANT
PACK CV ACCESS (CUSTOM PROCEDURE TRAY) ×2 IMPLANT
PAD ARMBOARD 7.5X6 YLW CONV (MISCELLANEOUS) ×4 IMPLANT
SUT MNCRL AB 4-0 PS2 18 (SUTURE) ×2 IMPLANT
SUT PROLENE 6 0 BV (SUTURE) ×4 IMPLANT
SUT PROLENE 7 0 BV 1 (SUTURE) IMPLANT
SUT SILK 2 0 SH (SUTURE) ×2 IMPLANT
SUT SILK 3 0SH CR/8 30 (SUTURE) ×2 IMPLANT
SUT VIC AB 3-0 SH 27 (SUTURE) ×1
SUT VIC AB 3-0 SH 27X BRD (SUTURE) ×1 IMPLANT
TOWEL GREEN STERILE (TOWEL DISPOSABLE) ×2 IMPLANT
UNDERPAD 30X36 HEAVY ABSORB (UNDERPADS AND DIAPERS) ×2 IMPLANT
WATER STERILE IRR 1000ML POUR (IV SOLUTION) ×2 IMPLANT

## 2021-06-11 NOTE — Anesthesia Preprocedure Evaluation (Addendum)
Anesthesia Evaluation  Patient identified by MRN, date of birth, ID band Patient awake    Reviewed: Allergy & Precautions, NPO status , Patient's Chart, lab work & pertinent test results  Airway Mallampati: II  TM Distance: >3 FB Neck ROM: Full    Dental no notable dental hx. (+) Edentulous Upper, Edentulous Lower   Pulmonary neg pulmonary ROS,    Pulmonary exam normal breath sounds clear to auscultation       Cardiovascular hypertension, Pt. on medications Normal cardiovascular exam Rhythm:Regular Rate:Normal  10/21 TTE 1. Left ventricular ejection fraction, by estimation, is 60 to 65%. The  left ventricle has normal function. The left ventricle has no regional  wall motion abnormalities. Indeterminate diastolic filling due to E-A  fusion.  2. Right ventricular systolic function is normal. The right ventricular  size is normal. Tricuspid regurgitation signal is inadequate for assessing  PA pressure.  3. The mitral valve is normal in structure. No evidence of mitral valve  regurgitation. No evidence of mitral stenosis.  4. The aortic valve is tricuspid. There is moderate calcification of the  aortic valve. Aortic valve regurgitation is not visualized. Mild to  moderate aortic valve sclerosis/calcification is present, without any  evidence of aortic stenosis.  5. The inferior vena cava is normal in size with greater than 50%  respiratory variability, suggesting right atrial pressure of 3 mmHg.    Neuro/Psych CVA, No Residual Symptoms    GI/Hepatic negative GI ROS, Neg liver ROS,   Endo/Other  diabetes  Renal/GU CRF and ESRFRenal diseaseLab Results      Component                Value               Date                      CREATININE               5.20 (H)            06/11/2021                BUN                      36 (H)              06/11/2021                NA                       143                 06/11/2021                 K                        3.8                 06/11/2021                CL                       109                 06/11/2021                CO2  26                  05/04/2020                Musculoskeletal   Abdominal   Peds  Hematology  (+) anemia , Lab Results      Component                Value               Date                      WBC                      7.4                 05/04/2020                HGB                      9.5 (L)             06/11/2021                HCT                      28.0 (L)            06/11/2021                MCV                      100.4 (H)           05/04/2020                PLT                      332                 05/04/2020              Anesthesia Other Findings All: Furosemide  Reproductive/Obstetrics                            Anesthesia Physical Anesthesia Plan  ASA: 3  Anesthesia Plan: Regional   Post-op Pain Management:    Induction:   PONV Risk Score and Plan: Treatment may vary due to age or medical condition and Ondansetron  Airway Management Planned: Nasal Cannula and Natural Airway  Additional Equipment: None  Intra-op Plan:   Post-operative Plan:   Informed Consent: I have reviewed the patients History and Physical, chart, labs and discussed the procedure including the risks, benefits and alternatives for the proposed anesthesia with the patient or authorized representative who has indicated his/her understanding and acceptance.     Dental advisory given  Plan Discussed with:   Anesthesia Plan Comments: (L Supraclavicular)        Anesthesia Quick Evaluation

## 2021-06-11 NOTE — Anesthesia Procedure Notes (Signed)
Procedure Name: MAC Date/Time: 06/11/2021 3:25 PM Performed by: Inda Coke, CRNA Pre-anesthesia Checklist: Patient identified, Emergency Drugs available, Suction available, Timeout performed and Patient being monitored Patient Re-evaluated:Patient Re-evaluated prior to induction Oxygen Delivery Method: Simple face mask Induction Type: IV induction Dental Injury: Teeth and Oropharynx as per pre-operative assessment

## 2021-06-11 NOTE — Anesthesia Postprocedure Evaluation (Signed)
Anesthesia Post Note  Patient: [name]  Procedure(s) Performed: LEFT ARM BRACHIOCEPHALIC CREATION (Left)     Patient location during evaluation: PACU Anesthesia Type: Regional Level of consciousness: awake and alert Pain management: pain level controlled Vital Signs Assessment: post-procedure vital signs reviewed and stable Respiratory status: spontaneous breathing, nonlabored ventilation, respiratory function stable and patient connected to nasal cannula oxygen Cardiovascular status: stable and blood pressure returned to baseline Postop Assessment: no apparent nausea or vomiting Anesthetic complications: no   No notable events documented.  Last Vitals:  Vitals:   06/11/21 1655 06/11/21 1710  BP: (!) 143/87 (!) 160/90  Pulse: 78 81  Resp: 15 16  Temp: 36.6 C   SpO2: 98% 95%    Last Pain:  Vitals:   06/11/21 1655  PainSc: 0-No pain                 Barnet Glasgow

## 2021-06-11 NOTE — Discharge Instructions (Signed)
[facility]  Discharge Instructions  AV Fistula or Graft Surgery for Dialysis Access  Please refer to the following instructions for your post-procedure care. Your surgeon or physician assistant will discuss any changes with you.  Activity  You may drive the day following your surgery, if you are comfortable and no longer taking prescription pain medication. Resume full activity as the soreness in your incision resolves.  Bathing/Showering  You may shower after you go home. Keep your incision dry for 48 hours. Do not soak in a bathtub, hot tub, or swim until the incision heals completely. You may not shower if you have a hemodialysis catheter.  Incision Care  Clean your incision with mild soap and water after 48 hours. Pat the area dry with a clean towel. You do not need a bandage unless otherwise instructed. Do not apply any ointments or creams to your incision. You may have skin glue on your incision. Do not peel it off. It will come off on its own in about one week. Your arm may swell a bit after surgery. To reduce swelling use pillows to elevate your arm so it is above your heart. Your doctor will tell you if you need to lightly wrap your arm with an ACE bandage.  Diet  Resume your normal diet. There are not special food restrictions following this procedure. In order to heal from your surgery, it is CRITICAL to get adequate nutrition. Your body requires vitamins, minerals, and protein. Vegetables are the best source of vitamins and minerals. Vegetables also provide the perfect balance of protein. Processed food has little nutritional value, so try to avoid this.  Medications  Resume taking all of your medications. If your incision is causing pain, you may take over-the counter pain relievers such as acetaminophen (Tylenol). If you were prescribed a stronger pain medication, please be aware these medications can cause nausea and constipation. Prevent  nausea by taking the medication with a snack or meal. Avoid constipation by drinking plenty of fluids and eating foods with high amount of fiber, such as fruits, vegetables, and grains.   Do not take Tylenol if you are taking prescription pain medications.  You may Restart your Plavix on 06/13/2021  Follow up Your surgeon may want to see you in the office following your access surgery. If so, this will be arranged at the time of your surgery.  Please call us immediately for any of the following conditions:  Increased pain, redness, drainage (pus) from your incision site Fever of 101 degrees or higher Severe or worsening pain at your incision site Hand pain or numbness.  Reduce your risk of vascular disease:  Stop smoking. If you would like help, call [facility] at 1-800-QUIT-NOW 575-716-6557) or [place] [facility] [place] your cholesterol Maintain a desired weight Control your diabetes Keep your blood pressure down  Dialysis  It will take several weeks to several months for your new dialysis access to be ready for use. Your surgeon will determine when it is okay to use it. Your nephrologist will continue to direct your dialysis. You can continue to use your Permcath until your new access is ready for use.   06/11/2021 [name] [id number] 12-08-1940  Surgeon(s): Broadus John, MD  Procedure(s): LEFT ARM BRACHIOCEPHALIC CREATION   May stick graft immediately   May stick graft on designated area only:   X Do not stick left AV fistula for 12 weeks    If you have  any questions, please call the office at [phone].

## 2021-06-11 NOTE — Transfer of Care (Signed)
Immediate Anesthesia Transfer of Care Note  Patient: [name]  Procedure(s) Performed: LEFT ARM BRACHIOCEPHALIC CREATION (Left)  Patient Location: PACU  Anesthesia Type:MAC combined with regional for post-op pain  Level of Consciousness: awake, alert  and oriented  Airway & Oxygen Therapy: Patient Spontanous Breathing  Post-op Assessment: Report given to RN and Post -op Vital signs reviewed and stable  Post vital signs: Reviewed and stable  Last Vitals:  Vitals Value Taken Time  BP    Temp    Pulse    Resp    SpO2      Last Pain:  Vitals:   06/11/21 1415  PainSc: 0-No pain      Patients Stated Pain Goal: 3 (83/81/84 0375)  Complications: No notable events documented.

## 2021-06-11 NOTE — Anesthesia Procedure Notes (Addendum)
Anesthesia Regional Block: Supraclavicular block   Pre-Anesthetic Checklist: , timeout performed,  Correct Patient, Correct Site, Correct Laterality,  Correct Procedure, Correct Position, site marked,  Risks and benefits discussed,  Surgical consent,  Pre-op evaluation,  At surgeon's request and post-op pain management  Laterality: Left and Upper  Prep: chloraprep       Needles:  Injection technique: Single-shot  Needle Type: Echogenic Needle     Needle Length: 5cm  Needle Gauge: 21     Additional Needles:   Procedures:,,,, ultrasound used (permanent image in chart),,    Narrative:  Start time: 06/11/2021 2:00 PM End time: 06/11/2021 2:06 PM Injection made incrementally with aspirations every 5 mL.  Performed by: Personally  Anesthesiologist: Barnet Glasgow, MD

## 2021-06-11 NOTE — H&P (Signed)
Patient seen and examined in preoperative holding prior to left-sided brachiocephalic fistula creation. No changes in medical history, no changes in physical exam since last seen After discussing the risks and benefits of left-sided brachiocephalic fistula for permanent HD access, [name] elected to proceed.  Makayla John MD    CC:  ESRD Requesting Provider:  No ref. provider found  HPI: [name] is a Right handed 80 y.o. (December 28, 1940) female with kidney disease who presents at the request of No ref. provider found for permanent HD access.  Patient was hospitalized earlier this month with acute encephalopathy, SIRS, acute kidney injury on chronic kidney disease stage IV secondary to combined volume depletion and possible ATN, cholelithiasis with pericholecystic fluid.  She was discharged to SNF.  [name] was accompanied by her daughter-in-law [name] during her visit today.  She was in good spirits, with no complaints.  [name] is aware of her chronic kidney disease, as well as the need for dialysis.  Her mother was on dialysis years ago prior to passing away.  The etiology of her kidney disease is believed to be from side effects of chemotherapeutic drugs used during her bout with breast cancer.  The patient has had no prior access procedures. No current HD lines  The pt is  on a statin for cholesterol management.  The pt is not on a daily aspirin.   Other AC:  plavix The pt is  on medications for hypertension.   The pt is  diabetic. Tobacco hx:  never  Past Medical History:  Diagnosis Date   Anxiety    Back ache    Breast cancer ([place])    right   Bronchitis    Cancer of colon ([place])    Carotid arterial disease ([place])    a. 02/2018 Carotid U/S: 1-39% bilat ICA stenosis.   Chronic Dyspnea on exertion    a. 08/2017 MV: EF 62%, mid ant/apical ant defect. No ischemia-->low risk; b. 02/2018 Echo: EF 60-65%, no rwma, Gr1 DD. Mild AI. Asc Ao 75mm. Triv MR/TR/PR. Nl RV fxn.   CKD (chronic  kidney disease), stage IV ([place]) 2017   Depression    DM (diabetes mellitus) ([place])    Type II   Family history of primary TB    Fracture of ankle, closed 2014   left   History of chicken pox    History of hysterectomy 04/1980   History of primary TB    Hyperlipidemia    Hypertension    Knee pain, right    Lung abnormality    age 41 patient had pna spot on lungs    Microalbuminuria    Muscle cramps    both legs   Muscular degeneration    of right eye   Nephrolithiasis    Palpitations    a. 2007 s/p catheter ablation for tachycardia, though pt is not aware of details.   Personal history of radiation therapy    Stroke ([place])    a. 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 Chronic microvascular ischemia.    Past Surgical History:  Procedure Laterality Date   ABLATION  2007   fast heart rate   ANKLE FRACTURE SURGERY Left 2014   BREAST LUMPECTOMY Right    CATARACT EXTRACTION Right 2017   colonscopy  2015   HYSTEROTOMY     45 years ago   TONSILLECTOMY AND ADENOIDECTOMY     age 16    Social History   Socioeconomic History   Marital  status: Divorced    Spouse name: Not on file   Number of children: 2   Years of education: Not on file   Highest education level: Not on file  Occupational History   Not on file  Tobacco Use   Smoking status: Never   Smokeless tobacco: Never  Vaping Use   Vaping Use: Never used  Substance and Sexual Activity   Alcohol use: Yes    Comment: occ   Drug use: No   Sexual activity: Not Currently  Other Topics Concern   Not on file  Social History Narrative   Not on file   Social Determinants of Health   Financial Resource Strain: Not on file  Food Insecurity: Not on file  Transportation Needs: Not on file  Physical Activity: Not on file  Stress: Not on file  Social Connections: Not on file  Intimate Partner Violence: Not on file    Family History  Problem Relation Age of Onset   Peripheral Artery Disease Mother  26       HX OF POOR CIRCULATION, SMOKING, LEG AMPUTATION   Cancer Father        cancer of spine   Asthma Brother 39   Kidney Stones Brother    Breast cancer Neg Hx    Colon cancer Neg Hx    Colon polyps Neg Hx     Current Facility-Administered Medications  Medication Dose Route Frequency Provider Last Rate Last Admin   0.9 %  sodium chloride infusion   Intravenous Continuous Makayla John, MD 10 mL/hr at 06/11/21 1255 New Bag at 06/11/21 1255   ceFAZolin (ANCEF) IVPB 2g/100 mL premix  2 g Intravenous 30 min Pre-Op Makayla John, MD       chlorhexidine (HIBICLENS) 4 % liquid 4 application  60 mL Topical Once Makayla John, MD       And   [START ON 06/12/2021] chlorhexidine (HIBICLENS) 4 % liquid 4 application  60 mL Topical Once Makayla John, MD        Allergies  Allergen Reactions   Lasix [Furosemide] Other (See Comments)     REVIEW OF SYSTEMS:   [X]  denotes positive finding, [ ]  denotes negative finding Cardiac  Comments:  Chest pain or chest pressure:    Shortness of breath upon exertion:    Short of breath when lying flat:    Irregular heart rhythm:        Vascular    Pain in calf, thigh, or hip brought on by ambulation:    Pain in feet at night that wakes you up from your sleep:     Blood clot in your veins:    Leg swelling:         Pulmonary    Oxygen at home:    Productive cough:     Wheezing:         Neurologic    Sudden weakness in arms or legs:     Sudden numbness in arms or legs:     Sudden onset of difficulty speaking or slurred speech:    Temporary loss of vision in one eye:     Problems with dizziness:         Gastrointestinal    Blood in stool:     Vomited blood:         Genitourinary    Burning when urinating:     Blood in urine:        Psychiatric    Major  depression:         Hematologic    Bleeding problems:    Problems with blood clotting too easily:        Skin    Rashes or ulcers:        Constitutional    Fever  or chills:      PHYSICAL EXAMINATION:  Vitals:   06/10/21 1256 06/11/21 1208  BP:  (!) 189/72  Pulse:  90  Resp:  18  Temp:  98 F (36.7 C)  SpO2:  99%  Weight: 80 kg 83.9 kg  Height: 5\' 9"  (1.753 m) 5\' 7"  (1.702 m)    General:  WDWN in NAD; vital signs documented above Gait: Not observed HENT: WNL, normocephalic Pulmonary: normal non-labored breathing , without Rales, rhonchi,  wheezing Cardiac: regular HR,  Abdomen: soft, NT, no masses Skin: without rashes Vascular Exam/Pulses:  Right Left  Radial 2+ (normal) 2+ (normal)  Ulnar 2+ (normal) 2+ (normal)  Femoral    Popliteal    DP 2+ (normal) 2+ (normal)  PT     Extremities: without ischemic changes, without Gangrene , without cellulitis; without open wounds;  Musculoskeletal: no muscle wasting or atrophy  Neurologic: A&O X 3;  No focal weakness or paresthesias are detected Psychiatric:  The pt has Normal affect.   Non-Invasive Vascular Imaging:    +-----------------+-------------+----------+---------+  Right Cephalic   Diameter (cm)Depth (cm)Findings   +-----------------+-------------+----------+---------+  Shoulder             0.29        1.15              +-----------------+-------------+----------+---------+  Prox upper arm       0.30        0.80              +-----------------+-------------+----------+---------+  Mid upper arm        0.41        0.65              +-----------------+-------------+----------+---------+  Dist upper arm       0.29        0.66              +-----------------+-------------+----------+---------+  Antecubital fossa    0.23        0.53   branching  +-----------------+-------------+----------+---------+  Prox forearm         0.28        0.79   branching  +-----------------+-------------+----------+---------+  Mid forearm          0.29        0.56              +-----------------+-------------+----------+---------+  Dist forearm         0.26         0.37   branching  +-----------------+-------------+----------+---------+  Wrist                0.24        0.23              +-----------------+-------------+----------+---------+   +-----------------+-------------+----------+---------+  Right Basilic    Diameter (cm)Depth (cm)Findings   +-----------------+-------------+----------+---------+  Prox upper arm       0.32        1.22              +-----------------+-------------+----------+---------+  Mid upper arm        0.33        1.55              +-----------------+-------------+----------+---------+  Dist upper arm       0.37        1.58              +-----------------+-------------+----------+---------+  Antecubital fossa    0.37        1.56              +-----------------+-------------+----------+---------+  Prox forearm         0.20        0.51   branching  +-----------------+-------------+----------+---------+  Mid forearm          0.16        0.27              +-----------------+-------------+----------+---------+  Distal forearm       0.14        0.24              +-----------------+-------------+----------+---------+  Wrist                0.14        0.23              +-----------------+-------------+----------+---------+   +-----------------+-------------+----------+---------+  Left Cephalic    Diameter (cm)Depth (cm)Findings   +-----------------+-------------+----------+---------+  Shoulder             0.32        1.39              +-----------------+-------------+----------+---------+  Prox upper arm       0.36        1.30              +-----------------+-------------+----------+---------+  Mid upper arm        0.32        0.90              +-----------------+-------------+----------+---------+  Dist upper arm       0.31        0.62              +-----------------+-------------+----------+---------+  Antecubital fossa    0.33         0.36              +-----------------+-------------+----------+---------+  Prox forearm         0.26        0.79              +-----------------+-------------+----------+---------+  Mid forearm          0.27        0.58   branching  +-----------------+-------------+----------+---------+  Dist forearm         0.21        0.44              +-----------------+-------------+----------+---------+  Wrist                0.24        0.38              +-----------------+-------------+----------+---------+   +-----------------+-------------+----------+---------+  Left Basilic     Diameter (cm)Depth (cm)Findings   +-----------------+-------------+----------+---------+  Prox upper arm       0.51        1.35              +-----------------+-------------+----------+---------+  Mid upper arm        0.47        1.46              +-----------------+-------------+----------+---------+  Dist upper arm  0.41        1.60              +-----------------+-------------+----------+---------+  Antecubital fossa    0.37        1.24              +-----------------+-------------+----------+---------+  Prox forearm         0.18        0.65   branching  +-----------------+-------------+----------+---------+  Mid forearm          0.16        0.54              +-----------------+-------------+----------+---------+  Distal forearm       0.16        0.25              +-----------------+-------------+----------+---------+  Wrist                0.15        0.26   branching  +-----------------+-------------+----------+---------+   Noninvasive arterial and venous duplex ultrasonography of the bilateral upper extremities were independently reviewed demonstrating adequate diameter cephalic and basilic veins bilaterally, 3+ millimeter brachial artery.  ASSESSMENT/PLAN:  Nishika Parkhurst is a 80 y.o. female who presents with chronic kidney disease stage  4  Based on vein mapping and examination the patient has multiple upper extremity veins that could be used for fistula creation. Being that the patient is right-handed, and has right-sided breast cancer, I discussed left-sided brachiocephalic fistula creation with her. I had an extensive discussion with this patient in regards to the nature of access surgery, including risk, benefits, and alternatives.   The patient is aware that the risks of access surgery include but are not limited to: bleeding, infection, steal syndrome, nerve damage, ischemic monomelic neuropathy, failure of access to mature, complications related to venous hypertension, and possible need for additional access procedures in the future. The patient has  agreed to proceed with the above procedure which will be scheduled.  Makayla John, MD [facility] [phone]

## 2021-06-11 NOTE — Op Note (Signed)
    NAME: [name]    MRN: [id number] DOB: [birth year]    DATE OF OPERATION: 06/11/2021  PREOP DIAGNOSIS:    CKD stage 4  POSTOP DIAGNOSIS:    CKD stage 4  PROCEDURE:    Left brachiocephalic fistula  SURGEON: Broadus John  ASSIST: Paulo Fruit  ANESTHESIA: moderate   EBL: 57ml  INDICATIONS:    [name] is a 79 y.o. female who presents with chronic kidney disease stage 4. Based on vein mapping and examination, the patient has multiple upper extremity veins that could be used for fistula creation. Being that the patient is right-handed, and has right-sided breast cancer, I discussed left-sided brachiocephalic fistula creation with her. After discussing the risks and benefits, [name] elected to proceed.   FINDINGS:   5mm sclerotic cephalic vein 73mm Brachial artery  TECHNIQUE:   A left upper extremity block was performed by the anesthesia team. The patient was brought to the operating room and placed in supine position. The left arm was prepped and draped in standard fashion. IV antibiotics were administered. A timeout was performed.   The case began with ultrasound insonation of the brachial artery and cephalic vein, which demonstrated sufficient size at the antecubital fossa for arteriovenous fistula.   A transverse incision was made below the elbow creese in the antecubital fossa. The cephalic vein was isolated for 3 cm in length including the medial cubital vein. Next the forearm aponeurosis was partially released and the brachial artery secured with a vessel loop. The patient was heparinized. The median cubital vein was transected where it branched and ligated distally with a 2-0 silk stick-tie. The vein was dilated with coronary dilators and flushed with heparin saline. Vascular clamps were placed proximally and distally on the brachial artery and a 5 mm arteriotomy was created on the brachial artery. This was flushed with heparin saline.  An anastomosis was  created in end to side fashion on the brachial artery using running 6-0 Prolene suture.  Prior to completing the anastomosis, the vessels were flushed and the suture line was tied down. There was an excellent thrill in the cephalic vein from the anastomosis to the mid upper bicipital region. The patient had a multiphasic radial and ulnar signal. He had an excellent doppler signal in the fistula. The incision was irrigated and hemostasis achieved with cautery and suture. The deeper tissue was closed with 3-0 Vicryl and the skin closed with 4-0 Monocryl.  Dermabond was applied to the incisions. The patient was transferred to PACU in stable condition.   Given the complexity of the case a first assistant was necessary in order to expedient the procedure and safely perform the technical aspects of the operation.  Macie Burows, MD [facility]  DATE OF DICTATION:   06/11/2021

## 2021-06-12 ENCOUNTER — Encounter (HOSPITAL_COMMUNITY): Payer: Self-pay | Admitting: Vascular Surgery

## 2021-07-10 ENCOUNTER — Other Ambulatory Visit: Payer: Self-pay

## 2021-07-10 DIAGNOSIS — N184 Chronic kidney disease, stage 4 (severe): Secondary | ICD-10-CM

## 2021-07-24 ENCOUNTER — Ambulatory Visit (INDEPENDENT_AMBULATORY_CARE_PROVIDER_SITE_OTHER): Payer: Medicare Other | Admitting: Physician Assistant

## 2021-07-24 ENCOUNTER — Other Ambulatory Visit: Payer: Self-pay

## 2021-07-24 ENCOUNTER — Ambulatory Visit (HOSPITAL_COMMUNITY)
Admission: RE | Admit: 2021-07-24 | Discharge: 2021-07-24 | Disposition: A | Discharge status: Home / Self Care | Payer: Medicare Other | Source: Ambulatory Visit | Attending: Physician Assistant | Admitting: Physician Assistant

## 2021-07-24 VITALS — BP 229/107 | HR 108 | Temp 97.7°F | Resp 20 | Ht 67.0 in | Wt 181.6 lb

## 2021-07-24 DIAGNOSIS — N184 Chronic kidney disease, stage 4 (severe): Secondary | ICD-10-CM | POA: Insufficient documentation

## 2021-07-24 DIAGNOSIS — R112 Nausea with vomiting, unspecified: Secondary | ICD-10-CM | POA: Insufficient documentation

## 2021-07-24 DIAGNOSIS — R0902 Hypoxemia: Secondary | ICD-10-CM | POA: Insufficient documentation

## 2021-07-24 DIAGNOSIS — M5430 Sciatica, unspecified side: Secondary | ICD-10-CM | POA: Insufficient documentation

## 2021-07-24 NOTE — Progress Notes (Signed)
POST OPERATIVE OFFICE NOTE    CC:  F/u for surgery  HPI:  This is a 80 y.o. female who is s/p left brachiocephalic fistula on 21/19/41 for CKD by Dr. [name].    Pt returns today for follow up.  Pt states she is being followed by Dr. Moshe Cipro and is not requiring HD at this time.  She denise symptoms of fever or chills, no excessive edema or SOB.  She denise symptoms of steal as well in the left UE.    Allergies  Allergen Reactions   Lasix [Furosemide] Other (See Comments)    Current Outpatient Medications  Medication Sig Dispense Refill   acetaminophen (TYLENOL) 325 MG tablet Take 2 tablets (650 mg total) by mouth every 4 (four) hours as needed for mild pain (or temp > 37.5 C (99.5 F)).     ARIPiprazole (ABILIFY) 5 MG tablet Take 1 tablet (5 mg total) by mouth daily. 30 tablet 3   atorvastatin (LIPITOR) 80 MG tablet Take 1 tablet (80 mg total) by mouth daily at 6 PM. (Patient taking differently: Take 80 mg by mouth in the morning.) 30 tablet 0   BD PEN NEEDLE NANO 2ND GEN 32G X 4 MM MISC 1 each by Other route See admin instructions. Use with [place] daily.     beta carotene w/minerals (OCUVITE) tablet Take 1 tablet by mouth in the morning.     CALCIUM PO Take 1 tablet by mouth in the morning.     Cholecalciferol (QC VITAMIN D3) 50 MCG (2000 UT) TABS Take 2,000 Units by mouth in the morning.     clopidogrel (PLAVIX) 75 MG tablet Take 1 tablet (75 mg total) by mouth daily. Restart on 06/13/21 30 tablet 0   HUMALOG MIX 75/25 KWIKPEN (75-25) 100 UNIT/ML KwikPen Inject 44 Units into the skin in the morning and at bedtime.     hydrALAZINE (APRESOLINE) 25 MG tablet Take 25 mg by mouth 3 (three) times daily.     LORazepam (ATIVAN) 0.5 MG tablet Take 1 tablet (0.5 mg total) by mouth every 6 (six) hours as needed for anxiety. 14 tablet 0   metoprolol succinate (TOPROL-XL) 25 MG 24 hr tablet Take 25 mg by mouth at bedtime.     mirtazapine (REMERON) 15 MG tablet Take 1 tablet (15 mg total) by  mouth at bedtime. 30 tablet 1   Multiple Vitamin (MULTIVITAMIN WITH MINERALS) TABS tablet Take 1 tablet by mouth in the morning. Centrum Silver Women's Multivitamin     omega-3 acid ethyl esters (LOVAZA) 1 g capsule Take 2 g by mouth 2 (two) times daily.     ONETOUCH VERIO test strip 1 each by Other route See admin instructions. Use 1 strip to test blood glucose twice daily.     senna (SENOKOT) 8.6 MG tablet Take 1 tablet by mouth at bedtime.     traMADol (ULTRAM) 50 MG tablet Take 1 tablet (50 mg total) by mouth every 6 (six) hours as needed. 8 tablet 0   venlafaxine XR (EFFEXOR-XR) 150 MG 24 hr capsule Take 150 mg by mouth in the morning.  3   No current facility-administered medications for this visit.     ROS:  See HPI  Physical Exam:      Findings:  +--------------------+----------+-----------------+--------+   AVF                  PSV (cm/s) Flow Vol (mL/min) Comments   +--------------------+----------+-----------------+--------+   Native artery inflow    212  900                   +--------------------+----------+-----------------+--------+   AVF Anastomosis         593                                  +--------------------+----------+-----------------+--------+      +------------+----------+-------------+----------+--------+   OUTFLOW VEIN PSV (cm/s) Diameter (cm) Depth (cm) Describe   +------------+----------+-------------+----------+--------+   Prox UA         117         0.58         0.14               +------------+----------+-------------+----------+--------+   Mid UA          140         0.59         0.84               +------------+----------+-------------+----------+--------+   Dist UA         169         0.66         0.61               +------------+----------+-------------+----------+--------+   AC Fossa        537         0.54         0.46               +------------+----------+-------------+----------+--------+          Summary:  Patent AVF.   No significant stenosis.  No significant branching  Incision:  Well healed incision left AC area UE Extremities:  grip 5/5 equal B, palpable radial pulse and thrill in the distal 1/3 of the fistula.  The mid and proximal section are weakly palpable thrill due to depth and size. Neuro: sensation intact and equal B UE Lungs:  non labored breathing   Assessment/Plan:  This is a 80 y.o. female who is s/p:Left AV BC fistula creation by Dr. [name] 06/11/21  The fistula diameter is maturing well, however the deppth is> 0.6 cm.  I gave her an exercise ball and will have her return in 4 weeks for repeat duplex.  She is not requiring HD.  If the fistula does not meet use standards of size> 0.6 and depth < 0.6 on repeat duplex we will schedule her for superficialization of the fistula.  F/U in 4 weeks for repeat duplex.  She understands that if she needs HD emergently she will need a TDC.     Roxy Horseman PA-C [facility] [phone]   Clinic MD:  [name]

## 2021-07-28 ENCOUNTER — Other Ambulatory Visit: Payer: Self-pay

## 2021-07-28 DIAGNOSIS — N184 Chronic kidney disease, stage 4 (severe): Secondary | ICD-10-CM

## 2021-08-19 ENCOUNTER — Other Ambulatory Visit (HOSPITAL_COMMUNITY): Payer: Self-pay

## 2021-08-20 ENCOUNTER — Encounter (HOSPITAL_COMMUNITY)
Admission: RE | Admit: 2021-08-20 | Discharge: 2021-08-20 | Disposition: A | Discharge status: Home / Self Care | Payer: Medicare HMO | Source: Ambulatory Visit | Attending: Nephrology | Admitting: Nephrology

## 2021-08-20 DIAGNOSIS — N189 Chronic kidney disease, unspecified: Secondary | ICD-10-CM | POA: Insufficient documentation

## 2021-08-20 DIAGNOSIS — D631 Anemia in chronic kidney disease: Secondary | ICD-10-CM | POA: Diagnosis not present

## 2021-08-20 MED ORDER — SODIUM CHLORIDE 0.9 % IV SOLN
510.0000 mg | INTRAVENOUS | Status: DC
  Administered 2021-08-20: 510 mg via INTRAVENOUS
  Filled 2021-08-20: qty 510

## 2021-08-21 ENCOUNTER — Ambulatory Visit: Payer: Medicare HMO | Admitting: Physician Assistant

## 2021-08-21 ENCOUNTER — Other Ambulatory Visit: Payer: Self-pay

## 2021-08-21 ENCOUNTER — Ambulatory Visit (HOSPITAL_COMMUNITY)
Admission: RE | Admit: 2021-08-21 | Discharge: 2021-08-21 | Disposition: A | Discharge status: Home / Self Care | Payer: Medicare HMO | Source: Ambulatory Visit | Attending: Vascular Surgery | Admitting: Vascular Surgery

## 2021-08-21 ENCOUNTER — Encounter: Payer: Self-pay | Admitting: Physician Assistant

## 2021-08-21 VITALS — BP 183/87 | HR 87 | Temp 97.7°F | Resp 14 | Ht 67.0 in | Wt 180.0 lb

## 2021-08-21 DIAGNOSIS — N184 Chronic kidney disease, stage 4 (severe): Secondary | ICD-10-CM

## 2021-08-21 NOTE — Progress Notes (Signed)
POST OPERATIVE OFFICE NOTE    CC:  F/u for surgery  HPI:  This is a 81 y.o. female who is s/p left brachiocephalic fistula on 56/25/63 for CKD by Dr. Virl Knapp.    Pt returns today for follow up.  Pt states she has been doing her exercises with the ball to improve the maturity of the fistula.  She is not currently on HD and is followed OP by Dr. Moshe Cipro.  She denies symptoms of steal.  No loss of motor, sensation or pin in the left UE.   Allergies  Allergen Reactions   Lasix [Furosemide] Other (See Comments)   Lisinopril     Other reaction(s): worsening renal function   Other     Other reaction(s): bowels loose    Current Outpatient Medications  Medication Sig Dispense Refill   acetaminophen (TYLENOL) 325 MG tablet Take 2 tablets (650 mg total) by mouth every 4 (four) hours as needed for mild pain (or temp > 37.5 C (99.5 F)).     ARIPiprazole (ABILIFY) 5 MG tablet Take 1 tablet (5 mg total) by mouth daily. 30 tablet 3   atorvastatin (LIPITOR) 80 MG tablet Take 1 tablet (80 mg total) by mouth daily at 6 PM. (Patient taking differently: Take 80 mg by mouth in the morning.) 30 tablet 0   BD PEN NEEDLE NANO 2ND GEN 32G X 4 MM MISC 1 each by Other route See admin instructions. Use with [place] daily.     beta carotene w/minerals (OCUVITE) tablet Take 1 tablet by mouth in the morning.     CALCIUM PO Take 1 tablet by mouth in the morning.     Cholecalciferol (QC VITAMIN D3) 50 MCG (2000 UT) TABS Take 2,000 Units by mouth in the morning.     clopidogrel (PLAVIX) 75 MG tablet Take 1 tablet (75 mg total) by mouth daily. Restart on 06/13/21 30 tablet 0   HUMALOG MIX 75/25 KWIKPEN (75-25) 100 UNIT/ML KwikPen Inject 44 Units into the skin in the morning and at bedtime.     hydrALAZINE (APRESOLINE) 25 MG tablet Take 25 mg by mouth 3 (three) times daily.     LORazepam (ATIVAN) 0.5 MG tablet Take 1 tablet (0.5 mg total) by mouth every 6 (six) hours as needed for anxiety. 14 tablet 0   metoprolol  succinate (TOPROL-XL) 25 MG 24 hr tablet Take 25 mg by mouth at bedtime.     mirtazapine (REMERON) 15 MG tablet Take 1 tablet (15 mg total) by mouth at bedtime. 30 tablet 1   Multiple Vitamin (MULTIVITAMIN WITH MINERALS) TABS tablet Take 1 tablet by mouth in the morning. Centrum Silver Women's Multivitamin     omega-3 acid ethyl esters (LOVAZA) 1 g capsule Take 2 g by mouth 2 (two) times daily.     ONETOUCH VERIO test strip 1 each by Other route See admin instructions. Use 1 strip to test blood glucose twice daily.     senna (SENOKOT) 8.6 MG tablet Take 1 tablet by mouth at bedtime.     traMADol (ULTRAM) 50 MG tablet Take 1 tablet (50 mg total) by mouth every 6 (six) hours as needed. 8 tablet 0   venlafaxine XR (EFFEXOR-XR) 150 MG 24 hr capsule Take 150 mg by mouth in the morning.  3   No current facility-administered medications for this visit.     ROS:  See HPI  Physical Exam:     Findings:  +--------------------+----------+-----------------+--------+   AVF  PSV (cm/s) Flow Vol (mL/min) Comments   +--------------------+----------+-----------------+--------+   Native artery inflow    354           1185                   +--------------------+----------+-----------------+--------+   AVF Anastomosis         874                                  +--------------------+----------+-----------------+--------+      +------------+----------+-------------+----------+--------+   OUTFLOW VEIN PSV (cm/s) Diameter (cm) Depth (cm) Describe   +------------+----------+-------------+----------+--------+   Prox UA         165         0.52         1.44               +------------+----------+-------------+----------+--------+   Mid UA          211         0.68         0.55               +------------+----------+-------------+----------+--------+   Dist UA         134         0.71         0.33               +------------+----------+-------------+----------+--------+   AC Fossa         202         0.83         0.16               +------------+----------+-------------+----------+--------+   Incision:   incision well healed Extremities:  Grip 5/5, sensation intact equal B UE.  Palpable radial pulse. She has a good thrill in the fistula.  It is palpable throughout its course.   Lungs: non labored breathing Abdomen:  soft NTTP   Assessment/Plan:  This is a 81 y.o. female who is s/p:left brachiocephalic fistula on 29/56/21 for CKD by Dr. Virl Knapp.    The fistula was slow to mature.  On he last visit I asked her to exercise and use her left UE for daily activities to help the fistula mature.   The repeat fistula duplex demonstrates improved diameter of the fistula > 0.6 cm and the depth < 0.6 cm.    She is not requiring HD at this time.  She is followed by Dr. Moshe [facility] at [facility].     At this time the fistula maybe accessed for HD if needed as of 10/09/21.  Continue to use the left UE and check daily for thrill.  F/U as needed in the future.   Roxy Horseman PA-C [facility] [phone]   Clinic MD:  [name]

## 2021-08-27 ENCOUNTER — Encounter (HOSPITAL_COMMUNITY)
Admission: RE | Admit: 2021-08-27 | Discharge: 2021-08-27 | Disposition: A | Discharge status: Home / Self Care | Payer: Medicare HMO | Source: Ambulatory Visit | Attending: Nephrology | Admitting: Nephrology

## 2021-08-27 DIAGNOSIS — D631 Anemia in chronic kidney disease: Secondary | ICD-10-CM | POA: Insufficient documentation

## 2021-08-27 DIAGNOSIS — N189 Chronic kidney disease, unspecified: Secondary | ICD-10-CM | POA: Diagnosis present

## 2021-08-27 MED ORDER — SODIUM CHLORIDE 0.9 % IV SOLN
510.0000 mg | INTRAVENOUS | Status: AC
  Administered 2021-08-27: 510 mg via INTRAVENOUS
  Filled 2021-08-27: qty 510

## 2021-10-20 ENCOUNTER — Emergency Department (HOSPITAL_COMMUNITY): Payer: Medicare HMO

## 2021-10-20 ENCOUNTER — Inpatient Hospital Stay (HOSPITAL_COMMUNITY): Payer: Medicare HMO

## 2021-10-20 ENCOUNTER — Encounter (HOSPITAL_COMMUNITY): Payer: Self-pay | Admitting: Emergency Medicine

## 2021-10-20 ENCOUNTER — Other Ambulatory Visit: Payer: Self-pay

## 2021-10-20 ENCOUNTER — Inpatient Hospital Stay (HOSPITAL_COMMUNITY)
Admission: EM | Admit: 2021-10-20 | Discharge: 2021-10-24 | DRG: 682 | Disposition: E | Payer: Medicare HMO | Attending: Internal Medicine | Admitting: Internal Medicine

## 2021-10-20 DIAGNOSIS — Z888 Allergy status to other drugs, medicaments and biological substances status: Secondary | ICD-10-CM

## 2021-10-20 DIAGNOSIS — Z20822 Contact with and (suspected) exposure to covid-19: Secondary | ICD-10-CM | POA: Diagnosis present

## 2021-10-20 DIAGNOSIS — N184 Chronic kidney disease, stage 4 (severe): Secondary | ICD-10-CM | POA: Diagnosis present

## 2021-10-20 DIAGNOSIS — D631 Anemia in chronic kidney disease: Secondary | ICD-10-CM | POA: Diagnosis present

## 2021-10-20 DIAGNOSIS — Z923 Personal history of irradiation: Secondary | ICD-10-CM

## 2021-10-20 DIAGNOSIS — R29726 NIHSS score 26: Secondary | ICD-10-CM | POA: Diagnosis present

## 2021-10-20 DIAGNOSIS — E1122 Type 2 diabetes mellitus with diabetic chronic kidney disease: Secondary | ICD-10-CM | POA: Diagnosis present

## 2021-10-20 DIAGNOSIS — J9601 Acute respiratory failure with hypoxia: Secondary | ICD-10-CM | POA: Diagnosis present

## 2021-10-20 DIAGNOSIS — I6389 Other cerebral infarction: Secondary | ICD-10-CM | POA: Diagnosis present

## 2021-10-20 DIAGNOSIS — Z8673 Personal history of transient ischemic attack (TIA), and cerebral infarction without residual deficits: Secondary | ICD-10-CM | POA: Diagnosis not present

## 2021-10-20 DIAGNOSIS — H3530 Unspecified macular degeneration: Secondary | ICD-10-CM | POA: Diagnosis present

## 2021-10-20 DIAGNOSIS — Z853 Personal history of malignant neoplasm of breast: Secondary | ICD-10-CM | POA: Diagnosis not present

## 2021-10-20 DIAGNOSIS — F32A Depression, unspecified: Secondary | ICD-10-CM | POA: Diagnosis present

## 2021-10-20 DIAGNOSIS — G9341 Metabolic encephalopathy: Secondary | ICD-10-CM | POA: Diagnosis present

## 2021-10-20 DIAGNOSIS — B961 Klebsiella pneumoniae [K. pneumoniae] as the cause of diseases classified elsewhere: Secondary | ICD-10-CM | POA: Diagnosis present

## 2021-10-20 DIAGNOSIS — N170 Acute kidney failure with tubular necrosis: Secondary | ICD-10-CM

## 2021-10-20 DIAGNOSIS — R569 Unspecified convulsions: Secondary | ICD-10-CM | POA: Diagnosis present

## 2021-10-20 DIAGNOSIS — G253 Myoclonus: Secondary | ICD-10-CM | POA: Diagnosis present

## 2021-10-20 DIAGNOSIS — Z809 Family history of malignant neoplasm, unspecified: Secondary | ICD-10-CM

## 2021-10-20 DIAGNOSIS — G931 Anoxic brain damage, not elsewhere classified: Secondary | ICD-10-CM | POA: Diagnosis present

## 2021-10-20 DIAGNOSIS — N390 Urinary tract infection, site not specified: Secondary | ICD-10-CM | POA: Diagnosis present

## 2021-10-20 DIAGNOSIS — I129 Hypertensive chronic kidney disease with stage 1 through stage 4 chronic kidney disease, or unspecified chronic kidney disease: Secondary | ICD-10-CM | POA: Diagnosis present

## 2021-10-20 DIAGNOSIS — G936 Cerebral edema: Secondary | ICD-10-CM | POA: Diagnosis present

## 2021-10-20 DIAGNOSIS — Z66 Do not resuscitate: Secondary | ICD-10-CM | POA: Diagnosis not present

## 2021-10-20 DIAGNOSIS — I2510 Atherosclerotic heart disease of native coronary artery without angina pectoris: Secondary | ICD-10-CM | POA: Diagnosis present

## 2021-10-20 DIAGNOSIS — E785 Hyperlipidemia, unspecified: Secondary | ICD-10-CM | POA: Diagnosis present

## 2021-10-20 DIAGNOSIS — Z85038 Personal history of other malignant neoplasm of large intestine: Secondary | ICD-10-CM

## 2021-10-20 DIAGNOSIS — Z7901 Long term (current) use of anticoagulants: Secondary | ICD-10-CM

## 2021-10-20 DIAGNOSIS — Z7902 Long term (current) use of antithrombotics/antiplatelets: Secondary | ICD-10-CM

## 2021-10-20 DIAGNOSIS — K922 Gastrointestinal hemorrhage, unspecified: Secondary | ICD-10-CM | POA: Diagnosis not present

## 2021-10-20 DIAGNOSIS — R579 Shock, unspecified: Secondary | ICD-10-CM | POA: Diagnosis present

## 2021-10-20 DIAGNOSIS — Z515 Encounter for palliative care: Secondary | ICD-10-CM | POA: Diagnosis not present

## 2021-10-20 DIAGNOSIS — Z8249 Family history of ischemic heart disease and other diseases of the circulatory system: Secondary | ICD-10-CM

## 2021-10-20 DIAGNOSIS — R680 Hypothermia, not associated with low environmental temperature: Secondary | ICD-10-CM | POA: Diagnosis present

## 2021-10-20 DIAGNOSIS — Z794 Long term (current) use of insulin: Secondary | ICD-10-CM

## 2021-10-20 DIAGNOSIS — R4189 Other symptoms and signs involving cognitive functions and awareness: Principal | ICD-10-CM

## 2021-10-20 DIAGNOSIS — Z79899 Other long term (current) drug therapy: Secondary | ICD-10-CM

## 2021-10-20 DIAGNOSIS — N179 Acute kidney failure, unspecified: Secondary | ICD-10-CM | POA: Diagnosis present

## 2021-10-20 DIAGNOSIS — D72829 Elevated white blood cell count, unspecified: Secondary | ICD-10-CM

## 2021-10-20 DIAGNOSIS — R0609 Other forms of dyspnea: Secondary | ICD-10-CM | POA: Diagnosis not present

## 2021-10-20 LAB — HEMOGLOBIN A1C
Hgb A1c MFr Bld: 5.7 % — ABNORMAL HIGH (ref 4.8–5.6)
Mean Plasma Glucose: 116.89 mg/dL

## 2021-10-20 LAB — URINALYSIS, ROUTINE W REFLEX MICROSCOPIC
Bilirubin Urine: NEGATIVE
Glucose, UA: NEGATIVE mg/dL
Ketones, ur: 5 mg/dL — AB
Nitrite: NEGATIVE
Protein, ur: 100 mg/dL — AB
Specific Gravity, Urine: 1.016 (ref 1.005–1.030)
WBC, UA: >50 WBC/hpf — ABNORMAL HIGH (ref 0–5)
pH: 5 (ref 5.0–8.0)

## 2021-10-20 LAB — COMPREHENSIVE METABOLIC PANEL
ALT: 65 U/L — ABNORMAL HIGH (ref 0–44)
AST: 100 U/L — ABNORMAL HIGH (ref 15–41)
Albumin: 2.3 g/dL — ABNORMAL LOW (ref 3.5–5.0)
Alkaline Phosphatase: 97 U/L (ref 38–126)
Anion gap: 24 — ABNORMAL HIGH (ref 5–15)
BUN: 150 mg/dL — ABNORMAL HIGH (ref 8–23)
CO2: 8 mmol/L — ABNORMAL LOW (ref 22–32)
Calcium: 8.2 mg/dL — ABNORMAL LOW (ref 8.9–10.3)
Chloride: 112 mmol/L — ABNORMAL HIGH (ref 98–111)
Creatinine, Ser: 9.46 mg/dL — ABNORMAL HIGH (ref 0.44–1.00)
GFR, Estimated: 4 mL/min — ABNORMAL LOW (ref >60)
Glucose, Bld: 177 mg/dL — ABNORMAL HIGH (ref 70–99)
Potassium: 5 mmol/L (ref 3.5–5.1)
Sodium: 144 mmol/L (ref 135–145)
Total Bilirubin: 1.5 mg/dL — ABNORMAL HIGH (ref 0.3–1.2)
Total Protein: 5.2 g/dL — ABNORMAL LOW (ref 6.5–8.1)

## 2021-10-20 LAB — I-STAT CHEM 8, ED
BUN: >130 mg/dL — ABNORMAL HIGH (ref 8–23)
Calcium, Ion: 1.03 mmol/L — ABNORMAL LOW (ref 1.15–1.40)
Chloride: 113 mmol/L — ABNORMAL HIGH (ref 98–111)
Creatinine, Ser: 10.5 mg/dL — ABNORMAL HIGH (ref 0.44–1.00)
Glucose, Bld: 175 mg/dL — ABNORMAL HIGH (ref 70–99)
HCT: 28 % — ABNORMAL LOW (ref 36.0–46.0)
Hemoglobin: 9.5 g/dL — ABNORMAL LOW (ref 12.0–15.0)
Potassium: 4.8 mmol/L (ref 3.5–5.1)
Sodium: 142 mmol/L (ref 135–145)
TCO2: 11 mmol/L — ABNORMAL LOW (ref 22–32)

## 2021-10-20 LAB — MAGNESIUM: Magnesium: 2.1 mg/dL (ref 1.7–2.4)

## 2021-10-20 LAB — GLUCOSE, CAPILLARY
Glucose-Capillary: 192 mg/dL — ABNORMAL HIGH (ref 70–99)
Glucose-Capillary: 187 mg/dL — ABNORMAL HIGH (ref 70–99)
Glucose-Capillary: 146 mg/dL — ABNORMAL HIGH (ref 70–99)

## 2021-10-20 LAB — POCT I-STAT 7, (LYTES, BLD GAS, ICA,H+H)
Acid-base deficit: 16 mmol/L — ABNORMAL HIGH (ref 0.0–2.0)
Bicarbonate: 10.2 mmol/L — ABNORMAL LOW (ref 20.0–28.0)
Calcium, Ion: 1.12 mmol/L — ABNORMAL LOW (ref 1.15–1.40)
HCT: 24 % — ABNORMAL LOW (ref 36.0–46.0)
Hemoglobin: 8.2 g/dL — ABNORMAL LOW (ref 12.0–15.0)
O2 Saturation: 78 %
Patient temperature: 37.1
Potassium: 4.8 mmol/L (ref 3.5–5.1)
Sodium: 142 mmol/L (ref 135–145)
TCO2: 11 mmol/L — ABNORMAL LOW (ref 22–32)
pCO2 arterial: 24.4 mmHg — ABNORMAL LOW (ref 32–48)
pH, Arterial: 7.229 — ABNORMAL LOW (ref 7.35–7.45)
pO2, Arterial: 49 mmHg — ABNORMAL LOW (ref 83–108)

## 2021-10-20 LAB — I-STAT ARTERIAL BLOOD GAS, ED
Acid-base deficit: 16 mmol/L — ABNORMAL HIGH (ref 0.0–2.0)
Bicarbonate: 11.8 mmol/L — ABNORMAL LOW (ref 20.0–28.0)
Calcium, Ion: 1.16 mmol/L (ref 1.15–1.40)
HCT: 23 % — ABNORMAL LOW (ref 36.0–46.0)
Hemoglobin: 7.8 g/dL — ABNORMAL LOW (ref 12.0–15.0)
O2 Saturation: 100 %
Potassium: 4.7 mmol/L (ref 3.5–5.1)
Sodium: 141 mmol/L (ref 135–145)
TCO2: 13 mmol/L — ABNORMAL LOW (ref 22–32)
pCO2 arterial: 34.6 mmHg (ref 32–48)
pH, Arterial: 7.142 — CL (ref 7.35–7.45)
pO2, Arterial: 560 mmHg — ABNORMAL HIGH (ref 83–108)

## 2021-10-20 LAB — RESP PANEL BY RT-PCR (FLU A&B, COVID) ARPGX2
Influenza A by PCR: NEGATIVE
Influenza B by PCR: NEGATIVE
SARS Coronavirus 2 by RT PCR: NEGATIVE

## 2021-10-20 LAB — RAPID URINE DRUG SCREEN, HOSP PERFORMED
Amphetamines: NOT DETECTED
Barbiturates: NOT DETECTED
Benzodiazepines: POSITIVE — AB
Cocaine: NOT DETECTED
Opiates: NOT DETECTED
Tetrahydrocannabinol: NOT DETECTED

## 2021-10-20 LAB — CBC
HCT: 27.4 % — ABNORMAL LOW (ref 36.0–46.0)
Hemoglobin: 8.8 g/dL — ABNORMAL LOW (ref 12.0–15.0)
MCH: 33.6 pg (ref 26.0–34.0)
MCHC: 32.1 g/dL (ref 30.0–36.0)
MCV: 104.6 fL — ABNORMAL HIGH (ref 80.0–100.0)
Platelets: 294 10*3/uL (ref 150–400)
RBC: 2.62 MIL/uL — ABNORMAL LOW (ref 3.87–5.11)
RDW: 13.3 % (ref 11.5–15.5)
WBC: 18.6 10*3/uL — ABNORMAL HIGH (ref 4.0–10.5)
nRBC: 0 % (ref 0.0–0.2)

## 2021-10-20 LAB — CBG MONITORING, ED: Glucose-Capillary: 108 mg/dL — ABNORMAL HIGH (ref 70–99)

## 2021-10-20 LAB — APTT: aPTT: 35 seconds (ref 24–36)

## 2021-10-20 LAB — DIFFERENTIAL
Abs Immature Granulocytes: 0.12 10*3/uL — ABNORMAL HIGH (ref 0.00–0.07)
Basophils Absolute: 0 10*3/uL (ref 0.0–0.1)
Basophils Relative: 0 %
Eosinophils Absolute: 0 10*3/uL (ref 0.0–0.5)
Eosinophils Relative: 0 %
Immature Granulocytes: 1 %
Lymphocytes Relative: 4 %
Lymphs Abs: 0.7 10*3/uL (ref 0.7–4.0)
Monocytes Absolute: 1.2 10*3/uL — ABNORMAL HIGH (ref 0.1–1.0)
Monocytes Relative: 7 %
Neutro Abs: 16.6 10*3/uL — ABNORMAL HIGH (ref 1.7–7.7)
Neutrophils Relative %: 88 %

## 2021-10-20 LAB — TYPE AND SCREEN
ABO/RH(D): O NEG
Antibody Screen: NEGATIVE

## 2021-10-20 LAB — TROPONIN I (HIGH SENSITIVITY)
Troponin I (High Sensitivity): 174 ng/L (ref <18)
Troponin I (High Sensitivity): 153 ng/L (ref <18)

## 2021-10-20 LAB — PROTIME-INR
INR: 1.5 — ABNORMAL HIGH (ref 0.8–1.2)
Prothrombin Time: 18.3 seconds — ABNORMAL HIGH (ref 11.4–15.2)

## 2021-10-20 LAB — MRSA NEXT GEN BY PCR, NASAL: MRSA by PCR Next Gen: NOT DETECTED

## 2021-10-20 LAB — PROCALCITONIN: Procalcitonin: 4.51 ng/mL

## 2021-10-20 LAB — CK: Total CK: 3462 U/L — ABNORMAL HIGH (ref 38–234)

## 2021-10-20 LAB — ETHANOL: Alcohol, Ethyl (B): <10 mg/dL (ref <10)

## 2021-10-20 LAB — LACTIC ACID, PLASMA
Lactic Acid, Venous: 1.4 mmol/L (ref 0.5–1.9)
Lactic Acid, Venous: 1.5 mmol/L (ref 0.5–1.9)

## 2021-10-20 MED ORDER — PANTOPRAZOLE 2 MG/ML SUSPENSION
40.0000 mg | Freq: Every day | ORAL | Status: DC
  Filled 2021-10-20: qty 20

## 2021-10-20 MED ORDER — FENTANYL CITRATE (PF) 100 MCG/2ML IJ SOLN: 25.0000 ug | INTRAMUSCULAR | Status: DC | PRN

## 2021-10-20 MED ORDER — ALTEPLASE 2 MG IJ SOLR: 2.0000 mg | Freq: Once | INTRAMUSCULAR | Status: DC | PRN

## 2021-10-20 MED ORDER — LORAZEPAM 2 MG/ML IJ SOLN: 2.0000 mg | Freq: Once | INTRAMUSCULAR | Status: DC | PRN

## 2021-10-20 MED ORDER — LIDOCAINE-PRILOCAINE 2.5-2.5 % EX CREA: 1.0000 "application " | TOPICAL_CREAM | CUTANEOUS | Status: DC | PRN

## 2021-10-20 MED ORDER — CHLORHEXIDINE GLUCONATE CLOTH 2 % EX PADS
6.0000 | MEDICATED_PAD | Freq: Every day | CUTANEOUS | Status: DC
  Administered 2021-10-22: 6 via TOPICAL

## 2021-10-20 MED ORDER — INSULIN ASPART 100 UNIT/ML IJ SOLN
0.0000 [IU] | INTRAMUSCULAR | Status: DC
  Administered 2021-10-20 – 2021-10-22 (×3): 1 [IU] via SUBCUTANEOUS

## 2021-10-20 MED ORDER — HEPARIN SODIUM (PORCINE) 1000 UNIT/ML DIALYSIS: 1000.0000 [IU] | INTRAMUSCULAR | Status: DC | PRN

## 2021-10-20 MED ORDER — SODIUM BICARBONATE 8.4 % IV SOLN
INTRAVENOUS | Status: AC
  Filled 2021-10-20: qty 50

## 2021-10-20 MED ORDER — ETOMIDATE 2 MG/ML IV SOLN
INTRAVENOUS | Status: DC | PRN
  Administered 2021-10-20: 20 mg via INTRAVENOUS

## 2021-10-20 MED ORDER — MIDAZOLAM HCL 2 MG/2ML IJ SOLN
2.0000 mg | INTRAMUSCULAR | Status: DC | PRN
  Administered 2021-10-20 – 2021-10-21 (×3): 1 mg via INTRAVENOUS
  Filled 2021-10-20 (×3): qty 2

## 2021-10-20 MED ORDER — PANTOPRAZOLE SODIUM 40 MG IV SOLR
40.0000 mg | Freq: Two times a day (BID) | INTRAVENOUS | Status: DC
  Administered 2021-10-20 – 2021-10-21 (×3): 40 mg via INTRAVENOUS
  Filled 2021-10-20 (×3): qty 10

## 2021-10-20 MED ORDER — DOCUSATE SODIUM 50 MG/5ML PO LIQD
100.0000 mg | Freq: Two times a day (BID) | ORAL | Status: DC | PRN
Start: 2021-10-20 — End: 2021-10-23

## 2021-10-20 MED ORDER — LIDOCAINE HCL (PF) 1 % IJ SOLN: 5.0000 mL | INTRAMUSCULAR | Status: DC | PRN

## 2021-10-20 MED ORDER — SODIUM BICARBONATE 8.4 % IV SOLN
50.0000 meq | Freq: Once | INTRAVENOUS | Status: AC
  Administered 2021-10-20: 50 meq via INTRAVENOUS

## 2021-10-20 MED ORDER — CHLORHEXIDINE GLUCONATE 0.12% ORAL RINSE (MEDLINE KIT)
15.0000 mL | Freq: Two times a day (BID) | OROMUCOSAL | Status: DC
  Administered 2021-10-20 – 2021-10-22 (×5): 15 mL via OROMUCOSAL

## 2021-10-20 MED ORDER — SODIUM CHLORIDE 0.9 % IV SOLN: 100.0000 mL | INTRAVENOUS | Status: DC | PRN

## 2021-10-20 MED ORDER — ROCURONIUM BROMIDE 50 MG/5ML IV SOLN
INTRAVENOUS | Status: DC | PRN
  Administered 2021-10-20: 80 mg via INTRAVENOUS

## 2021-10-20 MED ORDER — FENTANYL 2500MCG IN NS 250ML (10MCG/ML) PREMIX INFUSION
0.0000 ug/h | INTRAVENOUS | Status: DC
  Administered 2021-10-20: 25 ug/h via INTRAVENOUS
  Filled 2021-10-20: qty 250

## 2021-10-20 MED ORDER — DOCUSATE SODIUM 100 MG PO CAPS: 100.0000 mg | ORAL_CAPSULE | Freq: Two times a day (BID) | ORAL | Status: DC | PRN

## 2021-10-20 MED ORDER — FENTANYL CITRATE PF 50 MCG/ML IJ SOSY: 25.0000 ug | PREFILLED_SYRINGE | INTRAMUSCULAR | Status: DC | PRN

## 2021-10-20 MED ORDER — SODIUM CHLORIDE 0.9 % IV SOLN
2000.0000 mg | Freq: Once | INTRAVENOUS | Status: AC
  Administered 2021-10-20: 2000 mg via INTRAVENOUS
  Filled 2021-10-20: qty 20

## 2021-10-20 MED ORDER — ORAL CARE MOUTH RINSE
15.0000 mL | OROMUCOSAL | Status: DC
  Administered 2021-10-20 – 2021-10-23 (×26): 15 mL via OROMUCOSAL

## 2021-10-20 MED ORDER — LACTATED RINGERS IV SOLN
INTRAVENOUS | Status: AC
Start: 2021-10-20 — End: 2021-10-20

## 2021-10-20 MED ORDER — SODIUM CHLORIDE 0.9 % IV SOLN
2.0000 g | INTRAVENOUS | Status: DC
  Administered 2021-10-20 – 2021-10-22 (×3): 2 g via INTRAVENOUS
  Filled 2021-10-20 (×3): qty 20

## 2021-10-20 MED ORDER — PENTAFLUOROPROP-TETRAFLUOROETH EX AERO: 1.0000 "application " | INHALATION_SPRAY | CUTANEOUS | Status: DC | PRN

## 2021-10-20 MED ORDER — FENTANYL CITRATE (PF) 100 MCG/2ML IJ SOLN
25.0000 ug | INTRAMUSCULAR | Status: DC | PRN
  Administered 2021-10-22: 100 ug via INTRAVENOUS
  Administered 2021-10-22 (×2): 50 ug via INTRAVENOUS
  Administered 2021-10-22 (×2): 100 ug via INTRAVENOUS
  Administered 2021-10-22: 50 ug via INTRAVENOUS
  Filled 2021-10-20 (×6): qty 2

## 2021-10-20 MED ORDER — POLYETHYLENE GLYCOL 3350 17 G PO PACK: 17.0000 g | PACK | Freq: Every day | ORAL | Status: DC | PRN

## 2021-10-20 NOTE — Progress Notes (Signed)
eLink Physician-Brief Progress Note ?Patient Name: [name] ?DOB: [birth year] ?MRN: [id number] ? ? ?Date of Service ? 09/28/2021  ?HPI/Events of Note ? ABG reviewed.  ?eICU Interventions ? One amp of sodium bicarbonate ordered iv push.  ? ? ? ?  ? ?Kerry Kass Kyran Whittier ?09/25/2021, 10:04 PM ?

## 2021-10-20 NOTE — Progress Notes (Deleted)
eLink Physician-Brief Progress Note ?Patient Name: [name] ?DOB: [birth year] ?MRN: [id number] ? ? ?Date of Service ? 10/08/2021  ?HPI/Events of Note ?   ?eICU Interventions ?   ? ? ? ?Intervention Category ?Major Interventions: Seizures - evaluation and management;Respiratory failure - evaluation and management ? ?Frederik Pear ?10/12/2021, 8:47 PM ?

## 2021-10-20 NOTE — Consult Note (Signed)
[facility] ?Renal Consultation Note ? ?Requesting MD: Roslynn Amble ?Indication for Consultation: advanced CKD-  found down ? ?HPI:  ?[name] is a 81 y.o. female with history of breast CA, DM, HTN and past CVA who I follow in the clinic for advanced CKD- we were in the dialysis prep phase-  s/p AVF that was declared ready to use.  I saw her on 3/16-  was doing OK-  crt in the 5-6 range-  was going to be set up for procrit with hgb in the 9's.  According to ER-  the last time pt was seen was Thursday  3/23-  doing fine.  Today , someone went to check on her and she was unresponsive-  brought to ER-  intubated for airway protection-  HCT negative for bleed.  Lab show crt of 10 and BUN of 130 so I am asked to see-  pt is intubated-  unresponsive to my exam - BP if anything is high  ? ?Creatinine, Ser  ?Date/Time Value Ref Range Status  ?10/21/2021 01:27 PM 10.50 (H) 0.44 - 1.00 mg/dL Final  ?06/11/2021 12:44 PM 5.20 (H) 0.44 - 1.00 mg/dL Final  ?05/04/2020 02:07 AM 2.28 (H) 0.44 - 1.00 mg/dL Final  ?05/02/2020 01:13 AM 2.55 (H) 0.44 - 1.00 mg/dL Final  ?05/01/2020 04:45 AM 2.58 (H) 0.44 - 1.00 mg/dL Final  ?04/30/2020 03:38 AM 2.83 (H) 0.44 - 1.00 mg/dL Final  ?04/29/2020 03:24 AM 3.30 (H) 0.44 - 1.00 mg/dL Final  ?04/28/2020 08:08 AM 3.91 (H) 0.44 - 1.00 mg/dL Final  ?04/28/2020 03:16 AM 3.80 (H) 0.44 - 1.00 mg/dL Final  ?04/27/2020 10:26 AM 4.28 (H) 0.44 - 1.00 mg/dL Final  ?04/27/2020 02:30 AM 4.40 (H) 0.44 - 1.00 mg/dL Final  ?04/26/2020 04:35 PM 4.89 (H) 0.44 - 1.00 mg/dL Final  ?04/26/2020 05:00 AM 5.29 (H) 0.44 - 1.00 mg/dL Final  ?04/25/2020 04:39 PM 5.52 (H) 0.44 - 1.00 mg/dL Final  ?04/10/2018 10:45 AM 1.97 (H) 0.44 - 1.00 mg/dL Final  ?03/29/2018 05:29 AM 1.92 (H) 0.44 - 1.00 mg/dL Final  ?03/28/2018 05:40 AM 1.99 (H) 0.44 - 1.00 mg/dL Final  ?03/27/2018 02:38 AM 1.79 (H) 0.44 - 1.00 mg/dL Final  ?03/26/2018 06:44 AM 1.80 (H) 0.44 - 1.00 mg/dL Final  ?03/25/2018 02:47 AM 1.70 (H) 0.44 - 1.00  mg/dL Final  ?03/24/2018 08:14 AM 2.00 (H) 0.44 - 1.00 mg/dL Final  ?03/24/2018 08:01 AM 1.95 (H) 0.44 - 1.00 mg/dL Final  ? ? ? ?PMHx: ?  ?Past Medical History:  ?Diagnosis Date  ? Anxiety   ? Back ache   ? Breast cancer ([place])   ? right  ? Bronchitis   ? Cancer of colon ([place])   ? Carotid arterial disease ([place])   ? a. 02/2018 Carotid U/S: 1-39% bilat ICA stenosis.  ? Chronic Dyspnea on exertion   ? a. 08/2017 MV: EF 62%, mid ant/apical ant defect. No ischemia-->low risk; b. 02/2018 Echo: EF 60-65%, no rwma, Gr1 DD. Mild AI. Asc Ao 56m. Triv MR/TR/PR. Nl RV fxn.  ? CKD (chronic kidney disease), stage IV [place]  ? Depression   ? [place]   ? Type II  ? Family history of primary TB   ? Fracture of ankle, closed 2014  ? left  ? History of chicken pox   ? History of hysterectomy 04/1980  ? History of primary TB   ? Hyperlipidemia   ? Hypertension   ? Knee pain, right   ?  Lung abnormality   ? age 62 patient had pna spot on lungs   ? Microalbuminuria   ? Muscle cramps   ? both legs  ? 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 Nephrolithiasis   ? Palpitations   ? a. 2007 s/p catheter ablation for tachycardia, though pt is not aware of details.  ? Personal history of radiation therapy   ? Stroke [facility])   ? a. 02/2018 non hemorrhagic 27m R paramedian pontine infarct. Chronic microvascular ischemia.  ? ? ?Past Surgical History:  ?Procedure Laterality Date  ? ABLATION  2007  ? fast heart rate  ? ANKLE FRACTURE SURGERY Left 2014  ? AV FISTULA PLACEMENT Left 06/11/2021  ? Procedure: LEFT ARM BRACHIOCEPHALIC CREATION;  Surgeon: RBroadus John MD;  [facility];  Service: Vascular;  Laterality: Left;  PERIPHERAL NERVE BLOCK  ? BREAST LUMPECTOMY Right   ? CATARACT EXTRACTION Right 2017  ? colonscopy  2015  ? HYSTEROTOMY    ? 45 years ago  ? TONSILLECTOMY AND ADENOIDECTOMY    ? age 81 ? ? ?Family Hx:  ?Family History  ?Problem Relation Age of Onset  ? Peripheral Artery Disease Mother 825 ?     HX OF POOR  CIRCULATION, SMOKING, LEG AMPUTATION  ? Cancer Father   ?     cancer of spine  ? Asthma Brother 753 ? Kidney Stones Brother   ? Breast cancer Neg Hx   ? Colon cancer Neg Hx   ? Colon polyps Neg Hx   ? ? ?Social History:  reports that she has never smoked. She has never used smokeless tobacco. She reports current alcohol use. She reports that she does not use drugs. ? ?Allergies:  ?Allergies  ?Allergen Reactions  ? Lasix [Furosemide] Other (See Comments)  ? Lisinopril   ?  Other reaction(s): worsening renal function  ? Other   ?  Other reaction(s): bowels loose  ? ? ?Medications: ?Prior to Admission medications   ?Medication Sig Start Date End Date Taking? Authorizing Provider  ?acetaminophen (TYLENOL) 325 MG tablet Take 2 tablets (650 mg total) by mouth every 4 (four) hours as needed for mild pain (or temp > 37.5 C (99.5 F)). 04/12/18   Angiulli, DLavon Paganini PA-C  ?ARIPiprazole (ABILIFY) 5 MG tablet Take 1 tablet (5 mg total) by mouth daily. 04/12/18   Angiulli, DLavon Paganini PA-C  ?atorvastatin (LIPITOR) 80 MG tablet Take 1 tablet (80 mg total) by mouth daily at 6 PM. ?Patient taking differently: Take 80 mg by mouth in the morning. 04/12/18   Angiulli, DLavon Paganini PA-C  ?BD PEN NEEDLE NANO 2ND GEN 32G X 4 MM MISC 1 each by Other route See admin instructions. Use with [place]. 07/24/19   [provider]  ?beta carotene w/minerals (OCUVITE) tablet Take 1 tablet by mouth in the morning.    [provider]  ?CALCIUM PO Take 1 tablet by mouth in the morning.    [provider]  ?Cholecalciferol (QC VITAMIN D3) 50 MCG (2000 UT) TABS Take 2,000 Units by mouth in the morning.    [provider]  ?clopidogrel (PLAVIX) 75 MG tablet Take 1 tablet (75 mg total) by mouth daily. Restart on 06/13/21 06/11/21   BKaroline Caldwell PA-C  ?HUMALOG MIX 75/25 KWIKPEN (75-25) 100 UNIT/ML KwikPen Inject 44 Units into the skin in the morning and at bedtime. 03/14/21   [provider]  ?hydrALAZINE  (APRESOLINE) 25 MG tablet Take  25 mg by mouth 3 (three) times daily. 05/23/21   [provider]  ?LORazepam (ATIVAN) 0.5 MG tablet Take 1 tablet (0.5 mg total) by mouth every 6 (six) hours as needed for anxiety. 05/05/20   Aline August, MD  ?metoprolol succinate (TOPROL-XL) 25 MG 24 hr tablet Take 25 mg by mouth at bedtime. 03/01/21   [provider]  ?mirtazapine (REMERON) 15 MG tablet Take 1 tablet (15 mg total) by mouth at bedtime. 04/12/18   Angiulli, Lavon Paganini, PA-C  ?Multiple Vitamin (MULTIVITAMIN WITH MINERALS) TABS tablet Take 1 tablet by mouth in the morning. Centrum Silver Women's Multivitamin    [provider]  ?omega-3 acid ethyl esters (LOVAZA) 1 g capsule Take 2 g by mouth 2 (two) times daily.    [provider]  ?Roma Schanz test strip 1 each by Other route See admin instructions. Use 1 strip to test blood glucose twice daily. 08/16/18   [provider]  ?senna (SENOKOT) 8.6 MG tablet Take 1 tablet by mouth at bedtime.    [provider]  ?traMADol (ULTRAM) 50 MG tablet Take 1 tablet (50 mg total) by mouth every 6 (six) hours as needed. 06/11/21 06/11/22  Baglia, Corrina, PA-C  ?venlafaxine XR (EFFEXOR-XR) 150 MG 24 hr capsule Take 150 mg by mouth in the morning. 05/12/18   [provider]  ? ? I have reviewed the patient's current medications. ? ?Labs:  ?Results for orders placed or performed during the hospital encounter of 09/29/2021 (from the past 48 hour(s))  ?I-stat chem 8, ED     Status: Abnormal  ? Collection Time: 09/25/2021  1:27 PM  ?Result Value Ref Range  ? Sodium 142 135 - 145 mmol/L  ? Potassium 4.8 3.5 - 5.1 mmol/L  ? Chloride 113 (H) 98 - 111 mmol/L  ? BUN >130 (H) 8 - 23 mg/dL  ? Creatinine, Ser 10.50 (H) 0.44 - 1.00 mg/dL  ? Glucose, Bld 175 (H) 70 - 99 mg/dL  ?  Comment: Glucose reference range applies only to samples taken after fasting for at least 8 hours.  ? Calcium, Ion 1.03 (L) 1.15 - 1.40 mmol/L  ? TCO2 11 (L) 22 -  32 mmol/L  ? Hemoglobin 9.5 (L) 12.0 - 15.0 g/dL  ? HCT 28.0 (L) 36.0 - 46.0 %  ?I-Stat arterial blood gas, ED     Status: Abnormal  ? Collection Time: 09/23/2021  2:02 PM  ?Result Value Ref Range  ? pH, Art

## 2021-10-20 NOTE — ED Notes (Signed)
2.5 versed given via EMS ?

## 2021-10-20 NOTE — Progress Notes (Signed)
Patient transported to 2M10 from ED without complications. RN at bedside. 

## 2021-10-20 NOTE — Consult Note (Signed)
NEUROLOGY CONSULTATION NOTE  ? ?Date of service: October 20, 2021 ?Patient Name: [name] ?MRN:  [id number] ?DOB:  1940-11-13 ?Reason for consult: unresponsiveness, possible seizure activity ?Requesting physician: Dr. Madalyn Rob ?_ _ _   _ __   _ __ _ _  __ __   _ __   __ _ ? ?History of Present Illness  ? ?This is an 81 year old woman with a history of carotid artery stenosis, CKD stage IV, diabetes, hypertension, hyperlipidemia, remote pontine stroke who was found unresponsive by a neighbor today.   ? ?History obtained from daughter-in-law at bedside who states that family last spoke to patient last Thursday (5 days ago).  Son is an Doctor, general practice here.  Patient was feeling well and at baseline last week when seen by family.  She recently underwent fistula placement due to progression of her underlying CKD. ? ?Per EMS, patient had witnessed rhythmic jerking of her L side en route to hospital which resolved with 2.51m versed. Stroke code was activated by Dr. DRoslynn Ambledue to a very poor neuro exam with essentially only intact pupils prior to intubation in the ED.  On my examination she had are just received rocuronium and therefore accurate neurologic assessment was unable to be obtained. 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 Head CT was obtained and was remarkable only for questionable white matter hypodensity underlying R parietal streak artifact (personal review and d/w radiology). TNK not administered 2/2 LKW 5 days ago and active GIB. Suspicion for LVO within intervenable time period was low (given LKW 5 days ago) therefore CTA was not performed given baseline creatinine of 5 and patient very close to requiring dialysis but not started on HD yet.  ? ?Labs in ED showed creatinine of 10 and BUN 130. ?  ?ROS  ? ?UTA 2/2 coma ? ?Past History  ? ?I have reviewed the following: ? ?Past Medical History:  ?Diagnosis Date  ? Anxiety   ? Back ache   ? Breast  cancer ([place]   ? right  ? Bronchitis   ? Cancer of colon ([place]   ? Car[place]id arterial disease ([place]   ? a. 02/2018 Carotid U/S: 1-39% bilat ICA stenosis.  ? Chronic Dyspnea on exertion   ? a. 08/2017 MV: EF 62%, mid ant/apical ant defect. No ischemia-->low risk; b. 02/2018 Echo: EF 60-65%, no rwma, Gr1 DD. Mild AI. Asc Ao 34m Triv MR/TR/PR. Nl RV fxn.  ? CKD (chronic kidney disease), stage IV ([place]  ? Depression   ? DM (diabetes mellitus) ([facility]  ? Type II  ? Family history of primary TB   ? Fracture of ankle, closed 2014  ? left  ? History of chicken pox   ? History of hysterectomy 04/1980  ? History of primary TB   ? Hyperlipidemia   ? Hypertension   ? Knee pain, right   ? Lung abnormality   ? age [age]at[age] had pna spot on lungs   ? Microalbuminuria   ? Muscle cramps   ? both legs  ? 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 Nephrolithiasis   ? Palpitations   ? a. 2007 s/p catheter ablation for tachycardia, though pt is not aware of details.  ? Personal history of radiation therapy   ? [facility]  ? a. 02/2018 non hemorrhagic 121m paramedian pontine infarct. Chronic microvascular ischemia.  ? ?Past Surgical History:  ?Procedure Laterality  Date  ? ABLATION  2007  ? fast heart rate  ? ANKLE FRACTURE SURGERY Left 2014  ? AV FISTULA PLACEMENT Left 06/11/2021  ? Procedure: LEFT ARM BRACHIOCEPHALIC CREATION;  Surgeon: Robins, Joshua E, MD;  Location: [facility];  Service: Vascular;  Laterality: Left;  PERIPHERAL NERVE BLOCK  ? BREAST LUMPECTOMY Right   ? CATARACT EXTRACTION Right 2017  ? colonscopy  2015  ? HYSTEROTOMY    ? 45 years ago  ? TONSILLECTOMY AND ADENOIDECTOMY    ? age 3  ? ?Family History  ?Problem Relation Age of Onset  ? Peripheral Artery Disease Mother 81  ?     HX OF POOR CIRCULATION, SMOKING, LEG AMPUTATION  ? Cancer Father   ?     cancer of spine  ? Asthma Brother 73  ? Kidney Stones Brother   ? Breast cancer Neg Hx   ? Colon cancer Neg Hx   ? Colon polyps Neg Hx   ? ?Social History  ? ?Socioeconomic  History  ? Marital status: Divorced  ?  Spouse name: Not on file  ? Number of children: 2  ? Years of education: Not on file  ? Highest education level: Not on file  ?Occupational History  ? Not on file  ?Tobacco Use  ? Smoking status: Never  ? Smokeless tobacco: Never  ?Vaping Use  ? Vaping Use: Never used  ?Substance and Sexual Activity  ? Alcohol use: Yes  ?  Comment: occ  ? Drug use: No  ? Sexual activity: Not Currently  ?Other Topics Concern  ? Not on file  ?Social History Narrative  ? Not on file  ? ?Social Determinants of Health  ? ?Financial Resource Strain: Not on file  ?Food Insecurity: Not on file  ?Transportation Needs: Not on file  ?Physical Activity: Not on file  ?Stress: Not on file  ?Social Connections: Not on file  ? ?Allergies  ?Allergen Reactions  ? Lasix [Furosemide] Other (See Comments)  ? Lisinopril   ?  Other reaction(s): worsening renal function  ? Other   ?  Other reaction(s): bowels loose  ? ? ?Medications  ? ?Medications Prior to Admission  ?Medication Sig Dispense Refill Last Dose  ? acetaminophen (TYLENOL) 325 MG tablet Take 2 tablets (650 mg total) by mouth every 4 (four) hours as needed for mild pain (or temp > 37.5 C (99.5 F)).   unknown  ? ARIPiprazole (ABILIFY) 5 MG tablet Take 1 tablet (5 mg total) by mouth daily. 30 tablet 3 10/19/2021  ? atorvastatin (LIPITOR) 80 MG tablet Take 1 tablet (80 mg total) by mouth daily at 6 PM. (Patient taking differently: Take 80 mg by mouth in the morning.) 30 tablet 0 10/19/2021  ? beta carotene w/minerals (OCUVITE) tablet Take 1 tablet by mouth daily.   10/19/2021  ? CALCIUM PO Take 1 tablet by mouth daily.   10/19/2021  ? Cholecalciferol (QC VITAMIN D3) 50 MCG (2000 UT) TABS Take 2,000 Units by mouth in the morning.   10/19/2021  ? clopidogrel (PLAVIX) 75 MG tablet Take 1 tablet (75 mg total) by mouth daily. Restart on 06/13/21 30 tablet 0 10/19/2021  ? furosemide (LASIX) 20 MG tablet Take 20 mg by mouth daily.   10/19/2021  ? HUMALOG MIX 75/25 KWIKPEN  (75-25) 100 UNIT/ML KwikPen Inject 44 Units into the skin in the morning and at bedtime.   10/19/2021  ? hydrALAZINE (APRESOLINE) 25 MG tablet Take 25 mg by mouth 3 (three) times daily.     10/19/2021  ? metoprolol succinate (TOPROL-XL) 50 MG 24 hr tablet Take 50 mg by mouth daily.   10/19/2021 at 2000  ? mirtazapine (REMERON) 15 MG tablet Take 1 tablet (15 mg total) by mouth at bedtime. 30 tablet 1 10/19/2021  ? Multiple Vitamin (MULTIVITAMIN WITH MINERALS) TABS tablet Take 1 tablet by mouth in the morning. Centrum Silver Women's Multivitamin   10/19/2021  ? omega-3 acid ethyl esters (LOVAZA) 1 g capsule Take 2 g by mouth 2 (two) times daily.   10/19/2021  ? senna (SENOKOT) 8.6 MG tablet Take 1 tablet by mouth at bedtime.   10/19/2021  ? venlafaxine XR (EFFEXOR-XR) 150 MG 24 hr capsule Take 150 mg by mouth daily with breakfast.  3 10/19/2021  ? BD PEN NEEDLE NANO 2ND GEN 32G X 4 MM MISC 1 each by Other route See admin instructions. Use with Tresiba daily.     ? LORazepam (ATIVAN) 0.5 MG tablet Take 1 tablet (0.5 mg total) by mouth every 6 (six) hours as needed for anxiety. (Patient not taking: Reported on 10/22/2021) 14 tablet 0 Not Taking  ? ONETOUCH VERIO test strip 1 each by Other route See admin instructions. Use 1 strip to test blood glucose twice daily.     ? traMADol (ULTRAM) 50 MG tablet Take 1 tablet (50 mg total) by mouth every 6 (six) hours as needed. (Patient not taking: Reported on 10/07/2021) 8 tablet 0 Not Taking  ?  ? ? ?Current Facility-Administered Medications:  ?  cefTRIAXone (ROCEPHIN) 2 g in sodium chloride 0.9 % 100 mL IVPB, 2 g, Intravenous, Q24H, Smith, Daniel C, MD, Paused at 10/09/2021 1612 ?  chlorhexidine gluconate (MEDLINE KIT) (PERIDEX) 0.12 % solution 15 mL, 15 mL, Mouth Rinse, BID, Simpson, Paula B, NP ?  [START ON 10/21/2021] Chlorhexidine Gluconate Cloth 2 % PADS 6 each, 6 each, Topical, Q0600, G[place], Kellie, MD ?  docusate (COLACE) 50 MG/5ML liquid 100 mg, 100 mg, Per Tube, BID PRN,  Smith, Daniel C, MD ?  etomidate (AMIDATE) injection, , Intravenous, PRN, Dykstra, Richard S, MD, 20 mg at 10/13/2021 1309 ?  fentaNYL (SUBLIMAZE) injection 25 mcg, 25 mcg, Intravenous, Q15 min PRN, [place]

## 2021-10-20 NOTE — Progress Notes (Signed)
EEG complete - results pending 

## 2021-10-20 NOTE — ED Notes (Signed)
ED TO INPATIENT HANDOFF REPORT ? ?ED Nurse Name and Phone #: Najma Bozarth [phone] ? ?S ?Name/Age/Gender ?[name] ?81 y.o. ?female ?Room/Bed: TRACC/TRACC ? ?Code Status ?  Code Status: Full Code ? ?Home/SNF/Other ?Home ? ?Is this baseline? No  ? ?   ?Chief Complaint ?Acute renal failure (ARF) (HCC) [N17.9] ? ?Triage Note ?Patient BIB [facility] from home, patient was found unresponsive by a neighbor. Per EMS has some seizure-like activity and was given 2.5 versed. Pt unresponsive to painful stimuli upon arrival to department.  ? ?Allergies ?Allergies  ?Allergen Reactions  ? Lasix [Furosemide] Other (See Comments)  ? Lisinopril   ?  Other reaction(s): worsening renal function  ? Other   ?  Other reaction(s): bowels loose  ? ? ?Level of Care/Admitting Diagnosis ?ED Disposition   ? ? ED Disposition  ?Admit  ? Condition  ?--  ? Comment  ?Hospital [facility]: [facility] [[id number]] ? Level of Care: ICU [6] ? May admit patient to [facility] or [facility] if equivalent level of care is available:: Yes ? Covid Evaluation: Asymptomatic - no recent exposure (last 10 days) testing not required ? Diagnosis: Acute renal failure (ARF) ([place]) [[id number]] ? Admitting Physician: Candee Furbish [[id number]] ? Attending Physician: Candee Furbish [[id number]] ? Estimated length of stay: past midnight tomorrow ? Certification:: I certify this patient will need inpatient services for at least 2 midnights ?  ?  ? ?  ? ? ?B ?Medical/Surgery History ?Past Medical History:  ?Diagnosis Date  ? Anxiety   ? Back ache   ? Breast cancer ([place])   ? right  ? Bronchitis   ? Cancer of colon ([place])   ? Carotid arterial disease ([place])   ? a. 02/2018 Carotid U/S: 1-39% bilat ICA stenosis.  ? Chronic Dyspnea on exertion   ? a. 08/2017 MV: EF 62%, mid ant/apical ant defect. No ischemia-->low risk; b. 02/2018 Echo: EF 60-65%, no rwma, Gr1 DD. Mild AI. Asc Ao 73m. Triv MR/TR/PR. Nl RV fxn.  ? CKD (chronic kidney disease), stage IV ([place] 2017  ? Depression   ? DM  (diabetes mellitus) ([place]   ? Type II  ? Family history of primary TB   ? Fracture of ankle, closed 2014  ? left  ? History of chicken pox   ? History of hysterectomy 04/1980  ? History of primary TB   ? Hyperlipidemia   ? Hypertension   ? Knee pain, right   ? Lung abnormality   ? age [age]patient had pna spot on lungs   ? Microalbuminuria   ? Muscle cramps   ? both legs  ? 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 Nephrolithiasis   ? Palpitations   ? a. 2007 s/p catheter ablation for tachycardia, though pt is not aware of details.  ? Personal history of radiation therapy   ? Stroke ([facility]   ? a. 02/2018 non hemorrhagic 151mR paramedian pontine infarct. Chronic microvascular ischemia.  ? ?Past Surgical History:  ?Procedure Laterality Date  ? ABLATION  2007  ? fast heart rate  ? ANKLE FRACTURE SURGERY Left 2014  ? AV FISTULA PLACEMENT Left 06/11/2021  ? Procedure: LEFT ARM BRACHIOCEPHALIC CREATION;  Surgeon: RoBroadus JohnMD;  [facility];  Service: Vascular;  Laterality: Left;  PERIPHERAL NERVE BLOCK  ? BREAST LUMPECTOMY Right   ? CATARACT EXTRACTION Right 2017  ? colonscopy  2015  ? HYSTEROTOMY    ? 45 years  ago  ? TONSILLECTOMY AND ADENOIDECTOMY    ? age 33  ?  ? ?A ?IV Location/Drains/Wounds ?Patient Lines/Drains/Airways Status   ? ? Active Line/Drains/Airways   ? ? Name Placement date Placement time Site Days  ? Peripheral IV 10/07/2021 22 G Right Hand 10/16/2021  1309  Hand  less than 1  ? Fistula / Graft Left Forearm Arteriovenous fistula 06/11/21  1626  Forearm  131  ? NG/OG Vented/Dual Lumen 16 Fr. Oral 09/26/2021  1338  Oral  less than 1  ? Urethral Catheter Jen RN Temperature probe 14 Fr. 10/07/2021  1407  Temperature probe  less than 1  ? Airway 7.5 mm 10/15/2021  1310  -- less than 1  ? Incision (Closed) 06/11/21 Arm Left 06/11/21  1411  -- 131  ? ?  ?  ? ?  ? ? ?Intake/Output Last 24 hours ?No intake or output data in the 24 hours ending 09/23/2021 1454 ? ?Labs/Imaging ?Results for orders placed or performed  during the hospital encounter of 09/26/2021 (from the past 48 hour(s))  ?Ethanol     Status: None  ? Collection Time: 10/21/2021  1:14 PM  ?Result Value Ref Range  ? Alcohol, Ethyl (B) <10 <10 mg/dL  ?  Comment: (NOTE) ?Lowest detectable limit for serum alcohol is 10 mg/dL. ? ?For medical purposes only. ?Performed at [facility], [place] [address] [place], [place] ?[address] ?  ?Urine rapid drug screen (hosp performed)     Status: Abnormal  ? Collection Time: 10/21/2021  1:14 PM  ?Result Value Ref Range  ? Opiates NONE DETECTED NONE DETECTED  ? Cocaine NONE DETECTED NONE DETECTED  ? Benzodiazepines POSITIVE (A) NONE DETECTED  ? Amphetamines NONE DETECTED NONE DETECTED  ? Tetrahydrocannabinol NONE DETECTED NONE DETECTED  ? Barbiturates NONE DETECTED NONE DETECTED  ?  Comment: (NOTE) ?DRUG SCREEN FOR MEDICAL PURPOSES ?ONLY.  IF CONFIRMATION IS NEEDED ?FOR ANY PURPOSE, NOTIFY LAB ?WITHIN 5 DAYS. ? ?LOWEST DETECTABLE LIMITS ?FOR URINE DRUG SCREEN ?Drug Class                     Cutoff (ng/mL) ?Amphetamine and metabolites    1000 ?Barbiturate and metabolites    200 ?Benzodiazepine                 200 ?Tricyclics and metabolites     300 ?Opiates and metabolites        300 ?Cocaine and metabolites        300 ?THC                            50 ?Performed at [facility], [place] [address] [place], [place] ?[address] ?  ?Urinalysis, Routine w reflex microscopic Urine, Catheterized     Status: Abnormal  ? Collection Time: 10/09/2021  1:14 PM  ?Result Value Ref Range  ? Color, Urine AMBER (A) YELLOW  ?  Comment: BIOCHEMICALS MAY BE AFFECTED BY COLOR  ? APPearance TURBID (A) CLEAR  ? Specific Gravity, Urine 1.016 1.005 - 1.030  ? pH 5.0 5.0 - 8.0  ? Glucose, UA NEGATIVE NEGATIVE mg/dL  ? Hgb urine dipstick LARGE (A) NEGATIVE  ? Bilirubin Urine NEGATIVE NEGATIVE  ? Ketones, ur 5 (A) NEGATIVE mg/dL  ? Protein, ur 100 (A) NEGATIVE mg/dL  ? Nitrite NEGATIVE NEGATIVE  ? Leukocytes,Ua MODERATE (A) NEGATIVE  ? RBC / HPF 11-20 0 - 5  RBC/hpf  ?  WBC, UA >50 (H) 0 - 5 WBC/hpf  ? Bacteria, UA MANY (A) NONE SEEN  ? Squamous Epithelial / LPF 0-5 0 - 5  ? WBC Clumps PRESENT   ? Mucus PRESENT   ? Hyaline Casts, UA PRESENT   ? Granular Casts, UA PRESENT   ? Amorphous Crystal PRESENT   ? Non Squamous Epithelial 6-10 (A) NONE SEEN  ?  Comment: Performed at [facility], [place] [address], [place], [place] [address]  ?Protime-INR     Status: Abnormal  ? Collection Time: 10/14/2021  1:20 PM  ?Result Value Ref Range  ? Prothrombin Time 18.3 (H) 11.4 - 15.2 seconds  ? INR 1.5 (H) 0.8 - 1.2  ?  Comment: (NOTE) ?INR goal varies based on device and disease states. ?Performed at [facility], [place] [address] [place], [place] ?[address] ?  ?APTT     Status: None  ? Collection Time: 09/24/2021  1:20 PM  ?Result Value Ref Range  ? aPTT 35 24 - 36 seconds  ?  Comment: Performed at [facility], [place] [address], [place], [place] [address]  ?CBC     Status: Abnormal  ? Collection Time: 10/14/2021  1:20 PM  ?Result Value Ref Range  ? WBC 18.6 (H) 4.0 - 10.5 K/uL  ? RBC 2.62 (L) 3.87 - 5.11 MIL/uL  ? Hemoglobin 8.8 (L) 12.0 - 15.0 g/dL  ? HCT 27.4 (L) 36.0 - 46.0 %  ? MCV 104.6 (H) 80.0 - 100.0 fL  ? MCH 33.6 26.0 - 34.0 pg  ? MCHC 32.1 30.0 - 36.0 g/dL  ? RDW 13.3 11.5 - 15.5 %  ? Platelets 294 150 - 400 K/uL  ? nRBC 0.0 0.0 - 0.2 %  ?  Comment: Performed at [facility], [place] [address] [place], [place] [address]  ?Differential     Status: Abnormal  ? Collection Time: 10/12/2021  1:20 PM  ?Result Value Ref Range  ? Neutrophils Relative % 88 %  ? Neutro Abs 16.6 (H) 1.7 - 7.7 K/uL  ? Lymphocytes Relative 4 %  ? Lymphs Abs 0.7 0.7 - 4.0 K/uL  ? Monocytes Relative 7 %  ? Monocytes Absolute 1.2 (H) 0.1 - 1.0 K/uL  ? Eosinophils Relative 0 %  ? Eosinophils Absolute 0.0 0.0 - 0.5 K/uL  ? Basophils Relative 0 %  ? Basophils Absolute 0.0 0.0 - 0.1 K/uL  ? Immature Granulocytes 1 %  ? Abs Immature Granulocytes 0.12 (H) 0.00 - 0.07 K/uL  ?  Comment: Performed at  [facility], [place] [address], [place], [place] [address]  ?I-stat chem 8, ED     Status: Abnormal  ? Collection Time: 09/27/2021  1:27 PM  ?Result Value Ref Range  ? Sodium 142 135 - 145 mmol/L  ? Potassium 4.8

## 2021-10-20 NOTE — Progress Notes (Signed)
LTM EEG hooked up and running - no initial skin breakdown - push button tested - neuro notified. [facility] monitoring.  

## 2021-10-20 NOTE — Progress Notes (Signed)
eLink Physician-Brief Progress Note ?Patient Name: [name] ?DOB: [birth year] ?MRN: [id number] ? ? ?Date of Service ? 09/26/2021  ?HPI/Events of Note ? 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  ?eICU Interventions ? Versed 2 mg iv stat and Q 5 minutes PRN myoclonic jerks, cEEG, Neurology assessment, will check a follow up ABG, patient has a pending MRI or the brain.  ? ? ? ?  ? ?Frederik Pear ?09/28/2021, 8:36 PM ?

## 2021-10-20 NOTE — H&P (Addendum)
09/25/2021 ? ?Attending Note ? ?Seen for coma and respiratory failure. ?CKD5 being monitored for ESRD initiation as OP who was found obtunded at home. ?Possible seizure like activity en route tx with benzo. ?Intubated on arrival to ER for airway protection ?Exam- appears dry, not much edema, does not respond to pain, LUE fistula with good thrill, on vent, NGT with dark output bilious vs. Bloody.  L knee bruised. ?CT head benign ?Labs c/w acute on chronic renal failure ?UA could be c/w UTI ?Will tx as UTI, acute on chronic renal failure, uremic/septic encephalopathy, r/o UGIB, r/o seizures. CTX, check lactate, EEG, gentle hydration until HD tonight. Continue vent support.  Updated daughter-in-law at bedside.  Texted son who is ortho doc here and let him know to reach out if any questions or concerns.Patient is full code. ? ?34 min cc time not including any separately billable procedures. ? ?Erskine Emery MD PCCM ? ? ? ?NAMEChianna Knapp, MRN:  [id number], DOB:  [birth year], LOS: 0 ?ADMISSION DATE:  10/11/2021,  ? ? ? ?CONSULTATION DATE:  10/19/2021 ?REFERRING MD:  Dr. Roslynn Amble, CHIEF COMPLAINT:  Unresponsive  ? ?History of Present Illness:  ?HPI obtained from medical chart review as patient is intubated and sedated on mechanical ventilation. ? ?81 year old female with prior medical history significant for but not limited to CKD stage IV, HTN, HLD, DMT2, and stroke who was found unresponsive at home by neighbor with snoring respirations and dried emesis; was normotensive, CBG 173, and pulse ox 98% on NRB.  Last seen on 3/23 without specific complaints. ?Witnessed seizure like activity on her left side enroute to ER which was treated with 2.'5mg'$  of versed by EMS with resolution.  On arrival to ER, she was intubated for airway protection given mental status.  Labs pending but significant thus far for istat> K 4.8, BUN> 130, sCr 10.50, iCa 1.03, Hgb 7.8 (baseline 7-8's).  Imaging pending.  EKG NSR with no acute findings, with  prolonged Qtc.  PCCM called for admit.  Nephrology consulted in ER.  ? ?Pertinent  Medical History  ?CKD stage IV (s/p left brachiocephalic fistula on 57/90/38- cleared for use 08/21/21), CAD, DMT2, HLD, HTN, R pontine stroke 2019, chronic anemia, macular degeneration, depression, anxiety, colon cancer, breast cancer, primary TB ? ?Significant Hospital Events: ?Including procedures, antibiotic start and stop dates in addition to other pertinent events   ?3/28 > admitted, intubated for airway protection, AKI ? ?Interim History / Subjective:  ?As above  ? ?Objective   ?Blood pressure (!) 159/81, pulse 94, temperature (!) 93 ?F (33.9 ?C), temperature source Core (Comment), resp. rate 18, SpO2 99 %. ?   ?Vent Mode: PRVC ?FiO2 (%):  [40 %-100 %] 40 % ?Set Rate:  [18 bmp-28 bmp] 28 bmp ?Vt Set:  [490 mL] 490 mL ?PEEP:  [5 cmH20] 5 cmH20 ?Plateau Pressure:  [14 cmH20] 14 cmH20  ?No intake or output data in the 24 hours ending 10/22/2021 1433 ?There were no vitals filed for this visit. ? ?Examination: ?General: Acute on chronically ill appearing elderly deconditioned female lying in bed on mechanical ventilation, in NAD ?HEENT: ETT, MM pink/moist, PERRL,  ?Neuro: Unresponsive  ?CV: s1s2 regular rate and rhythm, no murmur, rubs, or gallops,  ?PULM:  Clear to ascultation, tolerating, vent no increased work of breathing  ?GI: soft, bowel sounds active in all 4 quadrants, non-tender, non-distended, OG to LIWS ?Extremities: warm/dry, no edema  ?Skin: no rashes or lesions ? ?Resolved Hospital Problem list   ? ? ?  Assessment & Plan:  ?Acute Respiratory insufficiency with inability to protect airway   ?-Secondary to acute renal failure  ?P: ?Continue ventilator support with lung protective strategies  ?Wean PEEP and FiO2 for sats greater than 90%. ?Head of bed elevated 30 degrees. ?Plateau pressures less than 30 cm H20.  ?Follow intermittent chest x-ray and ABG.   ?SAT/SBT as tolerated, mentation preclude extubation  ?Ensure adequate  pulmonary hygiene  ?Follow cultures  ?VAP bundle in place  ?PAD protocol ?  ?Acute renal failure superimposed on CKD stage 4  ?-Baseline creatinine 2.2-2.8 with GFR 15-17, Creatinine on admit 10.50 ?-Left upper arm fistula in place since November 2022, cleared to use by vascular January 2023 ?P: ?Nephrology consulted, appreciate assistance  ?Follow renal function  ?Monitor urine output ?Trend Bmet ?Avoid nephrotoxins ?Ensure adequate renal perfusion  ?  ?Acute encephalopathy felt secondary to uremia  ?-Found unresponsive  ?P: ?Maintain neuro protective measures; goal for eurothermia, euglycemia, eunatermia, normoxia, and PCO2 goal of 35-40 ?Nutrition and bowel regiment  ?Seizure precautions  ?Aspirations precautions  ?Need to consider MRI if mentation doesn't improve with dialysis  ?  ?Concern for upper GI bleed  ?-Per EMS report dried vomit seen on screen, coffee ground stomach contents in canister on assessment  ?P: ?BID PPI  ?Follow CBC  ?May need to consider GI consultation  ?  ?Hx of diabetes  ?-Home medication includes; humalog 75/25 ?P: ?SSI  ?CBG q4hrs  ?CBG gal 140-180 ?  ?Hx of HTN/HLD ?-Home medications include; Lipitor, Hydralazine, Toprol-XL ?Prior stroke  ?P: ?Resume home medications when appropriate  ?Continuous telemetry  ? ?Best Practice (right click and "Reselect all SmartList Selections" daily)  ? ?Diet/type: NPO ?DVT prophylaxis: SCD ?GI prophylaxis: PPI ?Lines: N/A ?Foley:  N/A ?Code Status:  full code ?Last date of multidisciplinary goals of care discussion: Please seen sperate Plan of Care Note written 3/28 ? ?Labs   ?CBC: ?Recent Labs  ?Lab 10/16/2021 ?1320 10/08/2021 ?1327 10/15/2021 ?1402  ?WBC 18.6*  --   --   ?NEUTROABS 16.6*  --   --   ?HGB 8.8* 9.5* 7.8*  ?HCT 27.4* 28.0* 23.0*  ?MCV 104.6*  --   --   ?PLT 294  --   --   ? ? ?Basic Metabolic Panel: ?Recent Labs  ?Lab 10/17/2021 ?1327 10/05/2021 ?1402  ?NA 142 141  ?K 4.8 4.7  ?CL 113*  --   ?GLUCOSE 175*  --   ?BUN >130*  --   ?CREATININE 10.50*   --   ? ?GFR: ?CrCl cannot be calculated (Unknown ideal weight.). ?Recent Labs  ?Lab 09/26/2021 ?1320  ?WBC 18.6*  ? ? ?Liver Function Tests: ?No results for input(s): AST, ALT, ALKPHOS, BILITOT, PROT, ALBUMIN in the last 168 hours. ?No results for input(s): LIPASE, AMYLASE in the last 168 hours. ?No results for input(s): AMMONIA in the last 168 hours. ? ?ABG ?   ?Component Value Date/Time  ? PHART 7.142 (LL) 10/02/2021 1402  ? PCO2ART 34.6 10/14/2021 1402  ? PO2ART 560 (H) 10/15/2021 1402  ? HCO3 11.8 (L) 09/30/2021 1402  ? TCO2 13 (L) 10/11/2021 1402  ? ACIDBASEDEF 16.0 (H) 10/10/2021 1402  ? O2SAT 100 09/27/2021 1402  ?  ? ?Coagulation Profile: ?No results for input(s): INR, PROTIME in the last 168 hours. ? ?Cardiac Enzymes: ?No results for input(s): CKTOTAL, CKMB, CKMBINDEX, TROPONINI in the last 168 hours. ? ?HbA1C: ?Hgb A1c MFr Bld  ?Date/Time Value Ref Range Status  ?04/26/2020 05:00 AM 7.8 (H) 4.8 -  5.6 % Final  ?  Comment:  ?  (NOTE) ?Pre diabetes:          5.7%-6.4% ? ?Diabetes:              >6.4% ? ?Glycemic control for   <7.0% ?adults with diabetes ?  ?03/25/2018 02:47 AM 8.6 (H) 4.8 - 5.6 % Final  ?  Comment:  ?  (NOTE) ?Pre diabetes:          5.7%-6.4% ?Diabetes:              >6.4% ?Glycemic control for   <7.0% ?adults with diabetes ?  ? ? ?CBG: ?No results for input(s): GLUCAP in the last 168 hours. ? ?Review of Systems:   ?Unable to assess  ? ?Past Medical History:  ?She,  has a past medical history of Anxiety, Back ache, Breast cancer ([place]), Bronchitis, Cancer of colon ([place]), Carotid arterial disease ([place]), Chronic Dyspnea on exertion, CKD (chronic kidney disease), stage IV ([place]) (2017), Depression, DM (diabetes mellitus) ([place]), Family history of primary TB, Fracture of ankle, closed (2014), History of chicken pox, History of hysterectomy (04/1980), History of primary TB, Hyperlipidemia, Hypertension, Knee pain, right, Lung abnormality, Microalbuminuria, Muscle cramps, Muscular degeneration,  Nephrolithiasis, Palpitations, Personal history of radiation therapy, and Stroke ([place]).  ? ?Surgical History:  ? ?Past Surgical History:  ?Procedure Laterality Date  ? ABLATION  2007  ? fast heart rate  ? ANKLE FRAC

## 2021-10-20 NOTE — Procedures (Signed)
Patient Name: [name]  ?MRN: [id number]  ?Epilepsy Attending: Lora Havens  ?Referring Physician/Provider: Candee Furbish, MD ?Date: 10/08/2021 ?Duration: 23.31 mins ? ?Patient history: 81 year old woman with a history of carotid artery stenosis, CKD stage IV, diabetes, hypertension, hyperlipidemia, remote pontine stroke who was found unresponsive by a neighbor today. She did have reported focal motor seizure activity on L side en route with EMS. EEG to evaluate for seizure ? ?Level of alertness: comatose ? ?AEDs during EEG study: LEV ? ?Technical aspects: This EEG study was done with scalp electrodes positioned according to the 10-20 International system of electrode placement. Electrical activity was acquired at a sampling rate of '500Hz'$  and reviewed with a high frequency filter of '70Hz'$  and a low frequency filter of '1Hz'$ . EEG data were recorded continuously and digitally stored.  ? ?Description: EEG showed near continuous generalized and maximal left more than right parieto-occipital region 3 to 6 Hz theta-delta slowing. Brief 2-3 seconds of generalized eeg attenuation were also noted. Hyperventilation and photic stimulation were not performed.    ? ?ABNORMALITY ?- Continuous slow, generalized and maximal left more than right parieto-occipital region ? ?IMPRESSION: ?This study is suggestive of cortical dysfunction arising from eft more than right parieto-occipital region likely secondary to underlying structural abnormality( stroke, PRES) Additionally, there is severe diffuse encephalopathy, nonspecific etiology. No seizures or definite epileptiform discharges were seen throughout the recording. ? ?Lora Havens  ? ?

## 2021-10-20 NOTE — Progress Notes (Signed)
Responded to page to support patient and Daugther -in -Law. Chaplain escorted family to bedside for visit. Pt. was found down unresponsive. Pt is intubated and being admitted.  Chaplain provided emotional and spiritual support to pt and family. Chaplain available as needed. ? ?[name], [facility], Pager [phone]  ?

## 2021-10-20 NOTE — Progress Notes (Signed)
Patient transported to CT and back without complications. RN at bedside.  

## 2021-10-20 NOTE — ED Triage Notes (Signed)
Patient BIB [facility] from home, patient was found unresponsive by a neighbor. Per EMS has some seizure-like activity and was given 2.5 versed. Pt unresponsive to painful stimuli upon arrival to department. ?

## 2021-10-20 NOTE — ED Provider Notes (Signed)
?[facility] ?Provider Note ? ? ?CSN: [id number] ?Arrival date & time: 10/09/2021  1300 ? ?  ? ?History ? ?Chief Complaint  ?Patient presents with  ? unresponsive  ? ? ?[name] is a 81 y.o. female.  Presents to the emergency department with concern for unresponsiveness.  Patient was found unresponsive by a neighbor. ? ?History obtained from EMS report, they said possible seizure-like activity, gave 2.5 mg of Versed.  Unresponsive otherwise. ? ?History obtained from patient's daughter in law.  She states that patient was last seen well Thursday evening, not having appearing issues.  She does report that patient has known history of chronic kidney disease and recently underwent fistula placement for anticipated eventual need for dialysis.  States that patient was not answering phone calls and someone went to check on her today.  As far she is aware, patient is full code. ? ? ? ?HPI ? ?  ? ?Home Medications ?Prior to Admission medications   ?Medication Sig Start Date End Date Taking? Authorizing Provider  ?acetaminophen (TYLENOL) 325 MG tablet Take 2 tablets (650 mg total) by mouth every 4 (four) hours as needed for mild pain (or temp > 37.5 C (99.5 F)). 04/12/18  Yes Angiulli, Lavon Paganini, PA-C  ?ARIPiprazole (ABILIFY) 5 MG tablet Take 1 tablet (5 mg total) by mouth daily. 04/12/18  Yes Angiulli, Lavon Paganini, PA-C  ?atorvastatin (LIPITOR) 80 MG tablet Take 1 tablet (80 mg total) by mouth daily at 6 PM. ?Patient taking differently: Take 80 mg by mouth in the morning. 04/12/18  Yes Angiulli, Lavon Paganini, PA-C  ?beta carotene w/minerals (OCUVITE) tablet Take 1 tablet by mouth daily.   Yes [provider]  ?CALCIUM PO Take 1 tablet by mouth daily.   Yes [provider]  ?Cholecalciferol (QC VITAMIN D3) 50 MCG (2000 UT) TABS Take 2,000 Units by mouth in the morning.   Yes [provider]  ?clopidogrel (PLAVIX) 75 MG tablet Take 1 tablet (75 mg total) by mouth daily.  Restart on 06/13/21 06/11/21  Yes Baglia, Corrina, PA-C  ?furosemide (LASIX) 20 MG tablet Take 20 mg by mouth daily.   Yes [provider]  ?HUMALOG MIX 75/25 KWIKPEN (75-25) 100 UNIT/ML KwikPen Inject 44 Units into the skin in the morning and at bedtime. 03/14/21  Yes [provider]  ?hydrALAZINE (APRESOLINE) 25 MG tablet Take 25 mg by mouth 3 (three) times daily. 05/23/21  Yes [provider]  ?metoprolol succinate (TOPROL-XL) 50 MG 24 hr tablet Take 50 mg by mouth daily.   Yes [provider]  ?mirtazapine (REMERON) 15 MG tablet Take 1 tablet (15 mg total) by mouth at bedtime. 04/12/18  Yes Angiulli, Lavon Paganini, PA-C  ?Multiple Vitamin (MULTIVITAMIN WITH MINERALS) TABS tablet Take 1 tablet by mouth in the morning. Centrum Silver Women's Multivitamin   Yes [provider]  ?omega-3 acid ethyl esters (LOVAZA) 1 g capsule Take 2 g by mouth 2 (two) times daily.   Yes [provider]  ?senna (SENOKOT) 8.6 MG tablet Take 1 tablet by mouth at bedtime.   Yes [provider]  ?venlafaxine XR (EFFEXOR-XR) 150 MG 24 hr capsule Take 150 mg by mouth daily with breakfast. 05/12/18  Yes [provider]  ?BD PEN NEEDLE NANO 2ND GEN 32G X 4 MM MISC 1 each by Other route See admin instructions. Use with [place] daily. 07/24/19   [provider]  ?LORazepam (ATIVAN) 0.5 MG tablet Take 1 tablet (0.5 mg  total) by mouth every 6 (six) hours as needed for anxiety. ?Patient not taking: Reported on 10/02/2021 05/05/20   Aline August, MD  ?Roma Schanz test strip 1 each by Other route See admin instructions. Use 1 strip to test blood glucose twice daily. 08/16/18   [provider]  ?traMADol (ULTRAM) 50 MG tablet Take 1 tablet (50 mg total) by mouth every 6 (six) hours as needed. ?Patient not taking: Reported on 09/29/2021 06/11/21 06/11/22  Karoline Caldwell, PA-C  ?   ? ?Allergies    ?Lasix [furosemide], Lisinopril, and Other   ? ?Review of Systems    ?Review of Systems  ?Unable to perform ROS: Mental status change  ? ?Physical Exam ?Updated Vital Signs ?BP 134/66   Pulse 80   Temp (!) 93 ?F (33.9 ?C) (Core (Comment))   Resp (!) 28   SpO2 100%  ?Physical Exam ?Vitals and nursing note reviewed. Exam conducted with a chaperone present.  ?Constitutional:   ?   Comments: Unresponsive, poor respiratory effort, GCS 3  ?HENT:  ?   Head: Normocephalic and atraumatic.  ?   Nose: Nose normal.  ?   Mouth/Throat:  ?   Mouth: Mucous membranes are dry.  ?   Comments: Dried vomit around mouth ?Eyes:  ?   Comments: Pupils 4 mm equally  ?Cardiovascular:  ?   Rate and Rhythm: Normal rate.  ?   Pulses: Normal pulses.  ?Pulmonary:  ?   Comments: Agonal respirations, assisted via BVM ?Abdominal:  ?   General: Abdomen is flat.  ?   Tenderness: There is no abdominal tenderness. There is no guarding.  ?Musculoskeletal:     ?   General: No deformity or signs of injury.  ?Skin: ?   General: Skin is dry.  ?   Comments: Pale  ?Neurological:  ?   Comments: GCS 3  ? ? ?ED Results / Procedures / Treatments   ?Labs ?(all labs ordered are listed, but only abnormal results are displayed) ?Labs Reviewed  ?PROTIME-INR - Abnormal; Notable for the following components:  ?    Result Value  ? Prothrombin Time 18.3 (*)   ? INR 1.5 (*)   ? All other components within normal limits  ?CBC - Abnormal; Notable for the following components:  ? WBC 18.6 (*)   ? RBC 2.62 (*)   ? Hemoglobin 8.8 (*)   ? HCT 27.4 (*)   ? MCV 104.6 (*)   ? All other components within normal limits  ?DIFFERENTIAL - Abnormal; Notable for the following components:  ? Neutro Abs 16.6 (*)   ? Monocytes Absolute 1.2 (*)   ? Abs Immature Granulocytes 0.12 (*)   ? All other components within normal limits  ?RAPID URINE DRUG SCREEN, HOSP PERFORMED - Abnormal; Notable for the following components:  ? Benzodiazepines POSITIVE (*)   ? All other components within normal limits  ?URINALYSIS, ROUTINE W REFLEX MICROSCOPIC - Abnormal; Notable  for the following components:  ? Color, Urine AMBER (*)   ? APPearance TURBID (*)   ? Hgb urine dipstick LARGE (*)   ? Ketones, ur 5 (*)   ? Protein, ur 100 (*)   ? Leukocytes,Ua MODERATE (*)   ? WBC, UA >50 (*)   ? Bacteria, UA MANY (*)   ? Non Squamous Epithelial 6-10 (*)   ? All other components within normal limits  ?I-STAT CHEM 8, ED - Abnormal; Notable for the following components:  ? Chloride 113 (*)   ?  BUN >130 (*)   ? Creatinine, Ser 10.50 (*)   ? Glucose, Bld 175 (*)   ? Calcium, Ion 1.03 (*)   ? TCO2 11 (*)   ? Hemoglobin 9.5 (*)   ? HCT 28.0 (*)   ? All other components within normal limits  ?I-STAT ARTERIAL BLOOD GAS, ED - Abnormal; Notable for the following components:  ? pH, Arterial 7.142 (*)   ? pO2, Arterial 560 (*)   ? Bicarbonate 11.8 (*)   ? TCO2 13 (*)   ? Acid-base deficit 16.0 (*)   ? HCT 23.0 (*)   ? Hemoglobin 7.8 (*)   ? All other components within normal limits  ?RESP PANEL BY RT-PCR (FLU A&B, COVID) [id number]  ?CULTURE, BLOOD (ROUTINE X 2)  ?CULTURE, BLOOD (ROUTINE X 2)  ?MRSA NEXT GEN BY PCR, NASAL  ?ETHANOL  ?APTT  ?COMPREHENSIVE METABOLIC PANEL  ?MAGNESIUM  ?BLOOD GAS, ARTERIAL  ?CK  ?HEMOGLOBIN A1C  ?TYPE AND SCREEN  ?TROPONIN I (HIGH SENSITIVITY)  ?TROPONIN I (HIGH SENSITIVITY)  ? ? ?EKG ?None ? ?Radiology ?DG Chest Portable 1 View ? ?Result Date: 09/24/2021 ?CLINICAL DATA:  Intubated. EXAM: PORTABLE CHEST 1 VIEW COMPARISON:  04/27/2020 FINDINGS: Endotracheal tube with the tip 6.3 cm above the carina. Nasogastric tube coursing below the diaphragm. No focal consolidation. No pleural effusion or pneumothorax. Heart and mediastinal contours are unremarkable. No acute osseous abnormality. IMPRESSION: 1. Support lines and tubing in satisfactory position. 2. No acute cardiopulmonary disease. Electronically Signed   By: Kathreen Devoid M.D.   On: 10/19/2021 14:25  ? ?DG Knee Left Port ? ?Result Date: 10/06/2021 ?CLINICAL DATA:  Unwitnessed fall. EXAM: PORTABLE LEFT KNEE - 1-2 VIEW COMPARISON:   None. FINDINGS: The joint spaces are fairly well maintained for age. No acute fracture or osteochondral lesion. There is a small suprapatellar knee joint effusion noted. Moderate vascular calcifications. IMPRE

## 2021-10-20 NOTE — H&P (Signed)
Please see progress note by Gerald Leitz dated today. ? ?Erskine Emery MD PCCM ?

## 2021-10-21 ENCOUNTER — Inpatient Hospital Stay (HOSPITAL_COMMUNITY): Payer: Medicare HMO

## 2021-10-21 DIAGNOSIS — R0609 Other forms of dyspnea: Secondary | ICD-10-CM

## 2021-10-21 DIAGNOSIS — R579 Shock, unspecified: Secondary | ICD-10-CM

## 2021-10-21 DIAGNOSIS — J9601 Acute respiratory failure with hypoxia: Secondary | ICD-10-CM | POA: Diagnosis not present

## 2021-10-21 DIAGNOSIS — G9341 Metabolic encephalopathy: Secondary | ICD-10-CM

## 2021-10-21 DIAGNOSIS — G931 Anoxic brain damage, not elsewhere classified: Secondary | ICD-10-CM

## 2021-10-21 DIAGNOSIS — R4189 Other symptoms and signs involving cognitive functions and awareness: Secondary | ICD-10-CM | POA: Diagnosis not present

## 2021-10-21 DIAGNOSIS — N170 Acute kidney failure with tubular necrosis: Secondary | ICD-10-CM | POA: Diagnosis not present

## 2021-10-21 DIAGNOSIS — N179 Acute kidney failure, unspecified: Secondary | ICD-10-CM | POA: Diagnosis not present

## 2021-10-21 LAB — BLOOD CULTURE ID PANEL (REFLEXED) - BCID2

## 2021-10-21 LAB — ECHOCARDIOGRAM COMPLETE
AR max vel: 1.69 cm2
AV Area VTI: 1.98 cm2
AV Area mean vel: 1.74 cm2
AV Mean grad: 5 mmHg
AV Peak grad: 10.4 mmHg
Ao pk vel: 1.61 m/s
Area-P 1/2: 9.73 cm2
S' Lateral: 3.1 cm
Weight: 2797.2 oz

## 2021-10-21 LAB — CBC
HCT: 24.1 % — ABNORMAL LOW (ref 36.0–46.0)
Hemoglobin: 8 g/dL — ABNORMAL LOW (ref 12.0–15.0)
MCH: 32.8 pg (ref 26.0–34.0)
MCHC: 33.2 g/dL (ref 30.0–36.0)
MCV: 98.8 fL (ref 80.0–100.0)
Platelets: 261 10*3/uL (ref 150–400)
RBC: 2.44 MIL/uL — ABNORMAL LOW (ref 3.87–5.11)
RDW: 13.3 % (ref 11.5–15.5)
WBC: 13.5 10*3/uL — ABNORMAL HIGH (ref 4.0–10.5)
nRBC: 0 % (ref 0.0–0.2)

## 2021-10-21 LAB — BASIC METABOLIC PANEL
Anion gap: 22 — ABNORMAL HIGH (ref 5–15)
BUN: 162 mg/dL — ABNORMAL HIGH (ref 8–23)
CO2: 12 mmol/L — ABNORMAL LOW (ref 22–32)
Calcium: 8 mg/dL — ABNORMAL LOW (ref 8.9–10.3)
Chloride: 111 mmol/L (ref 98–111)
Creatinine, Ser: 10.1 mg/dL — ABNORMAL HIGH (ref 0.44–1.00)
GFR, Estimated: 4 mL/min — ABNORMAL LOW (ref >60)
Glucose, Bld: 143 mg/dL — ABNORMAL HIGH (ref 70–99)
Potassium: 4.7 mmol/L (ref 3.5–5.1)
Sodium: 145 mmol/L (ref 135–145)

## 2021-10-21 LAB — GLUCOSE, CAPILLARY
Glucose-Capillary: 119 mg/dL — ABNORMAL HIGH (ref 70–99)
Glucose-Capillary: 143 mg/dL — ABNORMAL HIGH (ref 70–99)
Glucose-Capillary: 129 mg/dL — ABNORMAL HIGH (ref 70–99)
Glucose-Capillary: 146 mg/dL — ABNORMAL HIGH (ref 70–99)
Glucose-Capillary: 144 mg/dL — ABNORMAL HIGH (ref 70–99)

## 2021-10-21 LAB — HEPATITIS B SURFACE ANTIGEN: Hepatitis B Surface Ag: NONREACTIVE

## 2021-10-21 LAB — MAGNESIUM: Magnesium: 2.2 mg/dL (ref 1.7–2.4)

## 2021-10-21 LAB — PROCALCITONIN: Procalcitonin: 4.65 ng/mL

## 2021-10-21 LAB — PHOSPHORUS: Phosphorus: 10.5 mg/dL — ABNORMAL HIGH (ref 2.5–4.6)

## 2021-10-21 LAB — HEPATITIS B SURFACE ANTIBODY,QUALITATIVE: Hep B S Ab: NONREACTIVE

## 2021-10-21 MED ORDER — PRISMASOL BGK 4/2.5 32-4-2.5 MEQ/L REPLACEMENT SOLN
Status: DC
  Filled 2021-10-21 (×3): qty 5000

## 2021-10-21 MED ORDER — PROPOFOL 1000 MG/100ML IV EMUL
5.0000 ug/kg/min | INTRAVENOUS | Status: DC
  Administered 2021-10-21: 10 ug/kg/min via INTRAVENOUS
  Administered 2021-10-21 – 2021-10-22 (×2): 20 ug/kg/min via INTRAVENOUS
  Administered 2021-10-22: 30 ug/kg/min via INTRAVENOUS
  Filled 2021-10-21 (×3): qty 100

## 2021-10-21 MED ORDER — PANTOPRAZOLE 2 MG/ML SUSPENSION
40.0000 mg | Freq: Every day | ORAL | Status: DC
  Administered 2021-10-22: 40 mg
  Filled 2021-10-21 (×2): qty 20

## 2021-10-21 MED ORDER — PERFLUTREN LIPID MICROSPHERE
1.0000 mL | INTRAVENOUS | Status: AC | PRN
  Administered 2021-10-21: 2 mL via INTRAVENOUS
  Filled 2021-10-21: qty 10

## 2021-10-21 MED ORDER — NOREPINEPHRINE 4 MG/250ML-% IV SOLN
INTRAVENOUS | Status: AC
  Filled 2021-10-21: qty 250

## 2021-10-21 MED ORDER — PHENYLEPHRINE HCL-NACL 20-0.9 MG/250ML-% IV SOLN
INTRAVENOUS | Status: AC
  Filled 2021-10-21: qty 250

## 2021-10-21 MED ORDER — PHENYLEPHRINE HCL-NACL 20-0.9 MG/250ML-% IV SOLN
INTRAVENOUS | Status: AC
  Administered 2021-10-21: 25 ug/min via INTRAVENOUS
  Filled 2021-10-21: qty 250

## 2021-10-21 MED ORDER — LACTATED RINGERS IV BOLUS
1000.0000 mL | Freq: Once | INTRAVENOUS | Status: AC
  Administered 2021-10-21: 1000 mL via INTRAVENOUS

## 2021-10-21 MED ORDER — PHENYLEPHRINE CONCENTRATED 100MG/250ML (0.4 MG/ML) INFUSION SIMPLE
0.0000 ug/min | INTRAVENOUS | Status: DC
  Administered 2021-10-21: 120 ug/min via INTRAVENOUS
  Administered 2021-10-22: 260 ug/min via INTRAVENOUS
  Administered 2021-10-22: 220 ug/min via INTRAVENOUS
  Administered 2021-10-23: 260 ug/min via INTRAVENOUS
  Filled 2021-10-21 (×8): qty 250

## 2021-10-21 MED ORDER — HEPARIN SODIUM (PORCINE) 1000 UNIT/ML DIALYSIS
1000.0000 [IU] | INTRAMUSCULAR | Status: DC | PRN
  Administered 2021-10-22: 3000 [IU] via INTRAVENOUS_CENTRAL
  Filled 2021-10-21: qty 3
  Filled 2021-10-21 (×2): qty 6

## 2021-10-21 MED ORDER — SODIUM CHLORIDE 0.9 % FOR CRRT: 500.0000 mL | INTRAVENOUS_CENTRAL | Status: DC | PRN

## 2021-10-21 MED ORDER — PHENYLEPHRINE CONCENTRATED 100MG/250ML (0.4 MG/ML) INFUSION SIMPLE
25.0000 ug/min | INTRAVENOUS | Status: DC
  Filled 2021-10-21: qty 250

## 2021-10-21 MED ORDER — HEPARIN SODIUM (PORCINE) 5000 UNIT/ML IJ SOLN
5000.0000 [IU] | Freq: Three times a day (TID) | INTRAMUSCULAR | Status: DC
  Administered 2021-10-21 – 2021-10-22 (×3): 5000 [IU] via SUBCUTANEOUS
  Filled 2021-10-21 (×3): qty 1

## 2021-10-21 MED ORDER — ACETAMINOPHEN 325 MG PO TABS
650.0000 mg | ORAL_TABLET | Freq: Four times a day (QID) | ORAL | Status: DC | PRN
  Administered 2021-10-21: 650 mg
  Filled 2021-10-21: qty 2

## 2021-10-21 MED ORDER — PRISMASOL BGK 4/2.5 32-4-2.5 MEQ/L EC SOLN
Status: DC
  Filled 2021-10-21 (×9): qty 5000

## 2021-10-21 MED ORDER — SODIUM CHLORIDE 0.9 % IV SOLN
250.0000 mL | INTRAVENOUS | Status: DC
  Administered 2021-10-21: 250 mL via INTRAVENOUS

## 2021-10-21 MED ORDER — PHENYLEPHRINE HCL-NACL 20-0.9 MG/250ML-% IV SOLN
25.0000 ug/min | INTRAVENOUS | Status: DC
  Administered 2021-10-21: 100 ug/min via INTRAVENOUS
  Administered 2021-10-21: 80 ug/min via INTRAVENOUS
  Administered 2021-10-21 (×2): 100 ug/min via INTRAVENOUS
  Administered 2021-10-21: 80 ug/min via INTRAVENOUS
  Filled 2021-10-21 (×5): qty 250

## 2021-10-21 NOTE — Progress Notes (Signed)
Family would like to try crrt. Will place orders. Ccm to help with access. Appreciate help

## 2021-10-21 NOTE — Progress Notes (Signed)
LTM maint complete - no skin breakdown at fp1, fp2, f8 ?

## 2021-10-21 NOTE — Progress Notes (Signed)
? ? ?NAME:  [name], MRN:  [id number], DOB:  1940-08-20, LOS: 1 ?ADMISSION DATE:  09/23/2021,  ? ? ? ?CONSULTATION DATE:  10/04/2021 ?REFERRING MD:  Dr. Roslynn Amble, CHIEF COMPLAINT:  Unresponsive  ? ?History of Present Illness:  ?HPI obtained from medical chart review as patient is intubated and sedated on mechanical ventilation. ? ?81 year old female with prior medical history significant for but not limited to CKD stage IV, HTN, HLD, DMT2, and stroke who was found unresponsive at home by neighbor with snoring respirations and dried emesis; was normotensive, CBG 173, and pulse ox 98% on NRB.  Last seen on 3/23 without specific complaints. ?Witnessed seizure like activity on her left side enroute to ER which was treated with 2.'5mg'$  of versed by EMS with resolution.  On arrival to ER, she was intubated for airway protection given mental status.  Labs pending but significant thus far for istat> K 4.8, BUN> 130, sCr 10.50, iCa 1.03, Hgb 7.8 (baseline 7-8's).  Imaging pending.  EKG NSR with no acute findings, with prolonged Qtc.  PCCM called for admit.  Nephrology consulted in ER.  ? ?Pertinent  Medical History  ?CKD stage IV (s/p left brachiocephalic fistula on 42/70/62- cleared for use 08/21/21), CAD, DMT2, HLD, HTN, R pontine stroke 2019, chronic anemia, macular degeneration, depression, anxiety, colon cancer, breast cancer, primary TB ? ?Significant Hospital Events: ?Including procedures, antibiotic start and stop dates in addition to other pertinent events   ?3/28 > admitted, intubated for airway protection, AKI ?3/29 Brief HD overnight. MRI concerning for anoxic brain injury, cortical and subcortical infarcts in both hemispheres, and extensive petechial/microhemorrhage. ? ?Interim History / Subjective:  ?Became hypotensive overnight and started on pressors ? ?MRI concerning for anoxic brain injury, cortical and subcortical infarcts in both hemispheres, and extensive petechial/microhemorrhage. ? ?EEG concerning  anoxic/hypoxic brain injury for possible myoclonus on camera ? ?60 phenylephrine ? ?Unable to obtain subjective evaluation due to patient status ? ? ?Objective   ?Blood pressure (!) 101/58, pulse (!) 106, temperature 99.1 ?F (37.3 ?C), resp. rate (!) 32, weight 79.3 kg, SpO2 96 %. ?   ?Vent Mode: PRVC ?FiO2 (%):  [40 %-100 %] 40 % ?Set Rate:  [18 bmp-28 bmp] 28 bmp ?Vt Set:  [490 mL] 490 mL ?PEEP:  [5 cmH20] 5 cmH20 ?Plateau Pressure:  [14 cmH20-15 cmH20] 15 cmH20  ? ?Intake/Output Summary (Last 24 hours) at 10/21/2021 3762 ?Last data filed at 10/21/2021 0700 ?Gross per 24 hour  ?Intake 1095.19 ml  ?Output -185 ml  ?Net 1280.19 ml  ? ?Filed Weights  ? 10/21/21 0200 10/21/21 0430  ?Weight: 79.3 kg 79.3 kg  ? ? ?Examination: ?General: In bed, chronically ill appearing,  ?HEENT: MM pink/moist, anicteric, atraumatic ?Neuro: RASS -5, PERRL 59m, GCS 4t, no eye opening, slight flex to pain, +C/C/G, twitching to left shoulder  ?CV: S1S2, ST, no m/r/g appreciated ?PULM:  Air movement in all lobes, trachea midline, chest expansion symmetric ?GI: soft, bsx4 active, non-tender   ?Extremities: warm/dry, no pretibial edema, capillary refill less than 3 seconds  ?Skin:  no rashes or lesions noted ? ?Labs ?WBC 18.6> 13.5 ?HGB 7.8>8.2>8.0 ?BUN 162, Creat 10.10, AG 22 ?Phos 10.5 ?MRI concerning for anoxic brain injury, cortical and subcortical infarcts in both hemispheres, and extensive petechial/microhemorrhage. ?EEG concerning anoxic/hypoxic brain injury for possible myoclonus on camera ? ?Resolved Hospital Problem list   ? ? ?Assessment & Plan:  ?Acute Respiratory failure with hypoxia. ?? Secondary to acute renal failure. ABG on 3/28  7.22/24/49/10.2 ?P: ?-LTVV strategy with tidal volumes of 4-8 cc/kg ideal body weight ?-Goal plateau pressures less than 30 and driving pressures less than 15 ?-Wean PEEP/FiO2 for SpO2 92-98% ?-VAP bundle ?-Mental status precludes extubation at this time ?-RASS goal 0 to -1 ?-Follow intermittent CXR and  ABG PRN ? ?Anoxic brain injury suspect secondary to ventilatory compromise while down. MRI concerning for anoxic brain injury, cortical and subcortical infarcts in both hemispheres, and extensive petechial/microhemorrhage. ?Acute metabolic encephalopathy secondary to uremia ?-Found unresponsive  ?P: ?-Neuro following, appreciate assistance ?-Continue neuroprotective measures- normothermia, euglycemia, HOB greater than 30, head in neutral alignment, normocapnia, normoxia.  ?-Seizure precautions, AEDs per neuro ?-PRN benzo for seizure ?-Dr. Tamala Julian, Dr. Quinn Axe, and Dr. Jonnie Finner discussed CRRT in light of recent MRI findings at bedside with son Dr. Lynann Bologna. In the setting of the patients neurologic injury Dr. Lynann Bologna would not like to pursue CRRT at this time. Son to talk to family regarding comfort focused care at this time. ? ?Shock, ? hypovolemic vs septic ??UTI on admission, appears dry on exam, started on neo overnight. WBC 18.6> 13.5 ?-1L LR now, holding on further fluid admin in setting of CKD ?-Continue ceftriaxone ?-Follow up cultures ?-Goal MAP 60-65. Titrate phenylephrine to goal ? ?Acute renal failure superimposed on CKD stage 4  ?Baseline creatinine 2.2-2.8 with GFR 15-17, Creatinine on admit 10.50. Left upper arm fistula in place since November 2022, cleared to use by vascular January 2023. Brief HD on 3/29. ?P: ?-Ensure renal perfusion. Goal MAP 65 or greater. ?-Avoid neprotoxic drugs as possible. ?-Strict I&O's ?-Follow up AM creatinine ?-Neph following ?-No CRRT at this time ?  ?Concern for upper GI bleed  ?Per EMS report dried vomit seen on screen, coffee ground stomach contents in canister on assessment.  HBG Stable HGB 7.8>8.2>8.0 ?P: ?-Monitor ?-Continue BID PPI  ?-Trend CBC ?  ?Hx of diabetes  ?Home medication includes; humalog 75/25 ?P: ?-Blood Glucose goal 140-180. ?-SSI ?  ?Hx of HTN/HLD ?-Home medications include; Lipitor, Hydralazine, Toprol-XL ?Prior stroke  ?P: ?-Resume home medications  when appropriate. ?-Continuous tele ? ?GOC: Dr. Tamala Julian, Dr. Quinn Axe, and Dr. Jonnie Finner updated son, Dr. Lynann Bologna, at bedside regarding current exam, MRI, and laboratory findings. After discussions with Dr. Quinn Axe (neuro) and Dr. Jonnie Finner (Neph) CRRT deferred by son due to neurologic injury present on MRI. Family meeting to discuss Gordon at this time.  ? ?Best Practice (right click and "Reselect all SmartList Selections" daily)  ? ?Diet/type: NPO ?DVT prophylaxis: SCD ?GI prophylaxis: PPI ?Lines: N/A ?Foley:  N/A ?Code Status:  full code ?Last date of multidisciplinary goals of care discussion: Please seen sperate Plan of Care Note written 3/28 ? ?Critical care time: 40 minutes  ? ? ?Redmond School., MSN, APRN, AGACNP-BC ?[facility]  ?10/21/2021 , 9:27 AM ? ?Please see [email or web address] for pager details ? ?If no response, please call [phone] ?After hours, please call Elink at [phone] ? ? ? ? ? ? ? ? ?

## 2021-10-21 NOTE — Progress Notes (Signed)
S: Patient underwent HD last night. Off sedation this AM she has brainstem reflexes intact but does not follow commands. Became hypotensive overnight requiring pressors. ? ?MRI brain wo contrast - extensive cortical and subcortical infarcts bilaterally in anterior and posterior circulation, restricted diffusion in bilat GP with sparing of other deep nuclei. (+) confluent extensive petechial hemorrhage throughout the brain, in areas of restricted diffusion but also other areas progressed since 2019 MRI potentially c/w amyloid. Some vasogenic edema in R parieto-occipital region assoc w/ blood products but no malignant hemorrhagic transformation or significant mass effect. MRI brain personally reviewed. ? ?cEEG: continuous diffuse slowing with superimposed focal slowing L>R parieto-occipital regions. Generalized periodic triphasics 0.5-1 Hz ? ?O: ? ?Vitals:  ? 10/21/21 2034 10/21/21 2035  ?BP:    ?Pulse: 97   ?Resp: (!) 25   ?Temp: 100 ?F (37.8 ?C)   ?SpO2: 100% 100%  ? ?Gen: comatose, off sedation ?Resp: ventilated ?CV: RRR ? ?Neurologic examination: ?Mental status: no response to noxious stimuli, does not follow commands ?Speech: intubated ?CN: PERRL, (+) corneals, oculocephalics, cough, gag ?Motor & sensory: no response to noxious stimuli ?Reflexes: 1+ throughout, toes mute bilat ? ?A/P: This is an 81 year old woman with a history of carotid artery stenosis, CKD stage IV, diabetes, hypertension, hyperlipidemia, remote pontine stroke who was found unresponsive after last seen well 5 days prior. Creatinine 9.46 w/ BUN 150 on admission. Her MRI brain shows extensive ischemic infarcts bilaterally, cortical and subcortical, including bilat GP and posterior circulation. Appearance most c/w severe anoxic brain injury given hx. Based on these findings her prognosis for a meaningful neurologic recovery is very poor. I had multiple discussions with her son Dr. Lynann Bologna today. Family is considering comfort care vs trial of  CRRT. Will continue to follow. If patient decompensates, recommend STAT head CT noncon r/o frank hemorrhagic conversion or mass effect. ? ?This patient is critically ill and at significant risk of neurological worsening, death and care requires constant monitoring of vital signs, hemodynamics,respiratory and cardiac monitoring, neurological assessment, discussion with family, other specialists and medical decision making of high complexity. I spent 65 minutes of neurocritical care time  in the care of  this patient. This was time spent independent of any time provided by nurse practitioner or PA. ? ?Su Monks, MD ?[facility] ?[phone] ? ?If 7pm- 7am, please page neurology on call as listed in [place]. ? ?

## 2021-10-21 NOTE — Progress Notes (Addendum)
eLink Physician-Brief Progress Note ?Patient Name: [name] ?DOB: [birth year] ?MRN: [id number] ? ? ?Date of Service ? 10/21/2021  ?HPI/Events of Note ? Received request for tylenol for fever.  ? ?80/F with anoxic brain injury, AKI on CKD, already on Ceftriaxone for probable UTI.  ?eICU Interventions ? Tylenol ordered PRN.  ? ? ? ?Intervention Category ?Minor Interventions: Other: ? ?Elsie Lincoln ?10/21/2021, 8:37 PM ? ?6:09 AM ?Notified by pharmacy that culture grew Klebsiella pneumoniae. Pt is on Ceftriaxone.  ? ?Plan> ?Continue antibiotics.  ?

## 2021-10-21 NOTE — Procedures (Addendum)
Patient Name: [name]  ?MRN: [id number]  ?Epilepsy Attending: Lora Havens  ?Referring Physician/Provider: Donnetta Simpers, MD ?Duration: 10/02/2021 2149 to 10/21/2021 2149 ? ?Patient history: 81 year old woman with a history of carotid artery stenosis, CKD stage IV, diabetes, hypertension, hyperlipidemia, remote pontine stroke who was found unresponsive by a neighbor today. She did have reported focal motor seizure activity on L side en route with EMS. EEG to evaluate for seizure ?  ?Level of alertness: comatose ?  ?AEDs during EEG study: LEV, versed ?  ?Technical aspects: This EEG study was done with scalp electrodes positioned according to the 10-20 International system of electrode placement. Electrical activity was acquired at a sampling rate of '500Hz'$  and reviewed with a high frequency filter of '70Hz'$  and a low frequency filter of '1Hz'$ . EEG data were recorded continuously and digitally stored.  ?  ?Description: EEG showed near continuous generalized and maximal left more than right parieto-occipital region 3 to 6 Hz theta-delta slowing.  Generalized periodic discharges with reactive morphology were also noted at 0.5 to 1 Hz.  Brief 2-3 seconds of generalized eeg attenuation were also noted. Hyperventilation and photic stimulation were not performed.   ? ?Patient was noted to have jerking of left shoulder intermittently throughout the study. Concomitant EEG before, during and after the event did not show any EEG change. ?  ?ABNORMALITY ?- Continuous slow, generalized and maximal left more than right parieto-occipital region ?-Periodic discharges with triphasic morphology, generalized ?  ?IMPRESSION: ?This study is suggestive of cortical dysfunction arising from left more than right parieto-occipital region likely secondary to underlying structural abnormality.   ? ?Generalized periodic discharges with triphasic morphology were also noted at 0.5 to 1 Hz.  This EEG pattern can be on the ictal-interictal  continuum.  However, the morphology and frequency is more commonly indicative of toxic-metabolic causes, anoxic/hypoxic brain injury.  Additionally, there is severe diffuse encephalopathy, nonspecific etiology.  ? ?Patient was noted to have episode of left shoulder jerking without concomitant EEG change.  The semiology of the episode is suggestive of myoclonus. However, focal motor seizures may not be seen on scalp EEG.  Clinical correlation is recommended. ?  ?Lora Havens  ?  ?

## 2021-10-21 NOTE — Progress Notes (Signed)
HD #1 today. First use of left upper arm AV fistula cannulated successfully using two 17 g needles at prescribed BFR of 200. ?

## 2021-10-21 NOTE — Progress Notes (Signed)
PHARMACY - PHYSICIAN COMMUNICATION ?CRITICAL VALUE ALERT - BLOOD CULTURE IDENTIFICATION (BCID) ? ?[name] is an 81 y.o. female who presented to [facility] on 09/26/2021 with a chief complaint of seizures, possible anoxic brain injury. ? ?Assessment:  1/3 staph epidermidis - likely contaminant  ? ?Name of physician (or Provider) Contacted: Dr. Tamala Julian ? ?Current antibiotics: Ceftriaxone ? ?Changes to prescribed antibiotics recommended:  ?No Change ? ?Results for orders placed or performed during the hospital encounter of 10/07/2021  ?Blood Culture ID Panel (Reflexed) (Collected: 10/13/2021  3:46 PM)  ?Result Value Ref Range  ? Enterococcus faecalis NOT DETECTED NOT DETECTED  ? Enterococcus Faecium NOT DETECTED NOT DETECTED  ? Listeria monocytogenes NOT DETECTED NOT DETECTED  ? Staphylococcus species DETECTED (A) NOT DETECTED  ? Staphylococcus aureus (BCID) NOT DETECTED NOT DETECTED  ? Staphylococcus epidermidis DETECTED (A) NOT DETECTED  ? Staphylococcus lugdunensis NOT DETECTED NOT DETECTED  ? Streptococcus species NOT DETECTED NOT DETECTED  ? Streptococcus agalactiae NOT DETECTED NOT DETECTED  ? Streptococcus pneumoniae NOT DETECTED NOT DETECTED  ? Streptococcus pyogenes NOT DETECTED NOT DETECTED  ? A.calcoaceticus-baumannii NOT DETECTED NOT DETECTED  ? Bacteroides fragilis NOT DETECTED NOT DETECTED  ? Enterobacterales NOT DETECTED NOT DETECTED  ? Enterobacter cloacae complex NOT DETECTED NOT DETECTED  ? Escherichia coli NOT DETECTED NOT DETECTED  ? Klebsiella aerogenes NOT DETECTED NOT DETECTED  ? Klebsiella oxytoca NOT DETECTED NOT DETECTED  ? Klebsiella pneumoniae NOT DETECTED NOT DETECTED  ? Proteus species NOT DETECTED NOT DETECTED  ? Salmonella species NOT DETECTED NOT DETECTED  ? Serratia marcescens NOT DETECTED NOT DETECTED  ? Haemophilus influenzae NOT DETECTED NOT DETECTED  ? Neisseria meningitidis NOT DETECTED NOT DETECTED  ? Pseudomonas aeruginosa NOT DETECTED NOT DETECTED  ? Stenotrophomonas  maltophilia NOT DETECTED NOT DETECTED  ? Candida albicans NOT DETECTED NOT DETECTED  ? Candida auris NOT DETECTED NOT DETECTED  ? Candida glabrata NOT DETECTED NOT DETECTED  ? Candida krusei NOT DETECTED NOT DETECTED  ? Candida parapsilosis NOT DETECTED NOT DETECTED  ? Candida tropicalis NOT DETECTED NOT DETECTED  ? Cryptococcus neoformans/gattii NOT DETECTED NOT DETECTED  ? Methicillin resistance mecA/C NOT DETECTED NOT DETECTED  ? ? ?Laurey Arrow, PharmD ?PGY1 Pharmacy Resident ?10/21/2021  3:44 PM ? ?Please check [email or web address] for unit-specific pharmacy phone numbers. ? ? ?

## 2021-10-21 NOTE — Procedures (Signed)
Central Venous Catheter Insertion Procedure Note ? ?[name]  ?[id number]  ?04-21-1941 ? ?Date:10/21/21  ?Time:10:35 PM  ? ?Provider Performing:[name]  ? ?Procedure: Insertion of Non-tunneled Central Venous Catheter(36556)with US guidance (29798)   ? ?Indication(s) ?Hemodialysis ? ?Consent ?Risks of the procedure as well as the alternatives and risks of each were explained to the patient and/or caregiver.  Consent for the procedure was obtained and is signed in the bedside chart ? ?Anesthesia ?Topical only with 1% lidocaine  ? ?Timeout ?Verified patient identification, verified procedure, site/side was marked, verified correct patient position, special equipment/implants available, medications/allergies/relevant history reviewed, required imaging and test results available. ? ?Sterile Technique ?Maximal sterile technique including full sterile barrier drape, hand hygiene, sterile gown, sterile gloves, mask, hair covering, sterile ultrasound probe cover (if used). ? ?Procedure Description ?Area of catheter insertion was cleaned with chlorhexidine and draped in sterile fashion.   With real-time ultrasound guidance a HD catheter was placed into the right internal jugular vein.  Nonpulsatile blood flow and easy flushing noted in all ports.  The catheter was sutured in place and sterile dressing applied. ? ?Complications/Tolerance ?None; patient tolerated the procedure well. ?Chest X-ray is ordered to verify placement for internal jugular or subclavian cannulation.  Chest x-ray is not ordered for femoral cannulation. ? ?EBL ?Minimal ? ?Specimen(s) ?None ? ? ?Makayla Hora, PA - C ?[facility] ?For pager details, please see AMION or use Epic chat  ?After 1900, please call [facility] for cross coverage needs ?10/21/2021, 10:36 PM ? ? ?

## 2021-10-21 NOTE — Progress Notes (Signed)
Subjective:  remains intubated in ICU-  low grade temp-  had her first HD in the middle of the night-  BUN was 809 prior-  complicated by some hypotension-req pressors- still on.  Neurology involved-  EEG suggestive of CVA-  MRI " extensive cortical and subcortical infarcts in both hemispheres, extensiive microhemorrhage"  is having some jerking ? ?Objective ?Vital signs in last 24 hours: ?Vitals:  ? 10/21/21 0435 10/21/21 0443 10/21/21 0445 10/21/21 0500  ?BP: (!) 85/51 (!) 107/49 (!) 95/49 105/69  ?Pulse: 95 98 92 (!) 105  ?Resp: (!) 25 (!) 28 (!) 25 (!) 23  ?Temp: 98.6 ?F (37 ?C) 98.6 ?F (37 ?C) 98.6 ?F (37 ?C)   ?TempSrc:      ?SpO2: 100% 100% 100% 100%  ?Weight:      ? ?Weight change:  ? ?Intake/Output Summary (Last 24 hours) at 10/21/2021 0713 ?Last data filed at 10/21/2021 0500 ?Gross per 24 hour  ?Intake 1003.73 ml  ?Output -185 ml  ?Net 1188.73 ml  ? ? ?Assessment/Plan: [name]-  multiple medical problems including advanced CKD-  found down  ?1.Renal- advanced CKD at baseline-  AVF is in place and ready-  last evald by me on 3/16-  no indications-  A on CRF -  could be contributing but not sure completely responsible for condition.  Nursing remarked that the jerking was better after HD but returned.  The most efficient way to clear her uremia would be with CRRT-  she would need access for that.  In light of what MRI showed is unclear if will have significant impact unfortunately-  will allow CCM and neurology to speak with family and then let me know if CRRT is desired  ?2. Hypertension/volume  - not significantly volume overloaded-  no removal with HD-  actually think she needs a little volume today -  have d/w CCM ?3. Elytes-  k is OK ?4. Anemia  - contradicting values-  from 7.8 to 9.5-  was in the 9's when I saw her on 3/16-  will treat with ESA and iron if aggressive care is continued  ?5.  Unresponsive-  so far HCT negative for bleed- drug screen and troponin pending- ? Infection ? Work up per  [employer].  Now with MRI and EEG with not so promising findings.  I have offered CRRT to clear uremia faster but not sure if will have big impact on outcome-  would need neurology opinion  ? ? ?Louis Meckel  ? ? ?Labs: ?Basic Metabolic Panel: ?Recent Labs  ?Lab 09/29/2021 ?1320 09/29/2021 ?1327 10/11/2021 ?1402 10/22/2021 ?2107 10/21/21 ?0200  ?NA 144 142 141 142 145  ?K 5.0 4.8 4.7 4.8 4.7  ?CL 112* 113*  --   --  111  ?CO2 8*  --   --   --  12*  ?GLUCOSE 177* 175*  --   --  143*  ?BUN 150* >130*  --   --  162*  ?CREATININE 9.46* 10.50*  --   --  10.10*  ?CALCIUM 8.2*  --   --   --  8.0*  ?PHOS  --   --   --   --  10.5*  ? ?Liver Function Tests: ?Recent Labs  ?Lab 10/05/2021 ?1320  ?AST 100*  ?ALT 65*  ?ALKPHOS 97  ?BILITOT 1.5*  ?PROT 5.2*  ?ALBUMIN 2.3*  ? ?No results for input(s): LIPASE, AMYLASE in the last 168 hours. ?No results for input(s): AMMONIA in the last  168 hours. ?CBC: ?Recent Labs  ?Lab 10/06/2021 ?1320 10/11/2021 ?1327 09/24/2021 ?1402 10/03/2021 ?2107 10/21/21 ?0200  ?WBC 18.6*  --   --   --  13.5*  ?NEUTROABS 16.6*  --   --   --   --   ?HGB 8.8*   < > 7.8* 8.2* 8.0*  ?HCT 27.4*   < > 23.0* 24.0* 24.1*  ?MCV 104.6*  --   --   --  98.8  ?PLT 294  --   --   --  261  ? < > = values in this interval not displayed.  ? ?Cardiac Enzymes: ?Recent Labs  ?Lab 10/17/2021 ?1612  ?CKTOTAL 3,462*  ? ?CBG: ?Recent Labs  ?Lab 10/18/2021 ?1557 10/09/2021 ?1637 10/21/2021 ?1938 10/13/2021 ?2324 10/21/21 ?0327  ?GLUCAP 108* 192* 187* 146* 119*  ? ? ?Iron Studies: No results for input(s): IRON, TIBC, TRANSFERRIN, FERRITIN in the last 72 hours. ?Studies/Results: ?MR BRAIN WO CONTRAST ? ?Result Date: 10/21/2021 ?CLINICAL DATA:  81 year old female found unresponsive with seizure like activity. Some loss of gray-white matter differentiation on head CT yesterday. EXAM: MRI HEAD WITHOUT CONTRAST TECHNIQUE: Multiplanar, multiecho pulse sequences of the brain and surrounding structures were obtained without intravenous contrast. COMPARISON:  Head CT  09/24/2021.  Brain MRI 03/24/2018. FINDINGS: Brain: Widespread bilateral cortical and subcortical white matter restricted diffusion (series 5, image 98 and series 6, image 40) affecting bilateral frontal lobes, parietal lobes along the parieto-occipital sulci, and occipital lobes right greater than left (series 5, image 80). Asymmetric Cytotoxic edema in the affected areas. And there is abundant and confluent petechial hemorrhage on SWI (series 14, image 35). The extensive microhemorrhage has also increased in areas that are not clearly abnormal on diffusion today, such as the deep white matter in both hemispheres on series 14, image 36, and the cerebellum on image 17. Where as on 2019 SWI there were much fewer microhemorrhages mostly concentrated in the occipital lobes. Additionally, there is a component of vasogenic edema in the right parietal and occipital lobes which is facilitated on diffusion (series 6, image 37, series 9, image 20) and adjacent to confluent petechial blood. There is no significant intracranial mass effect with stable ventricle size and configuration since 2019. No midline shift. Basilar cisterns remain patent. No IVH. Furthermore there is evidence of restricted diffusion in the bilateral globus pallidus although without obvious T2/FLAIR hyperintensity. Other deep gray matter nuclei appear spared. No acute signal abnormality in the brainstem or cerebellum, although evidence of the chronic right paracentral pontine infarct which was acute in 2019. No evidence of mass lesion, extra-axial collection. Cervicomedullary junction and pituitary are within normal limits. Vascular: Major intracranial vascular flow voids are stable since 2019, with dominant left vertebral artery. Skull and upper cervical spine: Negative. Visualized bone marrow signal is within normal limits. Sinuses/Orbits: Negative. Other: Intubated. Oral enteric tube and small volume retained secretions in the pharynx. Relatively large  and broad-based left posterior convexity scalp hematoma on series 9, image 20. IMPRESSION: 1. Fairly extensive cortical and subcortical infarcts in both hemispheres affecting both the anterior and posterior circulation, with evidence of restricted diffusion also in the bilateral globus pallidus. This pattern is suspicious for Anoxic Injury, although the other deep gray nuclei are spared. 2. Positive also for extensive petechial/microhemorrhage throughout the brain now. This is confluent in the areas of abnormal diffusion (#1), but also substantially progressed elsewhere since a 2019 MRI. Amyloid Angiopathy cannot be excluded. 3. Although there is some vasogenic edema associated with the blood  products in the right parietal and occipital lobes, there is no significant intracranial mass effect or malignant hemorrhagic transformation at this time. 4. Left posterior convexity scalp hematoma. Electronically Signed   By: Genevie Ann M.D.   On: 10/21/2021 06:27  ? ?DG Chest Portable 1 View ? ?Result Date: 09/29/2021 ?CLINICAL DATA:  Intubated. EXAM: PORTABLE CHEST 1 VIEW COMPARISON:  04/27/2020 FINDINGS: Endotracheal tube with the tip 6.3 cm above the carina. Nasogastric tube coursing below the diaphragm. No focal consolidation. No pleural effusion or pneumothorax. Heart and mediastinal contours are unremarkable. No acute osseous abnormality. IMPRESSION: 1. Support lines and tubing in satisfactory position. 2. No acute cardiopulmonary disease. Electronically Signed   By: Kathreen Devoid M.D.   On: 09/30/2021 14:25  ? ?DG Knee Left Port ? ?Result Date: 10/03/2021 ?CLINICAL DATA:  Unwitnessed fall. EXAM: PORTABLE LEFT KNEE - 1-2 VIEW COMPARISON:  None. FINDINGS: The joint spaces are fairly well maintained for age. No acute fracture or osteochondral lesion. There is a small suprapatellar knee joint effusion noted. Moderate vascular calcifications. IMPRESSION: 1. No acute bony findings. 2. Small joint effusion. Electronically Signed    By: Marijo Sanes M.D.   On: 10/17/2021 15:21  ? ?EEG adult ? ?Result Date: 10/14/2021 ?Lora Havens, MD     10/06/2021  9:04 PM Patient Name: [name] MRN: [id number] Epilepsy Attending: Edwena Blow

## 2021-10-21 NOTE — Progress Notes (Signed)
RT note: ?RT and RN transported vent patient to MRI and back. Vital signs stable through out. ?

## 2021-10-21 NOTE — Progress Notes (Signed)
eLink Physician-Brief Progress Note ?Patient Name: Keylin Ferryman ?DOB: 24-Sep-1940 ?MRN: [id number] ? ? ?Date of Service ? 10/21/2021  ?HPI/Events of Note ? Hypotension and tachycardia on dialysis (BP 80/48, HR 122), patient has PIV only.  ?eICU Interventions ? Peripheral Phenylephrine gtt ordered.  ? ? ? ?  ? ?Kerry Kass Latima Hamza ?10/21/2021, 3:05 AM ?

## 2021-10-21 NOTE — Progress Notes (Signed)
An USGPIV (ultrasound guided PIV) has been placed for short-term vasopressor infusion. A correctly placed ivWatch must be used when administering Vasopressors. Should this treatment be needed beyond 72 hours, central line access should be obtained.  It will be the responsibility of the bedside nurse to follow best practice to prevent extravasations.   ?

## 2021-10-22 ENCOUNTER — Inpatient Hospital Stay (HOSPITAL_COMMUNITY): Admission: RE | Admit: 2021-10-22 | Payer: Medicare HMO | Source: Ambulatory Visit

## 2021-10-22 DIAGNOSIS — G9341 Metabolic encephalopathy: Secondary | ICD-10-CM | POA: Diagnosis not present

## 2021-10-22 DIAGNOSIS — N179 Acute kidney failure, unspecified: Secondary | ICD-10-CM | POA: Diagnosis not present

## 2021-10-22 LAB — RENAL FUNCTION PANEL
Albumin: 2.1 g/dL — ABNORMAL LOW (ref 3.5–5.0)
Albumin: 1.9 g/dL — ABNORMAL LOW (ref 3.5–5.0)
Anion gap: 18 — ABNORMAL HIGH (ref 5–15)
Anion gap: 15 (ref 5–15)
BUN: 104 mg/dL — ABNORMAL HIGH (ref 8–23)
BUN: 76 mg/dL — ABNORMAL HIGH (ref 8–23)
CO2: 18 mmol/L — ABNORMAL LOW (ref 22–32)
CO2: 18 mmol/L — ABNORMAL LOW (ref 22–32)
Calcium: 7.9 mg/dL — ABNORMAL LOW (ref 8.9–10.3)
Calcium: 7.9 mg/dL — ABNORMAL LOW (ref 8.9–10.3)
Chloride: 109 mmol/L (ref 98–111)
Chloride: 110 mmol/L (ref 98–111)
Creatinine, Ser: 6.85 mg/dL — ABNORMAL HIGH (ref 0.44–1.00)
Creatinine, Ser: 4.61 mg/dL — ABNORMAL HIGH (ref 0.44–1.00)
GFR, Estimated: 6 mL/min — ABNORMAL LOW (ref >60)
GFR, Estimated: 9 mL/min — ABNORMAL LOW (ref >60)
Glucose, Bld: 154 mg/dL — ABNORMAL HIGH (ref 70–99)
Glucose, Bld: 217 mg/dL — ABNORMAL HIGH (ref 70–99)
Phosphorus: 6.8 mg/dL — ABNORMAL HIGH (ref 2.5–4.6)
Phosphorus: 4.5 mg/dL (ref 2.5–4.6)
Potassium: 4.6 mmol/L (ref 3.5–5.1)
Potassium: 5.3 mmol/L — ABNORMAL HIGH (ref 3.5–5.1)
Sodium: 145 mmol/L (ref 135–145)
Sodium: 143 mmol/L (ref 135–145)

## 2021-10-22 LAB — CBC
HCT: 25.6 % — ABNORMAL LOW (ref 36.0–46.0)
Hemoglobin: 8.6 g/dL — ABNORMAL LOW (ref 12.0–15.0)
MCH: 33 pg (ref 26.0–34.0)
MCHC: 33.6 g/dL (ref 30.0–36.0)
MCV: 98.1 fL (ref 80.0–100.0)
Platelets: 275 10*3/uL (ref 150–400)
RBC: 2.61 MIL/uL — ABNORMAL LOW (ref 3.87–5.11)
RDW: 13.8 % (ref 11.5–15.5)
WBC: 15.4 10*3/uL — ABNORMAL HIGH (ref 4.0–10.5)
nRBC: 0.8 % — ABNORMAL HIGH (ref 0.0–0.2)

## 2021-10-22 LAB — BLOOD CULTURE ID PANEL (REFLEXED) - BCID2

## 2021-10-22 LAB — GLUCOSE, CAPILLARY
Glucose-Capillary: 115 mg/dL — ABNORMAL HIGH (ref 70–99)
Glucose-Capillary: 130 mg/dL — ABNORMAL HIGH (ref 70–99)
Glucose-Capillary: 143 mg/dL — ABNORMAL HIGH (ref 70–99)
Glucose-Capillary: 155 mg/dL — ABNORMAL HIGH (ref 70–99)
Glucose-Capillary: 197 mg/dL — ABNORMAL HIGH (ref 70–99)

## 2021-10-22 LAB — TRIGLYCERIDES: Triglycerides: 320 mg/dL — ABNORMAL HIGH (ref <150)

## 2021-10-22 LAB — HEPATITIS B SURFACE ANTIBODY, QUANTITATIVE: Hep B S AB Quant (Post): <3.1 m[IU]/mL — ABNORMAL LOW (ref >9.9)

## 2021-10-22 LAB — PROCALCITONIN: Procalcitonin: 2.18 ng/mL

## 2021-10-22 LAB — MAGNESIUM: Magnesium: 2.1 mg/dL (ref 1.7–2.4)

## 2021-10-22 MED ORDER — HYDROMORPHONE BOLUS VIA INFUSION
1.0000 mg | INTRAVENOUS | Status: DC | PRN
  Administered 2021-10-22 – 2021-10-23 (×2): 1 mg via INTRAVENOUS

## 2021-10-22 MED ORDER — BIOTENE DRY MOUTH MT LIQD: 15.0000 mL | OROMUCOSAL | Status: DC | PRN

## 2021-10-22 MED ORDER — DARBEPOETIN ALFA 150 MCG/0.3ML IJ SOSY
150.0000 ug | PREFILLED_SYRINGE | INTRAMUSCULAR | Status: DC
  Administered 2021-10-22: 150 ug via SUBCUTANEOUS
  Filled 2021-10-22: qty 0.3

## 2021-10-22 MED ORDER — HALOPERIDOL LACTATE 5 MG/ML IJ SOLN: 0.5000 mg | INTRAMUSCULAR | Status: DC | PRN

## 2021-10-22 MED ORDER — B COMPLEX-C PO TABS
1.0000 | ORAL_TABLET | Freq: Every day | ORAL | Status: DC
  Administered 2021-10-22: 1
  Filled 2021-10-22: qty 1

## 2021-10-22 MED ORDER — SODIUM CHLORIDE 0.9% FLUSH: 10.0000 mL | INTRAVENOUS | Status: DC | PRN

## 2021-10-22 MED ORDER — VITAL 1.5 CAL PO LIQD
1000.0000 mL | ORAL | Status: DC
  Administered 2021-10-22: 1000 mL

## 2021-10-22 MED ORDER — ACETAMINOPHEN 650 MG RE SUPP: 650.0000 mg | Freq: Four times a day (QID) | RECTAL | Status: DC | PRN

## 2021-10-22 MED ORDER — GLYCOPYRROLATE 1 MG PO TABS: 1.0000 mg | ORAL_TABLET | ORAL | Status: DC | PRN

## 2021-10-22 MED ORDER — SODIUM CHLORIDE 0.9 % IV SOLN
2.0000 mg/h | INTRAVENOUS | Status: DC
  Administered 2021-10-22: 2 mg/h via INTRAVENOUS
  Filled 2021-10-22: qty 5

## 2021-10-22 MED ORDER — POLYVINYL ALCOHOL 1.4 % OP SOLN
1.0000 [drp] | Freq: Four times a day (QID) | OPHTHALMIC | Status: DC | PRN
  Filled 2021-10-22: qty 15

## 2021-10-22 MED ORDER — SODIUM CHLORIDE 0.9% FLUSH: 10.0000 mL | Freq: Two times a day (BID) | INTRAVENOUS | Status: DC

## 2021-10-22 MED ORDER — PROSOURCE TF PO LIQD
45.0000 mL | Freq: Two times a day (BID) | ORAL | Status: DC
  Administered 2021-10-22: 45 mL
  Filled 2021-10-22: qty 45

## 2021-10-22 MED ORDER — PROSOURCE TF PO LIQD
90.0000 mL | Freq: Four times a day (QID) | ORAL | Status: DC
  Administered 2021-10-22 (×2): 90 mL
  Filled 2021-10-22 (×2): qty 90

## 2021-10-22 MED ORDER — ACETAMINOPHEN 325 MG PO TABS
650.0000 mg | ORAL_TABLET | Freq: Four times a day (QID) | ORAL | Status: DC | PRN
  Administered 2021-10-23: 650 mg via ORAL
  Filled 2021-10-22: qty 2

## 2021-10-22 MED ORDER — HALOPERIDOL LACTATE 2 MG/ML PO CONC: 0.5000 mg | ORAL | Status: DC | PRN

## 2021-10-22 MED ORDER — ONDANSETRON 4 MG PO TBDP
4.0000 mg | ORAL_TABLET | Freq: Four times a day (QID) | ORAL | Status: DC | PRN
  Filled 2021-10-22: qty 1

## 2021-10-22 MED ORDER — ONDANSETRON HCL 4 MG/2ML IJ SOLN: 4.0000 mg | Freq: Four times a day (QID) | INTRAMUSCULAR | Status: DC | PRN

## 2021-10-22 MED ORDER — MIDAZOLAM HCL 2 MG/2ML IJ SOLN
0.5000 mg | INTRAMUSCULAR | Status: DC | PRN
  Administered 2021-10-22 – 2021-10-23 (×6): 2 mg via INTRAVENOUS
  Filled 2021-10-22 (×5): qty 2

## 2021-10-22 MED ORDER — VITAL HIGH PROTEIN PO LIQD
1000.0000 mL | ORAL | Status: DC
  Administered 2021-10-22: 1000 mL

## 2021-10-22 MED ORDER — HALOPERIDOL 0.5 MG PO TABS: 0.5000 mg | ORAL_TABLET | ORAL | Status: DC | PRN

## 2021-10-22 MED ORDER — DIPHENHYDRAMINE HCL 50 MG/ML IJ SOLN: 12.5000 mg | INTRAMUSCULAR | Status: DC | PRN

## 2021-10-22 MED ORDER — GLYCOPYRROLATE 0.2 MG/ML IJ SOLN: 0.2000 mg | INTRAMUSCULAR | Status: DC | PRN

## 2021-10-22 NOTE — Progress Notes (Signed)
eLink Physician-Brief Progress Note ?Patient Name: [name] ?DOB: September 23, 1940 ?MRN: [id number] ? ? ?Date of Service ? 10/22/2021  ?HPI/Events of Note ? Family requesting discontinuation of CRRT and transition to comfort measures ahead of terminal extubation tomorrow. I confirmed desire with her family at bedside, they also made her a DNR.  ?eICU Interventions ? CRRT discontinued, comfort measures orders entered, DNR order placed in the chart.  ? ? ? ?  ? ?[name] ?10/22/2021, 8:04 PM ?

## 2021-10-22 NOTE — Progress Notes (Signed)
Initial Nutrition Assessment ? ?DOCUMENTATION CODES:  ? ?Not applicable ? ?INTERVENTION:  ? ?Initiate tube feeding via OGT: ?Vital 1.5 at 50 ml/h (1200 ml per day) ?Prosource TF 90 ml QID ? ?Provides 2120 kcal, 169 gm protein, 917 ml free water daily ? ?Add B-complex with vitamin C while on CRRT. ? ?NUTRITION DIAGNOSIS:  ? ?Increased nutrient needs related to acute illness (renal failure requiring CRRT) as evidenced by estimated needs. ? ?GOAL:  ? ?Patient will meet greater than or equal to 90% of their needs ? ?MONITOR:  ? ?Vent status, Labs, TF tolerance, Skin, I & O's, Weight trends ? ?REASON FOR ASSESSMENT:  ? ?Ventilator, Consult ?Assessment of nutrition requirement/status ? ?ASSESSMENT:  ? ?81 yo female admitted after being found unresponsive at home by a neighbor. Intubated on admission. PMH includes CKD stage IV, DM, CAD, HTN, HLD, remote stroke, chronic anemia, macular degeneration, colon cancer, breast cancer, primary TB. ? ?Received MD Consult for TF initiation and management. ?OG tube in place. ?CRRT initiated last night. Keeping even volume status.  ?Per chart review, patient has an AVF for advanced CKD, but had not initiated HD PTA.  ? ?Patient is currently intubated on ventilator support ?MV: 13 L/min ?Temp (24hrs), Avg:98.6 ?F (37 ?C), Min:96.3 ?F (35.7 ?C), Max:100.2 ?F (37.9 ?C) ? ?Propofol: 14 ml/hr providing 370 kcal from lipid ? ?Labs reviewed. Phos 6.8 ?CBG: 115-143 ? ?Medications reviewed and include Aranesp, Novolog, phenylephrine, propofol. ? ?Weight history reviewed. Weight is down by 4% over the past 3 months, not significant for the time frame. Edema could be masking actual weight loss.  ?Patient with deep pitting edema to RUE, moderate pitting edema to LUE, and non-pitting edema to BLE per RN edema assessment.  ? ?NUTRITION - FOCUSED PHYSICAL EXAM: ? ?Unable to complete ? ?Diet Order:   ?Diet Order   ? ?       ?  Diet NPO time specified  Diet effective now       ?  ? ?  ?  ? ?   ? ? ?EDUCATION NEEDS:  ? ?Not appropriate for education at this time ? ?Skin:  Skin Assessment: Skin Integrity Issues: ?Skin Integrity Issues:: DTI ?DTI: sacrum, back, head, L heel ? ?Last BM:  3/28 ? ?Height:  ? ?Ht Readings from Last 1 Encounters:  ?08/21/21 '5\' 7"'$  (1.702 m)  ? ? ?Weight:  ? ?Wt Readings from Last 1 Encounters:  ?10/22/21 79.3 kg  ? ? ? ?BMI:  Body mass index is 27.38 kg/m?. ? ?Estimated Nutritional Needs:  ? ?Kcal:  2000-2200 ? ?Protein:  150-180 gm ? ?Fluid:  2 L ? ? ? ?Lucas Mallow RD, LDN, CNSC ?Please refer to Amion for contact information.                                                       ? ?

## 2021-10-22 NOTE — Progress Notes (Signed)
PHARMACY - PHYSICIAN COMMUNICATION ?CRITICAL VALUE ALERT - BLOOD CULTURE IDENTIFICATION (BCID) ? ?[name] is an 81 y.o. female who presented to [facility] on 10/17/2021 with a chief complaint of seizures, possible anoxic brain injury.  ? ?Assessment:  Klebsiella pneumoniae, ESBL not detected (gene negative).   ? ?Name of physician (or Provider) Contacted: Dr. James Ivanoff ? ?Current antibiotics: Ceftriaxone 2 gm IV every 24 hours. ? ?Changes to prescribed antibiotics recommended:  ?Patient is on recommended antibiotics - No changes needed ? ?Results for orders placed or performed during the hospital encounter of 09/27/2021  ?Blood Culture ID Panel (Reflexed) (Collected: 10/05/2021  3:46 PM)  ?Result Value Ref Range  ? Enterococcus faecalis NOT DETECTED NOT DETECTED  ? Enterococcus Faecium NOT DETECTED NOT DETECTED  ? Listeria monocytogenes NOT DETECTED NOT DETECTED  ? Staphylococcus species DETECTED (A) NOT DETECTED  ? Staphylococcus aureus (BCID) NOT DETECTED NOT DETECTED  ? Staphylococcus epidermidis DETECTED (A) NOT DETECTED  ? Staphylococcus lugdunensis NOT DETECTED NOT DETECTED  ? Streptococcus species NOT DETECTED NOT DETECTED  ? Streptococcus agalactiae NOT DETECTED NOT DETECTED  ? Streptococcus pneumoniae NOT DETECTED NOT DETECTED  ? Streptococcus pyogenes NOT DETECTED NOT DETECTED  ? A.calcoaceticus-baumannii NOT DETECTED NOT DETECTED  ? Bacteroides fragilis NOT DETECTED NOT DETECTED  ? Enterobacterales NOT DETECTED NOT DETECTED  ? Enterobacter cloacae complex NOT DETECTED NOT DETECTED  ? Escherichia coli NOT DETECTED NOT DETECTED  ? Klebsiella aerogenes NOT DETECTED NOT DETECTED  ? Klebsiella oxytoca NOT DETECTED NOT DETECTED  ? Klebsiella pneumoniae NOT DETECTED NOT DETECTED  ? Proteus species NOT DETECTED NOT DETECTED  ? Salmonella species NOT DETECTED NOT DETECTED  ? Serratia marcescens NOT DETECTED NOT DETECTED  ? Haemophilus influenzae NOT DETECTED NOT DETECTED  ? Neisseria meningitidis NOT DETECTED NOT  DETECTED  ? Pseudomonas aeruginosa NOT DETECTED NOT DETECTED  ? Stenotrophomonas maltophilia NOT DETECTED NOT DETECTED  ? Candida albicans NOT DETECTED NOT DETECTED  ? Candida auris NOT DETECTED NOT DETECTED  ? Candida glabrata NOT DETECTED NOT DETECTED  ? Candida krusei NOT DETECTED NOT DETECTED  ? Candida parapsilosis NOT DETECTED NOT DETECTED  ? Candida tropicalis NOT DETECTED NOT DETECTED  ? Cryptococcus neoformans/gattii NOT DETECTED NOT DETECTED  ? Methicillin resistance mecA/C NOT DETECTED NOT DETECTED  ? ?Nicole Cella, RPh ?Clinical Pharmacist ?10/22/2021  6:18 AM ? ?

## 2021-10-22 NOTE — Progress Notes (Addendum)
Pts son [name] and other son and family member at bedside with off going day shift RN, Nettie Elm and night shift RN. ? ?[name] and family aware of current treatments and patient status. Family understand prognosis and is requesting for CRRT to stopped and pt kept comfortable tonight. [name] wishes for pts family who arrived tonight to have time to spend with pt and with plans to transition to comfort tomorrow.  ? ?Paged on call Renal MD with update. ? ?Warren Lacy MD made aware. ? ? ? ?1930: Spoke to Renal MD and aware, ok with stopping CRRT. Ok to not continue with AM labs if family ok with this. Family also agrees to no more lab sticks. ? ?Again updated family at bedside and focus is on comfort tonight while family visiting. Called to speak to [facility] to start a continuous low dose pain medication. ?

## 2021-10-22 NOTE — Progress Notes (Signed)
Just recently had to change CRRT set for filter clotting from consistent access negative pressure. While preforming 10 AM mouth care, patient biting on ETT causing access negative pressure alarm to sound. 50 mg Push of fentanyl given and increased propofol hourly dose. ?

## 2021-10-22 NOTE — Consult Note (Signed)
Reason for Consult:second opinion per family request ?Referring Physician: Erskine Emery ? ?[name] is an 81 y.o. Caucasian female.  With past medical history of hypertension, hyperlipidemia, pontine stroke in 2019, chronic kidney disease stage IV, diabetes who was found unresponsive lying on the floor by her neighbor.  She was last seen normal was several days ago on 10/15/2021.  EMS weakness some jerking on the left side possible seizure activity and gave 2.5 mg of Versed with resolution of the movements.  She was intubated upon arrival in the ER for airway protection and due to low mental status.  She was found to be significantly anemic with renal failure and creatinine of 10.5.  Head CT on admission was remarkable only for questionable white matter hypodensity in the right parietal and occipital white matter.  There is left temporoparietal scalp soft tissue swelling and edema.  MRI scan of the brain done yesterday shows bilateral widespread cortical and subcortical restricted diffusion affecting bilateral frontal lobes, parietal lobes and parieto-occipital regions right side more than left with some asymmetric cytotoxic edema.  There is also bilateral basal ganglia diffusion hyperintensity raising concern for hypoxic injury.  There is also abundant confluent petechial hemorrhage on SWI images including small subarachnoid hemorrhages as well.  The patient had a previous MRI on 03/24/2018 when she had a pontine stroke at that time SWI images had shown very few microhemorrhages in the occipital lobes.  Patient neurological exam remained quite poor and she was comatose unresponsive with daily preserved brainstem reflexes and no voluntary movements noted.  Continuous EEG was obtained which showed diffuse slowing with superimposed focal slowing left greater than right parieto-occipital regions.  There was generalized periodic triphasic's noted.  She underwent hemodialysis and now has been switched to CRRT.  She  became hypotensive requiring pressors. ?Patient was examined by me off sedation.  She has been getting some intermittent fentanyl as she has been biting down and this has been interfering with her CRRT. ?  ? ?Past Medical History:  ?Diagnosis Date  ? Anxiety   ? Back ache   ? Breast cancer ([place])   ? right  ? Bronchitis   ? Cancer of colon ([place])   ? Carotid arterial disease ([place])   ? a. 02/2018 Carotid U/S: 1-39% bilat ICA stenosis.  ? Chronic Dyspnea on exertion   ? a. 08/2017 MV: EF 62%, mid ant/apical ant defect. No ischemia-->low risk; b. 02/2018 Echo: EF 60-65%, no rwma, Gr1 DD. Mild AI. Asc Ao 60m. Triv MR/TR/PR. Nl RV fxn.  ? CKD (chronic kidney disease), stage IV ([place] 2017  ? Depression   ? DM (diabetes mellitus) ([place]   ? Type II  ? Family history of primary TB   ? Fracture of ankle, closed 2014  ? left  ? History of chicken pox   ? History of hysterectomy 04/1980  ? History of primary TB   ? Hyperlipidemia   ? Hypertension   ? Knee pain, right   ? Lung abnormality   ? age [age]patient had pna spot on lungs   ? Microalbuminuria   ? Muscle cramps   ? both legs  ? 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 Nephrolithiasis   ? Palpitations   ? a. 2007 s/p catheter ablation for tachycardia, though pt is not aware of details.  ? Personal history of radiation therapy   ? Stroke ([facility]   ? a. 02/2018 non hemorrhagic 165mR paramedian pontine infarct. Chronic microvascular  ischemia.  ? ? ?Past Surgical History:  ?Procedure Laterality Date  ? ABLATION  2007  ? fast heart rate  ? ANKLE FRACTURE SURGERY Left 2014  ? AV FISTULA PLACEMENT Left 06/11/2021  ? Procedure: LEFT ARM BRACHIOCEPHALIC CREATION;  Surgeon: Broadus John, MD;  Location: [facility] OR;  Service: Vascular;  Laterality: Left;  PERIPHERAL NERVE BLOCK  ? BREAST LUMPECTOMY Right   ? CATARACT EXTRACTION Right 2017  ? colonscopy  2015  ? HYSTEROTOMY    ? 45 years ago  ? TONSILLECTOMY AND ADENOIDECTOMY    ? age 64  ? ? ?Family History  ?Problem Relation Age of Onset  ?  Peripheral Artery Disease Mother 47  ?     HX OF POOR CIRCULATION, SMOKING, LEG AMPUTATION  ? Cancer Father   ?     cancer of spine  ? Asthma Brother 33  ? Kidney Stones Brother   ? Breast cancer Neg Hx   ? Colon cancer Neg Hx   ? Colon polyps Neg Hx   ? ? ?Social History:  reports that she has never smoked. She has never used smokeless tobacco. She reports current alcohol use. She reports that she does not use drugs. ? ?Allergies:  ?Allergies  ?Allergen Reactions  ? Lasix [Furosemide] Other (See Comments)  ? Lisinopril   ?  Other reaction(s): worsening renal function  ? Other   ?  Other reaction(s): bowels loose  ? ? ?Medications: I have reviewed the patient's current medications. ?Prior to Admission:  ?Medications Prior to Admission  ?Medication Sig Dispense Refill Last Dose  ? acetaminophen (TYLENOL) 325 MG tablet Take 2 tablets (650 mg total) by mouth every 4 (four) hours as needed for mild pain (or temp > 37.5 C (99.5 F)).   unknown  ? ARIPiprazole (ABILIFY) 5 MG tablet Take 1 tablet (5 mg total) by mouth daily. 30 tablet 3 10/19/2021  ? atorvastatin (LIPITOR) 80 MG tablet Take 1 tablet (80 mg total) by mouth daily at 6 PM. (Patient taking differently: Take 80 mg by mouth in the morning.) 30 tablet 0 10/19/2021  ? beta carotene w/minerals (OCUVITE) tablet Take 1 tablet by mouth daily.   10/19/2021  ? CALCIUM PO Take 1 tablet by mouth daily.   10/19/2021  ? Cholecalciferol (QC VITAMIN D3) 50 MCG (2000 UT) TABS Take 2,000 Units by mouth in the morning.   10/19/2021  ? clopidogrel (PLAVIX) 75 MG tablet Take 1 tablet (75 mg total) by mouth daily. Restart on 06/13/21 30 tablet 0 10/19/2021  ? furosemide (LASIX) 20 MG tablet Take 20 mg by mouth daily.   10/19/2021  ? HUMALOG MIX 75/25 KWIKPEN (75-25) 100 UNIT/ML KwikPen Inject 44 Units into the skin in the morning and at bedtime.   10/19/2021  ? hydrALAZINE (APRESOLINE) 25 MG tablet Take 25 mg by mouth 3 (three) times daily.   10/19/2021  ? metoprolol succinate (TOPROL-XL) 50  MG 24 hr tablet Take 50 mg by mouth daily.   10/19/2021 at 2000  ? mirtazapine (REMERON) 15 MG tablet Take 1 tablet (15 mg total) by mouth at bedtime. 30 tablet 1 10/19/2021  ? Multiple Vitamin (MULTIVITAMIN WITH MINERALS) TABS tablet Take 1 tablet by mouth in the morning. Centrum Silver Women's Multivitamin   10/19/2021  ? omega-3 acid ethyl esters (LOVAZA) 1 g capsule Take 2 g by mouth 2 (two) times daily.   10/19/2021  ? senna (SENOKOT) 8.6 MG tablet Take 1 tablet by mouth at bedtime.   10/19/2021  ?  venlafaxine XR (EFFEXOR-XR) 150 MG 24 hr capsule Take 150 mg by mouth daily with breakfast.  3 10/19/2021  ? BD PEN NEEDLE NANO 2ND GEN 32G X 4 MM MISC 1 each by Other route See admin instructions. Use with [place] daily.     ? LORazepam (ATIVAN) 0.5 MG tablet Take 1 tablet (0.5 mg total) by mouth every 6 (six) hours as needed for anxiety. (Patient not taking: Reported on 10/02/2021) 14 tablet 0 Not Taking  ? ONETOUCH VERIO test strip 1 each by Other route See admin instructions. Use 1 strip to test blood glucose twice daily.     ? traMADol (ULTRAM) 50 MG tablet Take 1 tablet (50 mg total) by mouth every 6 (six) hours as needed. (Patient not taking: Reported on 10/10/2021) 8 tablet 0 Not Taking  ? ?Scheduled: ? B-complex with vitamin C  1 tablet Per Tube Daily  ? chlorhexidine gluconate (MEDLINE KIT)  15 mL Mouth Rinse BID  ? Chlorhexidine Gluconate Cloth  6 each Topical Q0600  ? darbepoetin (ARANESP) injection - NON-DIALYSIS  150 mcg Subcutaneous Q Thu-1800  ? feeding supplement (PROSource TF)  90 mL Per Tube QID  ? insulin aspart  0-6 Units Subcutaneous Q4H  ? mouth rinse  15 mL Mouth Rinse 10 times per day  ? pantoprazole sodium  40 mg Per Tube Daily  ? ?Continuous: ?  prismasol BGK 4/2.5 300 mL/hr at 10/22/21 0916  ?  prismasol BGK 4/2.5 300 mL/hr at 10/22/21 0916  ? sodium chloride    ? sodium chloride    ? sodium chloride 10 mL/hr at 10/22/21 1300  ? cefTRIAXone (ROCEPHIN)  IV Stopped (10/21/21 1825)  ? feeding  supplement (VITAL 1.5 CAL) 1,000 mL (10/22/21 1209)  ? phenylephrine (NEO-SYNEPHRINE) Adult infusion 280 mcg/min (10/22/21 1300)  ? prismasol BGK 4/2.5 1,500 mL/hr at 10/22/21 1300  ? propofol (DIPRIVAN) infusi

## 2021-10-22 NOTE — Progress Notes (Signed)
Spoke with son again late in evening. ?We will do trial of CRRT.  If no improvement in mental status transition to comfort. ? ?Erskine Emery MD PCCM ?

## 2021-10-22 NOTE — Progress Notes (Signed)
LTM EEG maint complete - no skin breakdown under:  Fp1 Fp2 F7 ?

## 2021-10-22 NOTE — Progress Notes (Signed)
Subjective:  Decision was made last night to start her on CRRT-  got started about 11 PM-  jerking is gone but on propofol- hypothermic but responding to pain  -   has been over 2 liters positive but remains on pressors-  running even with CRRT  ? ?Objective ?Vital signs in last 24 hours: ?Vitals:  ? 10/22/21 0515 10/22/21 0530 10/22/21 0545 10/22/21 0600  ?BP: (!) 100/52 (!) 101/57 (!) 82/45 (!) 100/53  ?Pulse: 75 71 75 75  ?Resp: (!) 28 (!) 28 (!) 28 (!) 28  ?Temp: (!) 96.3 ?F (35.7 ?C) (!) 96.3 ?F (35.7 ?C) (!) 96.4 ?F (35.8 ?C) (!) 96.6 ?F (35.9 ?C)  ?TempSrc:    Bladder  ?SpO2: 100% 100% 100% 100%  ?Weight:      ? ?Weight change: 0 kg ? ?Intake/Output Summary (Last 24 hours) at 10/22/2021 0634 ?Last data filed at 10/22/2021 0600 ?Gross per 24 hour  ?Intake 2779.98 ml  ?Output 353.5 ml  ?Net 2426.48 ml  ? ? ?Assessment/Plan: [name]-  multiple medical problems including advanced CKD-  found down  ?1.Renal- advanced CKD at baseline-  AVF is in place and ready-  last evald by me on 3/16-  no indications-  A on CRF -  could be contributing but not sure completely responsible for condition.  However, I felt that the most efficient way to clear her uremia was with CRRT so we are trying it to see what her MS looks like when uremia cleared. Unfortunately, In light of what MRI showed is unclear if will have significant impact but we will see-  too early to tell this AM-  will inc dialysate flow to 1500 ?2. Hypertension/volume  - not significantly volume overloaded-  no removal with HD-  now 2 liters positive and with a little edema so would not bolus any more ?3. [place]-  k is OK-  all 4 K baths ?4. Anemia  - in the 8's-  was in the 9's when I saw her on 3/16-  will give ESA  ?5.  Unresponsive-   Work up per [employer].  Now with MRI and EEG with not so promising findings.  I offered CRRT to clear uremia faster but not sure if will have big impact on outcome-  trial of that started yesterday  ? ? ?[name]  ? ? ?Labs: ?Basic Metabolic Panel: ?Recent Labs  ?Lab 09/25/2021 ?1320 10/03/2021 ?1327 09/27/2021 ?1402 09/29/2021 ?2107 10/21/21 ?0200 10/22/21 ?0453  ?NA 144 142   < > 142 145 145  ?K 5.0 4.8   < > 4.8 4.7 4.6  ?CL 112* 113*  --   --  111 109  ?CO2 8*  --   --   --  12* 18*  ?GLUCOSE 177* 175*  --   --  143* 154*  ?BUN 150* >130*  --   --  162* 104*  ?CREATININE 9.46* 10.50*  --   --  10.10* 6.85*  ?CALCIUM 8.2*  --   --   --  8.0* 7.9*  ?PHOS  --   --   --   --  10.5* 6.8*  ? < > = values in this interval not displayed.  ? ?Liver Function Tests: ?Recent Labs  ?Lab 10/15/2021 ?1320 10/22/21 ?0453  ?AST 100*  --   ?ALT 65*  --   ?ALKPHOS 97  --   ?BILITOT 1.5*  --   ?PROT 5.2*  --   ?ALBUMIN 2.3*  2.1*  ? ?No results for input(s): LIPASE, AMYLASE in the last 168 hours. ?No results for input(s): AMMONIA in the last 168 hours. ?CBC: ?Recent Labs  ?Lab 10/17/2021 ?1320 10/05/2021 ?1327 09/24/2021 ?2107 10/21/21 ?0200 10/22/21 ?0453  ?WBC 18.6*  --   --  13.5* 15.4*  ?NEUTROABS 16.6*  --   --   --   --   ?HGB 8.8*   < > 8.2* 8.0* 8.6*  ?HCT 27.4*   < > 24.0* 24.1* 25.6*  ?MCV 104.6*  --   --  98.8 98.1  ?PLT 294  --   --  261 275  ? < > = values in this interval not displayed.  ? ?Cardiac Enzymes: ?Recent Labs  ?Lab 09/29/2021 ?1612  ?CKTOTAL 3,462*  ? ?CBG: ?Recent Labs  ?Lab 10/21/21 ?1136 10/21/21 ?1521 10/21/21 ?1934 10/21/21 ?2348 10/22/21 ?0336  ?GLUCAP 129* 146* 144* 130* 115*  ? ? ?Iron Studies: No results for input(s): IRON, TIBC, TRANSFERRIN, FERRITIN in the last 72 hours. ?Studies/Results: ?MR BRAIN WO CONTRAST ? ?Result Date: 10/21/2021 ?CLINICAL DATA:  81 year old female found unresponsive with seizure like activity. Some loss of gray-white matter differentiation on head CT yesterday. EXAM: MRI HEAD WITHOUT CONTRAST TECHNIQUE: Multiplanar, multiecho pulse sequences of the brain and surrounding structures were obtained without intravenous contrast. COMPARISON:  Head CT 10/11/2021.  Brain MRI 03/24/2018.  FINDINGS: Brain: Widespread bilateral cortical and subcortical white matter restricted diffusion (series 5, image 98 and series 6, image 40) affecting bilateral frontal lobes, parietal lobes along the parieto-occipital sulci, and occipital lobes right greater than left (series 5, image 80). Asymmetric Cytotoxic edema in the affected areas. And there is abundant and confluent petechial hemorrhage on SWI (series 14, image 35). The extensive microhemorrhage has also increased in areas that are not clearly abnormal on diffusion today, such as the deep white matter in both hemispheres on series 14, image 36, and the cerebellum on image 17. Where as on 2019 SWI there were much fewer microhemorrhages mostly concentrated in the occipital lobes. Additionally, there is a component of vasogenic edema in the right parietal and occipital lobes which is facilitated on diffusion (series 6, image 37, series 9, image 20) and adjacent to confluent petechial blood. There is no significant intracranial mass effect with stable ventricle size and configuration since 2019. No midline shift. Basilar cisterns remain patent. No IVH. Furthermore there is evidence of restricted diffusion in the bilateral globus pallidus although without obvious T2/FLAIR hyperintensity. Other deep gray matter nuclei appear spared. No acute signal abnormality in the brainstem or cerebellum, although evidence of the chronic right paracentral pontine infarct which was acute in 2019. No evidence of mass lesion, extra-axial collection. Cervicomedullary junction and pituitary are within normal limits. Vascular: Major intracranial vascular flow voids are stable since 2019, with dominant left vertebral artery. Skull and upper cervical spine: Negative. Visualized bone marrow signal is within normal limits. Sinuses/Orbits: Negative. Other: Intubated. Oral enteric tube and small volume retained secretions in the pharynx. Relatively large and broad-based left posterior  convexity scalp hematoma on series 9, image 20. IMPRESSION: 1. Fairly extensive cortical and subcortical infarcts in both hemispheres affecting both the anterior and posterior circulation, with evidence of restricted diffusion also in the bilateral globus pallidus. This pattern is suspicious for Anoxic Injury, although the other deep gray nuclei are spared. 2. Positive also for extensive petechial/microhemorrhage throughout the brain now. This is confluent in the areas of abnormal diffusion (#1), but also substantially progressed elsewhere since a 2019 MRI.  Amyloid Angiopathy cannot be excluded. 3. Although there is some vasogenic edema associated with the blood products in the right parietal and occipital lobes, there is no significant intracranial mass effect or malignant hemorrhagic transformation at this time. 4. Left posterior convexity scalp hematoma. Electronically Signed   By: Genevie Ann M.D.   On: 10/21/2021 06:27  ? ?DG CHEST PORT 1 VIEW ? ?Result Date: 10/21/2021 ?CLINICAL DATA:  Check central line placement EXAM: PORTABLE CHEST 1 VIEW COMPARISON:  10/04/2021 FINDINGS: Endotracheal tube and gastric catheter are again seen and stable. New right jugular temporary dialysis catheter is noted in the mid superior vena cava. No pneumothorax is seen. No focal infiltrate or bony abnormality is noted. IMPRESSION: No pneumothorax following right jugular central line placement. Electronically Signed   By: Inez Catalina M.D.   On: 10/21/2021 23:03  ? ?DG Chest Portable 1 View ? ?Result Date: 10/11/2021 ?CLINICAL DATA:  Intubated. EXAM: PORTABLE CHEST 1 VIEW COMPARISON:  04/27/2020 FINDINGS: Endotracheal tube with the tip 6.3 cm above the carina. Nasogastric tube coursing below the diaphragm. No focal consolidation. No pleural effusion or pneumothorax. Heart and mediastinal contours are unremarkable. No acute osseous abnormality. IMPRESSION: 1. Support lines and tubing in satisfactory position. 2. No acute cardiopulmonary  disease. Electronically Signed   By: Kathreen Devoid M.D.   On: 10/09/2021 14:25  ? ?DG Knee Left Port ? ?Result Date: 10/05/2021 ?CLINICAL DATA:  Unwitnessed fall. EXAM: PORTABLE LEFT KNEE - 1-2 VIEW COMPARISON:  None. FINDINGS: Th

## 2021-10-22 NOTE — Procedures (Addendum)
Patient Name: [name]  ?MRN: [id number]  ?Epilepsy Attending: Lora Havens  ?Referring Physician/Provider: Donnetta Simpers, MD ?Duration: 10/21/2021 2149 to 10/22/2021 2149 ?  ?Patient history: 81 year old woman with a history of carotid artery stenosis, CKD stage IV, diabetes, hypertension, hyperlipidemia, remote pontine stroke who was found unresponsive by a neighbor today. She did have reported focal motor seizure activity on L side en route with EMS. EEG to evaluate for seizure ?  ?Level of alertness: comatose ?  ?AEDs during EEG study: Propofol ?  ?Technical aspects: This EEG study was done with scalp electrodes positioned according to the 10-20 International system of electrode placement. Electrical activity was acquired at a sampling rate of '500Hz'$  and reviewed with a high frequency filter of '70Hz'$  and a low frequency filter of '1Hz'$ . EEG data were recorded continuously and digitally stored.  ?  ?Description: EEG showed near continuous generalized 3 to 6 Hz theta-delta slowing admixed with brief 2-3 seconds of generalized eeg attenuation. Hyperventilation and photic stimulation were not performed.   ?  ?ABNORMALITY ?- Continuous slow, generalized  ?  ?IMPRESSION: ?This study is suggestive of severe diffuse encephalopathy, nonspecific etiology.  No seizures or definite epileptiform discharges were seen during the study. ? ?Lora Havens  ?

## 2021-10-22 NOTE — Progress Notes (Signed)
? ? ?NAME:  [name], MRN:  [id number], DOB:  [birth year], LOS: 2 ?ADMISSION DATE:  10/19/2021,  ?CONSULTATION DATE:  10/03/2021 ?REFERRING MD:  Dr. Roslynn Amble, CHIEF COMPLAINT:  Unresponsive  ? ?History of Present Illness:  ?HPI obtained from medical chart review as patient is intubated and sedated on mechanical ventilation. ? ?81 year old female with prior medical history significant for but not limited to CKD stage IV, HTN, HLD, DMT2, and stroke who was found unresponsive at home by neighbor with snoring respirations and dried emesis; was normotensive, CBG 173, and pulse ox 98% on NRB.  Last seen on 3/23 without specific complaints. ?Witnessed seizure like activity on her left side enroute to ER which was treated with 2.'5mg'$  of versed by EMS with resolution.  On arrival to ER, she was intubated for airway protection given mental status.  Labs pending but significant thus far for istat> K 4.8, BUN> 130, sCr 10.50, iCa 1.03, Hgb 7.8 (baseline 7-8's).  Imaging pending.  EKG NSR with no acute findings, with prolonged Qtc.  PCCM called for admit.  Nephrology consulted in ER.  ? ?Pertinent  Medical History  ?CKD stage IV (s/p left brachiocephalic fistula on 76/16/07- cleared for use 08/21/21), CAD, DMT2, HLD, HTN, R pontine stroke 2019, chronic anemia, macular degeneration, depression, anxiety, colon cancer, breast cancer, primary TB ? ?Significant Hospital Events: ?Including procedures, antibiotic start and stop dates in addition to other pertinent events   ?3/28 > admitted, intubated for airway protection, AKI ?3/29 Brief HD overnight. MRI concerning for anoxic brain injury, cortical and subcortical infarcts in both hemispheres, and extensive petechial/microhemorrhage.  CRRT begun ? ?Interim History / Subjective:  ?No events. ?CRRT started last night after discussion with son. ? ?Objective   ?Blood pressure 102/82, pulse 83, temperature (!) 97.5 ?F (36.4 ?C), resp. rate (!) 28, weight 79.3 kg, SpO2 100 %. ?   ?Vent Mode:  PRVC ?FiO2 (%):  [40 %] 40 % ?Set Rate:  [28 bmp] 28 bmp ?Vt Set:  [490 mL] 490 mL ?PEEP:  [5 cmH20] 5 cmH20 ?Plateau Pressure:  [14 cmH20-15 cmH20] 15 cmH20  ? ?Intake/Output Summary (Last 24 hours) at 10/22/2021 0810 ?Last data filed at 10/22/2021 0800 ?Gross per 24 hour  ?Intake 2754.66 ml  ?Output 468.5 ml  ?Net 2286.16 ml  ? ? ?Filed Weights  ? 10/21/21 0200 10/21/21 0430 10/22/21 0500  ?Weight: 79.3 kg 79.3 kg 79.3 kg  ? ? ?Examination: ?No distress ?Withdraws to pain ?Not following commands ?No clonus while on prop ?Pupils equal ? ?Labs ?BUN improving ?CBC stable ? ?Resolved Hospital Problem list   ? ? 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  Family wants to be absolutely sure there is no chance for recovery before we transition to comfort.  We will have Dr. Hortense Ramal review case as well. ?MRI diffuse injury and hemorrhagic conversion- will hold heparin for now, okay to use with CRRT ?AKI on CKD- on CRRT ?Shock- echo preserved EF, mild shock, suspected volume down on admission, stable ?Anemia- stable ? ?Best Practice (right click and "Reselect all SmartList Selections" daily)  ? ?Diet/type: start TF ?DVT prophylaxis: SCD ?GI prophylaxis: PPI ?Lines: N/A ?Foley:  N/A ?Code Status:  full code ?Last date of multidisciplinary goals of care discussion: Please seen sperate Plan of Care Note written 3/28 ? ?Critical care time: 33 minutes  ? ?Erskine Emery MD PCCM ?[facility]  ?10/22/2021 , 8:10  AM ? ?Please see [email or web address] for pager details ? ?If no response, please call [phone] ?After hours, please call Elink at [phone] ? ? ? ? ? ? ? ? ?

## 2021-10-22 NOTE — TOC Progression Note (Signed)
Transition of Care (TOC) - Progression Note  ? ? ?Patient Details  ?Name: Sonnia Strong ?MRN: [id number] ?Date of Birth: 1941/04/23 ? ?Transition of Care (TOC) CM/SW Contact  ?Angelita Ingles, RN ?Phone [phone] ? ?10/22/2021, 2:49 PM ? ?Clinical Narrative:    ? ?Transition of Care (TOC) Screening Note ? ? ?Patient Details  ?Name: Genessis Flanary ?Date of Birth: 1941/05/05 ? ? ?Transition of Care (TOC) CM/SW Contact:    ?Angelita Ingles, RN ?Phone Number: ?10/22/2021, 2:50 PM ? ? ? ?Transition of Care Department [facility]) has reviewed patient and no TOC needs have been identified at this time. We will continue to monitor patient advancement through interdisciplinary progression rounds.  ? ? ? ? ?  ?  ? ?Expected Discharge Plan and Services ?  ?  ?  ?  ?  ?                ?  ?  ?  ?  ?  ?  ?  ?  ?  ?  ? ? ?Social Determinants of Health (SDOH) Interventions ?  ? ?Readmission Risk Interventions ?   ? View : No data to display.  ?  ?  ?  ? ? ?

## 2021-10-23 DIAGNOSIS — N179 Acute kidney failure, unspecified: Secondary | ICD-10-CM | POA: Diagnosis not present

## 2021-10-23 DIAGNOSIS — R579 Shock, unspecified: Secondary | ICD-10-CM | POA: Diagnosis not present

## 2021-10-23 DIAGNOSIS — J9601 Acute respiratory failure with hypoxia: Secondary | ICD-10-CM | POA: Diagnosis not present

## 2021-10-23 DIAGNOSIS — G9341 Metabolic encephalopathy: Secondary | ICD-10-CM | POA: Diagnosis not present

## 2021-10-23 MED ORDER — HALOPERIDOL LACTATE 2 MG/ML PO CONC: 0.5000 mg | ORAL | Status: DC | PRN

## 2021-10-23 MED ORDER — ONDANSETRON HCL 4 MG/2ML IJ SOLN: 4.0000 mg | Freq: Four times a day (QID) | INTRAMUSCULAR | Status: DC | PRN

## 2021-10-23 MED ORDER — GLYCOPYRROLATE 0.2 MG/ML IJ SOLN: 0.2000 mg | INTRAMUSCULAR | Status: DC | PRN

## 2021-10-23 MED ORDER — SODIUM CHLORIDE 0.9 % IV SOLN
2.0000 mg/h | INTRAVENOUS | Status: DC
  Filled 2021-10-23 (×3): qty 5

## 2021-10-23 MED ORDER — ACETAMINOPHEN 650 MG RE SUPP: 650.0000 mg | Freq: Four times a day (QID) | RECTAL | Status: DC | PRN

## 2021-10-23 MED ORDER — ACETAMINOPHEN 160 MG/5ML PO SOLN: 650.0000 mg | Freq: Four times a day (QID) | ORAL | Status: DC | PRN

## 2021-10-23 MED ORDER — HALOPERIDOL LACTATE 5 MG/ML IJ SOLN: 0.5000 mg | INTRAMUSCULAR | Status: DC | PRN

## 2021-10-23 MED ORDER — MIDAZOLAM-SODIUM CHLORIDE 100-0.9 MG/100ML-% IV SOLN
0.5000 mg/h | INTRAVENOUS | Status: DC
  Administered 2021-10-23: 0.5 mg/h via INTRAVENOUS
  Filled 2021-10-23: qty 100

## 2021-10-23 MED ORDER — GLYCOPYRROLATE 1 MG PO TABS: 1.0000 mg | ORAL_TABLET | ORAL | Status: DC | PRN

## 2021-10-23 MED ORDER — ACETAMINOPHEN 325 MG PO TABS: 650.0000 mg | ORAL_TABLET | Freq: Four times a day (QID) | ORAL | Status: DC | PRN

## 2021-10-23 MED ORDER — HALOPERIDOL 0.5 MG PO TABS: 0.5000 mg | ORAL_TABLET | ORAL | Status: DC | PRN

## 2021-10-23 MED ORDER — ONDANSETRON HCL 4 MG PO TABS: 4.0000 mg | ORAL_TABLET | Freq: Four times a day (QID) | ORAL | Status: DC | PRN

## 2021-10-24 LAB — CULTURE, BLOOD (ROUTINE X 2): Special Requests: ADEQUATE

## 2021-10-24 NOTE — Progress Notes (Signed)
Chaplain visited with [name]'s sons [name] and [name] and her nephew [name] at pt's bedside. Chaplain asked open ended questions to facilitate story telling and emotional expression. Pt's sons shared what a gift their mother has been in her life. They were increasingly aware of her advancing age and that life is short, but she has been independent and engaged, so this shift still feels shocking and sudden to them. Chaplain engaged family in life review and provided reflective listening as they shared stories about [name]'s life and family. The family requested prayer, which chaplain provided in the family's religious tradition.  ? ?Please page as further needs arise. ? ?Donald Prose. Elyn Peers, M.Div. [employer] ?Chaplain ?Pager [phone] ?Office [phone] ? ?

## 2021-10-24 NOTE — Significant Event (Signed)
Notified by Shea Stakes, bedside RN, that family is ready to proceed with compassionate extubation. Spoke with family who was at bedside, who confirms this.  ?-Extubation order placed ?-Comfort measures ? ?Mitzi Hansen, MD ?

## 2021-10-24 NOTE — Progress Notes (Signed)
I see there is plan to transition to comfort care-  CRRT has been stopped.  Renal will sign off -  call with questions  ? ?Louis Meckel ?  ? ?

## 2021-10-24 NOTE — Progress Notes (Signed)
Pt extubated to comfort care at this time.

## 2021-10-24 NOTE — Procedures (Signed)
Patient Name: [name]  ?MRN: [id number]  ?Epilepsy Attending: Lora Havens  ?Referring Physician/Provider: Donnetta Simpers, MD ?Duration: 10/22/2021 2149 to 15-Nov-2021 7494 ?  ?Patient history: 81 year old woman with a history of carotid artery stenosis, CKD stage IV, diabetes, hypertension, hyperlipidemia, remote pontine stroke who was found unresponsive by a neighbor today. She did have reported focal motor seizure activity on L side en route with EMS. EEG to evaluate for seizure ?  ?Level of alertness: comatose ?  ?AEDs during EEG study: None ?  ?Technical aspects: This EEG study was done with scalp electrodes positioned according to the 10-20 International system of electrode placement. Electrical activity was acquired at a sampling rate of '500Hz'$  and reviewed with a high frequency filter of '70Hz'$  and a low frequency filter of '1Hz'$ . EEG data were recorded continuously and digitally stored.  ?  ?Description: EEG showed near continuous generalized 3 to 6 Hz theta-delta slowing which at times appears sharply contoured with triphasic morphology.  Additionally EEG showed brief 2-3 seconds of generalized eeg attenuation. Hyperventilation and photic stimulation were not performed.   ?  ?ABNORMALITY ?- Continuous slow, generalized  ?  ?IMPRESSION: ?This study is suggestive of severe diffuse encephalopathy, nonspecific etiology.  No seizures or definite epileptiform discharges were seen during the study. ?  ?Lora Havens  ?

## 2021-10-24 NOTE — Progress Notes (Signed)
10 cc dilaudid wasted with Neta Mends RN ?

## 2021-10-24 NOTE — Progress Notes (Signed)
Per family request, turned neo to half dose in preparation for comfort care. Chaplain to be paged for support. ?

## 2021-10-24 NOTE — Progress Notes (Signed)
LTM EEG discontinued - no skin breakdown at unhook.   

## 2021-10-24 NOTE — Progress Notes (Signed)
? ? ?NAME:  [name], MRN:  [id number], DOB:  04/04/1941, LOS: 3 ?ADMISSION DATE:  10/10/2021,  ?CONSULTATION DATE:  10/07/2021 ?REFERRING MD:  Dr. Dykstra, CHIEF COMPLAINT:  Unresponsive  ? ?History of Present Illness:  ?HPI obtained from medical chart review as patient is intubated and sedated on mechanical ventilation. ? ?81 year old female with prior medical history significant for but not limited to CKD stage IV, HTN, HLD, DMT2, and stroke who was found unresponsive at home by neighbor with snoring respirations and dried emesis; was normotensive, CBG 173, and pulse ox 98% on NRB.  Last seen on 3/23 without specific complaints. ?Witnessed seizure like activity on her left side enroute to ER which was treated with 2.5mg of versed by EMS with resolution.  On arrival to ER, she was intubated for airway protection given mental status.  Labs pending but significan for istat> K 4.8, BUN> 130, sCr 10.50, iCa 1.03, Hgb 7.8 (baseline 7-8's).  Imaging pending.  EKG NSR with no acute findings, with prolonged Qtc.  PCCM called for admit.  Nephrology consulted in ER.  ? 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 ?Significant Hospital Events: ?Including procedures, antibiotic start and stop dates in addition to other pertinent events   ?3/28 > admitted, intubated for airway protection, AKI ?3/29 Brief HD overnight. MRI concerning for anoxic brain injury, cortical and subcortical infarcts in both hemispheres, and extensive petechial/microhemorrhage.  CRRT begun ? ? ?Scheduled Meds: ? B-complex with vitamin C  1 tablet Per Tube Daily  ? chlorhexidine gluconate (MEDLINE KIT)  15 mL Mouth Rinse BID  ? Chlorhexidine Gluconate Cloth  6 each Topical Q0600  ? darbepoetin (ARANESP) injection - NON-DIALYSIS  150 mcg  Subcutaneous Q Thu-1800  ? feeding supplement (PROSource TF)  90 mL Per Tube QID  ? insulin aspart  0-6 Units Subcutaneous Q4H  ? mouth rinse  15 mL Mouth Rinse 10 times per day  ? pantoprazole sodium  40 mg Per Tube Daily  ? sodium chloride flush  10-40 mL Intracatheter Q12H  ? ?Continuous Infusions: ? sodium chloride    ? sodium chloride    ? sodium chloride 10 mL/hr at 10/01/2021 0600  ? cefTRIAXone (ROCEPHIN)  IV Stopped (10/22/21 1626)  ? feeding supplement (VITAL 1.5 CAL) Stopped (10/22/21 1900)  ? HYDROmorphone 2 mg/hr (10/05/2021 0600)  ? phenylephrine (NEO-SYNEPHRINE) Adult infusion 260 mcg/min (10/16/2021 0600)  ? ?PRN Meds:.sodium chloride, sodium chloride, acetaminophen **OR** acetaminophen (TYLENOL) oral liquid 160 mg/5 mL **OR** acetaminophen, alteplase, antiseptic oral rinse, diphenhydrAMINE, docusate, etomidate, fentaNYL (SUBLIMAZE) injection, fentaNYL (SUBLIMAZE) injection, glycopyrrolate **OR** glycopyrrolate **OR** glycopyrrolate, haloperidol **OR** haloperidol **OR** haloperidol lactate, heparin, HYDROmorphone, lidocaine (PF), lidocaine-prilocaine, midazolam, ondansetron **OR** ondansetron (ZOFRAN) IV, pentafluoroprop-tetrafluoroeth, polyethylene glycol, polyvinyl alcohol, rocuronium, sodium chloride flush  ?Interim History / Subjective:  ?Pt made DNR/comfort  care last night but still on neo drip and fm coming in for terminal wean today per nursing ? ?Objective   ?Blood pressure (!) 109/96, pulse 74, temperature 100 ?F (37.8 ?C), resp. rate (!) 28, height 5' 7" (1.702 m), weight 79.3 kg, SpO2 100 %. ?   ?Vent Mode: PRVC ?FiO2 (%):  [40 %] 40 % ?Set Rate:  [28 bmp] 28 bmp ?Vt Set:  [490 mL] 490 mL ?PEEP:  [5 cmH20] 5 cmH20 ?Plateau Pressure:  [8 cmH20-20   cmH20] 8 cmH20  ? ?Intake/Output Summary (Last 24 hours) at 2021-10-24 0745 ?Last data filed at 10/24/2021 0600 ?Gross per 24 hour  ?Intake 1768.37 ml  ?Output 988 ml  ?Net 780.37 ml  ? ?Filed Weights  ? 10/21/21 0200 10/21/21 0430 10/22/21 0500   ?Weight: 79.3 kg 79.3 kg 79.3 kg  ? ? ?Examination: ?Tmax 100.4  ?General appearance:    sedated on vent   ? No jvd ?Oropharynx clear,  mucosa nl ?Neck supple ?Lungs with a few scattered exp > insp rhonchi bilaterally and marked air trapping on vent  ?RRR no s3 or or sign murmur ?Abd obese with limited  excursion  ?Extr warm with no edema or clubbing noted ?Neuro  Sensorium sedated on dilaudid with twitching L shoulder/arm ?  ?Cxr: not done since 3/29  ? ?Resolved Hospital Problem list   ? ? 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   CRRT now off at fm request  ? ?MRI diffuse injury and hemorrhagic conversion ?-  see Neuro note 3/30 > fm decided on comfort care based on neuro prognosis ?  ?AKI on CKD- ?- CRRT d/c'd 3/30 at fm request ? ?Shock- echo preserved EF / still on neo drip with marked air trapping on vent  ?>>> reduce RR to eliminate air trapping / wean off neo if possible  ? ?Acute vent dep resp failure with air trapping  ?>>> reduce RR   ? ?Anemia  ?Lab Results  ?Component Value Date  ? HGB 8.6 (L) 10/22/2021  ? HGB 8.0 (L) 10/21/2021  ? HGB 8.2 (L) 09/27/2021  ?  ? ?Best Practice (right click and "Reselect all SmartList Selections" daily)  ? ?N/a for w/d care today  ? ? ?Christinia Gully, MD ?Pulmonary and Critical Care Medicine ?[place] ?Cell [phone]  ? ?After 7:00 pm call Elink  [phone]  ? ? ? ? ? ? ? ? ?

## 2021-10-24 NOTE — Progress Notes (Signed)
Patient son exited room to tell me him and his family were leaving. Entered patients room with Neta Mends RN, patient was asystole on monitor. No heart or lung sounds for one full min by both RN's. Pupils nonreactive. No visual chest rise. Declared at 1620. ?

## 2021-10-24 DEATH — deceased

## 2021-10-25 LAB — CULTURE, BLOOD (ROUTINE X 2)
Culture: NO GROWTH
Special Requests: ADEQUATE

## 2021-11-05 ENCOUNTER — Encounter (HOSPITAL_COMMUNITY): Payer: Medicare HMO

## 2021-11-23 NOTE — Discharge Summary (Signed)
Physician Discharge Summary  ? ?  ? ? ? ? ?Patient ID: ?[name] ?MRN: [id number] ?DOB/AGE: January 01, 1941 81 y.o. ? ?Admit date: 10/06/2021 ?Discharge date:  10-31-2021  ? ?Discharge Diagnoses:   ? ?Active Hospital Problems  ? Diagnosis Date Noted  ? Acute renal failure (ARF) ([place]) 09/23/2021  ? Acute respiratory failure with hypoxia ([place])   ? Acute metabolic encephalopathy   ? Shock ([place])   ?  ? ? ?Discharge summary   ?81 year old female with prior medical history significant for but not limited to CKD stage IV, HTN, HLD, DMT2, and stroke who was found 3/28 pm unresponsive at home by neighbor with snoring respirations and dried emesis; was normotensive, CBG 173, and pulse ox 98% on NRB.  Last seen on 3/23 without specific complaints. Witnessed seizure like activity on her left side enroute to ER which was treated with 2.'5mg'$  of versed by EMS with resolution.  On arrival to ER, she was intubated for airway protection given mental status.  Labs  significant for istat> K 4.8, BUN> 130, sCr 10.50, iCa 1.03, Hgb 7.8 (baseline 7-8's).  Imaging pending.  EKG NSR with no acute findings, with prolonged Qtc.  PCCM called for admit.  Nephrology consulted in 3/28   ? ?3/29 Brief HD overnight. MRI concerning for anoxic brain injury, cortical and subcortical infarcts in both hemispheres, and extensive petechial/microhemorrhage.  CRRT begun ?  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   CRRT stopped  at fm request  ?  ?MRI diffuse injury and hemorrhagic conversion ?-  see Neuro note 3/30 > fm decided on comfort care based on neuro prognosis ?  ?AKI on CKD- ?- CRRT d/c'd 3/30 at fm request ?  ?Shock- echo preserved EF / still on neo drip with marked air trapping on vent  ?>>> reduce RR to eliminate air trapping / wean off neo if possible  ?  ?Acute vent dep resp failure with air trapping  ?>>> reduce RR   ?  ? ?Family elected to shift to comfort care as full NCB and  pt extubated to comfort care as per request and expired October 31, 2021 at 1620. ? ? ?Christinia Gully, MD ?Pulmonary and Critical Care Medicine ?[place] ? ?  ? ? ?
